# Patient Record
Sex: Male | Born: 1955 | Race: Black or African American | Hispanic: No | Marital: Single | Smoking: Former smoker
Health system: Southern US, Community
[De-identification: ages and names within clinical notes are randomized; demographics above are authoritative.]

## PROBLEM LIST (undated history)

## (undated) DIAGNOSIS — E871 Hypo-osmolality and hyponatremia: Secondary | ICD-10-CM

## (undated) DIAGNOSIS — I10 Essential (primary) hypertension: Secondary | ICD-10-CM

## (undated) DIAGNOSIS — H5789 Other specified disorders of eye and adnexa: Secondary | ICD-10-CM

## (undated) DIAGNOSIS — I639 Cerebral infarction, unspecified: Secondary | ICD-10-CM

## (undated) DIAGNOSIS — E785 Hyperlipidemia, unspecified: Secondary | ICD-10-CM

## (undated) DIAGNOSIS — F1920 Other psychoactive substance dependence, uncomplicated: Secondary | ICD-10-CM

## (undated) DIAGNOSIS — N179 Acute kidney failure, unspecified: Secondary | ICD-10-CM

## (undated) HISTORY — DX: Hypo-osmolality and hyponatremia: E87.1

## (undated) HISTORY — DX: Other psychoactive substance dependence, uncomplicated: F19.20

## (undated) HISTORY — PX: APPENDECTOMY: SHX54

## (undated) HISTORY — DX: Hyperlipidemia, unspecified: E78.5

## (undated) HISTORY — DX: Other specified disorders of eye and adnexa: H57.89

## (undated) HISTORY — DX: Essential (primary) hypertension: I10

## (undated) HISTORY — PX: OTHER SURGICAL HISTORY: SHX169

## (undated) HISTORY — DX: Acute kidney failure, unspecified: N17.9

---

## 2016-04-21 ENCOUNTER — Emergency Department (HOSPITAL_BASED_OUTPATIENT_CLINIC_OR_DEPARTMENT_OTHER)
Admission: EM | Admit: 2016-04-21 | Discharge: 2016-04-21 | Disposition: A | Discharge status: Home / Self Care | Payer: 59 | Attending: Emergency Medicine | Admitting: Emergency Medicine

## 2016-04-21 ENCOUNTER — Encounter (HOSPITAL_BASED_OUTPATIENT_CLINIC_OR_DEPARTMENT_OTHER): Payer: Self-pay

## 2016-04-21 DIAGNOSIS — Y9301 Activity, walking, marching and hiking: Secondary | ICD-10-CM | POA: Insufficient documentation

## 2016-04-21 DIAGNOSIS — R030 Elevated blood-pressure reading, without diagnosis of hypertension: Secondary | ICD-10-CM | POA: Diagnosis not present

## 2016-04-21 DIAGNOSIS — W228XXA Striking against or struck by other objects, initial encounter: Secondary | ICD-10-CM | POA: Insufficient documentation

## 2016-04-21 DIAGNOSIS — Y999 Unspecified external cause status: Secondary | ICD-10-CM | POA: Insufficient documentation

## 2016-04-21 DIAGNOSIS — F17200 Nicotine dependence, unspecified, uncomplicated: Secondary | ICD-10-CM | POA: Insufficient documentation

## 2016-04-21 DIAGNOSIS — S01111A Laceration without foreign body of right eyelid and periocular area, initial encounter: Secondary | ICD-10-CM | POA: Diagnosis not present

## 2016-04-21 DIAGNOSIS — S0591XA Unspecified injury of right eye and orbit, initial encounter: Secondary | ICD-10-CM | POA: Diagnosis present

## 2016-04-21 DIAGNOSIS — Y9289 Other specified places as the place of occurrence of the external cause: Secondary | ICD-10-CM | POA: Insufficient documentation

## 2016-04-21 MED ORDER — TETANUS-DIPHTH-ACELL PERTUSSIS 5-2.5-18.5 LF-MCG/0.5 IM SUSP
0.5000 mL | Freq: Once | INTRAMUSCULAR | Status: AC
  Administered 2016-04-21: 0.5 mL via INTRAMUSCULAR
  Filled 2016-04-21: qty 0.5

## 2016-04-21 NOTE — ED Notes (Signed)
Pt states he hit right eye on basement ceiling 6/18-appeared healed until approx 1 week ago when he had swelling return and d/c-NAD-steady gait

## 2016-04-21 NOTE — Discharge Instructions (Signed)
Laceration Care, Adult Wash the wound daily with soap and water and place a thin layer of bacitracin ointment over the wound. Signs of infection include redness, pain, fever. Return or see your doctor if you feel that your condition is worsening. Your blood pressure should be rechecked within the next 3 weeks. Today's was elevated at 150/108. A laceration is a cut that goes through all of the layers of the skin and into the tissue that is right under the skin. Some lacerations heal on their own. Others need to be closed with stitches (sutures), staples, skin adhesive strips, or skin glue. Proper laceration care minimizes the risk of infection and helps the laceration to heal better. HOW TO CARE FOR YOUR LACERATION If sutures or staples were used:  Keep the wound clean and dry.  If you were given a bandage (dressing), you should change it at least one time per day or as told by your health care provider. You should also change it if it becomes wet or dirty.  Keep the wound completely dry for the first 24 hours or as told by your health care provider. After that time, you may shower or bathe. However, make sure that the wound is not soaked in water until after the sutures or staples have been removed.  Clean the wound one time each day or as told by your health care provider:  Wash the wound with soap and water.  Rinse the wound with water to remove all soap.  Pat the wound dry with a clean towel. Do not rub the wound.  After cleaning the wound, apply a thin layer of antibiotic ointmentas told by your health care provider. This will help to prevent infection and keep the dressing from sticking to the wound.  Have the sutures or staples removed as told by your health care provider. If skin adhesive strips were used:  Keep the wound clean and dry.  If you were given a bandage (dressing), you should change it at least one time per day or as told by your health care provider. You should also  change it if it becomes dirty or wet.  Do not get the skin adhesive strips wet. You may shower or bathe, but be careful to keep the wound dry.  If the wound gets wet, pat it dry with a clean towel. Do not rub the wound.  Skin adhesive strips fall off on their own. You may trim the strips as the wound heals. Do not remove skin adhesive strips that are still stuck to the wound. They will fall off in time. If skin glue was used:  Try to keep the wound dry, but you may briefly wet it in the shower or bath. Do not soak the wound in water, such as by swimming.  After you have showered or bathed, gently pat the wound dry with a clean towel. Do not rub the wound.  Do not do any activities that will make you sweat heavily until the skin glue has fallen off on its own.  Do not apply liquid, cream, or ointment medicine to the wound while the skin glue is in place. Using those may loosen the film before the wound has healed.  If you were given a bandage (dressing), you should change it at least one time per day or as told by your health care provider. You should also change it if it becomes dirty or wet.  If a dressing is placed over the wound, be careful  not to apply tape directly over the skin glue. Doing that may cause the glue to be pulled off before the wound has healed.  Do not pick at the glue. The skin glue usually remains in place for 5-10 days, then it falls off of the skin. General Instructions  Take over-the-counter and prescription medicines only as told by your health care provider.  If you were prescribed an antibiotic medicine or ointment, take or apply it as told by your doctor. Do not stop using it even if your condition improves.  To help prevent scarring, make sure to cover your wound with sunscreen whenever you are outside after stitches are removed, after adhesive strips are removed, or when glue remains in place and the wound is healed. Make sure to wear a sunscreen of at least  30 SPF.  Do not scratch or pick at the wound.  Keep all follow-up visits as told by your health care provider. This is important.  Check your wound every day for signs of infection. Watch for:  Redness, swelling, or pain.  Fluid, blood, or pus.  Raise (elevate) the injured area above the level of your heart while you are sitting or lying down, if possible. SEEK MEDICAL CARE IF:  You received a tetanus shot and you have swelling, severe pain, redness, or bleeding at the injection site.  You have a fever.  A wound that was closed breaks open.  You notice a bad smell coming from your wound or your dressing.  You notice something coming out of the wound, such as wood or glass.  Your pain is not controlled with medicine.  You have increased redness, swelling, or pain at the site of your wound.  You have fluid, blood, or pus coming from your wound.  You notice a change in the color of your skin near your wound.  You need to change the dressing frequently due to fluid, blood, or pus draining from the wound.  You develop a new rash.  You develop numbness around the wound. SEEK IMMEDIATE MEDICAL CARE IF:  You develop severe swelling around the wound.  Your pain suddenly increases and is severe.  You develop painful lumps near the wound or on skin that is anywhere on your body.  You have a red streak going away from your wound.  The wound is on your hand or foot and you cannot properly move a finger or toe.  The wound is on your hand or foot and you notice that your fingers or toes look pale or bluish.   This information is not intended to replace advice given to you by your health care provider. Make sure you discuss any questions you have with your health care provider.   Document Released: 10/02/2005 Document Revised: 02/16/2015 Document Reviewed: 09/28/2014 Elsevier Interactive Patient Education [employer].

## 2016-04-21 NOTE — ED Provider Notes (Signed)
CSN: [id number]     Arrival date & time 04/21/16  1905 History  By signing my name below, I, Dustin Golden, attest that this documentation has been prepared under the direction and in the presence of Orlie Dakin, MD.  Electronically Signed: Reola Golden, ED Scribe. 04/21/2016. 7:37 PM.   Chief Complaint  Patient presents with  . Eye Injury   The history is provided by the patient. No language interpreter was used.   HPI Comments: Dustin Golden is a 60 y.o. male who wears corrective lenses with no pertinent PMHx who presents to the Emergency Department complaining of gradual onset, gradually worsening, intermittent right swelling At right eyebrow at site of laceration onset 1 week PTA. Pt reports that he struck his right eyebrow on on his basement sheet rock ceiling while walking down a flight of stairs on 04/02/16 (approximately 19 days PTA), creating a laceration which was Steri-Stripped by his companion. No LOC. He sustained a wound at the time of the injury, however it has since healed. Pt has associated intermittent brown discharge and itchiness to the site of the injury. He states that the injury to his eyebrow appeared to be healing until ~1 week prior to coming into the ED when he noticed that the swelling to his eyebrow began to worsen. He states that his eyebrow swelling is worse in the morning just after he wakes up, and gradually improves throughout the day. He has applied hydrogen peroxide and applied ice to the area intermittently withPartial relief of his symptoms. He denies eye pain or visual disturbance. Tetanus is not UTD.  History reviewed. No pertinent past medical history. Past Surgical History  Procedure Laterality Date  . Appendectomy    . Gastric ulcer surgery     No family history on file. Social History  Substance Use Topics  . Smoking status: Current Every Day Smoker  . Smokeless tobacco: None  . Alcohol Use: Yes     Comment: weekly    Review of  Systems  Constitutional: Negative.   HENT: Negative.   Eyes: Positive for discharge (brown, right) and itching (right). Negative for pain and visual disturbance.  Respiratory: Negative.   Cardiovascular: Negative.   Gastrointestinal: Negative.   Musculoskeletal: Negative.   Skin: Negative.   Neurological: Negative.   Psychiatric/Behavioral: Negative.    Allergies  Ibuprofen and Sulfa antibiotics  Home Medications   Prior to Admission medications   Not on File   BP 188/121 mmHg  Pulse 93  Temp(Src) 98.9 F (37.2 C) (Oral)  Resp 18  Ht 6\' 4"  (1.93 m)  Wt 210 lb (95.255 kg)  BMI 25.57 kg/m2  SpO2 98%   Physical Exam  Constitutional: He appears well-developed and well-nourished. No distress.  HENT:  Mild swelling at right eyebrow and right upper eyelid. No tenderness. No redness. No warmth. Otherwise atraumatic  Eyes: Conjunctivae are normal. Pupils are equal, round, and reactive to light.  Neck: Neck supple. No tracheal deviation present. No thyromegaly present.  Cardiovascular: Normal rate and regular rhythm.   No murmur heard. Pulmonary/Chest: Effort normal and breath sounds normal.  Abdominal: Soft. Bowel sounds are normal. He exhibits no distension. There is no tenderness.  Musculoskeletal: Normal range of motion. He exhibits no edema or tenderness.  Neurological: He is alert. Coordination normal.  Skin: Skin is warm and dry. No rash noted.  Psychiatric: He has a normal mood and affect.  Nursing note and vitals reviewed.  ED Course  Procedures (including critical  care time)  DIAGNOSTIC STUDIES: Oxygen Saturation is 98% on RA, normal by my interpretation.   COORDINATION OF CARE: 7:36 PM-Discussed next steps with pt including at home care and icing. Pt is agreeable with the plan.   MDM   Final diagnoses:  None   Visual acuity: 20/25 on right, left, and both eyes No signs of infection.. Wound appears to be well-healing Plan local wound care blood pressure  recheck 3 weeks Diagnosis aspiration to right eyebrow #3 elevated blood pressure  I personally performed the services described in this documentation, which was scribed in my presence. The recorded information has been reviewed and considered.    Orlie Dakin, MD 04/21/16 [phone]

## 2019-06-04 DIAGNOSIS — L089 Local infection of the skin and subcutaneous tissue, unspecified: Secondary | ICD-10-CM | POA: Insufficient documentation

## 2019-06-04 DIAGNOSIS — B958 Unspecified staphylococcus as the cause of diseases classified elsewhere: Secondary | ICD-10-CM | POA: Insufficient documentation

## 2019-06-04 DIAGNOSIS — S62617A Displaced fracture of proximal phalanx of left little finger, initial encounter for closed fracture: Secondary | ICD-10-CM | POA: Insufficient documentation

## 2020-12-13 ENCOUNTER — Inpatient Hospital Stay (HOSPITAL_COMMUNITY): Payer: Commercial Managed Care - PPO

## 2020-12-13 ENCOUNTER — Emergency Department (HOSPITAL_COMMUNITY): Payer: Commercial Managed Care - PPO

## 2020-12-13 ENCOUNTER — Emergency Department (HOSPITAL_COMMUNITY): Payer: Commercial Managed Care - PPO | Admitting: Anesthesiology

## 2020-12-13 ENCOUNTER — Inpatient Hospital Stay (HOSPITAL_COMMUNITY)
Admission: EM | Admit: 2020-12-13 | Discharge: 2020-12-17 | DRG: 024 | Disposition: A | Discharge status: Rehabilitation Facility | Payer: Commercial Managed Care - PPO | Attending: Neurology | Admitting: Neurology

## 2020-12-13 ENCOUNTER — Encounter (HOSPITAL_COMMUNITY)
Admission: EM | Disposition: A | Discharge status: Rehabilitation Facility | Payer: Self-pay | Source: Home / Self Care | Attending: Neurology

## 2020-12-13 DIAGNOSIS — R471 Dysarthria and anarthria: Secondary | ICD-10-CM | POA: Diagnosis present

## 2020-12-13 DIAGNOSIS — R29722 NIHSS score 22: Secondary | ICD-10-CM | POA: Diagnosis present

## 2020-12-13 DIAGNOSIS — I69351 Hemiplegia and hemiparesis following cerebral infarction affecting right dominant side: Secondary | ICD-10-CM | POA: Diagnosis not present

## 2020-12-13 DIAGNOSIS — I63412 Cerebral infarction due to embolism of left middle cerebral artery: Principal | ICD-10-CM | POA: Diagnosis present

## 2020-12-13 DIAGNOSIS — I639 Cerebral infarction, unspecified: Secondary | ICD-10-CM | POA: Diagnosis present

## 2020-12-13 DIAGNOSIS — R4701 Aphasia: Secondary | ICD-10-CM | POA: Diagnosis present

## 2020-12-13 DIAGNOSIS — R7303 Prediabetes: Secondary | ICD-10-CM | POA: Diagnosis present

## 2020-12-13 DIAGNOSIS — Z8711 Personal history of peptic ulcer disease: Secondary | ICD-10-CM

## 2020-12-13 DIAGNOSIS — Z882 Allergy status to sulfonamides status: Secondary | ICD-10-CM

## 2020-12-13 DIAGNOSIS — G931 Anoxic brain damage, not elsewhere classified: Secondary | ICD-10-CM | POA: Diagnosis present

## 2020-12-13 DIAGNOSIS — F1410 Cocaine abuse, uncomplicated: Secondary | ICD-10-CM | POA: Diagnosis present

## 2020-12-13 DIAGNOSIS — I5042 Chronic combined systolic (congestive) and diastolic (congestive) heart failure: Secondary | ICD-10-CM | POA: Diagnosis present

## 2020-12-13 DIAGNOSIS — Z20822 Contact with and (suspected) exposure to covid-19: Secondary | ICD-10-CM | POA: Diagnosis present

## 2020-12-13 DIAGNOSIS — I63512 Cerebral infarction due to unspecified occlusion or stenosis of left middle cerebral artery: Secondary | ICD-10-CM | POA: Diagnosis not present

## 2020-12-13 DIAGNOSIS — N179 Acute kidney failure, unspecified: Secondary | ICD-10-CM | POA: Diagnosis present

## 2020-12-13 DIAGNOSIS — S0003XA Contusion of scalp, initial encounter: Secondary | ICD-10-CM | POA: Diagnosis not present

## 2020-12-13 DIAGNOSIS — I161 Hypertensive emergency: Secondary | ICD-10-CM | POA: Diagnosis present

## 2020-12-13 DIAGNOSIS — F1010 Alcohol abuse, uncomplicated: Secondary | ICD-10-CM | POA: Diagnosis present

## 2020-12-13 DIAGNOSIS — I674 Hypertensive encephalopathy: Secondary | ICD-10-CM | POA: Diagnosis present

## 2020-12-13 DIAGNOSIS — I6523 Occlusion and stenosis of bilateral carotid arteries: Secondary | ICD-10-CM | POA: Diagnosis present

## 2020-12-13 DIAGNOSIS — S80211A Abrasion, right knee, initial encounter: Secondary | ICD-10-CM | POA: Diagnosis not present

## 2020-12-13 DIAGNOSIS — F1920 Other psychoactive substance dependence, uncomplicated: Secondary | ICD-10-CM | POA: Diagnosis not present

## 2020-12-13 DIAGNOSIS — R29710 NIHSS score 10: Secondary | ICD-10-CM | POA: Diagnosis not present

## 2020-12-13 DIAGNOSIS — E871 Hypo-osmolality and hyponatremia: Secondary | ICD-10-CM | POA: Diagnosis not present

## 2020-12-13 DIAGNOSIS — I160 Hypertensive urgency: Secondary | ICD-10-CM | POA: Diagnosis present

## 2020-12-13 DIAGNOSIS — W1839XA Other fall on same level, initial encounter: Secondary | ICD-10-CM | POA: Diagnosis not present

## 2020-12-13 DIAGNOSIS — Z7982 Long term (current) use of aspirin: Secondary | ICD-10-CM

## 2020-12-13 DIAGNOSIS — Z886 Allergy status to analgesic agent status: Secondary | ICD-10-CM

## 2020-12-13 DIAGNOSIS — I110 Hypertensive heart disease with heart failure: Secondary | ICD-10-CM | POA: Diagnosis present

## 2020-12-13 DIAGNOSIS — G8191 Hemiplegia, unspecified affecting right dominant side: Secondary | ICD-10-CM | POA: Diagnosis present

## 2020-12-13 DIAGNOSIS — R29810 Facial weakness: Secondary | ICD-10-CM | POA: Diagnosis present

## 2020-12-13 DIAGNOSIS — E785 Hyperlipidemia, unspecified: Secondary | ICD-10-CM | POA: Diagnosis present

## 2020-12-13 DIAGNOSIS — H5789 Other specified disorders of eye and adnexa: Secondary | ICD-10-CM | POA: Diagnosis not present

## 2020-12-13 DIAGNOSIS — R069 Unspecified abnormalities of breathing: Secondary | ICD-10-CM

## 2020-12-13 DIAGNOSIS — R451 Restlessness and agitation: Secondary | ICD-10-CM | POA: Diagnosis present

## 2020-12-13 DIAGNOSIS — F17210 Nicotine dependence, cigarettes, uncomplicated: Secondary | ICD-10-CM | POA: Diagnosis present

## 2020-12-13 DIAGNOSIS — I6300 Cerebral infarction due to thrombosis of unspecified precerebral artery: Secondary | ICD-10-CM | POA: Diagnosis not present

## 2020-12-13 DIAGNOSIS — F1210 Cannabis abuse, uncomplicated: Secondary | ICD-10-CM | POA: Diagnosis present

## 2020-12-13 DIAGNOSIS — Y900 Blood alcohol level of less than 20 mg/100 ml: Secondary | ICD-10-CM | POA: Diagnosis present

## 2020-12-13 DIAGNOSIS — R718 Other abnormality of red blood cells: Secondary | ICD-10-CM | POA: Diagnosis present

## 2020-12-13 DIAGNOSIS — R1313 Dysphagia, pharyngeal phase: Secondary | ICD-10-CM | POA: Diagnosis present

## 2020-12-13 DIAGNOSIS — I6389 Other cerebral infarction: Secondary | ICD-10-CM | POA: Diagnosis not present

## 2020-12-13 DIAGNOSIS — G460 Middle cerebral artery syndrome: Secondary | ICD-10-CM | POA: Diagnosis present

## 2020-12-13 DIAGNOSIS — I10 Essential (primary) hypertension: Secondary | ICD-10-CM | POA: Diagnosis not present

## 2020-12-13 DIAGNOSIS — Y92230 Patient room in hospital as the place of occurrence of the external cause: Secondary | ICD-10-CM | POA: Diagnosis not present

## 2020-12-13 HISTORY — PX: IR CT HEAD LTD: IMG2386

## 2020-12-13 HISTORY — DX: Essential (primary) hypertension: I10

## 2020-12-13 HISTORY — PX: RADIOLOGY WITH ANESTHESIA: SHX6223

## 2020-12-13 HISTORY — PX: IR PERCUTANEOUS ART THROMBECTOMY/INFUSION INTRACRANIAL INC DIAG ANGIO: IMG6087

## 2020-12-13 HISTORY — PX: IR US GUIDE VASC ACCESS RIGHT: IMG2390

## 2020-12-13 LAB — COMPREHENSIVE METABOLIC PANEL
ALT: 16 U/L (ref 0–44)
AST: 22 U/L (ref 15–41)
Albumin: 4 g/dL (ref 3.5–5.0)
Alkaline Phosphatase: 75 U/L (ref 38–126)
Anion gap: 10 (ref 5–15)
BUN: 17 mg/dL (ref 8–23)
CO2: 22 mmol/L (ref 22–32)
Calcium: 9.2 mg/dL (ref 8.9–10.3)
Chloride: 107 mmol/L (ref 98–111)
Creatinine, Ser: 1.28 mg/dL — ABNORMAL HIGH (ref 0.61–1.24)
GFR, Estimated: >60 mL/min (ref >60)
Glucose, Bld: 117 mg/dL — ABNORMAL HIGH (ref 70–99)
Potassium: 4 mmol/L (ref 3.5–5.1)
Sodium: 139 mmol/L (ref 135–145)
Total Bilirubin: 0.8 mg/dL (ref 0.3–1.2)
Total Protein: 7.2 g/dL (ref 6.5–8.1)

## 2020-12-13 LAB — I-STAT CHEM 8, ED
BUN: 18 mg/dL (ref 8–23)
Calcium, Ion: 1.19 mmol/L (ref 1.15–1.40)
Chloride: 106 mmol/L (ref 98–111)
Creatinine, Ser: 1.3 mg/dL — ABNORMAL HIGH (ref 0.61–1.24)
Glucose, Bld: 115 mg/dL — ABNORMAL HIGH (ref 70–99)
HCT: 44 % (ref 39.0–52.0)
Hemoglobin: 15 g/dL (ref 13.0–17.0)
Potassium: 4 mmol/L (ref 3.5–5.1)
Sodium: 142 mmol/L (ref 135–145)
TCO2: 22 mmol/L (ref 22–32)

## 2020-12-13 LAB — URINALYSIS, ROUTINE W REFLEX MICROSCOPIC
Bilirubin Urine: NEGATIVE
Glucose, UA: NEGATIVE mg/dL
Hgb urine dipstick: NEGATIVE
Ketones, ur: NEGATIVE mg/dL
Leukocytes,Ua: NEGATIVE
Nitrite: NEGATIVE
Protein, ur: NEGATIVE mg/dL
Specific Gravity, Urine: 1.019 (ref 1.005–1.030)
pH: 7 (ref 5.0–8.0)

## 2020-12-13 LAB — RPR: RPR Ser Ql: NONREACTIVE

## 2020-12-13 LAB — ECHOCARDIOGRAM COMPLETE
Area-P 1/2: 3.21 cm2
Calc EF: 34.4 %
S' Lateral: 4.5 cm
Single Plane A2C EF: 41.9 %
Single Plane A4C EF: 26.1 %
Weight: 3114.66 oz

## 2020-12-13 LAB — DIFFERENTIAL
Abs Immature Granulocytes: 0.01 10*3/uL (ref 0.00–0.07)
Basophils Absolute: 0.1 10*3/uL (ref 0.0–0.1)
Basophils Relative: 1 %
Eosinophils Absolute: 0.1 10*3/uL (ref 0.0–0.5)
Eosinophils Relative: 1 %
Immature Granulocytes: 0 %
Lymphocytes Relative: 37 %
Lymphs Abs: 3 10*3/uL (ref 0.7–4.0)
Monocytes Absolute: 0.5 10*3/uL (ref 0.1–1.0)
Monocytes Relative: 7 %
Neutro Abs: 4.4 10*3/uL (ref 1.7–7.7)
Neutrophils Relative %: 54 %

## 2020-12-13 LAB — RESP PANEL BY RT-PCR (FLU A&B, COVID) ARPGX2
Influenza A by PCR: NEGATIVE
Influenza B by PCR: NEGATIVE
SARS Coronavirus 2 by RT PCR: NEGATIVE

## 2020-12-13 LAB — HEMOGLOBIN A1C
Hgb A1c MFr Bld: 6.8 % — ABNORMAL HIGH (ref 4.8–5.6)
Mean Plasma Glucose: 148.46 mg/dL

## 2020-12-13 LAB — CBC
HCT: 43.3 % (ref 39.0–52.0)
Hemoglobin: 14.4 g/dL (ref 13.0–17.0)
MCH: 26.2 pg (ref 26.0–34.0)
MCHC: 33.3 g/dL (ref 30.0–36.0)
MCV: 78.9 fL — ABNORMAL LOW (ref 80.0–100.0)
Platelets: 213 10*3/uL (ref 150–400)
RBC: 5.49 MIL/uL (ref 4.22–5.81)
RDW: 15.1 % (ref 11.5–15.5)
WBC: 8.1 10*3/uL (ref 4.0–10.5)
nRBC: 0 % (ref 0.0–0.2)

## 2020-12-13 LAB — LIPID PANEL
Cholesterol: 179 mg/dL (ref 0–200)
HDL: 54 mg/dL (ref >40)
LDL Cholesterol: 109 mg/dL — ABNORMAL HIGH (ref 0–99)
Total CHOL/HDL Ratio: 3.3 RATIO
Triglycerides: 81 mg/dL (ref <150)
VLDL: 16 mg/dL (ref 0–40)

## 2020-12-13 LAB — GLUCOSE, CAPILLARY
Glucose-Capillary: 137 mg/dL — ABNORMAL HIGH (ref 70–99)
Glucose-Capillary: 99 mg/dL (ref 70–99)
Glucose-Capillary: 97 mg/dL (ref 70–99)
Glucose-Capillary: 116 mg/dL — ABNORMAL HIGH (ref 70–99)
Glucose-Capillary: 89 mg/dL (ref 70–99)

## 2020-12-13 LAB — TSH: TSH: 2.834 u[IU]/mL (ref 0.350–4.500)

## 2020-12-13 LAB — RAPID URINE DRUG SCREEN, HOSP PERFORMED
Amphetamines: NOT DETECTED
Barbiturates: NOT DETECTED
Benzodiazepines: NOT DETECTED
Cocaine: POSITIVE — AB
Opiates: NOT DETECTED
Tetrahydrocannabinol: POSITIVE — AB

## 2020-12-13 LAB — APTT: aPTT: 28 seconds (ref 24–36)

## 2020-12-13 LAB — MRSA PCR SCREENING: MRSA by PCR: NEGATIVE

## 2020-12-13 LAB — PROTIME-INR
INR: 0.9 (ref 0.8–1.2)
Prothrombin Time: 12.2 seconds (ref 11.4–15.2)

## 2020-12-13 LAB — ETHANOL: Alcohol, Ethyl (B): <10 mg/dL (ref <10)

## 2020-12-13 LAB — SEDIMENTATION RATE: Sed Rate: 7 mm/hr (ref 0–16)

## 2020-12-13 LAB — HIV ANTIBODY (ROUTINE TESTING W REFLEX): HIV Screen 4th Generation wRfx: NONREACTIVE

## 2020-12-13 LAB — CBG MONITORING, ED: Glucose-Capillary: 117 mg/dL — ABNORMAL HIGH (ref 70–99)

## 2020-12-13 LAB — AMMONIA: Ammonia: 21 umol/L (ref 9–35)

## 2020-12-13 SURGERY — IR WITH ANESTHESIA
Anesthesia: General

## 2020-12-13 MED ORDER — ACETAMINOPHEN 160 MG/5ML PO SOLN: 650.0000 mg | ORAL | Status: DC | PRN

## 2020-12-13 MED ORDER — FOLIC ACID 5 MG/ML IJ SOLN
1.0000 mg | Freq: Every day | INTRAMUSCULAR | Status: DC
  Administered 2020-12-13: 1 mg via INTRAVENOUS
  Filled 2020-12-13 (×3): qty 0.2

## 2020-12-13 MED ORDER — FENTANYL CITRATE (PF) 100 MCG/2ML IJ SOLN
INTRAMUSCULAR | Status: DC | PRN
  Administered 2020-12-13 (×3): 25 ug via INTRAVENOUS

## 2020-12-13 MED ORDER — CLEVIDIPINE BUTYRATE 0.5 MG/ML IV EMUL
INTRAVENOUS | Status: AC
  Filled 2020-12-13: qty 50

## 2020-12-13 MED ORDER — SENNOSIDES-DOCUSATE SODIUM 8.6-50 MG PO TABS: 1.0000 | ORAL_TABLET | Freq: Every evening | ORAL | Status: DC | PRN

## 2020-12-13 MED ORDER — PHENYLEPHRINE HCL-NACL 10-0.9 MG/250ML-% IV SOLN
INTRAVENOUS | Status: DC | PRN
  Administered 2020-12-13: 40 ug/min via INTRAVENOUS

## 2020-12-13 MED ORDER — DOCUSATE SODIUM 100 MG PO CAPS: 100.0000 mg | ORAL_CAPSULE | Freq: Two times a day (BID) | ORAL | Status: DC | PRN

## 2020-12-13 MED ORDER — SODIUM CHLORIDE 0.9% FLUSH
3.0000 mL | Freq: Once | INTRAVENOUS | Status: DC
Start: 2020-12-13 — End: 2020-12-17

## 2020-12-13 MED ORDER — SODIUM CHLORIDE 0.9 % IV SOLN: INTRAVENOUS | Status: DC

## 2020-12-13 MED ORDER — ACETAMINOPHEN 325 MG PO TABS: 650.0000 mg | ORAL_TABLET | ORAL | Status: DC | PRN

## 2020-12-13 MED ORDER — PERFLUTREN LIPID MICROSPHERE
1.0000 mL | INTRAVENOUS | Status: AC | PRN
  Administered 2020-12-13: 2 mL via INTRAVENOUS
  Filled 2020-12-13: qty 10

## 2020-12-13 MED ORDER — CLEVIDIPINE BUTYRATE 0.5 MG/ML IV EMUL
INTRAVENOUS | Status: DC | PRN
  Administered 2020-12-13: 2 mg/h via INTRAVENOUS

## 2020-12-13 MED ORDER — ADULT MULTIVITAMIN W/MINERALS CH
1.0000 | ORAL_TABLET | Freq: Every day | ORAL | Status: DC
  Administered 2020-12-14 – 2020-12-17 (×4): 1 via ORAL
  Filled 2020-12-13 (×4): qty 1

## 2020-12-13 MED ORDER — EPTIFIBATIDE 20 MG/10ML IV SOLN
INTRAVENOUS | Status: AC
  Filled 2020-12-13: qty 10

## 2020-12-13 MED ORDER — FOLIC ACID 1 MG PO TABS
1.0000 mg | ORAL_TABLET | Freq: Every day | ORAL | Status: DC
  Administered 2020-12-14 – 2020-12-17 (×4): 1 mg via ORAL
  Filled 2020-12-13 (×4): qty 1

## 2020-12-13 MED ORDER — VERAPAMIL HCL 2.5 MG/ML IV SOLN
INTRAVENOUS | Status: AC
  Filled 2020-12-13: qty 2

## 2020-12-13 MED ORDER — THIAMINE HCL 100 MG/ML IJ SOLN
100.0000 mg | Freq: Every day | INTRAMUSCULAR | Status: DC
  Administered 2020-12-13: 100 mg via INTRAVENOUS
  Filled 2020-12-13: qty 2

## 2020-12-13 MED ORDER — IOHEXOL 300 MG/ML  SOLN
50.0000 mL | Freq: Once | INTRAMUSCULAR | Status: AC | PRN
  Administered 2020-12-13: 5 mL via INTRA_ARTERIAL

## 2020-12-13 MED ORDER — PHENYLEPHRINE HCL (PRESSORS) 10 MG/ML IV SOLN
INTRAVENOUS | Status: DC | PRN
  Administered 2020-12-13 (×5): 40 ug via INTRAVENOUS

## 2020-12-13 MED ORDER — THIAMINE HCL 100 MG PO TABS: 100.0000 mg | ORAL_TABLET | Freq: Every day | ORAL | Status: DC

## 2020-12-13 MED ORDER — ROCURONIUM BROMIDE 10 MG/ML (PF) SYRINGE
PREFILLED_SYRINGE | INTRAVENOUS | Status: DC | PRN
  Administered 2020-12-13: 50 mg via INTRAVENOUS
  Administered 2020-12-13 (×2): 10 mg via INTRAVENOUS

## 2020-12-13 MED ORDER — FENTANYL CITRATE (PF) 100 MCG/2ML IJ SOLN
INTRAMUSCULAR | Status: AC
  Filled 2020-12-13: qty 2

## 2020-12-13 MED ORDER — POLYETHYLENE GLYCOL 3350 17 G PO PACK: 17.0000 g | PACK | Freq: Every day | ORAL | Status: DC | PRN

## 2020-12-13 MED ORDER — THIAMINE HCL 100 MG PO TABS
100.0000 mg | ORAL_TABLET | Freq: Every day | ORAL | Status: DC
  Administered 2020-12-14 – 2020-12-17 (×4): 100 mg via ORAL
  Filled 2020-12-13 (×4): qty 1

## 2020-12-13 MED ORDER — ACETAMINOPHEN 650 MG RE SUPP: 650.0000 mg | RECTAL | Status: DC | PRN

## 2020-12-13 MED ORDER — CANGRELOR TETRASODIUM 50 MG IV SOLR
INTRAVENOUS | Status: AC
  Filled 2020-12-13: qty 50

## 2020-12-13 MED ORDER — PANTOPRAZOLE SODIUM 40 MG IV SOLR
40.0000 mg | Freq: Every day | INTRAVENOUS | Status: DC
  Administered 2020-12-13 – 2020-12-14 (×2): 40 mg via INTRAVENOUS
  Filled 2020-12-13 (×2): qty 40

## 2020-12-13 MED ORDER — ACETAMINOPHEN 325 MG PO TABS
650.0000 mg | ORAL_TABLET | ORAL | Status: DC | PRN
  Administered 2020-12-15 – 2020-12-16 (×3): 650 mg via ORAL
  Filled 2020-12-13 (×3): qty 2

## 2020-12-13 MED ORDER — SODIUM CHLORIDE 0.9 % IV SOLN: INTRAVENOUS | Status: DC | PRN

## 2020-12-13 MED ORDER — SUGAMMADEX SODIUM 200 MG/2ML IV SOLN
INTRAVENOUS | Status: DC | PRN
  Administered 2020-12-13: 200 mg via INTRAVENOUS

## 2020-12-13 MED ORDER — CLEVIDIPINE BUTYRATE 0.5 MG/ML IV EMUL
0.0000 mg/h | INTRAVENOUS | Status: DC
  Administered 2020-12-13: 4 mg/h via INTRAVENOUS
  Administered 2020-12-13: 2 mg/h via INTRAVENOUS
  Administered 2020-12-13: 7 mg/h via INTRAVENOUS
  Administered 2020-12-13 (×2): 5 mg/h via INTRAVENOUS
  Administered 2020-12-14: 4 mg/h via INTRAVENOUS
  Filled 2020-12-13 (×8): qty 50

## 2020-12-13 MED ORDER — INSULIN ASPART 100 UNIT/ML ~~LOC~~ SOLN
0.0000 [IU] | SUBCUTANEOUS | Status: DC
  Administered 2020-12-15: 7 [IU] via SUBCUTANEOUS
  Administered 2020-12-16: 1 [IU] via SUBCUTANEOUS

## 2020-12-13 MED ORDER — DEXMEDETOMIDINE HCL IN NACL 400 MCG/100ML IV SOLN
0.4000 ug/kg/h | INTRAVENOUS | Status: DC
  Administered 2020-12-13 (×2): 0.4 ug/kg/h via INTRAVENOUS
  Filled 2020-12-13 (×3): qty 100

## 2020-12-13 MED ORDER — IOHEXOL 240 MG/ML SOLN
150.0000 mL | Freq: Once | INTRAMUSCULAR | Status: AC | PRN
  Administered 2020-12-13: 95 mL via INTRA_ARTERIAL

## 2020-12-13 MED ORDER — STROKE: EARLY STAGES OF RECOVERY BOOK
Freq: Once | Status: DC
  Filled 2020-12-13 (×2): qty 1

## 2020-12-13 MED ORDER — FOLIC ACID 1 MG PO TABS: 1.0000 mg | ORAL_TABLET | Freq: Every day | ORAL | Status: DC

## 2020-12-13 MED ORDER — SUCCINYLCHOLINE CHLORIDE 20 MG/ML IJ SOLN
INTRAMUSCULAR | Status: DC | PRN
  Administered 2020-12-13: 120 mg via INTRAVENOUS

## 2020-12-13 MED ORDER — CHLORHEXIDINE GLUCONATE CLOTH 2 % EX PADS
6.0000 | MEDICATED_PAD | Freq: Every day | CUTANEOUS | Status: DC
  Administered 2020-12-14 – 2020-12-17 (×5): 6 via TOPICAL

## 2020-12-13 MED ORDER — IOHEXOL 350 MG/ML SOLN
100.0000 mL | Freq: Once | INTRAVENOUS | Status: AC | PRN
  Administered 2020-12-13: 100 mL via INTRAVENOUS

## 2020-12-13 MED ORDER — IOHEXOL 240 MG/ML SOLN
INTRAMUSCULAR | Status: AC
  Filled 2020-12-13: qty 200

## 2020-12-13 MED ORDER — LORAZEPAM 2 MG/ML IJ SOLN
1.0000 mg | INTRAMUSCULAR | Status: DC | PRN
  Administered 2020-12-13: 2 mg via INTRAVENOUS
  Filled 2020-12-13: qty 1

## 2020-12-13 MED ORDER — SODIUM CHLORIDE 0.9 % IV SOLN
2.0000 ug/kg/min | INTRAVENOUS | Status: DC
  Administered 2020-12-13 – 2020-12-14 (×8): 2 ug/kg/min via INTRAVENOUS
  Filled 2020-12-13 (×14): qty 50

## 2020-12-13 MED ORDER — PROPOFOL 10 MG/ML IV BOLUS
INTRAVENOUS | Status: DC | PRN
  Administered 2020-12-13: 50 mg via INTRAVENOUS
  Administered 2020-12-13: 40 mg via INTRAVENOUS
  Administered 2020-12-13: 150 mg via INTRAVENOUS

## 2020-12-13 MED ORDER — NITROGLYCERIN 1 MG/10 ML FOR IR/CATH LAB
INTRA_ARTERIAL | Status: AC
  Filled 2020-12-13: qty 10

## 2020-12-13 MED ORDER — CANGRELOR BOLUS VIA INFUSION
30.0000 ug/kg | Freq: Once | INTRAVENOUS | Status: AC
  Administered 2020-12-13: 2700 ug via INTRAVENOUS
  Filled 2020-12-13: qty 2700

## 2020-12-13 NOTE — Anesthesia Procedure Notes (Signed)
Arterial Line Insertion Start/End2/28/2022 2:53 AM, 12/13/2020 2:55 AM Performed by: Effie Berkshire, MD, anesthesiologist  Patient location: Pre-op. Preanesthetic checklist: patient identified, IV checked, site marked, risks and benefits discussed, surgical consent, monitors and equipment checked, pre-op evaluation, timeout performed and anesthesia consent Lidocaine 1% used for infiltration Left, radial was placed Catheter size: 20 Fr Hand hygiene performed  and maximum sterile barriers used   Attempts: 1 Procedure performed without using ultrasound guided technique. Following insertion, dressing applied and Biopatch. Post procedure assessment: normal and unchanged  Additional procedure comments: CRNA x2 prior.

## 2020-12-13 NOTE — Anesthesia Preprocedure Evaluation (Addendum)
Anesthesia Evaluation  Patient identified by MRN, date of birth, ID band  Reviewed: Allergy & Precautions, Patient's Chart, lab work & pertinent test results, Unable to perform ROS - Chart review onlyPreop documentation limited or incomplete due to emergent nature of procedure.  Airway Mallampati: II  TM Distance: >3 FB Neck ROM: Full    Dental  (+) Teeth Intact, Chipped,    Pulmonary Current Smoker,    breath sounds clear to auscultation       Cardiovascular  Rhythm:Regular Rate:Normal     Neuro/Psych    GI/Hepatic   Endo/Other    Renal/GU      Musculoskeletal   Abdominal Normal abdominal exam  (+)   Peds  Hematology   Anesthesia Other Findings   Reproductive/Obstetrics                            Anesthesia Physical Anesthesia Plan  ASA: III and emergent  Anesthesia Plan: General   Post-op Pain Management:    Induction: Intravenous, Rapid sequence and Cricoid pressure planned  PONV Risk Score and Plan: 1 and Ondansetron  Airway Management Planned: Oral ETT  Additional Equipment: Arterial line  Intra-op Plan:   Post-operative Plan: Possible Post-op intubation/ventilation  Informed Consent: I have reviewed the patients History and Physical, chart, labs and discussed the procedure including the risks, benefits and alternatives for the proposed anesthesia with the patient or authorized representative who has indicated his/her understanding and acceptance.     History available from chart only and Only emergency history available  Plan Discussed with: CRNA  Anesthesia Plan Comments: (Lab Results      Component                Value               Date                      WBC                      8.1                 12/13/2020                HGB                      15.0                12/13/2020                HCT                      44.0                12/13/2020                 MCV                      78.9 (L)            12/13/2020                PLT                      213                 12/13/2020  Lab Results      Component                Value               Date                      CREATININE               1.30 (H)            12/13/2020                BUN                      18                  12/13/2020                NA                       142                 12/13/2020                K                        4.0                 12/13/2020                CL                       106                 12/13/2020                CO2                      22                  12/13/2020           )       Anesthesia Quick Evaluation

## 2020-12-13 NOTE — Procedures (Signed)
INTERVENTIONAL NEURORADIOLOGY BRIEF POSTPROCEDURE NOTE  DIAGNOSTIC CEREBRAL ANGIOGRAM MECHANICAL THROMBECTOMY LEFT CAROTID STENTING AND ANGIOPLASTY  Attending: Dr. [name]  Assistant: None.  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  Access site: 20F right common femoral artery  Access closure: Perclose proglide  Anesthesia: General  Medication used: refer to anesthesia documentation.  Complications: None  Estimated blood loss: 100 mL  Specimen: None  Findings: Occlusion of the left ICA at the bulb and proximal left M2/MCA middle division branch. Mechanical thrombectomy performed with combine stent retriever and aspiration with complete left MCA recanalization after one pass (TICI3). Catheter retracted into the neck. Carotid bulb patent but stenotic. Delay angiogram showed near reocclusion. Patient loaded on cangrelor. A carotid stent was deployed across the bifurcation with use of cerebral protection device followed by in stent angioplasty. Adequate anterograde flow with no thromboembolic complication.  The patient tolerated the procedure well without incident or complication and is in stable condition.    PLAN: Continue on cangrelor drip until follow-up head CT. Then, transition to ASA + Brilinta.

## 2020-12-13 NOTE — Progress Notes (Signed)
OT Cancellation Note  Patient Details Name: Dustin Golden MRN: [id number] DOB: January 04, 1956   Cancelled Treatment:    Reason Eval/Treat Not Completed: Active bedrest order.   Ramond Dial, OT/L   Acute OT Clinical Specialist Acute Rehabilitation Services Pager [phone] Office [phone]  12/13/2020, 8:49 AM

## 2020-12-13 NOTE — Progress Notes (Signed)
SLP Cancellation Note  Patient Details Name: Dustin Golden MRN: [id number] DOB: 1956-04-07   Cancelled treatment:       Reason Eval/Treat Not Completed: Fatigue/lethargy limiting ability to participate (Pt has returned from MRI, but Alver Fisher, RN reported that the pt is now too lethargic after receiving Ativan from the MRI. SLP will follow up on subsequent date.)  Ahan Eisenberger I. Hardin Negus, [place], [place] Office number [phone] Pager [phone]  Horton Marshall 12/13/2020, 5:43 PM

## 2020-12-13 NOTE — Anesthesia Procedure Notes (Signed)
Procedure Name: Intubation Date/Time: 12/13/2020 2:33 AM Performed by: Suzy Bouchard, CRNA Pre-anesthesia Checklist: Patient identified, Emergency Drugs available, Suction available, Patient being monitored and Timeout performed Patient Re-evaluated:Patient Re-evaluated prior to induction Oxygen Delivery Method: Circle system utilized Preoxygenation: Pre-oxygenation with 100% oxygen Induction Type: Rapid sequence and IV induction Laryngoscope Size: Glidescope and 3 Grade View: Grade II Tube type: Oral Tube size: 7.5 mm Number of attempts: 1 Airway Equipment and Method: Stylet and Video-laryngoscopy Placement Confirmation: ETT inserted through vocal cords under direct vision,  CO2 detector and breath sounds checked- equal and bilateral Secured at: 23 cm Tube secured with: Tape Dental Injury: Teeth and Oropharynx as per pre-operative assessment

## 2020-12-13 NOTE — Transfer of Care (Signed)
Immediate Anesthesia Transfer of Care Note  Patient: Dustin Golden  Procedure(s) Performed: IR WITH ANESTHESIA (N/A )  Patient Location: ICU  Anesthesia Type:General  Level of Consciousness: drowsy, pateint uncooperative and responds to stimulation  Airway & Oxygen Therapy: Patient Spontanous Breathing and Patient connected to face mask oxygen  Post-op Assessment: Report given to RN and Post -op Vital signs reviewed and stable.  Moves left arm and leg more vigorously than right (minimal movement, but some)  Post vital signs: Reviewed and stable  Last Vitals:  Vitals Value Taken Time  BP 187/137 12/13/20 0514  Temp    Pulse 98 12/13/20 0525  Resp 15 12/13/20 0525  SpO2 100 % 12/13/20 0525  Vitals shown include unvalidated device data.  Last Pain: There were no vitals filed for this visit.       Complications: No complications documented.

## 2020-12-13 NOTE — ED Provider Notes (Signed)
[facility] EMERGENCY DEPARTMENT Provider Note   CSN: [id number] Arrival date & time: 12/13/20  0144     History No chief complaint on file.   Dustin Golden is a 65 y.o. male.  The history is provided by the EMS personnel. The history is limited by the condition of the patient.  Cerebrovascular Accident This is a new problem. Episode onset: unknown, last seen 24 hours ago  The problem occurs constantly. The problem has not changed since onset.Nothing aggravates the symptoms. Nothing relieves the symptoms. He has tried nothing for the symptoms. The treatment provided no relief.       No past medical history on file.  There are no problems to display for this patient.   Past Surgical History:  Procedure Laterality Date  . APPENDECTOMY    . gastric ulcer surgery         No family history on file.  Social History   Tobacco Use  . Smoking status: Current Every Day Smoker  Substance Use Topics  . Alcohol use: Yes    Comment: weekly  . Drug use: Yes    Types: Marijuana    Home Medications Prior to Admission medications   Not on File    Allergies    Ibuprofen and Sulfa antibiotics  Review of Systems   Review of Systems  Unable to perform ROS: Acuity of condition  Constitutional: Negative for fever.  Cardiovascular: Negative for leg swelling.  Gastrointestinal: Negative for vomiting.  Skin: Negative for wound.  Neurological: Positive for speech difficulty and weakness.    Physical Exam Updated Vital Signs There were no vitals taken for this visit.  Physical Exam Vitals and nursing note reviewed.  Constitutional:      Appearance: He is not diaphoretic.  HENT:     Head: Normocephalic and atraumatic.     Nose: Nose normal.  Eyes:     Extraocular Movements: Extraocular movements intact.     Conjunctiva/sclera: Conjunctivae normal.  Cardiovascular:     Rate and Rhythm: Normal rate and regular rhythm.     Pulses: Normal pulses.      Heart sounds: Normal heart sounds.  Pulmonary:     Breath sounds: Rhonchi present.  Abdominal:     General: Abdomen is flat. Bowel sounds are normal.     Palpations: Abdomen is soft.     Tenderness: There is no abdominal tenderness. There is no guarding.  Musculoskeletal:     Cervical back: Normal range of motion and neck supple.     Right lower leg: No edema.     Left lower leg: No edema.  Skin:    General: Skin is warm and dry.     Capillary Refill: Capillary refill takes less than 2 seconds.  Neurological:     Mental Status: He is alert.     Deep Tendon Reflexes: Reflexes normal.     ED Results / Procedures / Treatments   Labs (all labs ordered are listed, but only abnormal results are displayed) Results for orders placed or performed during the hospital encounter of 12/13/20  CBC  Result Value Ref Range   WBC 8.1 4.0 - 10.5 K/uL   RBC 5.49 4.22 - 5.81 MIL/uL   Hemoglobin 14.4 13.0 - 17.0 g/dL   HCT 43.3 39.0 - 52.0 %   MCV 78.9 (L) 80.0 - 100.0 fL   MCH 26.2 26.0 - 34.0 pg   MCHC 33.3 30.0 - 36.0 g/dL   RDW 15.1 11.5 -  15.5 %   Platelets 213 150 - 400 K/uL   nRBC 0.0 0.0 - 0.2 %  Differential  Result Value Ref Range   Neutrophils Relative % 54 %   Neutro Abs 4.4 1.7 - 7.7 K/uL   Lymphocytes Relative 37 %   Lymphs Abs 3.0 0.7 - 4.0 K/uL   Monocytes Relative 7 %   Monocytes Absolute 0.5 0.1 - 1.0 K/uL   Eosinophils Relative 1 %   Eosinophils Absolute 0.1 0.0 - 0.5 K/uL   Basophils Relative 1 %   Basophils Absolute 0.1 0.0 - 0.1 K/uL   Immature Granulocytes 0 %   Abs Immature Granulocytes 0.01 0.00 - 0.07 K/uL  I-stat chem 8, ED  Result Value Ref Range   Sodium 142 135 - 145 mmol/L   Potassium 4.0 3.5 - 5.1 mmol/L   Chloride 106 98 - 111 mmol/L   BUN 18 8 - 23 mg/dL   Creatinine, Ser 1.30 (H) 0.61 - 1.24 mg/dL   Glucose, Bld 115 (H) 70 - 99 mg/dL   Calcium, Ion 1.19 1.15 - 1.40 mmol/L   TCO2 22 22 - 32 mmol/L   Hemoglobin 15.0 13.0 - 17.0 g/dL   HCT 44.0  39.0 - 52.0 %  CBG monitoring, ED  Result Value Ref Range   Glucose-Capillary 117 (H) 70 - 99 mg/dL   CT HEAD CODE STROKE WO CONTRAST  Result Date: 12/13/2020 CLINICAL DATA:  Code stroke. Right sided weakness, facial droop and slurred speech. EXAM: CT HEAD WITHOUT CONTRAST TECHNIQUE: Contiguous axial images were obtained from the base of the skull through the vertex without intravenous contrast. COMPARISON:  None. FINDINGS: Brain: No abnormality affects the brainstem or cerebellum. Cerebral hemispheres are normal for age. No advanced atrophy. No sign of old or acute infarction, mass lesion, hemorrhage, hydrocephalus or extra-axial collection. Vascular: No abnormal vascular finding. Skull: Normal Sinuses/Orbits: Clear/normal Other: None ASPECTS ([place] Stroke Program Early CT Score) - Ganglionic level infarction (caudate, lentiform nuclei, internal capsule, insula, M1-M3 cortex): 7 - Supraganglionic infarction (M4-M6 cortex): 3 Total score (0-10 with 10 being normal): 10 IMPRESSION: 1. Normal head CT. 2. Aspects 10. 3. These results were communicated to Dr. Curly Shores at 2:00 amon 2/28/2022by text page via the [facility] messaging system. Electronically Signed   By: Nelson Chimes M.D.   On: 12/13/2020 02:02    EKG None  Radiology CT HEAD CODE STROKE WO CONTRAST  Result Date: 12/13/2020 CLINICAL DATA:  Code stroke. Right sided weakness, facial droop and slurred speech. EXAM: CT HEAD WITHOUT CONTRAST TECHNIQUE: Contiguous axial images were obtained from the base of the skull through the vertex without intravenous contrast. COMPARISON:  None. FINDINGS: Brain: No abnormality affects the brainstem or cerebellum. Cerebral hemispheres are normal for age. No advanced atrophy. No sign of old or acute infarction, mass lesion, hemorrhage, hydrocephalus or extra-axial collection. Vascular: No abnormal vascular finding. Skull: Normal Sinuses/Orbits: Clear/normal Other: None ASPECTS ([place] Stroke Program Early CT Score) -  Ganglionic level infarction (caudate, lentiform nuclei, internal capsule, insula, M1-M3 cortex): 7 - Supraganglionic infarction (M4-M6 cortex): 3 Total score (0-10 with 10 being normal): 10 IMPRESSION: 1. Normal head CT. 2. Aspects 10. 3. These results were communicated to Dr. Curly Shores at 2:00 amon 2/28/2022by text page via the [facility] messaging system. Electronically Signed   By: Nelson Chimes M.D.   On: 12/13/2020 02:02    Procedures Procedures   Medications Ordered in ED Medications  sodium chloride flush (NS) 0.9 % injection 3 mL (has no administration  in time range)    ED Course  I have reviewed the triage vital signs and the nursing notes.  Pertinent labs & imaging results that were available during my care of the patient were reviewed by me and considered in my medical decision making (see chart for details).  JOHNTAVIOUS FRANCOM was evaluated in Emergency Department on 12/13/2020 for the symptoms described in the history of present illness. He was evaluated in the context of the global COVID-19 pandemic, which necessitated consideration that the patient might be at risk for infection with the SARS-CoV-2 virus that causes COVID-19. Institutional protocols and algorithms that pertain to the evaluation of patients at risk for COVID-19 are in a state of rapid change based on information released by regulatory bodies including the CDC and federal and state organizations. These policies and algorithms were followed during the patient's care in the ED.  Final Clinical Impression(s) / ED Diagnoses Stroke, admit to neurology    [place], April, MD 12/13/20 4834

## 2020-12-13 NOTE — Progress Notes (Signed)
STROKE TEAM PROGRESS NOTE   INTERVAL HISTORY His  RN is at the bedside.  Patient is extubated but remains on Precedex for agitation as he has to stay flat for a few more hours.  Is on Cleviprex drip for blood pressure control.  Remains aphasic with severe dysarthria and mild right hemiparesis.  He had successful mechanical thrombectomy of the proximal left M2's/MCA middle division occlusion Dr. Edmon Crape he is on Cangrelor drip for his fresh left carotid stent  Vitals:   12/13/20 1330 12/13/20 1345 12/13/20 1400 12/13/20 1415  BP:   116/67   Pulse: 81 65 66 67  Resp: (!) 21 14 11 18   Temp:      TempSrc:      SpO2: 99% 95% 97% 96%  Weight:       CBC:  Recent Labs  Lab 12/13/20 0151 12/13/20 0200  WBC 8.1  --   NEUTROABS 4.4  --   HGB 14.4 15.0  HCT 43.3 44.0  MCV 78.9*  --   PLT 213  --    Basic Metabolic Panel:  Recent Labs  Lab 12/13/20 0151 12/13/20 0200  NA 139 142  K 4.0 4.0  CL 107 106  CO2 22  --   GLUCOSE 117* 115*  BUN 17 18  CREATININE 1.28* 1.30*  CALCIUM 9.2  --    Lipid Panel:  Recent Labs  Lab 12/13/20 0555  CHOL 179  TRIG 81  HDL 54  CHOLHDL 3.3  VLDL 16  LDLCALC 109*   HgbA1c:  Recent Labs  Lab 12/13/20 0541  HGBA1C 6.8*   Urine Drug Screen:  Recent Labs  Lab 12/13/20 1245  LABOPIA NONE DETECTED  COCAINSCRNUR POSITIVE*  LABBENZ NONE DETECTED  AMPHETMU NONE DETECTED  THCU POSITIVE*  LABBARB NONE DETECTED    Alcohol Level  Recent Labs  Lab 12/13/20 0541  ETH <10    IMAGING past 24 hours CT Code Stroke CTA Head W/WO contrast  Result Date: 12/13/2020 IMPRESSION: Occlusion of the left ICA at the origin. Reconstitution in the siphon probably from a combination of external to internal collaterals and patent communicating arteries. I cannot clearly identify the occluded MCA branch vessel, though there must be one, probably in the M3 region. 16 cc completed infarction in the left frontoparietal junction region. Additional 42 cc  at risk brain surrounding that region. 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 30-50 % stenoses of both vertebral artery origins.  CT Code Stroke CTA Neck W/WO contrast  Result Date: 12/13/2020 IMPRESSION: Occlusion of the left ICA at the origin. Reconstitution in the siphon probably from a combination of external to internal collaterals and patent communicating arteries. I cannot clearly identify the occluded MCA branch vessel, though there must be one, probably in the M3 region. 16 cc completed infarction in the left frontoparietal junction region. Additional 42 cc at risk brain surrounding that region. 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 30-50 % stenoses of both vertebral artery origins. Results discussed with Dr. Curly Shores at approximately 0220 hours Electronically Signed   By: Nelson Chimes M.D.   On: 12/13/2020 02:36   CT Code Stroke Cerebral Perfusion with contrast  Result Date: 12/13/2020 IMPRESSION: Occlusion of the left ICA at the origin. Reconstitution in the siphon probably from a combination of external to internal collaterals and patent communicating arteries. I cannot clearly identify the occluded MCA branch  vessel, though there must be one, probably in the M3 region. 16 cc completed infarction in the left frontoparietal junction region. Additional 42 cc at risk brain surrounding that region. 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 30-50 % stenoses of both vertebral artery origins. Results discussed with Dr. Curly Shores at approximately 0220 hours Electronically Signed   By: Nelson Chimes M.D.   On: 12/13/2020 02:36   CT HEAD CODE STROKE WO CONTRAST  Result Date: 12/13/2020 1. Normal head CT.  2. Aspects 10.   Echo 12/13/2020 1. Left ventricular ejection fraction, by estimation, is 35 to 40%. The  left ventricle has  moderately decreased function. The left ventricle  demonstrates global hypokinesis. There is moderate concentric left  ventricular hypertrophy. Left ventricular  diastolic parameters are consistent with Grade I diastolic dysfunction  (impaired relaxation).  2. Right ventricular systolic function is mildly reduced. The right  ventricular size is mildly enlarged. There is normal pulmonary artery  systolic pressure. The estimated right ventricular systolic pressure is  19.1 mmHg.  3. The mitral valve is normal in structure. Trivial mitral valve  regurgitation. No evidence of mitral stenosis. Moderate mitral annular  calcification.  4. The aortic valve is normal in structure. Aortic valve regurgitation is  not visualized. No aortic stenosis is present.  5. The inferior vena cava is normal in size with greater than 50%  respiratory variability, suggesting right atrial pressure of 3 mmHg.     PHYSICAL EXAM Middle-age African-American male not in distress. . Afebrile. Head is nontraumatic. Neck is supple without bruit.    Cardiac exam no murmur or gallop. Lungs are clear to auscultation. Distal pulses are well felt. Neurological Exam :  Patient is drowsy but can be aroused.  He has moderate expressive aphasia and can speak only a few words and occasional short sentences.  Follows simple midline and one-step commands.  He has left gaze preference but can look to the right to midline.  Blinks to threat on the left and on the right.  Right lower facial weakness.  Tongue midline.  Motor system exam shows good purposeful antigravity movements on the left.  Right upper extremity.  Fingers have some withdrawal.  Able to move right lower extremity partially against gravity.  Tone is diminished on the right compared to left.  Left plantar downgoing equivocal.  Gait not tested ASSESSMENT/PLAN Dustin Golden is a 65 y.o. male with history of tobacco abuse, alcohol use, drug use presenting with aphasia and  right sided hemiparesis.   CT Angio head showed a left ICA occlusion at the bulb and proximal left MCA/M2 middle division branch occlusion. NIHSS 22 initially subsequently 10. s/p thrombectomy with combine stent retriever and aspiration with complete left MCA recanalization after one pass (TICI3). Delay angiogram showed near reocclusion. Patient loaded on cangrelor. A carotid stent was deployed across the bifurcation with use of cerebral protection device followed by in stent angioplasty.   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 ASPECTS 10.     CTA head & neck occlusion of the left ICA  CT perfusion:16 cc completed infarction in the left frontoparietal junction region. Additional 42 cc at risk brain  surrounding that region.  MRI  Pending  MRA  Pending  2D Echo: EF: 30-40%, left ventricle  demonstrates global hypokinesis, LVH, Grade 1 diastolic dysfunction  LDL 109  HgbA1c  6.8  VTE prophylaxis - SCDs only for now  Took occasional aspirin for pain prior to admission, now on Cangrelor x24 hours s/p thrombectomy  Will likely need 30 day event monitor on discharge if no arrythmias captured   Therapy recommendations:  pending  Disposition:  pending  Hypertension  Home meds:  none  Unstable, requiring cleviprex to maintain goals  Transition to oral therapy after he is cleared by speech  SBP 120 - 140 for 24 hours . Long-term BP goal normotensive  Hyperlipidemia  Home meds:  none,   LDL 109, goal < 70  Add simvastatin once patient passes swallow eval   High intensity statin    Continue statin at discharge  Pre Diabetes   Home meds:  none  HgbA1c 6.8, goal < 7.0  CBGs Recent Labs    12/13/20 0524 12/13/20 0804 12/13/20 1114  GLUCAP 137* 99 97      SSI  Other Stroke Risk Factors  Cigarette smoker advised to stop smoking  ETOH use, alcohol level <10, advised to  drink no more than 1-2 drink(s) a day  Substance abuse - UDS:  THC, Cocaine. Patient advised to stop using due to stroke risk.   Congestive heart failure  Other Active Problems  Alcohol abuse  Folic acid, thiamine, and MVI  Hospital day # 0  I have personally obtained history,examined this patient, reviewed notes, independently viewed imaging studies, participated in medical decision making and plan of care.ROS completed by me personally and pertinent positives fully documented  I have made any additions or clarifications directly to the above note. Agree with note above.  Patient presented with left MCA occlusion secondary to symptomatic proximal left carotid stenosis and underwent successful mechanical thrombectomy followed by rescue left carotid angioplasty stenting.  Continue close neurological follow-up and strict blood pressure control pulses systolic goal [id number] for 24 hours.  Continue IV Cangrelor drip for 24 hours and then patient able to swallow change to aspirin and Brilinta.  Check MRI scan of the brain later today.  Continue Precedex post sedation and will consult critical care medicine.  No family available at the bedside for discussion.  Discussed with Dr. Vaughan Browner critical care medicine This patient is critically ill and at significant risk of neurological worsening, death and care requires constant monitoring of vital signs, hemodynamics,respiratory and cardiac monitoring, extensive review of multiple databases, frequent neurological assessment, discussion with family, other specialists and medical decision making of high complexity.I have made any additions or clarifications directly to the above note.This critical care time does not reflect procedure time, or teaching time or supervisory time of PA/NP/Med Resident etc but could involve care discussion time.  I spent 30 minutes of neurocritical care time  in the care of  this patient.     Antony Contras, MD Medical Director [place] Pager: [phone] 12/13/2020 4:57 PM   To contact Stroke Continuity provider, please refer to [email or web address] After hours, contact General Neurology

## 2020-12-13 NOTE — H&P (Addendum)
Neurology H&P Reason for Consult: Right sided weakness  CC: Right sided weakness  History is obtained from: EMS   HPI: Dustin Golden is a 65 y.o. male with past medical history significant for ongoing tobacco abuse, alcohol use and drug use (marijuana and possibly other substances) per EMS report per bystander report  There is very limited history available --no history available for patient with date of birth 09/22/1957, very little history for patient with birthdate Feb 28, 1956  Patient is densely aphasic. Family is unreachable. Per EMS report bystander was quite upset and unable to give much history.  Initial examination was consistent with a full left MCA syndrome (NIH 22 documented below) just prior to CT scan. Dry head CT aspects 10, on my read I appreciated possible left MCA hyperdense vessel sign. CTA revealed occlusion of the left ICA at the origin and intracranially, with likely M3 occlusion distally. On reevaluation of the patient after scan just prior to intubation for emergent thrombectomy the NIH score was 10 as documented below.  LKW: Unknown tPA given?: No, due to unclear last known well IA performed?: Yes Premorbid modified rankin scale: Presumed to 0-1 based on information available at the time ROS: Unable to obtain due to altered mental status.   No past medical history on file. Unable to assess secondary to patient's mental status   No family history on file. Unable to assess secondary to patient's mental status   Social History:  reports that he has been smoking. He does not have any smokeless tobacco history on file. He reports current alcohol use. He reports current drug use. Drug: Marijuana.  Exam: Vitals:  Initial blood pressures for EMS were 160/104, improved to SBP of 200s just prior to thrombectomy. Normal rate and regular rhythm, saturation excellent on room air.   Physical Exam  Constitutional: Appears well-developed and well-nourished.  Psych: Affect  appropriate to situation, initially flat, as his receptive aphasia improved more frustrated Eyes: No scleral injection HENT: No oropharyngeal obstruction.  MSK: no joint deformities.  Cardiovascular: Normal rate and regular rhythm.  Respiratory: Effort normal, non-labored breathing GI: Soft.  No distension. There is no tenderness.  Skin: Warm dry and intact visible skin  Neuro: Mental Status: Patient is awake, alert, initially not following any commands but no usable speech. Later able to say make some simple statements such as "I need to use the bathroom" or "I know that" but unable to name or repeat; was able to follow simple commands but with errors (for example touching his ear instead of his nose) Initially neglect of the right side but later attended to it appropriately Cranial Nerves: II: Visual Fields are initially with right hemianopia, later blink to threat bilaterally. Pupils are equal, round, and reactive to light. 4 to 2 mm III,IV, VI: EOMI without initial left gaze preference, resolved on later testing V: Facial sensation Unable to assess secondary to patient's mental status  VII: Facial movement initially with a lower facial droop, later nearly a complete facial droop with weak eye closure and weak eyebrow raise VIII: hearing is intact to voice XII: tongue is midline without atrophy or fasciculations.  Motor: Tone was initially increased on the right arm and leg, with some slight movement but not antigravity. Later he was able to maintain the right lower extremity up against gravity for over 5 seconds and the right upper extremity had some mild drift. No drift on the left side, arm or leg Sensory: Initially scored NIH 1 for reduced responsiveness  to noxious stim on the right side. Later patient reported he could not feel anything on the right side but could feel on the left Cerebellar: Initially unable to assess secondary to mental status. Later intact within limits of  weakness  NIHSS total 22 initially, 10 subsequently Pertinent positives: Initial, subsequent LOC questions: 2, 2 LOC commands: 2, 0 Best gaze: 1, 0 Visual fields: 2, 0 Facial paresis 2, 3 Right arm weakness: 3, 1 Right leg weakness: 3, 0 Sensory deficit: 1, 2 Best language: 2, 1 Dysarthria: 2, 1 Extinction/inattention 1, 0  I have reviewed labs in epic and the results pertinent to this consultation are: Cr 1.28, GFR > 60, normal CBC other than mild microcytosis (78.9).   I have personally reviewed the images obtained: Dry head CT aspects 10, on my read I appreciated possible left MCA hyperdense vessel sign.  CTA revealed occlusion of the left ICA at the origin and intracranially, with likely M3 occlusion distally. CT perfusion demonstrated tissue at risk in the distal left MCA territory 58 cc, with likely 16 mL of core infarct  Please see radiology reports for full details and incidental findings  Impression: This is a 65 year old man with unknown past medical history presenting with left MCA stroke for which he was taken emergently to thrombectomy given severe/disabling symptoms with potential to progress to life-ending stroke.  Recommendations: # L MCA stroke, athero versus cardioembolic - Stroke labs TSH, ESR, RPR, HgbA1c, fasting lipid panel - MRI brain and MRA head 24 hours - Frequent neuro checks - Echocardiogram - Carotid dopplers - Prophylactic therapy-Antiplatelet med: Aspirin - dose 339m PO or 3030mPR, followed by 81 mg daily if not contraindicated due to complications from procedure - Additional antiplatelets per interventional radiology as needed - Risk factor modification - Telemetry monitoring; 30 day event monitor on discharge if no arrythmias captured  - Blood pressure goal   - Post successful uncomplicated revascularization SBP 120 - 140 for 24 hours; if complications have arisen or only partial revascularization reach out to interventionalist or neurologist  on call for BP goal - PT consult, OT consult, Speech consult, unless patient is back to baseline - Admitted to stroke team  Assessment:  Plan:  Acute Ischemic Stroke TIA  Cerebral infarction due to embolism of right middle cerebral artery Cerebral infarction due to embolism of left middle cerebral artery   Cerebral infarction due to embolism of right anterior cerebral artery Cerebral infarction due to embolism of left anterior cerebral artery   Cerebral infarction due to embolism of right posterior cerebral artery  Cerebral infarction due to embolism of left posterior cerebral artery   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  Cerebral infarction due to embolism of left cerebellar artery  Cerebral infarction due to embolism of unspecified cerebellar artery   Cerebral infarction due to embolism of other cerebral artery  Cerebral infarction due to thrombosis of basilar artery  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 -Hyperglycemia management per SSI to maintain glucose 140-18050mL. 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 affecting left dominant side  Hemiplegia and hemiparesis following cerebral infarction affecting right non-dominant side  Hemiplegia and hemiparesis following cerebral infarction affecting left non-dominant side  -PT/OT -PM&R consult  Toxic encephalopathy Anoxic encephalopathy -Correct metabolic causes -Monitor  RESP Acute Respiratory Failure  -vent management per ICU -wean when able  CV Hypertensive encephalopathy Essential (primary) hypertension Hypertensive Emergency Hypertensive Urgency -Aggressive BP control, goal SBP <  -Titrate oral agents  Heart failure, unspecified Acute systolic (congestive) heart failure  Chronic systolic (congestive) heart failure  Acute on chronic systolic (congestive) heart failure  -TTE -Continue BB -Cards Consult  Acute MI NSTEMI -Cards consult  Hyperlipidemia, unspecified  - Statin for goal LDL < 70  Paroxysmal atrial fibrillation Chronic atrial fibrillation -Rate control -Continue BB -Repeat CT in 2 weeks for consideration of starting anticoagulation if CT is stable.  Coumadin with goal INR 2-3 and stop  antiplatelet once INR >2, provide Coumadin teaching.   HEME Iron Deficiency Anemia Blood Loss Anemia Anemia in Chronic Diseases -Monitor -transfuse for hgb < 7  Coagulopathy secondary to anticoagulation Thrombocytopenia -Goal INR is  -Reverse with [place] -Transfuse platelets -Trend PT/PTT/INR -TEG  ENDO Type 2 diabetes mellitus w/o complications. Type 2 diabetes mellitus with hyperglycemia  -SSI -Start oral meds -goal HgbA1c < 7  GI/GU ESRD Acute Kidney Failure CKD Stage 1 (GFR>90) CKD Stage 2 (GFR 60-89) CKD Stage 3 (GFR 30-59) CKD Stage 4 (GFR 15-29) CKD Stage 5 (GFR < 15) -Continue dialysis -Gentle hydration -avoid nephrotoxic agents -renal consult  Fluid/Electrolyte Disorders Hyper OR Hypo Potassium  Hyper OR Hypo  Calcium Hyper OR Hypo Magnesium  Hyper OR Hypo Phosphorus  -Replete -Repeat labs -Trend -Per dialysis  ID Possible Aspiration PNA -CXR -NPO -Monitor  Possible UTI -Foley catheter present on arrival -CX pending  Possible Sepsis -Pan cultures pending -Start ABX  Nutrition E66.9 Obesity  E46 Protein-Calorie Malnutrition Mild Moderate Severe -diet consult  Prophylaxis DVT: SCDS, SubQ heparin if no significant procedural hemorrhage GI: Pantoprazole Bowel: Senna  Diet: NPO until cleared by speech  Code Status: Full Code (presumed given no history available)   THE FOLLOWING WERE PRESENT ON ADMISSION: CNS -  Acute Ischemic Stroke, Hypertensive Encephalopathy, Hemiparesis,  Respiratory - Possible Aspiration Pneumonia given severe dysarthria  Cardiovascular - Hypertensive Emergency/Urgency,   Lesleigh Noe MD-PhD [facility] [phone]  Total critical care time: 65 minutes   Critical care time was exclusive of separately billable procedures and treating other patients. Critical care was necessary to treat or prevent imminent or life-threatening deterioration. Critical care was time spent personally by me on the following activities: development of treatment plan with patient and/or surrogate as well as nursing, discussions with consultants/primary team, evaluation of patient's response to treatment, examination of patient, obtaining history from patient or surrogate, ordering and performing treatments and interventions, ordering and review of laboratory studies, ordering and review of radiographic studies, and re-evaluation of patient's condition as needed, as documented above.     Addendum: An additional 35 minutes of care was spent and updating the family and obtaining additional collateral once contact information was found.  Daughter Dustin Golden is the primary decision maker and is able to provide a more coherent  history  She notes that her  father called her around 12:45 PM and his speech was very difficult to understand.  However when she asked if he needed help he was able to reply yes and when she asked him if he thought he was having a stroke he was able to reply yes.  Because of the daughter lives out of state in [place], she called her aunt (the patient's sister) to activate EMS.  I did actually speak to this sister as well but the history she provided was somewhat jumbled and occasionally self contradictory likely due to the immense stress of the situation.  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  He does drink she drinks 1 beer daily, after work.  She knows he smokes marijuana but is not concerned about any other substance use.  She does note that this is a particularly stressful time for their family and for her father as he raised his stepson who passed away on 12/25/2018 due to an accidental overdose.  Regarding goals of care, daughter is identified by other family members as well as herself as the patient's primary decision maker.  She clarifies that Dustin Golden initially listed in the chart is the patient's ex-wife.  She confirms that that he would want all aggressive measures at this time but that if he was to have a very substantial decline he would not want to live in a vegetative state.  Given that she lives out of state she has asked that if she is not reachable the local family is updated in her stead.   All of her questions were answered and she reported that she would be driving up to [place] today to visit with her father.

## 2020-12-13 NOTE — Code Documentation (Signed)
Stroke Response Nurse Documentation Code Documentation  Dustin Golden is a 65 y.o. male arriving to [facility] ED via [facility] on 2/28 with past medical hx of tobacco abuse. Code stroke was activated by EMS. Patient from home where he was LKW at unknown and now complaining of right weakness and slurred speech . On No antithrombotic. Stroke team at the bedside on patient arrival. Labs drawn and patient cleared for CT by Dr. Randal Buba. Patient to CT with team. NIHSS 21, see documentation for details and code stroke times. Patient with disoriented, not following commands, right hemianopia, right facial droop, right arm weakness, right leg weakness, right decreased sensation, Expressive aphasia , dysarthria  and Sensory  neglect on exam. The following imaging was completed:  CTA Head and Neck.  Patient is not a candidate for tPA due to Out of window. Pt to IR for possible revascularization. Handoff report to [employer].    Madelynn Done  Rapid Response RN

## 2020-12-13 NOTE — ED Triage Notes (Signed)
Pt arrives to ED BIB [facility] as a Code Stroke. Per EMS Pt called his sister and sister called EMS stating pt "sounded confused" Pt lives alone and LKW is unknown.  Per EMS pt has Rt sided weakness, Slurred Speech and Rt sided facial droop. EDP and Neurologist at the bridge upon pts arrival.   BP 159/104 HR 78 O2 100% 2L [employer] CBG 107

## 2020-12-13 NOTE — TOC Initial Note (Signed)
Transition of Care [facility]) - Initial/Assessment Note    Patient Details  Name: Dustin Golden MRN: [id number] Date of Birth: 02-24-1956  [facility] of [facility]) CM/SW Contact:    Leeroy Cha, RN Phone Number: 12/13/2020, 9:12 AM  Clinical Narrative:                  65 y.o. male with past medical history significant for ongoing tobacco abuse, alcohol use and drug use (marijuana and possibly other substances) per EMS report per bystander report  There is very limited history available --no history available for patient with date of birth 09/22/1957, very little history for patient with birthdate 1956-06-15  Patient is densely aphasic. Family is unreachable. Per EMS report bystander was quite upset and unable to give much history.  Initial examination was consistent with a full left MCA syndrome (NIH 22 documented below) just prior to CT scan. Dry head CT aspects 10, on my read I appreciated possible left MCA hyperdense vessel sign. CTA revealed occlusion of the left ICA at the origin and intracranially, with likely M3 occlusion distally. On reevaluation of the patient after scan just prior to intubation for emergent thrombectomy the NIH score was 10 as documented below.  LKW: Unknown tPA given?: No, due to unclear last known well IA performed?: Yes Premorbid modified rankin scale: Presumed to 0-1 based on information available at the time ROS: Unable to obtain due to altered mental status.   No past medical history on file. Unable to assess secondary to patient's mental status   No family history on file. Unable to assess secondary to patient's mental status   Social History:  reports that he has been smoking. He does not have any smokeless tobacco history on file. He reports current alcohol use. He reports current drug use. Drug: Marijuana.  Exam: Vitals:  Initial blood pressures for EMS were 160/104, improved to SBP of 200s just prior to thrombectomy. Normal rate and  regular rhythm, saturation excellent on room air.   Physical Exam  Constitutional: Appears well-developed and well-nourished.  Psych: Affect appropriate to situation, initially flat, as his receptive aphasia improved more frustrated Eyes: No scleral injection HENT: No oropharyngeal obstruction.  MSK: no joint deformities.  Cardiovascular: Normal rate and regular rhythm.  Respiratory: Effort normal, non-labored breathing GI: Soft.  No distension. There is no tenderness.  Skin: Warm dry and intact visible skin  Neuro: Mental Status: Patient is awake, alert, initially not following any commands but no usable speech. Later able to say make some simple statements such as "I need to use the bathroom" or "I know that" but unable to name or repeat; was able to follow simple commands but with errors (for example touching his ear instead of his nose) Initially neglect of the right side but later attended to it appropriately PLAN: WILL FOLLOW FOR TOC NEEDS, PATIENT DOES LIVE ALONE. Expected Discharge Plan: [place] Barriers to Discharge: Continued Medical Work up   Patient Goals and CMS Choice        Expected Discharge Plan and Services Expected Discharge Plan: [place]   Discharge Planning Services: CM Consult   Living arrangements for the past 2 months: Single Family Home                                      Prior Living Arrangements/Services Living arrangements for the  past 2 months: Single Family Home Lives with:: Self                   Activities of Daily Living      Permission Sought/Granted                  Emotional Assessment   Attitude/Demeanor/Rapport: Unable to Assess Affect (typically observed): Unable to Assess Orientation: : Fluctuating Orientation (Suspected and/or reported Sundowners) Alcohol / Substance Use: Alcohol Use,Tobacco Use Psych Involvement: No (comment)  Admission diagnosis:  Stroke  [facility]) [I63.9] Acute ischemic left MCA stroke ([place]) [I63.512] Cerebrovascular accident (CVA), unspecified mechanism ([place]) [I63.9] Patient Active Problem List   Diagnosis Date Noted  . Acute ischemic left MCA stroke ([place]) 12/13/2020   PCP:  Patient, No Pcp Per Pharmacy:  No Pharmacies Listed    Social Determinants of Health (SDOH) Interventions    Readmission Risk Interventions No flowsheet data found.

## 2020-12-13 NOTE — Progress Notes (Signed)
  Echocardiogram 2D Echocardiogram has been performed.  Dustin Golden 12/13/2020, 1:33 PM

## 2020-12-13 NOTE — Progress Notes (Signed)
SLP Cancellation Note  Patient Details Name: Dustin Golden MRN: [id number] DOB: 11-06-1955   Cancelled treatment:       Reason Eval/Treat Not Completed: Patient at procedure or test/unavailable (Pt off unit for MRI. SLP will follow up.)  [name] I. Hardin Negus, [place], [place] Office number [phone] Pager [place] 12/13/2020, 3:55 PM

## 2020-12-13 NOTE — Consult Note (Signed)
NAME:  Dustin Golden, MRN:  [id number], DOB:  Jul 02, 1956, LOS: 0 ADMISSION DATE:  12/13/2020, CONSULTATION DATE:  12/13/20 REFERRING MD:  Leonie Man, CHIEF COMPLAINT:  Stroke   Brief History:  65 year old male with past medical history significant for polysubstance abuse who presented as code stroke with aphasia and found to have occlusion of the left ICA.  Taken for thrombectomy and subsequently agitated and hypertensive.  Cleviprex and Precedex were initiated and PCCM consulted  History of Present Illness:  Dustin Golden is a 65 year old male with past medical history significant for polysubstance abuse.  Per EMS report, family member found him unable to speak.  He was naked on the couch on EMS arrival with dense aphasia.  History of substance abuse including tobacco, alcohol, marijuana and possibly other substances.  He was brought into the ED as a code stroke, CTA with occlusion of the left ICA at the origin and likely distal M3 occlusion.  Urine drug screen ordered but not sent.  He was taken for emergent thrombectomy on 2/28.  He was extubated post procedure and admitted to intensive care.  He developed agitation and was started on Precedex as well as Cleviprex for hypertension, therefore PCCM consulted  Past Medical History:  Polysubstance abuse  Significant Hospital Events:  2/28 admit to neurology, s/p emergent thrombectomy  Consults:  PCCM  Procedures:  ETT-for procedure only 2/28  Significant Diagnostic Tests:   2/28 CT head>> no acute findings  2/28 CTA head and neck>>Occlusion of the left ICA at the origin. Reconstitution in the siphon probably from a combination of external to internal collaterals and patent communicating arteries. I cannot clearly identify the occluded MCA branch vessel, though there must be one, probably in the M3 region. 16 cc completed infarction in the left frontoparietal junction region. Additional 42 cc at risk brain surrounding that region. 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  MRI brain pending  Micro Data:  2/28 Covid-19, flu>> negative 2/28 MRSA>> negative  Antimicrobials:    Interim History / Subjective:  On nasal cannula post extubation, sleeping on Precedex, arousable  Objective   Blood pressure 116/65, pulse 71, temperature 98.2 F (36.8 C), resp. rate (!) 9, weight 88.3 kg, SpO2 96 %.        Intake/Output Summary (Last 24 hours) at 12/13/2020 1207 Last data filed at 12/13/2020 1100 Gross per 24 hour  Intake 1624.76 ml  Output 650 ml  Net 974.76 ml   Filed Weights   12/13/20 0310 12/13/20 0600  Weight: 90 kg 88.3 kg   General: Thin male, somnolent but arousable no distress HEENT: MM pink/moist Neuro: Sleeping, arousable to voice, responsive, continued dysarthria but responding to questions with one-word answers.  Moving all extremities, no tremors CV: s1s2 rrr, no m/r/g PULM: Clear bilaterally without distress, no rhonchi or wheezing GI: soft, bsx4 active  Extremities: warm/dry, no edema  Skin: no rashes or lesions  Resolved Hospital Problem list     Assessment & Plan:    Acute embolic left ICA CVA Hypertension S/p emergent thrombectomy P: -Management per neurology, sheath removed and able to come off bedrest this afternoon. -MRI pending -Echo pending -Remains on Cleviprex for BP control, transition to oral therapy after permissive hypertension -Cangrelor  Polysubstance abuse Likely EtOH and marijuana, unclear how much alcohol he typically ingests or timing of last drink P: -Not clearly in severe DTs at this time, wean Precedex and start CIWA protocol -UDS  pending -Supplement thiamine, folic acid, multivitamin   Elevated Creatinine Creatinine mildly elevated at 1.3 unclear baseline P: -Follow renal indices and electrolytes and avoid nephrotoxic medications  Best practice (evaluated daily)  Diet: N.p.o. pending speech  eval Pain/Anxiety/Delirium protocol (if indicated): Precedex, Ativan VAP protocol (if indicated): N/A DVT prophylaxis: SCDs GI prophylaxis: N/A Glucose control: POC glucose checks Mobility: Bedrest Disposition: ICU  Goals of Care:  Last date of multidisciplinary goals of care discussion: Per primary Family and staff present:  Summary of discussion:  Follow up goals of care discussion due:  Code Status: Full code  Labs   CBC: Recent Labs  Lab 12/13/20 0151 12/13/20 0200  WBC 8.1  --   NEUTROABS 4.4  --   HGB 14.4 15.0  HCT 43.3 44.0  MCV 78.9*  --   PLT 213  --     Basic Metabolic Panel: Recent Labs  Lab 12/13/20 0151 12/13/20 0200  NA 139 142  K 4.0 4.0  CL 107 106  CO2 22  --   GLUCOSE 117* 115*  BUN 17 18  CREATININE 1.28* 1.30*  CALCIUM 9.2  --    GFR: CrCl cannot be calculated (Unknown ideal weight.). Recent Labs  Lab 12/13/20 0151  WBC 8.1    Liver Function Tests: Recent Labs  Lab 12/13/20 0151  AST 22  ALT 16  ALKPHOS 75  BILITOT 0.8  PROT 7.2  ALBUMIN 4.0   No results for input(s): LIPASE, AMYLASE in the last 168 hours. Recent Labs  Lab 12/13/20 0544  AMMONIA 21    ABG    Component Value Date/Time   TCO2 22 12/13/2020 0200     Coagulation Profile: Recent Labs  Lab 12/13/20 0151  INR 0.9    Cardiac Enzymes: No results for input(s): CKTOTAL, CKMB, CKMBINDEX, TROPONINI in the last 168 hours.  HbA1C: Hgb A1c MFr Bld  Date/Time Value Ref Range Status  12/13/2020 05:41 AM 6.8 (H) 4.8 - 5.6 % Final    Comment:    (NOTE) Pre diabetes:          5.7%-6.4%  Diabetes:              >6.4%  Glycemic control for   <7.0% adults with diabetes     CBG: Recent Labs  Lab 12/13/20 0150 12/13/20 0524 12/13/20 0804 12/13/20 1114  GLUCAP 117* 137* 99 97    Review of Systems:   Unable to obtain secondary to mental status  Past Medical History:  He,  has no past medical history on file.   Surgical History:   Past  Surgical History:  Procedure Laterality Date  . APPENDECTOMY    . gastric ulcer surgery       Social History:   reports that he has been smoking. He does not have any smokeless tobacco history on file. He reports current alcohol use. He reports current drug use. Drug: Marijuana.   Family History:  His family history is not on file.   Allergies Allergies  Allergen Reactions  . Ibuprofen Other (See Comments)    nosebleed  . Sulfa Antibiotics Other (See Comments)    Blisters to skin with cream     Home Medications  Prior to Admission medications   Not on File     Critical care time: 40 minutes       CRITICAL CARE Performed by: Otilio Carpen Gleason   Total critical care time: 40 minutes  Critical care time was exclusive of separately billable procedures and  treating other patients.  Critical care was necessary to treat or prevent imminent or life-threatening deterioration.  Critical care was time spent personally by me on the following activities: development of treatment plan with patient and/or surrogate as well as nursing, discussions with consultants, evaluation of patient's response to treatment, examination of patient, obtaining history from patient or surrogate, ordering and performing treatments and interventions, ordering and review of laboratory studies, ordering and review of radiographic studies, pulse oximetry and re-evaluation of patient's condition.   Otilio Carpen Gleason, PA-C [facility] See Amion for pager If no response to pager , please call [phone] until 7pm After 7:00 pm call Elink  [phone]?[place]

## 2020-12-13 NOTE — Progress Notes (Signed)
PT Cancellation Note  Patient Details Name: Dustin Golden MRN: [id number] DOB: 1956-08-22   Cancelled Treatment:    Reason Eval/Treat Not Completed: Patient not medically ready  Active bedrest order after IR.   Arby Barrette, PT Pager [phone]  Rexanne Mano 12/13/2020, 12:35 PM

## 2020-12-13 NOTE — Progress Notes (Addendum)
Referring Physician(s): Dr. Lyn Records - Code stroke  Supervising Physician: [name]  Patient Status:  [facility] - In-pt  Chief Complaint: Stroke F/U   Subjective: Smoker. Limited medical history obtained from bystander at scene is alcohol and drug use. Presented to the ED at [facility] by EMS with aphasia with right sided hemiparesis. 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 NIHSS 22 initially subsequently 10. s/p thrombectomy with combine stent retriever and aspiration with complete left MCA recanalization after one pass (TICI3). Catheter retracted into the neck. Carotid bulb patent but stenotic. Delay angiogram showed near reocclusion. Patient loaded on cangrelor. A carotid stent was deployed across the bifurcation with use of cerebral protection device followed by in stent angioplasty. Adequate anterograde flow with no thromboembolic complication ia right femoral approach on 2.26.22 by Dr. Dr Sindy Messing. Patient extubated.drowsy but alert to verbal stimuli, able to follow commands. Spontaneous movement of all 4 extremities. Slight right sided facial droop noted. Aphasia improving but persistent  PERRL bilaterally.  Pulses palpable. Daughter at bedside.   Allergies: Ibuprofen and Sulfa antibiotics  Medications: Prior to Admission medications   Not on File     Vital Signs: BP 109/79   Pulse 73   Temp 98.1 F (36.7 C) (Axillary)   Resp 10   Wt 194 lb 10.7 oz (88.3 kg)   SpO2 96%   BMI 23.70 kg/m   Physical Exam Vitals and nursing note reviewed.  Constitutional:      Appearance: He is well-developed and well-nourished.  HENT:     Head: Normocephalic and atraumatic.  Cardiovascular:     Rate and Rhythm: Normal rate and regular rhythm.  Pulmonary:     Effort: Pulmonary effort is normal.  Musculoskeletal:        General: Normal range of motion.     Cervical back: Normal range of  motion.  Skin:    General: Skin is dry.  Neurological:     Mental Status: He is alert.     Comments: s  Psychiatric:        Mood and Affect: Mood and affect normal.     Imaging: CT Code Stroke CTA Head W/WO contrast  Result Date: 12/13/2020 CLINICAL DATA:  Acute presentation with left MCA syndrome. EXAM: CT ANGIOGRAPHY HEAD AND NECK CT PERFUSION BRAIN TECHNIQUE: Multidetector CT imaging of the head and neck was performed using the standard protocol during bolus administration of intravenous contrast. Multiplanar CT image reconstructions and MIPs were obtained to evaluate the vascular anatomy. Carotid stenosis measurements (when applicable) are obtained utilizing NASCET criteria, using the distal internal carotid diameter as the denominator. Multiphase CT imaging of the brain was performed following IV bolus contrast injection. Subsequent parametric perfusion maps were calculated using RAPID software. CONTRAST:  144mL OMNIPAQUE IOHEXOL 350 MG/ML SOLN COMPARISON:  Head CT earlier tonight. FINDINGS: CTA NECK FINDINGS Aortic arch: Aortic atherosclerotic calcification. Branching pattern is normal without origin stenosis. Right carotid system: Common carotid artery widely patent to the bifurcation. Soft and calcified plaque at the carotid bifurcation and ICA bulb. Minimal diameter at distal bulb is 1 mm or less, with a pronounced web-like stenosis. Flow is present distal to that. This is consistent with an 80% or greater stenosis. Left carotid system: Common carotid artery is patent to the bifurcation region. There is occlusion of the ICA at the origin. ECA is widely patent. No flow seen in the left internal  carotid artery in till the siphon region, where there is reconstitution, probably from a combination of external internal collaterals and flow through a patent anterior and posterior communicating artery. Vertebral arteries: Atherosclerotic plaque at the right vertebral artery origin with 30-50%  stenosis. The vessel is widely patent beyond that through the cervical region. Atherosclerotic plaque at the left vertebral artery origin with a 30-50% stenosis. Vessel is widely patent beyond that through the cervical region. Skeleton: Ordinary cervical spondylosis. Other neck: No mass or lymphadenopathy. Upper chest: Normal Review of the MIP images confirms the above findings CTA HEAD FINDINGS Anterior circulation: Right internal carotid artery is patent through the siphon region without stenosis. Right anterior and middle cerebral vessels appear patent and normal. As noted above, left internal carotid artery reconstitutes in the siphon region probably from a combination of external to internal collaterals and patent communicating arteries. Flow is present in the anterior and middle cerebral vessels. I cannot clearly identify an occluded distal MCA branch. Posterior circulation: Both vertebral arteries are patent through the foramen magnum to the basilar. Some atherosclerotic narrowing and irregularity of the V4 segments, right more than left. No basilar stenosis. Posterior circulation branch vessels show flow. Venous sinuses: Patent and normal. Anatomic variants: None significant. Review of the MIP images confirms the above findings CT Brain Perfusion Findings: ASPECTS: In retrospect, probably 9, with some gray-white differentiation loss in the M4 region. CBF (<30%) Volume: 62mL Perfusion (Tmax>6.0s) volume: 26mL Mismatch Volume: 33mL Infarction Location:Left frontoparietal junction region. 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 Reconstitution in the siphon probably from a combination of external to internal collaterals and patent communicating arteries. I cannot clearly identify the occluded MCA branch vessel, though there must be one, probably in the M3 region. 16 cc completed infarction in the left frontoparietal junction region. Additional 42 cc at risk brain surrounding that region. 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 30-50 % stenoses of both vertebral artery origins. Results discussed with Dr. Curly Shores at approximately 0220 hours Electronically Signed   By: Nelson Chimes M.D.   On: 12/13/2020 02:36   CT Code Stroke CTA Neck W/WO contrast  Result Date: 12/13/2020 CLINICAL DATA:  Acute presentation with left MCA syndrome. EXAM: CT ANGIOGRAPHY HEAD AND NECK CT PERFUSION BRAIN TECHNIQUE: Multidetector CT imaging of the head and neck was performed using the standard protocol during bolus administration of intravenous contrast. Multiplanar CT image reconstructions and MIPs were obtained to evaluate the vascular anatomy. Carotid stenosis measurements (when applicable) are obtained utilizing NASCET criteria, using the distal internal carotid diameter as the denominator. Multiphase CT imaging of the brain was performed following IV bolus contrast injection. Subsequent parametric perfusion maps were calculated using RAPID software. CONTRAST:  115mL OMNIPAQUE IOHEXOL 350 MG/ML SOLN COMPARISON:  Head CT earlier tonight. FINDINGS: CTA NECK FINDINGS Aortic arch: Aortic atherosclerotic calcification. Branching pattern is normal without origin stenosis. Right carotid system: Common carotid artery widely patent to the bifurcation. Soft and calcified plaque at the carotid bifurcation and ICA bulb. Minimal diameter at distal bulb is 1 mm or less, with a pronounced web-like stenosis. Flow is present distal to that. This is consistent with an 80% or greater stenosis. Left carotid system: Common carotid artery is patent to the bifurcation region. There is occlusion of the ICA at the origin. ECA is widely patent. No flow seen in the left internal carotid artery in till the siphon region, where there is reconstitution,  probably from a combination of external internal collaterals and flow through a patent anterior and posterior communicating artery. Vertebral  arteries: Atherosclerotic plaque at the right vertebral artery origin with 30-50% stenosis. The vessel is widely patent beyond that through the cervical region. Atherosclerotic plaque at the left vertebral artery origin with a 30-50% stenosis. Vessel is widely patent beyond that through the cervical region. Skeleton: Ordinary cervical spondylosis. Other neck: No mass or lymphadenopathy. Upper chest: Normal Review of the MIP images confirms the above findings CTA HEAD FINDINGS Anterior circulation: Right internal carotid artery is patent through the siphon region without stenosis. Right anterior and middle cerebral vessels appear patent and normal. As noted above, left internal carotid artery reconstitutes in the siphon region probably from a combination of external to internal collaterals and patent communicating arteries. Flow is present in the anterior and middle cerebral vessels. I cannot clearly identify an occluded distal MCA branch. Posterior circulation: Both vertebral arteries are patent through the foramen magnum to the basilar. Some atherosclerotic narrowing and irregularity of the V4 segments, right more than left. No basilar stenosis. Posterior circulation branch vessels show flow. Venous sinuses: Patent and normal. Anatomic variants: None significant. Review of the MIP images confirms the above findings CT Brain Perfusion Findings: ASPECTS: In retrospect, probably 9, with some gray-white differentiation loss in the M4 region. CBF (<30%) Volume: 41mL Perfusion (Tmax>6.0s) volume: 64mL Mismatch Volume: 31mL Infarction Location:Left frontoparietal junction region. 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 Reconstitution in the siphon probably from a combination of external to internal collaterals and patent communicating arteries. I cannot clearly identify the occluded MCA branch vessel, though there must be one, probably in the M3 region. 16 cc completed infarction in the left frontoparietal  junction region. Additional 42 cc at risk brain surrounding that region. 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 30-50 % stenoses of both vertebral artery origins. Results discussed with Dr. Curly Shores at approximately 0220 hours Electronically Signed   By: Nelson Chimes M.D.   On: 12/13/2020 02:36   CT Code Stroke Cerebral Perfusion with contrast  Result Date: 12/13/2020 CLINICAL DATA:  Acute presentation with left MCA syndrome. EXAM: CT ANGIOGRAPHY HEAD AND NECK CT PERFUSION BRAIN TECHNIQUE: Multidetector CT imaging of the head and neck was performed using the standard protocol during bolus administration of intravenous contrast. Multiplanar CT image reconstructions and MIPs were obtained to evaluate the vascular anatomy. Carotid stenosis measurements (when applicable) are obtained utilizing NASCET criteria, using the distal internal carotid diameter as the denominator. Multiphase CT imaging of the brain was performed following IV bolus contrast injection. Subsequent parametric perfusion maps were calculated using RAPID software. CONTRAST:  13mL OMNIPAQUE IOHEXOL 350 MG/ML SOLN COMPARISON:  Head CT earlier tonight. FINDINGS: CTA NECK FINDINGS Aortic arch: Aortic atherosclerotic calcification. Branching pattern is normal without origin stenosis. Right carotid system: Common carotid artery widely patent to the bifurcation. Soft and calcified plaque at the carotid bifurcation and ICA bulb. Minimal diameter at distal bulb is 1 mm or less, with a pronounced web-like stenosis. Flow is present distal to that. This is consistent with an 80% or greater stenosis. Left carotid system: Common carotid artery is patent to the bifurcation region. There is occlusion of the ICA at the origin. ECA is widely patent. No flow seen in the left internal carotid artery in till the siphon region, where there is reconstitution, probably from a combination of external internal  collaterals and flow  through a patent anterior and posterior communicating artery. Vertebral arteries: Atherosclerotic plaque at the right vertebral artery origin with 30-50% stenosis. The vessel is widely patent beyond that through the cervical region. Atherosclerotic plaque at the left vertebral artery origin with a 30-50% stenosis. Vessel is widely patent beyond that through the cervical region. Skeleton: Ordinary cervical spondylosis. Other neck: No mass or lymphadenopathy. Upper chest: Normal Review of the MIP images confirms the above findings CTA HEAD FINDINGS Anterior circulation: Right internal carotid artery is patent through the siphon region without stenosis. Right anterior and middle cerebral vessels appear patent and normal. As noted above, left internal carotid artery reconstitutes in the siphon region probably from a combination of external to internal collaterals and patent communicating arteries. Flow is present in the anterior and middle cerebral vessels. I cannot clearly identify an occluded distal MCA branch. Posterior circulation: Both vertebral arteries are patent through the foramen magnum to the basilar. Some atherosclerotic narrowing and irregularity of the V4 segments, right more than left. No basilar stenosis. Posterior circulation branch vessels show flow. Venous sinuses: Patent and normal. Anatomic variants: None significant. Review of the MIP images confirms the above findings CT Brain Perfusion Findings: ASPECTS: In retrospect, probably 9, with some gray-white differentiation loss in the M4 region. CBF (<30%) Volume: 85mL Perfusion (Tmax>6.0s) volume: 30mL Mismatch Volume: 28mL Infarction Location:Left frontoparietal junction region. 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 Reconstitution in the siphon probably from a combination of external to internal collaterals and patent communicating arteries. I cannot clearly identify the occluded MCA branch vessel, though there  must be one, probably in the M3 region. 16 cc completed infarction in the left frontoparietal junction region. Additional 42 cc at risk brain surrounding that region. 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 30-50 % stenoses of both vertebral artery origins. Results discussed with Dr. Curly Shores at approximately 0220 hours Electronically Signed   By: Nelson Chimes M.D.   On: 12/13/2020 02:36   CT HEAD CODE STROKE WO CONTRAST  Result Date: 12/13/2020 CLINICAL DATA:  Code stroke. Right sided weakness, facial droop and slurred speech. EXAM: CT HEAD WITHOUT CONTRAST TECHNIQUE: Contiguous axial images were obtained from the base of the skull through the vertex without intravenous contrast. COMPARISON:  None. FINDINGS: Brain: No abnormality affects the brainstem or cerebellum. Cerebral hemispheres are normal for age. No advanced atrophy. No sign of old or acute infarction, mass lesion, hemorrhage, hydrocephalus or extra-axial collection. Vascular: No abnormal vascular finding. Skull: Normal Sinuses/Orbits: Clear/normal Other: None ASPECTS ([place] Stroke Program Early CT Score) - Ganglionic level infarction (caudate, lentiform nuclei, internal capsule, insula, M1-M3 cortex): 7 - Supraganglionic infarction (M4-M6 cortex): 3 Total score (0-10 with 10 being normal): 10 IMPRESSION: 1. Normal head CT. 2. Aspects 10. 3. These results were communicated to Dr. Curly Shores at 2:00 amon 2/28/2022by text page via the [facility] messaging system. Electronically Signed   By: Nelson Chimes M.D.   On: 12/13/2020 02:02    Labs:  CBC: Recent Labs    12/13/20 0151 12/13/20 0200  WBC 8.1  --   HGB 14.4 15.0  HCT 43.3 44.0  PLT 213  --     COAGS: Recent Labs    12/13/20 0151  INR 0.9  APTT 28    BMP: Recent Labs    12/13/20 0151 12/13/20 0200  NA 139 142  K 4.0 4.0  CL 107 106  CO2 22  --  GLUCOSE 117* 115*  BUN 17 18  CALCIUM 9.2  --   CREATININE 1.28* 1.30*   GFRNONAA >60  --     LIVER FUNCTION TESTS: Recent Labs    12/13/20 0151  BILITOT 0.8  AST 22  ALT 16  ALKPHOS 75  PROT 7.2  ALBUMIN 4.0    Assessment and Plan:  65 y.o. male inpatient. Smoker. Limited medical history obtained from bystander at scene is alcohol and drug use. Presented to the ED at [facility] by EMS with aphasia with right sided hemiparesis. 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 NIHSS 22 initially subsequently 10. s/p thrombectomy with combine stent retriever and aspiration with complete left MCA recanalization after one pass (TICI3). Catheter retracted into the neck. Carotid bulb patent but stenotic. Delay angiogram showed near reocclusion. Patient loaded on cangrelor. A carotid stent was deployed across the bifurcation with use of cerebral protection device followed by in stent angioplasty. Adequate anterograde flow with no thromboembolic complication via right femoral approach on 2.26.22 by Dr. Dr Sindy Messing.  Patient extubated.drowsy but alert to verbal stimuli, able to follow commands. Spontaneous movement of all 4 extremities. Slight right sided facial droop noted. Aphasia improving but persistent  PERRL bilaterally.  Pulses palpable..  Right femoral access site is soft with no active bleeding and no appreciable pseudoaneurysm. Dressing is C/D/I.  MR pending. BP goal = [id number] systolic x 24 hours post-procedure. Further plans per neurology/CCM- appreciate and agree with management. NIR to follow.   Electronically Signed: Jacqualine Mau, NP 12/13/2020, 8:15 AM   I spent a total of 15 Minutes at the patient's bedside AND on the patient's hospital floor or unit, greater than 50% of which was counseling/coordinating care for cerebral arteriogram with intervention.

## 2020-12-13 NOTE — ED Notes (Signed)
[phone] sister [phone] mother keeps calling for a update.

## 2020-12-14 ENCOUNTER — Encounter (HOSPITAL_COMMUNITY): Payer: Self-pay | Admitting: Interventional Radiology

## 2020-12-14 ENCOUNTER — Inpatient Hospital Stay (HOSPITAL_COMMUNITY): Payer: Commercial Managed Care - PPO

## 2020-12-14 DIAGNOSIS — I6300 Cerebral infarction due to thrombosis of unspecified precerebral artery: Secondary | ICD-10-CM

## 2020-12-14 HISTORY — PX: IR INTRAVSC STENT CERV CAROTID W/EMB-PROT MOD SED INCL ANGIO: IMG2303

## 2020-12-14 LAB — CBC
HCT: 36.4 % — ABNORMAL LOW (ref 39.0–52.0)
Hemoglobin: 12.6 g/dL — ABNORMAL LOW (ref 13.0–17.0)
MCH: 27 pg (ref 26.0–34.0)
MCHC: 34.6 g/dL (ref 30.0–36.0)
MCV: 78.1 fL — ABNORMAL LOW (ref 80.0–100.0)
Platelets: 201 10*3/uL (ref 150–400)
RBC: 4.66 MIL/uL (ref 4.22–5.81)
RDW: 15.1 % (ref 11.5–15.5)
WBC: 9.4 10*3/uL (ref 4.0–10.5)
nRBC: 0 % (ref 0.0–0.2)

## 2020-12-14 LAB — GLUCOSE, CAPILLARY
Glucose-Capillary: 102 mg/dL — ABNORMAL HIGH (ref 70–99)
Glucose-Capillary: 103 mg/dL — ABNORMAL HIGH (ref 70–99)
Glucose-Capillary: 108 mg/dL — ABNORMAL HIGH (ref 70–99)
Glucose-Capillary: 87 mg/dL (ref 70–99)
Glucose-Capillary: 87 mg/dL (ref 70–99)
Glucose-Capillary: 98 mg/dL (ref 70–99)

## 2020-12-14 LAB — BASIC METABOLIC PANEL
Anion gap: 10 (ref 5–15)
BUN: 9 mg/dL (ref 8–23)
CO2: 20 mmol/L — ABNORMAL LOW (ref 22–32)
Calcium: 8.5 mg/dL — ABNORMAL LOW (ref 8.9–10.3)
Chloride: 108 mmol/L (ref 98–111)
Creatinine, Ser: 0.92 mg/dL (ref 0.61–1.24)
GFR, Estimated: >60 mL/min (ref >60)
Glucose, Bld: 106 mg/dL — ABNORMAL HIGH (ref 70–99)
Potassium: 3.5 mmol/L (ref 3.5–5.1)
Sodium: 138 mmol/L (ref 135–145)

## 2020-12-14 LAB — MAGNESIUM: Magnesium: 2.1 mg/dL (ref 1.7–2.4)

## 2020-12-14 LAB — PHOSPHORUS: Phosphorus: 3.4 mg/dL (ref 2.5–4.6)

## 2020-12-14 MED ORDER — POTASSIUM CHLORIDE 10 MEQ/100ML IV SOLN
10.0000 meq | INTRAVENOUS | Status: AC
  Administered 2020-12-14 (×3): 10 meq via INTRAVENOUS
  Filled 2020-12-14 (×3): qty 100

## 2020-12-14 MED ORDER — TICAGRELOR 90 MG PO TABS
90.0000 mg | ORAL_TABLET | Freq: Two times a day (BID) | ORAL | Status: DC
  Administered 2020-12-14 – 2020-12-17 (×6): 90 mg via ORAL
  Filled 2020-12-14 (×6): qty 1

## 2020-12-14 MED ORDER — NICOTINE 14 MG/24HR TD PT24
14.0000 mg | MEDICATED_PATCH | Freq: Every day | TRANSDERMAL | Status: DC
  Administered 2020-12-14: 14 mg via TRANSDERMAL
  Filled 2020-12-14: qty 1

## 2020-12-14 MED ORDER — AMLODIPINE BESYLATE 5 MG PO TABS
5.0000 mg | ORAL_TABLET | Freq: Every day | ORAL | Status: DC
  Administered 2020-12-14: 5 mg via ORAL
  Filled 2020-12-14: qty 1

## 2020-12-14 MED ORDER — LORAZEPAM 2 MG/ML IJ SOLN
1.0000 mg | INTRAMUSCULAR | Status: AC | PRN
  Administered 2020-12-14: 2 mg via INTRAVENOUS
  Filled 2020-12-14: qty 1

## 2020-12-14 MED ORDER — AMLODIPINE BESYLATE 10 MG PO TABS
10.0000 mg | ORAL_TABLET | Freq: Every day | ORAL | Status: DC
  Administered 2020-12-15 – 2020-12-17 (×3): 10 mg via ORAL
  Filled 2020-12-14 (×3): qty 1

## 2020-12-14 MED ORDER — LORAZEPAM 1 MG PO TABS: 1.0000 mg | ORAL_TABLET | ORAL | Status: AC | PRN

## 2020-12-14 MED ORDER — AMLODIPINE BESYLATE 5 MG PO TABS
5.0000 mg | ORAL_TABLET | Freq: Once | ORAL | Status: AC
  Administered 2020-12-14: 5 mg via ORAL
  Filled 2020-12-14: qty 1

## 2020-12-14 MED ORDER — SIMVASTATIN 20 MG PO TABS
40.0000 mg | ORAL_TABLET | Freq: Every day | ORAL | Status: DC
  Administered 2020-12-14 – 2020-12-16 (×3): 40 mg via ORAL
  Filled 2020-12-14 (×3): qty 2

## 2020-12-14 MED ORDER — ASPIRIN 81 MG PO CHEW
81.0000 mg | CHEWABLE_TABLET | Freq: Every day | ORAL | Status: DC
  Administered 2020-12-14 – 2020-12-17 (×4): 81 mg via ORAL
  Filled 2020-12-14 (×4): qty 1

## 2020-12-14 MED ORDER — HYDRALAZINE HCL 20 MG/ML IJ SOLN
20.0000 mg | Freq: Four times a day (QID) | INTRAMUSCULAR | Status: DC | PRN
  Administered 2020-12-14: 20 mg via INTRAVENOUS
  Filled 2020-12-14: qty 1

## 2020-12-14 NOTE — Progress Notes (Signed)
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 Pt presents with significant functional deficits as noted below, requiring Mod A +2 with functional mobility ( limited to sit - stand and side step) and Max A with LB ADL tasks. Pt is very motivated to participate with therapy and is an excellent CIR candidate. States his girlfriend can assist at DC. Will follow acutely.     12/14/20 1500  OT Visit Information  Last OT Received On 12/14/20  Assistance Needed +2  PT/OT/SLP Co-Evaluation/Treatment Yes  Reason for Co-Treatment Complexity of the patient's impairments (multi-system involvement);For patient/therapist safety;To address functional/ADL transfers  OT goals addressed during session ADL's and self-care  History of Present Illness 65 year old male with past medical history significant for polysubstance abuse who presented as code stroke with aphasia and found to have occlusion of the left ICA.  Taken for thrombectomy left ICA at bulb and proximal left MCA M2 middle division occlusions, along with stent placement of left ICA at bulb stenosis, achieving a TICI 3 revascularization via right femoral approach 12/13/2020. MRI + Acute infarct left posterior MCA distribution involving the posterior insula in the left frontal parietal cortex. Small areas of acute infarct in the frontal lobes bilaterally.  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, petroleum Expressive difficulties  Pain Assessment  Pain Assessment No/denies pain  Cognition  Arousal/Alertness Awake/alert  Behavior During Therapy WFL for tasks assessed/performed  Overall Cognitive Status Impaired/Different from baseline  Area of Impairment Attention;Memory;Following commands;Safety/judgement;Awareness;Problem solving  Current Attention Level Selective  Memory Decreased short-term memory  Following Commands Follows one step commands consistently  Safety/Judgement Decreased awareness of safety;Decreased awareness of deficits  Awareness Emergent  Problem Solving Slow processing;Difficulty sequencing  Upper Extremity Assessment  RUE Deficits / Details ROM overall WFL; 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 Pt attempts to use functionally however unaware of position of hand/arm and has difficulty manipulating objects; at risk for injuring R hand; unable to oppose thumb to digits  RUE Sensation decreased light touch;decreased proprioception  RUE Coordination decreased fine motor;decreased gross motor  Lower Extremity Assessment  Lower Extremity Assessment Defer to PT evaluation  RLE Sensation  (to possibly absent--very delayed responses with 50% accuracy)  Cervical / Trunk Assessment  Cervical / Trunk Assessment Other exceptions (R bias)  Cervical / Trunk Exceptions decreased awareness of midline orientation  ADL  Overall ADL's  Needs assistance/impaired  Eating/Feeding NPO  Grooming Moderate assistance  Upper Body Bathing Moderate assistance;Sitting  Lower Body Bathing Moderate assistance;Sit to/from stand  Upper Body Dressing  Moderate assistance;Sitting  Lower Body Dressing Maximal assistance  Toilet Transfer Moderate assistance;+2 for physical assistance  Toileting - Clothing Manipulation Details (indicate cue type and reason)  Max A; foley  Functional mobility during ADLs Moderate assistance;+2 for physical assistance  Vision- History  Baseline Vision/History Wears glasses  Wears Glasses Distance only  Vision- Assessment  Vision  Assessment? Yes  Eye Alignment [facility]  Alignment/Gaze Preference WDL  Tracking/Visual Pursuits Decreased smoothness of horizontal tracking;Decreased smoothness of vertical tracking  Saccades Additional head turns occurred during testing  Visual Fields Impaired-to be further tested in functional context (? decreased R field)  Perception  Comments will further assess  Praxis  Praxis tested? Deficits  Deficits Limb apraxia  Bed Mobility  Overal bed mobility Needs Assistance  Bed Mobility Rolling;Sidelying to Sit;Sit to Supine  Rolling Min assist  Sidelying to sit Min assist  Sit to supine Min assist  General bed mobility comments assist to RUE to protect from injury as decreased awareness of location  Transfers  Overall transfer level Needs assistance  Equipment used 2 person hand held assist  Transfers Sit to/from Stand  Sit to Stand Mod assist;+2 physical assistance  General transfer comment pt with lean to right and requires support to achieve and maintain midline  Balance  Overall balance assessment Needs assistance  Sitting-balance support No upper extremity supported;Feet supported  Sitting balance-Leahy Scale Poor  Sitting balance - Comments right leaning; can correct with cues  Standing balance support Bilateral upper extremity supported;During functional activity  Standing balance-Leahy Scale Poor  Standing balance comment stood for ~90 seconds with increasing lean to rt with fatigue (vs inattention)  General Comments  General comments (skin integrity, edema, etc.) SBPs 150s down to 110s with changes in position and likely due to RN having just medicated for BP control  OT - End of Session  Equipment Utilized During Treatment Gait belt  Activity Tolerance Patient  tolerated treatment well  Patient left in bed;with call bell/phone within reach;with family/visitor present  Nurse Communication Mobility status  OT Assessment  OT Recommendation/Assessment Patient needs continued OT Services  OT Visit Diagnosis Unsteadiness on feet (R26.81);Other abnormalities of gait and mobility (R26.89);Muscle weakness (generalized) (M62.81);Low vision, both eyes (H54.2);Apraxia (R48.2);Other symptoms and signs involving cognitive function  OT Problem List Decreased strength;Impaired balance (sitting and/or standing);Decreased activity tolerance;Impaired vision/perception;Decreased coordination;Decreased cognition;Decreased safety awareness;Decreased knowledge of use of DME or AE;Impaired UE functional use;Impaired sensation  OT Plan  OT Frequency (ACUTE ONLY) Min 2X/week  OT Treatment/Interventions (ACUTE ONLY) Self-care/ADL training;Therapeutic exercise;Neuromuscular education;DME and/or AE instruction;Therapeutic activities;Cognitive remediation/compensation;Visual/perceptual remediation/compensation;Patient/family education;Balance training  AM-PAC OT "6 Clicks" Daily Activity Outcome Measure (Version 2)  Help from another person eating meals? 1  Help from another person taking care of personal grooming? 2  Help from another person toileting, which includes using toliet, bedpan, or urinal? 2  Help from another person bathing (including washing, rinsing, drying)? 2  Help from another person to put on and taking off regular upper body clothing? 2  Help from another person to put on and taking off regular lower body clothing? 2  6 Click Score 11  OT Recommendation  Recommendations for Other Services Rehab consult  Follow Up Recommendations CIR;Supervision/Assistance - 24 hour  OT Equipment 3 in 1 bedside commode  Individuals Consulted  Consulted and Agree with Results and Recommendations Patient;Family member/caregiver  Family Member Consulted daughter/Mom  Acute Rehab  OT Goals  Patient Stated Goal return to baseline  OT Goal Formulation With patient/family  Time For Goal Achievement 12/28/20  Potential to Achieve Goals Good  OT Time Calculation  OT Start Time (ACUTE ONLY) 1205  OT Stop Time (ACUTE ONLY) 1229  OT Time Calculation (min) 24 min  OT General Charges  $OT Visit 1 Visit  OT Evaluation  $OT Eval Moderate Complexity 1 Mod  Written Expression  Dominant Hand Right  Maurie Boettcher, OT/L   Acute OT Clinical Specialist Acute Rehabilitation Services Pager [phone] Office [phone]

## 2020-12-14 NOTE — Progress Notes (Signed)
Modified Barium Swallow Progress Note  Patient Details  Name: Dustin Golden MRN: [id number] Date of Birth: 1956-03-09  Today's Date: 12/14/2020  Modified Barium Swallow completed.  Full report located under Chart Review in the Imaging Section.  Brief recommendations include the following:  Clinical Impression  Pt presents with moderate oral and mild pharyngeal dysphagia of neurogenic etiology. Right sided oral motor deficits from CVA allowed for frequent right sided anterior spillage, reduced bolus cohesion, right sided oral residuals, and premature spillage of bolus. Pharyngeally pt with reduced timing and efficiency of laryngeal vestibule closure with an instance of trace transient penetration of thins and one instance of trace silent aspiration of thins by cup on initial series. Subsequent trials of thins via cup and straw were without penetration or aspiration (including mixed consistency with barium tablet series). Mild vallecular and pyriform sinsus residuasl noted with POs, clearing with reflexive swallows. Recommend dysphagia 3 (mechanical soft) and thin liquids with meds in puree. SLP to follow up.   Swallow Evaluation Recommendations       SLP Diet Recommendations: Dysphagia 3 (Mech soft) solids;Thin liquid   Liquid Administration via: Cup;Straw   Medication Administration: Whole meds with puree       Compensations: Minimize environmental distractions;Slow rate;Small sips/bites;Lingual sweep for clearance of pocketing;Clear throat intermittently   Postural Changes: Remain semi-upright after after feeds/meals (Comment);Seated upright at 90 degrees   Oral Care Recommendations: Oral care BID        Hayden Rasmussen MA, CCC-SLP Acute Rehabilitation Services   12/14/2020,2:02 PM

## 2020-12-14 NOTE — Progress Notes (Addendum)
NAME:  Dustin Golden, MRN:  [id number], DOB:  [birth year], LOS: 1 ADMISSION DATE:  12/13/2020, CONSULTATION DATE:  12/14/20 REFERRING MD:  Leonie Man, CHIEF COMPLAINT:  Stroke   Brief History:  65 year old male with past medical history significant for polysubstance abuse who presented as code stroke with aphasia and found to have occlusion of the left ICA.  Taken for thrombectomy and subsequently agitated and hypertensive.  Cleviprex and Precedex were initiated and PCCM consulted  History of Present Illness:  Dustin Golden is a 65 year old male with past medical history significant for polysubstance abuse.  Per EMS report, family member found him unable to speak.  He was naked on the couch on EMS arrival with dense aphasia.  History of substance abuse including tobacco, alcohol, marijuana and possibly other substances.  He was brought into the ED as a code stroke, CTA with occlusion of the left ICA at the origin and likely distal M3 occlusion.  Urine drug screen ordered but not sent.  He was taken for emergent thrombectomy on 2/28.  He was extubated post procedure and admitted to intensive care.  He developed agitation and was started on Precedex as well as Cleviprex for hypertension, therefore PCCM consulted.  Past Medical History:  Polysubstance abuse, tobacco abuse  Significant Hospital Events:  2/28 admit to neurology, s/p emergent thrombectomy  Consults:  PCCM  Procedures:  ETT-for procedure only 2/28  Significant Diagnostic Tests:   2/28 CT head>> no acute findings  2/28 CTA head and neck>>Occlusion of the left ICA at the origin. Reconstitution in the siphon probably from a combination of external to internal collaterals and patent communicating arteries. I cannot clearly identify the occluded MCA branch vessel, though there must be one, probably in the M3 region. 16 cc completed infarction in the left frontoparietal junction region. Additional 42 cc at risk brain surrounding that region.  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  2/28 TTE >> 1. Left ventricular ejection fraction, by estimation, is 35 to 40%. The left ventricle has moderately decreased function. The left ventricle demonstrates global hypokinesis. There is moderate concentric left ventricular hypertrophy. Left ventricular  diastolic parameters are consistent with Grade I diastolic dysfunction  (impaired relaxation).  2. Right ventricular systolic function is mildly reduced. The right ventricular size is mildly enlarged. There is normal pulmonary artery systolic pressure. The estimated right ventricular systolic pressure is 09.9 mmHg.  3. The mitral valve is normal in structure. Trivial mitral valve regurgitation. No evidence of mitral stenosis. Moderate mitral annular calcification.  4. The aortic valve is normal in structure. Aortic valve regurgitation is not visualized. No aortic stenosis is present.  5. The inferior vena cava is normal in size with greater than 50% respiratory variability, suggesting right atrial pressure of 3 mmHg.   2/28 MRI/ MRA brain >> 1. Acute infarct left posterior MCA distribution involving the posterior insula in the left frontal parietal cortex. Small areas of acute infarct in the frontal lobes bilaterally. 2. Motion degraded study. 3. MRA demonstrates loss of signal in the right posterior cerebral artery. This vessel was patent on CTA earlier today. Possible artifact versus thrombus. 4. There is moderate stenosis in the middle cerebral artery M1 segment bilaterally. Left internal carotid artery appears widely patent.  Micro Data:  2/28 Covid-19, flu>> negative 2/28 MRSA>> negative  Antimicrobials:  n/a  Interim History / Subjective:  Off precedex since 0600 Still remains on cangrelor and cleviprex awaiting SLP today  No complaints   Objective   Blood pressure 113/83, pulse 79, temperature 97.8 F (36.6 C),  temperature source Oral, resp. rate 12, weight 85.3 kg, SpO2 95 %.        Intake/Output Summary (Last 24 hours) at 12/14/2020 0720 Last data filed at 12/14/2020 0700 Gross per 24 hour  Intake 3249.68 ml  Output 3235 ml  Net 14.68 ml   Filed Weights   12/13/20 0310 12/13/20 0600 12/14/20 0500  Weight: 90 kg 88.3 kg 85.3 kg   General:  Thin adult male sitting upright in bed in NAD HEENT: MM pink/moist, pupils 4/ reactive, anicteric, ongoing dysarthria, right facial droop,  Neuro: Awake, oriented x 4, RUE/ RLE 4/5, LUE/ LLE 5/5 CV: NSR, no murmur PULM:  Non labored, on room air, CTA GI: soft, +bs, NT/ ND, condom cath  Extremities: warm/dry, no edema  Skin: no rashes   UOP 3.2L, ~ 0.6 ml/kg/hr Net +232 ml Afebrile   Resolved Hospital Problem list     Assessment & Plan:   Acute embolic left ICA CVA  Hypertension S/p successful mechanical thrombectomy 2/28 with complete left MCA recanalization with stent placement of left ICA P: - per Neurology and NIR - cangrelor gtt-> transitioning to ASA and brilinta when cleared by speech -MRI 2/28 post procedure questioning thrombus of right posterior cerebral artery which was patent on CTA earlier, questions artifact vs thrombus - further imaging per Neurology  - TTE showing EF 30-40%, LV global hypokinesis, LVH, G1DD - SBP goal 120-140 for 24hr post procedure then transition to normotensive; no BB given positive UDS for cocaine  - serial neuro exams - PT/ OT/ SLP - HA1c 6.8, TSH 2.834, statin therapy per neurology   Polysubstance abuse Likely EtOH and marijuana, unclear how much alcohol he typically ingests or timing of last drink P: - off precedex, transition to CIWA - UDS positive for cocaine and THC on admit, neg ETOH on admit - daily thiamine, folic acid, multivitamin  AKI - unclear baseline P: - great UOP, sCr 1.3-> 0.92 - MIVF 75 ml/hr NS while NPO - Trend BMP / urinary output - Replace electrolytes as indicated - Avoid  nephrotoxic agents, ensure adequate renal perfusion  Best practice (evaluated daily)  Diet: NPO pending SLP Pain/Anxiety/Delirium protocol (if indicated): CIWA VAP protocol (if indicated): N/A DVT prophylaxis: SCDs GI prophylaxis: N/A Glucose control: CBG q4, add SSI if > 180 Mobility: PT/ OT Disposition: ICU while on gtts  Goals of Care:  Last date of multidisciplinary goals of care discussion: Per primary Family and staff present:  Summary of discussion:  Follow up goals of care discussion due:  Code Status: Full code  Labs   CBC: Recent Labs  Lab 12/13/20 0151 12/13/20 0200 12/14/20 0412  WBC 8.1  --  9.4  NEUTROABS 4.4  --   --   HGB 14.4 15.0 12.6*  HCT 43.3 44.0 36.4*  MCV 78.9*  --  78.1*  PLT 213  --  355    Basic Metabolic Panel: Recent Labs  Lab 12/13/20 0151 12/13/20 0200 12/14/20 0412  NA 139 142 138  K 4.0 4.0 3.5  CL 107 106 108  CO2 22  --  20*  GLUCOSE 117* 115* 106*  BUN 17 18 9   CREATININE 1.28* 1.30* 0.92  CALCIUM 9.2  --  8.5*   GFR: CrCl cannot be calculated (Unknown ideal weight.). Recent Labs  Lab 12/13/20 0151 12/14/20 0412  WBC 8.1 9.4    Liver Function Tests:  Recent Labs  Lab 12/13/20 0151  AST 22  ALT 16  ALKPHOS 75  BILITOT 0.8  PROT 7.2  ALBUMIN 4.0   No results for input(s): LIPASE, AMYLASE in the last 168 hours. Recent Labs  Lab 12/13/20 0544  AMMONIA 21    ABG    Component Value Date/Time   TCO2 22 12/13/2020 0200     Coagulation Profile: Recent Labs  Lab 12/13/20 0151  INR 0.9    Cardiac Enzymes: No results for input(s): CKTOTAL, CKMB, CKMBINDEX, TROPONINI in the last 168 hours.  HbA1C: Hgb A1c MFr Bld  Date/Time Value Ref Range Status  12/13/2020 05:41 AM 6.8 (H) 4.8 - 5.6 % Final    Comment:    (NOTE) Pre diabetes:          5.7%-6.4%  Diabetes:              >6.4%  Glycemic control for   <7.0% adults with diabetes     CBG: Recent Labs  Lab 12/13/20 1114 12/13/20 1735  12/13/20 1958 12/14/20 0003 12/14/20 0351  GLUCAP 97 116* 89 87 102*       CRITICAL CARE Performed by: Kennieth Rad   Total critical care time: 18minutes  Critical care time was exclusive of separately billable procedures and treating other patients.  Critical care was necessary to treat or prevent imminent or life-threatening deterioration.  Critical care was time spent personally by me on the following activities: development of treatment plan with patient and/or surrogate as well as nursing, discussions with consultants, evaluation of patient's response to treatment, examination of patient, obtaining history from patient or surrogate, ordering and performing treatments and interventions, ordering and review of laboratory studies, ordering and review of radiographic studies, pulse oximetry and re-evaluation of patient's condition.    Kennieth Rad, ACNP [facility] 12/14/2020, 7:20 AM  See Amion for pager If no response to pager, please call [phone] until 7pm After 7:00 pm call Elink  [phone]

## 2020-12-14 NOTE — Progress Notes (Signed)
Inpatient Rehab Admissions Coordinator Note:   Per therapy recommendations, pt was screened for CIR candidacy by Shann Medal, PT, DPT.  At this time we are recommending a CIR consult and I will place an order per our protocol.  Please contact me with questions.   Shann Medal, PT, DPT [phone] 12/14/20 3:20 PM

## 2020-12-14 NOTE — Discharge Instructions (Addendum)
Follow up with primary care provider within 2-4 weeks for hospital follow up.    Femoral Site Care This sheet gives you information about how to care for yourself after your procedure. Your health care provider may also give you more specific instructions. If you have problems or questions, contact your health care provider. What can I expect after the procedure? After the procedure, it is common to have:  Bruising that usually fades within 1-2 weeks.  Tenderness at the site. Follow these instructions at home: Wound care 1. Follow instructions from your health care provider about how to take care of your insertion site. Make sure you: ? Wash your hands with soap and water before you change your bandage (dressing). If soap and water are not available, use hand sanitizer. ? Change your dressing as directed- pressure dressing removed 24 hours post-procedure (and switch for bandaid), bandaid removed 72 hours post-procedure 2. Do not take baths, swim, or use a hot tub for 7 days post-procedure. 3. You may shower 48 hours after the procedure or as told by your health care provider. ? Gently wash the site with plain soap and water. ? Pat the area dry with a clean towel. ? Do not rub the site. This may cause bleeding. 4. Check your site every day for signs of infection. Check for: ? Redness, swelling, or pain. ? Fluid or blood. ? Warmth. ? Pus or a bad smell. Activity  Do not stoop, bend, or lift anything that is heavier than 10 lb (4.5 kg) for 2 weeks post-procedure.  Do not drive self for 2 weeks post-procedure. Contact a health care provider if you have:  A fever or chills.  You have redness, swelling, or pain around your insertion site. Get help right away if:  The catheter insertion area swells very fast.  You pass out.  You suddenly start to sweat or your skin gets clammy.  The catheter insertion area is bleeding, and the bleeding does not stop when you hold steady pressure  on the area.  The area near or just beyond the catheter insertion site becomes pale, cool, tingly, or numb. These symptoms may represent a serious problem that is an emergency. Do not wait to see if the symptoms will go away. Get medical help right away. Call your local emergency services (911 in the [place] Do not drive yourself to the hospital.  This information is not intended to replace advice given to you by your health care provider. Make sure you discuss any questions you have with your health care provider. Document Revised: 10/15/2017 Document Reviewed: 10/15/2017 Elsevier Patient Education  2020 [employer].

## 2020-12-14 NOTE — Care Management (Signed)
Case manager acknowledges MD consult for [facility] Team. Patient is being screened and evaluated for CIR. We will continue follow and monitor for any change to disposition. Ricki Miller, RN BSN Case Manager

## 2020-12-14 NOTE — Progress Notes (Signed)
STROKE TEAM PROGRESS NOTE   INTERVAL HISTORY Patient is sitting up in bed.  His family is at the bedside.  He continues to have expressive aphasia and dysarthria but speech and language is improved today.  Echocardiogram showed diminished ejection fraction of 35 to 40%.  Urine drug screen is positive for cocaine and marijuana.  MRI shows left insular posterior frontal infarct MRA shows patent middle cerebral artery.  Vitals:   12/14/20 0500 12/14/20 0600 12/14/20 0700 12/14/20 0800  BP: 111/73 101/76 113/83 117/75  Pulse:    73  Resp: 10 20 12 13   Temp:      TempSrc:      SpO2:    99%  Weight: 85.3 kg      CBC:  Recent Labs  Lab 12/13/20 0151 12/13/20 0200 12/14/20 0412  WBC 8.1  --  9.4  NEUTROABS 4.4  --   --   HGB 14.4 15.0 12.6*  HCT 43.3 44.0 36.4*  MCV 78.9*  --  78.1*  PLT 213  --  765   Basic Metabolic Panel:  Recent Labs  Lab 12/13/20 0151 12/13/20 0200 12/14/20 0412  NA 139 142 138  K 4.0 4.0 3.5  CL 107 106 108  CO2 22  --  20*  GLUCOSE 117* 115* 106*  BUN 17 18 9   CREATININE 1.28* 1.30* 0.92  CALCIUM 9.2  --  8.5*   Lipid Panel:  Recent Labs  Lab 12/13/20 0555  CHOL 179  TRIG 81  HDL 54  CHOLHDL 3.3  VLDL 16  LDLCALC 109*   HgbA1c:  Recent Labs  Lab 12/13/20 0541  HGBA1C 6.8*   Urine Drug Screen:  Recent Labs  Lab 12/13/20 1245  LABOPIA NONE DETECTED  COCAINSCRNUR POSITIVE*  LABBENZ NONE DETECTED  AMPHETMU NONE DETECTED  THCU POSITIVE*  LABBARB NONE DETECTED    Alcohol Level  Recent Labs  Lab 12/13/20 0541  ETH <10    IMAGING past 24 hours CT Code Stroke CTA Head W/WO contrast  Result Date: 12/13/2020 IMPRESSION: Occlusion of the left ICA at the origin. Reconstitution in the siphon probably from a combination of external to internal collaterals and patent communicating arteries. I cannot clearly identify the occluded MCA branch vessel, though there must be one, probably in the M3 region. 16 cc completed infarction in the  left frontoparietal junction region. Additional 42 cc at risk brain surrounding that region. 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 30-50 % stenoses of both vertebral artery origins.  CT Code Stroke CTA Neck W/WO contrast  Result Date: 12/13/2020 IMPRESSION: Occlusion of the left ICA at the origin. Reconstitution in the siphon probably from a combination of external to internal collaterals and patent communicating arteries. I cannot clearly identify the occluded MCA branch vessel, though there must be one, probably in the M3 region. 16 cc completed infarction in the left frontoparietal junction region. Additional 42 cc at risk brain surrounding that region. 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 30-50 % stenoses of both vertebral artery origins. Results discussed with Dr. Curly Shores at approximately 0220 hours Electronically Signed   By: Nelson Chimes M.D.   On: 12/13/2020 02:36   CT Code Stroke Cerebral Perfusion with contrast  Result Date: 12/13/2020 IMPRESSION: Occlusion of the left ICA at the origin. Reconstitution in the siphon probably from a combination of external to internal collaterals and patent communicating arteries.  I cannot clearly identify the occluded MCA branch vessel, though there must be one, probably in the M3 region. 16 cc completed infarction in the left frontoparietal junction region. Additional 42 cc at risk brain surrounding that region. 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 30-50 % stenoses of both vertebral artery origins. Results discussed with Dr. Curly Shores at approximately 0220 hours Electronically Signed   By: Nelson Chimes M.D.   On: 12/13/2020 02:36   CT HEAD CODE STROKE WO CONTRAST  Result Date: 12/13/2020 1. Normal head CT.  2. Aspects 10.   Echo 12/13/2020 1. Left ventricular ejection fraction, by  estimation, is 35 to 40%. The  left ventricle has moderately decreased function. The left ventricle  demonstrates global hypokinesis. There is moderate concentric left  ventricular hypertrophy. Left ventricular  diastolic parameters are consistent with Grade I diastolic dysfunction  (impaired relaxation).  2. Right ventricular systolic function is mildly reduced. The right  ventricular size is mildly enlarged. There is normal pulmonary artery  systolic pressure. The estimated right ventricular systolic pressure is  63.1 mmHg.  3. The mitral valve is normal in structure. Trivial mitral valve  regurgitation. No evidence of mitral stenosis. Moderate mitral annular  calcification.  4. The aortic valve is normal in structure. Aortic valve regurgitation is  not visualized. No aortic stenosis is present.  5. The inferior vena cava is normal in size with greater than 50%  respiratory variability, suggesting right atrial pressure of 3 mmHg.     PHYSICAL EXAM Middle-age African-American male not in distress. . Afebrile. Head is nontraumatic. Neck is supple without bruit.    Cardiac exam no murmur or gallop. Lungs are clear to auscultation. Distal pulses are well felt. Neurological Exam :  Patient is awake alert has moderate expressive aphasia with nonfluent speech with word finding difficulties.  There is mild dysarthria but can be understood with some difficulties.  He has good comprehension.  Difficulty with naming and repetition.  Extraocular movements are full range without nystagmus.  Face is asymmetric with mild right lower facial weakness..  Tongue midline.  Motor system exam shows symmetric upper and lower extremity strength without focal weakness.  Sensation intact.  Gait not tested.  ASSESSMENT/PLAN [name] is a 65 y.o. male with history of tobacco abuse, alcohol use, drug use presenting with aphasia and right sided hemiparesis.   CT Angio head showed a left ICA occlusion at the  bulb and proximal left MCA/M2 middle division branch occlusion. NIHSS 22 initially subsequently 10. s/p thrombectomy with combine stent retriever and aspiration with complete left MCA recanalization after one pass (TICI3). Delay angiogram showed near reocclusion. Patient loaded on cangrelor. A carotid stent was deployed across the bifurcation with use of cerebral protection device followed by in stent angioplasty.   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 ASPECTS 10.     CTA head & neck occlusion of the left ICA  CT perfusion:16 cc completed infarction in the left frontoparietal junction region. Additional 42 cc at risk brain  surrounding that region.  MRI  Acute infarct left posterior MCA distribution involving the posterior insula in the left frontal parietal cortex. Small areas of  acute infarct in the frontal lobes bilaterally.  MRA There is moderate stenosis in the middle cerebral artery M1 segment bilaterally. Left internal carotid artery appears widely  patent.  2D  Echo: EF: 30-40%, left ventricle  demonstrates global hypokinesis, LVH, Grade 1 diastolic dysfunction  LDL 109  HgbA1c 6.8  VTE prophylaxis - SCDs only for now  Took occasional aspirin for pain prior to admission, was on Cangrelor x24 hours s/p thrombectomy, will d/c and start aspirin 81 and Brilinta 90bid  Will likely need 30 day event monitor on discharge if no arrythmias captured   Therapy recommendations:  PT CIR, OT pending  Disposition:  Pending  May need 30 day monitor at the time of discharge to evaluate for arrhythmias  Hypertension  Home meds:  none  stable, requiring cleviprex to maintain goals, wean off  Start amlodipine 5mg  daily  Change BP goal <160  Hydralazine PRN . Long-term BP goal normotensive  Dysphagia  Likely related to stroke  MBS today: Dysphagia 3 solids thin  liquids  Hyperlipidemia  Home meds:  none,   LDL 109, goal < 70  Add simvastatin once patient passes swallow eval   High intensity statin    Continue statin at discharge  Pre Diabetes   Home meds:  none  HgbA1c 6.8, goal < 7.0  CBGs Recent Labs    12/14/20 0003 12/14/20 0351 12/14/20 0820  GLUCAP 87 102* 98      SSI  Other Stroke Risk Factors  Cigarette smoker advised to stop smoking  ETOH use, alcohol level <10, advised to drink no more than 1-2 drink(s) a day  Substance abuse - UDS:  THC, Cocaine. Patient advised to stop using due to stroke risk.   Congestive heart failure  Other Active Problems  Alcohol abuse  Folic acid, thiamine, and MVI  Hospital day # 1  Plan mobilize out of bed.  Physical Occupational Therapy consults.  Speech therapy for language and swallowing.  Aspirin and Brilinta for 3 to 6 months given stent followed by aspirin alone.  Discussed with patient and daughter and mother at the bedside and answered questions.  Consider transfer to neurology floor bed tomorrow if stable.This patient is critically ill and at significant risk of neurological worsening, death and care requires constant monitoring of vital signs, hemodynamics,respiratory and cardiac monitoring, extensive review of multiple databases, frequent neurological assessment, discussion with family, other specialists and medical decision making of high complexity.I have made any additions or clarifications directly to the above note.This critical care time does not reflect procedure time, or teaching time or supervisory time of PA/NP/Med Resident etc but could involve care discussion time.  I spent 30 minutes of neurocritical care time  in the care of  this patient.    Antony Contras, MD    To contact Stroke Continuity provider, please refer to [email or web address] After hours, contact General Neurology

## 2020-12-14 NOTE — Evaluation (Signed)
Clinical/Bedside Swallow Evaluation Patient Details  Name: Dustin Golden MRN: [id number] Date of Birth: 1956-02-18  Today's Date: 12/14/2020 Time: SLP Start Time (ACUTE ONLY): 1324 SLP Stop Time (ACUTE ONLY): 1135 SLP Time Calculation (min) (ACUTE ONLY): 12 min  Past Medical History: No past medical history on file. Past Surgical History:  Past Surgical History:  Procedure Laterality Date  . APPENDECTOMY    . gastric ulcer surgery    . RADIOLOGY WITH ANESTHESIA N/A 12/13/2020   Procedure: IR WITH ANESTHESIA;  Surgeon: Luanne Bras, MD;  Location: [facility];  Service: Radiology;  Laterality: N/A;   HPI:  65 y.o. male with past medical history significant for ongoing tobacco abuse, alcohol use and drug use (marijuana and possibly other substances) per EMS report per bystander report. 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 Small areas of acute infarct in the frontal lobes bilaterally. Pt s/p successful mechanical thrombectomy of the proximal left M2's/MCA middle division occlusion.   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 Right sided oral motor deficits included reduced right facial, lingual, and labial strength and ROM as well as decreased sensation. This allowed for right sided anterior spillage with thins via cup without pt awareness and right sided oral residuals. Pt with isolated delayed overt coughing following thins via cup concerning for reduced airway protection. No overt s/sx of aspiration with ice chips or puree consistencies. Recommend proceed with instrumental assessement to objectively assess swallow safety and efficiency. MBSS planned for 1 pm this date. Family, pt and nursing in agreement with plan. Pt okay for ice chips and meds crushed in puree. Defer diet recommendations until MBSS  completion this date.  SLP Visit Diagnosis: Dysphagia, oral phase (R13.11);Dysphagia, unspecified (R13.10)    Aspiration Risk  Moderate aspiration risk;Severe aspiration risk    Diet Recommendation   NPO; okay for ice chips following oral care; MBSS 1 pm this date  Medication Administration: Crushed with puree    Other  Recommendations Oral Care Recommendations: Oral care QID;Oral care prior to ice chip/H20   Follow up Recommendations Other (comment) (TBD)      Frequency and Duration min 2x/week  2 weeks       Prognosis Prognosis for Safe Diet Advancement: Good      Swallow Study   General Date of Onset: 12/13/20 HPI: 65 y.o. male with past medical history significant for ongoing tobacco abuse, alcohol use and drug use (marijuana and possibly other substances) per EMS report per bystander report. 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 Small areas of acute infarct in the frontal lobes bilaterally. Pt s/p successful mechanical thrombectomy of the proximal left M2's/MCA middle division occlusion. Type of Study: Bedside Swallow Evaluation Previous Swallow Assessment: none on file Diet Prior to this Study: NPO Temperature Spikes Noted: No Respiratory Status: Room air History of Recent Intubation: Yes Length of Intubations (days): 1 days Date extubated: 12/13/20 Behavior/Cognition: Alert;Cooperative Oral Care Completed by SLP: Yes Oral Cavity - Dentition: Missing dentition Vision: Functional for self-feeding Self-Feeding Abilities: Needs assist Patient Positioning: Upright in bed Baseline Vocal Quality: Normal Volitional Cough: Strong Volitional Swallow: Able to elicit    Oral/Motor/Sensory Function Overall Oral Motor/Sensory Function: Moderate impairment Facial ROM: Reduced right;Suspected CN VII (facial) dysfunction Facial Symmetry: Abnormal symmetry right;Suspected CN VII  (  facial) dysfunction Facial Strength: Reduced right;Suspected CN VII (facial) dysfunction Facial Sensation: Reduced right;Suspected CN V (Trigeminal) dysfunction Lingual ROM: Reduced right;Suspected CN XII (hypoglossal) dysfunction Lingual Strength: Reduced Lingual Sensation: Reduced   Ice Chips Ice chips: Impaired Presentation: Spoon Oral Phase Impairments: Reduced lingual movement/coordination Oral Phase Functional Implications: Prolonged oral transit Pharyngeal Phase Impairments: Suspected delayed Swallow;Multiple swallows   Thin Liquid Thin Liquid: Impaired Presentation: Cup Oral Phase Impairments: Reduced labial seal Oral Phase Functional Implications: Prolonged oral transit Pharyngeal  Phase Impairments: Suspected delayed Swallow;Multiple swallows;Cough - Delayed    Nectar Thick Nectar Thick Liquid: Not tested   Honey Thick Honey Thick Liquid: Not tested   Puree Puree: Impaired Presentation: Spoon Oral Phase Impairments: Reduced labial seal;Reduced lingual movement/coordination Oral Phase Functional Implications: Right lateral sulci pocketing;Prolonged oral transit Pharyngeal Phase Impairments: Suspected delayed Swallow;Multiple swallows   Solid     Solid: Not tested      Hayden Rasmussen MA, CCC-SLP Acute Rehabilitation Services   12/14/2020,11:49 AM

## 2020-12-14 NOTE — Progress Notes (Addendum)
Referring Physician(s): Code stroke- Bhagat, Srishti L (neurology)  Supervising Physician: [name]  Patient Status:  [facility] - In-pt  Chief Complaint: "Speech"  Subjective:  History of acute CVA s/p cerebral arteriogram with emergent mechanical thrombectomy of left ICA at bulb and proximal left MCA M2 middle division occlusions, along with stent placement of left ICA at bulb stenosis, achieving a TICI 3 revascularization via right femoral approach 12/13/2020 by Dr. Karenann Cai. Patient awake and alert sitting in bed watching TV. Follows simple commands. Speech dysarthric but able to identify simple objects. Can spontaneously move all extremities with weakness of left side. Right femoral puncture site c/d/i.  MR/MRA brain/head 12/13/2020: 1. Acute infarct left posterior MCA distribution involving the posterior insula in the left frontal parietal cortex. Small areas of acute infarct in the frontal lobes bilaterally. 2. Motion degraded study. 3. MRA demonstrates loss of signal in the right posterior cerebral artery. This vessel was patent on CTA earlier today. Possible artifact versus thrombus. 4. There is moderate stenosis in the middle cerebral artery M1 segment bilaterally. Left internal carotid artery appears widely patent.   Allergies: Ibuprofen, Sulfa antibiotics, and Sulfamethoxazole  Medications: Prior to Admission medications   Medication Sig Start Date End Date Taking? Authorizing Provider  BAYER ASPIRIN 325 MG EC tablet Take 325 mg by mouth as needed for pain.   Yes [provider]     Vital Signs: BP 124/75   Pulse 76   Temp 97.8 F (36.6 C) (Oral)   Resp 17   Wt 188 lb 0.8 oz (85.3 kg)   SpO2 100%   BMI 22.89 kg/m   Physical Exam Vitals and nursing note reviewed.  Constitutional:      General: He is not in acute distress. Pulmonary:     Effort: Pulmonary effort is normal. No respiratory distress.  Skin:    General:  Skin is warm and dry.     Comments: Right femoral puncture site soft without active bleeding or hematoma.  Neurological:     Mental Status: He is alert.     Comments: Alert, awake, and oriented x3. Follows simple commands. Speech dysarthric but able to identify simple objects. PERRL bilaterally. No facial asymmetry. Tongue midline. can spontaneously move all extremities with weakness of left side (can lift LUE but cannot hold). No pronator drift. Distal pulses (DPs) palpable bilaterally with Doppler.     Imaging: CT Code Stroke CTA Head W/WO contrast  Result Date: 12/13/2020 CLINICAL DATA:  Acute presentation with left MCA syndrome. EXAM: CT ANGIOGRAPHY HEAD AND NECK CT PERFUSION BRAIN TECHNIQUE: Multidetector CT imaging of the head and neck was performed using the standard protocol during bolus administration of intravenous contrast. Multiplanar CT image reconstructions and MIPs were obtained to evaluate the vascular anatomy. Carotid stenosis measurements (when applicable) are obtained utilizing NASCET criteria, using the distal internal carotid diameter as the denominator. Multiphase CT imaging of the brain was performed following IV bolus contrast injection. Subsequent parametric perfusion maps were calculated using RAPID software. CONTRAST:  144mL OMNIPAQUE IOHEXOL 350 MG/ML SOLN COMPARISON:  Head CT earlier tonight. FINDINGS: CTA NECK FINDINGS Aortic arch: Aortic atherosclerotic calcification. Branching pattern is normal without origin stenosis. Right carotid system: Common carotid artery widely patent to the bifurcation. Soft and calcified plaque at the carotid bifurcation and ICA bulb. Minimal diameter at distal bulb is 1 mm or less, with a pronounced web-like stenosis. Flow is present distal to that. This is consistent with an 80% or  greater stenosis. Left carotid system: Common carotid artery is patent to the bifurcation region. There is occlusion of the ICA at the origin. ECA is widely  patent. No flow seen in the left internal carotid artery in till the siphon region, where there is reconstitution, probably from a combination of external internal collaterals and flow through a patent anterior and posterior communicating artery. Vertebral arteries: Atherosclerotic plaque at the right vertebral artery origin with 30-50% stenosis. The vessel is widely patent beyond that through the cervical region. Atherosclerotic plaque at the left vertebral artery origin with a 30-50% stenosis. Vessel is widely patent beyond that through the cervical region. Skeleton: Ordinary cervical spondylosis. Other neck: No mass or lymphadenopathy. Upper chest: Normal Review of the MIP images confirms the above findings CTA HEAD FINDINGS Anterior circulation: Right internal carotid artery is patent through the siphon region without stenosis. Right anterior and middle cerebral vessels appear patent and normal. As noted above, left internal carotid artery reconstitutes in the siphon region probably from a combination of external to internal collaterals and patent communicating arteries. Flow is present in the anterior and middle cerebral vessels. I cannot clearly identify an occluded distal MCA branch. Posterior circulation: Both vertebral arteries are patent through the foramen magnum to the basilar. Some atherosclerotic narrowing and irregularity of the V4 segments, right more than left. No basilar stenosis. Posterior circulation branch vessels show flow. Venous sinuses: Patent and normal. Anatomic variants: None significant. Review of the MIP images confirms the above findings CT Brain Perfusion Findings: ASPECTS: In retrospect, probably 9, with some gray-white differentiation loss in the M4 region. CBF (<30%) Volume: 45mL Perfusion (Tmax>6.0s) volume: 39mL Mismatch Volume: 102mL Infarction Location:Left frontoparietal junction region. 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 Reconstitution in the siphon probably  from a combination of external to internal collaterals and patent communicating arteries. I cannot clearly identify the occluded MCA branch vessel, though there must be one, probably in the M3 region. 16 cc completed infarction in the left frontoparietal junction region. Additional 42 cc at risk brain surrounding that region. 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 30-50 % stenoses of both vertebral artery origins. Results discussed with Dr. Curly Shores at approximately 0220 hours Electronically Signed   By: Nelson Chimes M.D.   On: 12/13/2020 02:36   CT Code Stroke CTA Neck W/WO contrast  Result Date: 12/13/2020 CLINICAL DATA:  Acute presentation with left MCA syndrome. EXAM: CT ANGIOGRAPHY HEAD AND NECK CT PERFUSION BRAIN TECHNIQUE: Multidetector CT imaging of the head and neck was performed using the standard protocol during bolus administration of intravenous contrast. Multiplanar CT image reconstructions and MIPs were obtained to evaluate the vascular anatomy. Carotid stenosis measurements (when applicable) are obtained utilizing NASCET criteria, using the distal internal carotid diameter as the denominator. Multiphase CT imaging of the brain was performed following IV bolus contrast injection. Subsequent parametric perfusion maps were calculated using RAPID software. CONTRAST:  130mL OMNIPAQUE IOHEXOL 350 MG/ML SOLN COMPARISON:  Head CT earlier tonight. FINDINGS: CTA NECK FINDINGS Aortic arch: Aortic atherosclerotic calcification. Branching pattern is normal without origin stenosis. Right carotid system: Common carotid artery widely patent to the bifurcation. Soft and calcified plaque at the carotid bifurcation and ICA bulb. Minimal diameter at distal bulb is 1 mm or less, with a pronounced web-like stenosis. Flow is present distal to that. This is consistent with an 80% or greater stenosis. Left carotid system: Common carotid artery is patent to  the  bifurcation region. There is occlusion of the ICA at the origin. ECA is widely patent. No flow seen in the left internal carotid artery in till the siphon region, where there is reconstitution, probably from a combination of external internal collaterals and flow through a patent anterior and posterior communicating artery. Vertebral arteries: Atherosclerotic plaque at the right vertebral artery origin with 30-50% stenosis. The vessel is widely patent beyond that through the cervical region. Atherosclerotic plaque at the left vertebral artery origin with a 30-50% stenosis. Vessel is widely patent beyond that through the cervical region. Skeleton: Ordinary cervical spondylosis. Other neck: No mass or lymphadenopathy. Upper chest: Normal Review of the MIP images confirms the above findings CTA HEAD FINDINGS Anterior circulation: Right internal carotid artery is patent through the siphon region without stenosis. Right anterior and middle cerebral vessels appear patent and normal. As noted above, left internal carotid artery reconstitutes in the siphon region probably from a combination of external to internal collaterals and patent communicating arteries. Flow is present in the anterior and middle cerebral vessels. I cannot clearly identify an occluded distal MCA branch. Posterior circulation: Both vertebral arteries are patent through the foramen magnum to the basilar. Some atherosclerotic narrowing and irregularity of the V4 segments, right more than left. No basilar stenosis. Posterior circulation branch vessels show flow. Venous sinuses: Patent and normal. Anatomic variants: None significant. Review of the MIP images confirms the above findings CT Brain Perfusion Findings: ASPECTS: In retrospect, probably 9, with some gray-white differentiation loss in the M4 region. CBF (<30%) Volume: 21mL Perfusion (Tmax>6.0s) volume: 42mL Mismatch Volume: 66mL Infarction Location:Left frontoparietal junction region. IMPRESSION:  Occlusion of the left ICA at the origin. Reconstitution in the siphon probably from a combination of external to internal collaterals and patent communicating arteries. I cannot clearly identify the occluded MCA branch vessel, though there must be one, probably in the M3 region. 16 cc completed infarction in the left frontoparietal junction region. Additional 42 cc at risk brain surrounding that region. 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 30-50 % stenoses of both vertebral artery origins. Results discussed with Dr. Curly Shores at approximately 0220 hours Electronically Signed   By: Nelson Chimes M.D.   On: 12/13/2020 02:36   MR ANGIO HEAD WO CONTRAST  Result Date: 12/13/2020 CLINICAL DATA:  Stroke. Post left internal carotid artery thrombectomy and stenting of left carotid bifurcation. EXAM: MRI HEAD WITHOUT CONTRAST MRA HEAD WITHOUT CONTRAST TECHNIQUE: Multiplanar, multiecho pulse sequences of the brain and surrounding structures were obtained without intravenous contrast. Angiographic images of the head were obtained using MRA technique without contrast. COMPARISON:  CT angio head and neck and CT perfusion 12/13/2020 FINDINGS: MRI HEAD FINDINGS Brain: Acute infarct in the left parietal lobe over the convexity. This extends anteriorly into the operculum and posterior insula. Small areas of acute infarct in the left frontal white matter and cortex and in the right frontal white matter. No associated hemorrhage. Ventricle size normal.  No mass or midline shift. Vascular: Normal arterial flow voids. Skull and upper cervical spine: No focal lesion. Sinuses/Orbits: Paranasal sinuses clear.  Negative orbit Other: Motion degraded study. MRA HEAD FINDINGS Both vertebral arteries patent to the basilar. PICA patent bilaterally. Basilar widely patent. Superior cerebellar arteries patent bilaterally. Left posterior cerebral artery widely patent. Patent left posterior  communicating artery. Fetal origin of the right posterior cerebral artery. Decreased signal in the right P1 and P2 segment. This vessel appeared  patent on CTA earlier today. Question artifact versus thrombus. Internal carotid artery patent bilaterally without stenosis. Focal moderate stenosis left M1 segment. There is good distal flow and right middle cerebral artery branches are patent. Anterior cerebral arteries patent bilaterally. Moderate stenosis right middle cerebral artery. IMPRESSION: 1. Acute infarct left posterior MCA distribution involving the posterior insula in the left frontal parietal cortex. Small areas of acute infarct in the frontal lobes bilaterally. 2. Motion degraded study. 3. MRA demonstrates loss of signal in the right posterior cerebral artery. This vessel was patent on CTA earlier today. Possible artifact versus thrombus. 4. There is moderate stenosis in the middle cerebral artery M1 segment bilaterally. Left internal carotid artery appears widely patent. Electronically Signed   By: Franchot Gallo M.D.   On: 12/13/2020 19:17   MR BRAIN WO CONTRAST  Result Date: 12/13/2020 CLINICAL DATA:  Stroke. Post left internal carotid artery thrombectomy and stenting of left carotid bifurcation. EXAM: MRI HEAD WITHOUT CONTRAST MRA HEAD WITHOUT CONTRAST TECHNIQUE: Multiplanar, multiecho pulse sequences of the brain and surrounding structures were obtained without intravenous contrast. Angiographic images of the head were obtained using MRA technique without contrast. COMPARISON:  CT angio head and neck and CT perfusion 12/13/2020 FINDINGS: MRI HEAD FINDINGS Brain: Acute infarct in the left parietal lobe over the convexity. This extends anteriorly into the operculum and posterior insula. Small areas of acute infarct in the left frontal white matter and cortex and in the right frontal white matter. No associated hemorrhage. Ventricle size normal.  No mass or midline shift. Vascular: Normal arterial flow  voids. Skull and upper cervical spine: No focal lesion. Sinuses/Orbits: Paranasal sinuses clear.  Negative orbit Other: Motion degraded study. MRA HEAD FINDINGS Both vertebral arteries patent to the basilar. PICA patent bilaterally. Basilar widely patent. Superior cerebellar arteries patent bilaterally. Left posterior cerebral artery widely patent. Patent left posterior communicating artery. Fetal origin of the right posterior cerebral artery. Decreased signal in the right P1 and P2 segment. This vessel appeared patent on CTA earlier today. Question artifact versus thrombus. Internal carotid artery patent bilaterally without stenosis. Focal moderate stenosis left M1 segment. There is good distal flow and right middle cerebral artery branches are patent. Anterior cerebral arteries patent bilaterally. Moderate stenosis right middle cerebral artery. IMPRESSION: 1. Acute infarct left posterior MCA distribution involving the posterior insula in the left frontal parietal cortex. Small areas of acute infarct in the frontal lobes bilaterally. 2. Motion degraded study. 3. MRA demonstrates loss of signal in the right posterior cerebral artery. This vessel was patent on CTA earlier today. Possible artifact versus thrombus. 4. There is moderate stenosis in the middle cerebral artery M1 segment bilaterally. Left internal carotid artery appears widely patent. Electronically Signed   By: Franchot Gallo M.D.   On: 12/13/2020 19:17   CT Code Stroke Cerebral Perfusion with contrast  Result Date: 12/13/2020 CLINICAL DATA:  Acute presentation with left MCA syndrome. EXAM: CT ANGIOGRAPHY HEAD AND NECK CT PERFUSION BRAIN TECHNIQUE: Multidetector CT imaging of the head and neck was performed using the standard protocol during bolus administration of intravenous contrast. Multiplanar CT image reconstructions and MIPs were obtained to evaluate the vascular anatomy. Carotid stenosis measurements (when applicable) are obtained utilizing  NASCET criteria, using the distal internal carotid diameter as the denominator. Multiphase CT imaging of the brain was performed following IV bolus contrast injection. Subsequent parametric perfusion maps were calculated using RAPID software. CONTRAST:  154mL OMNIPAQUE IOHEXOL 350 MG/ML SOLN COMPARISON:  Head CT earlier tonight.  FINDINGS: CTA NECK FINDINGS Aortic arch: Aortic atherosclerotic calcification. Branching pattern is normal without origin stenosis. Right carotid system: Common carotid artery widely patent to the bifurcation. Soft and calcified plaque at the carotid bifurcation and ICA bulb. Minimal diameter at distal bulb is 1 mm or less, with a pronounced web-like stenosis. Flow is present distal to that. This is consistent with an 80% or greater stenosis. Left carotid system: Common carotid artery is patent to the bifurcation region. There is occlusion of the ICA at the origin. ECA is widely patent. No flow seen in the left internal carotid artery in till the siphon region, where there is reconstitution, probably from a combination of external internal collaterals and flow through a patent anterior and posterior communicating artery. Vertebral arteries: Atherosclerotic plaque at the right vertebral artery origin with 30-50% stenosis. The vessel is widely patent beyond that through the cervical region. Atherosclerotic plaque at the left vertebral artery origin with a 30-50% stenosis. Vessel is widely patent beyond that through the cervical region. Skeleton: Ordinary cervical spondylosis. Other neck: No mass or lymphadenopathy. Upper chest: Normal Review of the MIP images confirms the above findings CTA HEAD FINDINGS Anterior circulation: Right internal carotid artery is patent through the siphon region without stenosis. Right anterior and middle cerebral vessels appear patent and normal. As noted above, left internal carotid artery reconstitutes in the siphon region probably from a combination of external  to internal collaterals and patent communicating arteries. Flow is present in the anterior and middle cerebral vessels. I cannot clearly identify an occluded distal MCA branch. Posterior circulation: Both vertebral arteries are patent through the foramen magnum to the basilar. Some atherosclerotic narrowing and irregularity of the V4 segments, right more than left. No basilar stenosis. Posterior circulation branch vessels show flow. Venous sinuses: Patent and normal. Anatomic variants: None significant. Review of the MIP images confirms the above findings CT Brain Perfusion Findings: ASPECTS: In retrospect, probably 9, with some gray-white differentiation loss in the M4 region. CBF (<30%) Volume: 20mL Perfusion (Tmax>6.0s) volume: 10mL Mismatch Volume: 46mL Infarction Location:Left frontoparietal junction region. 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 Reconstitution in the siphon probably from a combination of external to internal collaterals and patent communicating arteries. I cannot clearly identify the occluded MCA branch vessel, though there must be one, probably in the M3 region. 16 cc completed infarction in the left frontoparietal junction region. Additional 42 cc at risk brain surrounding that region. 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 30-50 % stenoses of both vertebral artery origins. Results discussed with Dr. Curly Shores at approximately 0220 hours Electronically Signed   By: Nelson Chimes M.D.   On: 12/13/2020 02:36   ECHOCARDIOGRAM COMPLETE  Result Date: 12/13/2020    ECHOCARDIOGRAM REPORT   Patient Name:   Dustin Golden Date of Exam: 12/13/2020 Medical Rec #:  [id number]    Height:       76.0 in Accession #:    [id number]   Weight:       194.7 lb Date of Birth:  1956/06/02    BSA:          2.190 m Patient Age:    65 years     BP:           113/65 mmHg Patient Gender: M            HR:           68 bpm. Exam  Location:  Inpatient  Procedure: 2D Echo, Cardiac Doppler, Color Doppler and Intracardiac            Opacification Agent Indications:    Stroke  History:        Patient has no prior history of Echocardiogram examinations.                 Stroke; Risk Factors:Current Smoker. ETOH.  Sonographer:    Roseanna Rainbow RDCS Referring Phys: [id number] [place]  Sonographer Comments: Image acquisition challenging due to respiratory motion. Images extremely respiratory. IMPRESSIONS  1. Left ventricular ejection fraction, by estimation, is 35 to 40%. The left ventricle has moderately decreased function. The left ventricle demonstrates global hypokinesis. There is moderate concentric left ventricular hypertrophy. Left ventricular diastolic parameters are consistent with Grade I diastolic dysfunction (impaired relaxation).  2. Right ventricular systolic function is mildly reduced. The right ventricular size is mildly enlarged. There is normal pulmonary artery systolic pressure. The estimated right ventricular systolic pressure is 84.6 mmHg.  3. The mitral valve is normal in structure. Trivial mitral valve regurgitation. No evidence of mitral stenosis. Moderate mitral annular calcification.  4. The aortic valve is normal in structure. Aortic valve regurgitation is not visualized. No aortic stenosis is present.  5. The inferior vena cava is normal in size with greater than 50% respiratory variability, suggesting right atrial pressure of 3 mmHg. FINDINGS  Left Ventricle: Left ventricular ejection fraction, by estimation, is 35 to 40%. The left ventricle has moderately decreased function. The left ventricle demonstrates global hypokinesis. Definity contrast agent was given IV to delineate the left ventricular endocardial borders. The left ventricular internal cavity size was normal in size. There is moderate concentric left ventricular hypertrophy. Left ventricular diastolic parameters are consistent with Grade I diastolic dysfunction (impaired  relaxation). Normal left ventricular filling pressure. Right Ventricle: The right ventricular size is mildly enlarged. No increase in right ventricular wall thickness. Right ventricular systolic function is mildly reduced. There is normal pulmonary artery systolic pressure. The tricuspid regurgitant velocity  is 2.00 m/s, and with an assumed right atrial pressure of 3 mmHg, the estimated right ventricular systolic pressure is 96.2 mmHg. Left Atrium: Left atrial size was normal in size. Right Atrium: Right atrial size was normal in size. Pericardium: There is no evidence of pericardial effusion. Mitral Valve: The mitral valve is normal in structure. There is mild thickening of the mitral valve leaflet(s). Moderate mitral annular calcification. Trivial mitral valve regurgitation. No evidence of mitral valve stenosis. Tricuspid Valve: The tricuspid valve is normal in structure. Tricuspid valve regurgitation is mild . No evidence of tricuspid stenosis. Aortic Valve: The aortic valve is normal in structure. Aortic valve regurgitation is not visualized. No aortic stenosis is present. Pulmonic Valve: The pulmonic valve was normal in structure. Pulmonic valve regurgitation is not visualized. No evidence of pulmonic stenosis. Aorta: The aortic root is normal in size and structure. Venous: The inferior vena cava is normal in size with greater than 50% respiratory variability, suggesting right atrial pressure of 3 mmHg. IAS/Shunts: No atrial level shunt detected by color flow Doppler.  LEFT VENTRICLE PLAX 2D LVIDd:         5.30 cm      Diastology LVIDs:         4.50 cm      LV e' medial:    5.22 cm/s LV PW:         1.60 cm      LV E/e' medial:  11.4  LV IVS:        1.60 cm      LV e' lateral:   5.92 cm/s LVOT diam:     1.80 cm      LV E/e' lateral: 10.1 LV SV:         42 LV SV Index:   19 LVOT Area:     2.54 cm  LV Volumes (MOD) LV vol d, MOD A2C: 115.0 ml LV vol d, MOD A4C: 142.0 ml LV vol s, MOD A2C: 66.8 ml LV vol s, MOD  A4C: 105.0 ml LV SV MOD A2C:     48.2 ml LV SV MOD A4C:     142.0 ml LV SV MOD BP:      44.8 ml RIGHT VENTRICLE            IVC RV S prime:     7.79 cm/s  IVC diam: 2.40 cm TAPSE (M-mode): 1.6 cm LEFT ATRIUM             Index       RIGHT ATRIUM           Index LA diam:        3.10 cm 1.42 cm/m  RA Area:     17.30 cm LA Vol (A2C):   39.6 ml 18.08 ml/m RA Volume:   50.40 ml  23.01 ml/m LA Vol (A4C):   44.2 ml 20.18 ml/m LA Biplane Vol: 44.3 ml 20.23 ml/m  AORTIC VALVE LVOT Vmax:   90.20 cm/s LVOT Vmean:  62.700 cm/s LVOT VTI:    0.167 m  AORTA Ao Root diam: 3.40 cm MITRAL VALVE               TRICUSPID VALVE MV Area (PHT): 3.21 cm    TR Peak grad:   16.0 mmHg MV Decel Time: 236 msec    TR Vmax:        200.00 cm/s MV E velocity: 59.70 cm/s MV A velocity: 82.00 cm/s  SHUNTS MV E/A ratio:  0.73        Systemic VTI:  0.17 m                            Systemic Diam: 1.80 cm Ena Dawley MD Electronically signed by Ena Dawley MD Signature Date/Time: 12/13/2020/1:53:15 PM    Final    CT HEAD CODE STROKE WO CONTRAST  Result Date: 12/13/2020 CLINICAL DATA:  Code stroke. Right sided weakness, facial droop and slurred speech. EXAM: CT HEAD WITHOUT CONTRAST TECHNIQUE: Contiguous axial images were obtained from the base of the skull through the vertex without intravenous contrast. COMPARISON:  None. FINDINGS: Brain: No abnormality affects the brainstem or cerebellum. Cerebral hemispheres are normal for age. No advanced atrophy. No sign of old or acute infarction, mass lesion, hemorrhage, hydrocephalus or extra-axial collection. Vascular: No abnormal vascular finding. Skull: Normal Sinuses/Orbits: Clear/normal Other: None ASPECTS ([place] Stroke Program Early CT Score) - Ganglionic level infarction (caudate, lentiform nuclei, internal capsule, insula, M1-M3 cortex): 7 - Supraganglionic infarction (M4-M6 cortex): 3 Total score (0-10 with 10 being normal): 10 IMPRESSION: 1. Normal head CT. 2. Aspects 10. 3. These  results were communicated to Dr. Curly Shores at 2:00 amon 2/28/2022by text page via the [facility] messaging system. Electronically Signed   By: Nelson Chimes M.D.   On: 12/13/2020 02:02    Labs:  CBC: Recent Labs    12/13/20 0151 12/13/20 0200 12/14/20 0412  WBC 8.1  --  9.4  HGB 14.4 15.0 12.6*  HCT 43.3 44.0 36.4*  PLT 213  --  201    COAGS: Recent Labs    12/13/20 0151  INR 0.9  APTT 28    BMP: Recent Labs    12/13/20 0151 12/13/20 0200 12/14/20 0412  NA 139 142 138  K 4.0 4.0 3.5  CL 107 106 108  CO2 22  --  20*  GLUCOSE 117* 115* 106*  BUN 17 18 9   CALCIUM 9.2  --  8.5*  CREATININE 1.28* 1.30* 0.92  GFRNONAA >60  --  >60    LIVER FUNCTION TESTS: Recent Labs    12/13/20 0151  BILITOT 0.8  AST 22  ALT 16  ALKPHOS 75  PROT 7.2  ALBUMIN 4.0    Assessment and Plan:  History of acute CVA s/p cerebral arteriogram with emergent mechanical thrombectomy of left ICA at bulb and proximal left MCA M2 middle division occlusions, along with stent placement of left ICA at bulb stenosis, achieving a TICI 3 revascularization via right femoral approach 12/13/2020 by Dr. Karenann Cai. Patient's condition improving- awake and alert, follows simple commands, can spontaneously move all extremities with weakness of left side (can lift LUE but cannot hold), speech dysarthric but able to identify simple objects. Right femoral puncture site stable, distal pulses (DPs) palpable bilaterally with Doppler. Continue Cangrelor x24 hours post-procedure, recommend transitioning to DAPT following this. Plan to follow-up with Dr. Karenann Cai in clinic 1 month after discharge (NIR schedulers to call patient to set up this appointment).  Further plans per neurology/CCM- appreciate and agree with management. NIR to follow.   Electronically Signed: Earley Abide, PA-C 12/14/2020, 10:15 AM   I spent a total of 15 Minutes at the the patient's bedside AND on the patient's hospital  floor or unit, greater than 50% of which was counseling/coordinating care for CVA s/p revascularization.

## 2020-12-14 NOTE — Anesthesia Postprocedure Evaluation (Signed)
Anesthesia Post Note  Patient: Dustin Golden  Procedure(s) Performed: IR WITH ANESTHESIA (N/A )     Patient location during evaluation: PACU Anesthesia Type: General Level of consciousness: awake Pain management: pain level controlled Vital Signs Assessment: post-procedure vital signs reviewed and stable Respiratory status: spontaneous breathing, nonlabored ventilation, respiratory function stable and patient connected to nasal cannula oxygen Cardiovascular status: blood pressure returned to baseline and stable Postop Assessment: no apparent nausea or vomiting Anesthetic complications: no   No complications documented.              Effie Berkshire

## 2020-12-14 NOTE — Evaluation (Addendum)
Physical Therapy Evaluation Patient Details Name: Dustin Golden MRN: [id number] DOB: [birth year] Today's Date: 12/14/2020   History of Present Illness  65 year old male with past medical history significant for polysubstance abuse who presented as code stroke with aphasia and found to have occlusion of the left ICA.  Taken for thrombectomy left ICA at bulb and proximal left MCA M2 middle division occlusions, along with stent placement of left ICA at bulb stenosis, achieving a TICI 3 revascularization via right femoral approach 12/13/2020. MRI + Acute infarct left posterior MCA distribution involving the posterior insula in the left frontal parietal cortex. Small areas of acute infarct in the frontal lobes bilaterally.  Clinical Impression   Pt admitted with above diagnosis. PTA patient was independent and living alone. Works as a Administrator. Pt currently requires +2 mod assist for standing and very limited walking.  Patient with the following functional limitations due to the deficits listed below: impaired balance, impaired gait and transfers. Pt will benefit from skilled PT to increase their independence and safety with mobility to allow discharge to the venue listed below.       Follow Up Recommendations CIR    Equipment Recommendations  Rolling walker with 5" wheels    Recommendations for Other Services Rehab consult     Precautions / Restrictions Precautions Precautions: Fall Precaution Comments: A line      Mobility  Bed Mobility Overal bed mobility: Needs Assistance Bed Mobility: Rolling;Sidelying to Sit;Sit to Supine Rolling: Min assist Sidelying to sit: Min assist   Sit to supine: Min assist   General bed mobility comments: assist to RUE to protect from injury as decreased awareness of location    Transfers Overall transfer level: Needs assistance Equipment used: 2 person hand held assist Transfers: Sit to/from Stand Sit to Stand: Mod assist;+2 physical assistance          General transfer comment: pt with lean to right and requires support to achieve and maintain midline  Ambulation/Gait Ambulation/Gait assistance: Mod assist;+2 physical assistance Gait Distance (Feet): 1 Feet Assistive device: 2 person hand held assist Gait Pattern/deviations: Step-to pattern     General Gait Details: side-step to his left with small steps and support for wt-shifting and max cues for RLE placement  Stairs            Wheelchair Mobility    Modified Rankin (Stroke Patients Only) Modified Rankin (Stroke Patients Only) Pre-Morbid Rankin Score: No symptoms Modified Rankin: Moderately severe disability     Balance Overall balance assessment: Needs assistance Sitting-balance support: No upper extremity supported;Feet supported Sitting balance-Leahy Scale: Poor Sitting balance - Comments: right leaning; can correct with cues   Standing balance support: Bilateral upper extremity supported;During functional activity Standing balance-Leahy Scale: Poor Standing balance comment: stood for ~90 seconds with increasing lean to rt with fatigue (vs inattention)                             Pertinent Vitals/Pain Pain Assessment: No/denies pain    Home Living Family/patient expects to be discharged to:: Private residence Living Arrangements: Alone Available Help at Discharge: Family;Available 24 hours/day Type of Home: House Home Access: Stairs to enter   [employer] of Steps: 1 Home Layout: One level;Other (Comment) (basement) Home Equipment: Cane - single point;Crutches (walking stick)      Prior Function Level of Independence: Independent         Comments: Truck driver - Box truck  Hand Dominance   Dominant Hand: Right    Extremity/Trunk Assessment   Upper Extremity Assessment Upper Extremity Assessment: Defer to OT evaluation     Lower Extremity Assessment Lower Extremity Assessment: RLE  deficits/detail RLE Deficits / Details: decreased coordination related to decr sensory input; inattention also likely contributing to inconsistent muscular support in standing RLE Sensation: decreased light touch (to possibly absent--very delayed responses with 50% accuracy)    Cervical / Trunk Assessment Cervical / Trunk Assessment: Other exceptions (R bias) Cervical / Trunk Exceptions: decreased awareness of midline orientation  Communication   Communication: Expressive difficulties  Cognition Arousal/Alertness: Awake/alert Behavior During Therapy: WFL for tasks assessed/performed Overall Cognitive Status: Impaired/Different from baseline Area of Impairment: Attention;Memory;Following commands;Safety/judgement;Awareness;Problem solving                   Current Attention Level: Selective Memory: Decreased short-term memory Following Commands: Follows one step commands consistently Safety/Judgement: Decreased awareness of safety;Decreased awareness of deficits Awareness: Emergent Problem Solving: Slow processing;Difficulty sequencing        General Comments General comments (skin integrity, edema, etc.): SBPs 150s down to 110s with changes in position and likely due to RN having just medicated for BP control    Exercises     Assessment/Plan    PT Assessment Patient needs continued PT services  PT Problem List Decreased strength;Decreased balance;Decreased mobility;Decreased coordination;Decreased cognition;Decreased knowledge of use of DME;Decreased safety awareness;Decreased knowledge of precautions;Cardiopulmonary status limiting activity;Impaired sensation       PT Treatment Interventions DME instruction;Gait training;Functional mobility training;Therapeutic activities;Therapeutic exercise;Balance training;Neuromuscular re-education;Cognitive remediation;Patient/family education    PT Goals (Current goals can be found in the Care Plan section)  Acute Rehab PT  Goals Patient Stated Goal: return to baseline PT Goal Formulation: With patient Time For Goal Achievement: 12/28/20 Potential to Achieve Goals: Good    Frequency Min 4X/week   Barriers to discharge Decreased caregiver support      Co-evaluation PT/OT/SLP Co-Evaluation/Treatment: Yes Reason for Co-Treatment: Complexity of the patient's impairments (multi-system involvement);For patient/therapist safety;To address functional/ADL transfers PT goals addressed during session: Mobility/safety with mobility;Balance OT goals addressed during session: ADL's and self-care       AM-PAC PT "6 Clicks" Mobility  Outcome Measure Help needed turning from your back to your side while in a flat bed without using bedrails?: A Little Help needed moving from lying on your back to sitting on the side of a flat bed without using bedrails?: A Little Help needed moving to and from a bed to a chair (including a wheelchair)?: Total Help needed standing up from a chair using your arms (e.g., wheelchair or bedside chair)?: Total Help needed to walk in hospital room?: Total Help needed climbing 3-5 steps with a railing? : Total 6 Click Score: 10    End of Session Equipment Utilized During Treatment: Gait belt Activity Tolerance: Patient tolerated treatment well Patient left: in bed;with call bell/phone within reach;with bed alarm set;with family/visitor present (pt leaving for procedure and returned to bed) Nurse Communication: Mobility status PT Visit Diagnosis: Hemiplegia and hemiparesis Hemiplegia - Right/Left: Right Hemiplegia - dominant/non-dominant: Dominant Hemiplegia - caused by: Cerebral infarction    Time: 1204-1239 PT Time Calculation (min) (ACUTE ONLY): 35 min   Charges:   PT Evaluation $PT Eval Moderate Complexity: 1 Mod           Arby Barrette, PT Pager [phone]   Rexanne Mano 12/14/2020, 3:02 PM

## 2020-12-14 NOTE — Progress Notes (Addendum)
3 ounces of water given to patient and was able to swallow without complications.

## 2020-12-14 NOTE — Evaluation (Signed)
Speech Language Pathology Evaluation Patient Details Name: Dustin Golden MRN: [id number] DOB: 07-21-1956 Today's Date: 12/14/2020 Time: 1145-1200 SLP Time Calculation (min) (ACUTE ONLY): 15 min  Problem List:  Patient Active Problem List   Diagnosis Date Noted  . Stroke ([place]) 12/13/2020  . Polysubstance (excluding opioids) dependence ([place])    Past Medical History: No past medical history on file. Past Surgical History:  Past Surgical History:  Procedure Laterality Date  . APPENDECTOMY    . gastric ulcer surgery    . RADIOLOGY WITH ANESTHESIA N/A 12/13/2020   Procedure: IR WITH ANESTHESIA;  Surgeon: Luanne Bras, MD;  Location: [place];  Service: Radiology;  Laterality: N/A;   HPI:  65 y.o. male with past medical history significant for ongoing tobacco abuse, alcohol use and drug use (marijuana and possibly other substances) per EMS report per bystander report. 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 Small areas of acute infarct in the frontal lobes bilaterally. Pt s/p successful mechanical thrombectomy of the proximal left M2's/MCA middle division occlusion.   Assessment / Plan / Recommendation Clinical Impression  Pt presents with novel deficits in cognitive linguistics post CVA including expressive aphasia, dysarthria of speech, and cognitive deficits. Pt primarily fluent with speech however displays and self reports frequent anomia of speech. Receptive language skills appeared intact. Dysarthria of speech c/b reduced articulation of sounds, decreased vocal intensity largely impairing communication intelligibilty. Portions of SLUMS administered, pt exhibiting deficits in novel and short term recall, reduced executive function skills, and slowed thought organization. PLOF pt independent with all ADLs, pt was working part time as a Administrator. Pt with supportive girlfriend per daughter and mom  who are at bedside. SLP to follow up for cognitive linguistic deficits.    SLP Assessment  SLP Recommendation/Assessment: Patient needs continued Speech Lanaguage Pathology Services SLP Visit Diagnosis: Cognitive communication deficit (R41.841);Dysarthria and anarthria (R47.1);Aphasia (R47.01)    Follow Up Recommendations  Inpatient Rehab    Frequency and Duration min 2x/week  2 weeks      SLP Evaluation Cognition  Overall Cognitive Status: Impaired/Different from baseline Arousal/Alertness: Awake/alert Orientation Level: Oriented X4 (Simultaneous filing. User may not have seen previous data.) Attention: Alternating Alternating Attention: Impaired Memory: Impaired Memory Impairment: Decreased recall of new information;Decreased short term memory Decreased Short Term Memory: Verbal complex;Functional complex Problem Solving: Impaired Executive Function: Sequencing;Organizing Sequencing: Impaired Sequencing Impairment: Verbal complex Organizing: Impaired Organizing Impairment: Verbal complex;Functional complex Safety/Judgment: Impaired       Comprehension  Auditory Comprehension Overall Auditory Comprehension: Appears within functional limits for tasks assessed Visual Recognition/Discrimination Discrimination:  (nearsighted, glasses at baseline; currently at home)    Expression Expression Primary Mode of Expression: Verbal Verbal Expression Overall Verbal Expression: Impaired Initiation: Impaired Level of Generative/Spontaneous Verbalization: Sentence Naming: Impairment Written Expression Dominant Hand: Right   Oral / Motor  Oral Motor/Sensory Function Overall Oral Motor/Sensory Function: Moderate impairment Facial ROM: Reduced right;Suspected CN VII (facial) dysfunction Facial Symmetry: Abnormal symmetry right;Suspected CN VII (facial) dysfunction Facial Strength: Reduced right;Suspected CN VII (facial) dysfunction Facial Sensation: Reduced right;Suspected CN V  (Trigeminal) dysfunction Lingual ROM: Reduced right;Suspected CN XII (hypoglossal) dysfunction Lingual Strength: Reduced Lingual Sensation: Reduced Motor Speech Overall Motor Speech: Impaired Respiration: Impaired Level of Impairment: Word Phonation: Low vocal intensity Resonance: Hypernasality Articulation: Impaired Level of Impairment: Word Intelligibility: Intelligibility reduced Word: 50-74% accurate Phrase: 25-49% accurate Sentence: 25-49% accurate Motor Planning: Witnin functional limits Effective Techniques: Slow rate;Over-articulate;Pause  [place], [place] Acute Rehabilitation Services   12/14/2020, 12:35 PM

## 2020-12-15 ENCOUNTER — Inpatient Hospital Stay (HOSPITAL_COMMUNITY): Payer: Commercial Managed Care - PPO

## 2020-12-15 DIAGNOSIS — I6300 Cerebral infarction due to thrombosis of unspecified precerebral artery: Secondary | ICD-10-CM | POA: Diagnosis not present

## 2020-12-15 LAB — CBC
HCT: 37 % — ABNORMAL LOW (ref 39.0–52.0)
Hemoglobin: 12.2 g/dL — ABNORMAL LOW (ref 13.0–17.0)
MCH: 26.2 pg (ref 26.0–34.0)
MCHC: 33 g/dL (ref 30.0–36.0)
MCV: 79.6 fL — ABNORMAL LOW (ref 80.0–100.0)
Platelets: 197 10*3/uL (ref 150–400)
RBC: 4.65 MIL/uL (ref 4.22–5.81)
RDW: 15.1 % (ref 11.5–15.5)
WBC: 9.7 10*3/uL (ref 4.0–10.5)
nRBC: 0 % (ref 0.0–0.2)

## 2020-12-15 LAB — GLUCOSE, CAPILLARY
Glucose-Capillary: 142 mg/dL — ABNORMAL HIGH (ref 70–99)
Glucose-Capillary: 344 mg/dL — ABNORMAL HIGH (ref 70–99)
Glucose-Capillary: 89 mg/dL (ref 70–99)
Glucose-Capillary: 90 mg/dL (ref 70–99)
Glucose-Capillary: 97 mg/dL (ref 70–99)

## 2020-12-15 LAB — BASIC METABOLIC PANEL
Anion gap: 13 (ref 5–15)
BUN: 10 mg/dL (ref 8–23)
CO2: 18 mmol/L — ABNORMAL LOW (ref 22–32)
Calcium: 8.6 mg/dL — ABNORMAL LOW (ref 8.9–10.3)
Chloride: 106 mmol/L (ref 98–111)
Creatinine, Ser: 1.08 mg/dL (ref 0.61–1.24)
GFR, Estimated: >60 mL/min (ref >60)
Glucose, Bld: 87 mg/dL (ref 70–99)
Potassium: 3.7 mmol/L (ref 3.5–5.1)
Sodium: 137 mmol/L (ref 135–145)

## 2020-12-15 LAB — MAGNESIUM: Magnesium: 2 mg/dL (ref 1.7–2.4)

## 2020-12-15 LAB — PHOSPHORUS: Phosphorus: 4.2 mg/dL (ref 2.5–4.6)

## 2020-12-15 MED ORDER — NICOTINE 7 MG/24HR TD PT24
7.0000 mg | MEDICATED_PATCH | Freq: Every day | TRANSDERMAL | Status: DC
  Administered 2020-12-15 – 2020-12-17 (×3): 7 mg via TRANSDERMAL
  Filled 2020-12-15 (×4): qty 1

## 2020-12-15 MED ORDER — PANTOPRAZOLE SODIUM 40 MG PO TBEC
40.0000 mg | DELAYED_RELEASE_TABLET | Freq: Every day | ORAL | Status: DC
  Administered 2020-12-15 – 2020-12-16 (×2): 40 mg via ORAL
  Filled 2020-12-15 (×2): qty 1

## 2020-12-15 NOTE — Progress Notes (Signed)
STROKE TEAM PROGRESS NOTE   INTERVAL HISTORY Patient is sitting up in bed.  His daughter and girlfriend at the bedside.  He continues to have expressive aphasia and dysarthria but speech and language continue to improve daily .  He is off Precedex drip.  Plan is to transfer him to the floor today.  Critical care team is signing off.  Therapist recommend inpatient rehab  Vitals:   12/15/20 1000 12/15/20 1100 12/15/20 1200 12/15/20 1305  BP: (!) 155/87 (!) 162/91 (!) 147/93 (!) 148/85  Pulse: 92 93 80 93  Resp: 16 16 19 17   Temp:  98.9 F (37.2 C)    TempSrc:  Oral    SpO2: 99% 100% 100% 100%  Weight:       CBC:  Recent Labs  Lab 12/13/20 0151 12/13/20 0200 12/14/20 0412 12/15/20 0235  WBC 8.1  --  9.4 9.7  NEUTROABS 4.4  --   --   --   HGB 14.4   < > 12.6* 12.2*  HCT 43.3   < > 36.4* 37.0*  MCV 78.9*  --  78.1* 79.6*  PLT 213  --  201 197   < > = values in this interval not displayed.   Basic Metabolic Panel:  Recent Labs  Lab 12/14/20 0412 12/14/20 1031 12/15/20 0235  NA 138  --  137  K 3.5  --  3.7  CL 108  --  106  CO2 20*  --  18*  GLUCOSE 106*  --  87  BUN 9  --  10  CREATININE 0.92  --  1.08  CALCIUM 8.5*  --  8.6*  MG  --  2.1 2.0  PHOS  --  3.4 4.2   Lipid Panel:  Recent Labs  Lab 12/13/20 0555  CHOL 179  TRIG 81  HDL 54  CHOLHDL 3.3  VLDL 16  LDLCALC 109*   HgbA1c:  Recent Labs  Lab 12/13/20 0541  HGBA1C 6.8*   Urine Drug Screen:  Recent Labs  Lab 12/13/20 1245  LABOPIA NONE DETECTED  COCAINSCRNUR POSITIVE*  LABBENZ NONE DETECTED  AMPHETMU NONE DETECTED  THCU POSITIVE*  LABBARB NONE DETECTED    Alcohol Level  Recent Labs  Lab 12/13/20 0541  ETH <10    IMAGING past 24 hours CT Code Stroke CTA Head W/WO contrast  Result Date: 12/13/2020 IMPRESSION: Occlusion of the left ICA at the origin. Reconstitution in the siphon probably from a combination of external to internal collaterals and patent communicating arteries. I cannot  clearly identify the occluded MCA branch vessel, though there must be one, probably in the M3 region. 16 cc completed infarction in the left frontoparietal junction region. Additional 42 cc at risk brain surrounding that region. 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 30-50 % stenoses of both vertebral artery origins.  CT Code Stroke CTA Neck W/WO contrast  Result Date: 12/13/2020 IMPRESSION: Occlusion of the left ICA at the origin. Reconstitution in the siphon probably from a combination of external to internal collaterals and patent communicating arteries. I cannot clearly identify the occluded MCA branch vessel, though there must be one, probably in the M3 region. 16 cc completed infarction in the left frontoparietal junction region. Additional 42 cc at risk brain surrounding that region. 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 30-50 % stenoses of both vertebral artery origins. Results discussed with Dr. Curly Shores at  approximately 0220 hours Electronically Signed   By: Nelson Chimes M.D.   On: 12/13/2020 02:36   CT Code Stroke Cerebral Perfusion with contrast  Result Date: 12/13/2020 IMPRESSION: Occlusion of the left ICA at the origin. Reconstitution in the siphon probably from a combination of external to internal collaterals and patent communicating arteries. I cannot clearly identify the occluded MCA branch vessel, though there must be one, probably in the M3 region. 16 cc completed infarction in the left frontoparietal junction region. Additional 42 cc at risk brain surrounding that region. 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 30-50 % stenoses of both vertebral artery origins. Results discussed with Dr. Curly Shores at approximately 0220 hours Electronically Signed   By: Nelson Chimes M.D.   On: 12/13/2020 02:36   CT HEAD CODE STROKE WO  CONTRAST  Result Date: 12/13/2020 1. Normal head CT.  2. Aspects 10.   Echo 12/13/2020 1. Left ventricular ejection fraction, by estimation, is 35 to 40%. The  left ventricle has moderately decreased function. The left ventricle  demonstrates global hypokinesis. There is moderate concentric left  ventricular hypertrophy. Left ventricular  diastolic parameters are consistent with Grade I diastolic dysfunction  (impaired relaxation).  2. Right ventricular systolic function is mildly reduced. The right  ventricular size is mildly enlarged. There is normal pulmonary artery  systolic pressure. The estimated right ventricular systolic pressure is  12.4 mmHg.  3. The mitral valve is normal in structure. Trivial mitral valve  regurgitation. No evidence of mitral stenosis. Moderate mitral annular  calcification.  4. The aortic valve is normal in structure. Aortic valve regurgitation is  not visualized. No aortic stenosis is present.  5. The inferior vena cava is normal in size with greater than 50%  respiratory variability, suggesting right atrial pressure of 3 mmHg.     PHYSICAL EXAM Middle-age African-American male not in distress. . Afebrile. Head is nontraumatic. Neck is supple without bruit.    Cardiac exam no murmur or gallop. Lungs are clear to auscultation. Distal pulses are well felt. Neurological Exam :  Patient is awake alert has moderate expressive aphasia with nonfluent speech with word finding difficulties.  There is mild dysarthria but can be understood with some difficulties.  He has good comprehension.  Difficulty with naming and repetition.  Extraocular movements are full range without nystagmus.  Face is asymmetric with mild right lower facial weakness..  Tongue midline.  Motor system exam shows mild right grip and hand and hip flexor weakness   Sensation intact.  Gait not tested.  ASSESSMENT/PLAN [name] is a 65 y.o. male with history of tobacco abuse, alcohol  use, drug use presenting with aphasia and right sided hemiparesis.   CT Angio head showed a left ICA occlusion at the bulb and proximal left MCA/M2 middle division branch occlusion. NIHSS 22 initially subsequently 10. s/p thrombectomy with combine stent retriever and aspiration with complete left MCA recanalization after one pass ([id number]). Delay angiogram showed near reocclusion. Patient loaded on cangrelor. A carotid stent was deployed across the bifurcation with use of cerebral protection device followed by in stent angioplasty.   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 ASPECTS 10.     CTA head & neck occlusion of the left ICA  CT perfusion:16 cc completed infarction in the left frontoparietal junction region. Additional 42 cc at risk brain  surrounding that region.  MRI  Acute infarct left posterior MCA distribution involving the posterior insula in the left frontal parietal cortex. Small areas of  acute infarct in the frontal lobes bilaterally.  MRA There is moderate stenosis in the middle cerebral artery M1 segment bilaterally. Left internal carotid artery appears widely  patent.  2D Echo: EF: 30-40%, left ventricle  demonstrates global hypokinesis, LVH, Grade 1 diastolic dysfunction  LDL 109  HgbA1c 6.8  VTE prophylaxis - SCDs only for now  Took occasional aspirin for pain prior to admission, was on Cangrelor x24 hours s/p thrombectomy, will d/c and start aspirin 81 and Brilinta 90bid  Will likely need 30 day event monitor on discharge if no arrythmias captured   Therapy recommendations:  PT CIR, OT pending  Disposition:  Pending  May need 30 day monitor at the time of discharge to evaluate for arrhythmias  Hypertension  Home meds:  none  stable, requiring cleviprex to maintain goals, wean off  Start amlodipine 5mg  daily  Change BP goal <160  Hydralazine PRN . Long-term BP goal  normotensive  Dysphagia  Likely related to stroke  MBS today: Dysphagia 3 solids thin liquids  Hyperlipidemia  Home meds:  none,   LDL 109, goal < 70  Add simvastatin once patient passes swallow eval   High intensity statin    Continue statin at discharge  Pre Diabetes   Home meds:  none  HgbA1c 6.8, goal < 7.0  CBGs Recent Labs    12/15/20 0011 12/15/20 0810 12/15/20 1139  GLUCAP 89 142* 90      SSI  Other Stroke Risk Factors  Cigarette smoker advised to stop smoking  ETOH use, alcohol level <10, advised to drink no more than 1-2 drink(s) a day  Substance abuse - UDS:  THC, Cocaine. Patient advised to stop using due to stroke risk.   Congestive heart failure  Other Active Problems  Alcohol abuse  Folic acid, thiamine, and MVI  Hospital day # 2  Plan mobilize out of bed.  Continue ongoing physical Occupational Therapy and will place rehab consult.  Speech therapy for language and swallowing.  Aspirin and Brilinta for 3 to 6 months given stent followed by aspirin alone.  Discussed with patient and daughter and girlfriend at the bedside and answered questions.  Plan transfer to neurology floor bed today.  Greater than 50% time during this 35-minute visit was spent on counseling and coordination of care about his stroke and discussion with care team and family and answering questions.   Antony Contras, MD    To contact Stroke Continuity provider, please refer to [email or web address] After hours, contact General Neurology

## 2020-12-15 NOTE — Progress Notes (Signed)
SLP Cancellation Note  Patient Details Name: Dustin Golden MRN: [id number] DOB: [birth year]   Cancelled treatment:       Reason Eval/Treat Not Completed: Patient at procedure or test/unavailable (Pt currently receiving care from RNs and NT. SLP will follow up later as schedule allows.)  Shanika I. Hardin Negus, [place], [place] Office number [phone] Pager [place] 12/15/2020, 4:23 PM

## 2020-12-15 NOTE — Progress Notes (Signed)
Referring Physician(s): Code stroke- Bhagat, Srishti L (neurology)  Supervising Physician: [name]  Patient Status:  [name] - In-pt  Chief Complaint: "Speech"  Subjective:  History of acute CVA s/p cerebral arteriogram with emergent mechanical thrombectomy of left ICA at bulb and proximal left MCA M2 middle division occlusions, along with stent placement of left ICA at bulb stenosis, achieving a TICI 3 revascularization via right femoral approach 12/13/2020 by Dr. Karenann Cai. Patient awake and alert sitting in bed watching TV. Wife and daughter at bedside. Follows simple commands. Speech dysarthric but improved from yesterday. Can spontaneously move all extremities with weakness of left side. Right femoral puncture site c/d/i.   Allergies: Ibuprofen, Sulfa antibiotics, and Sulfamethoxazole  Medications: Prior to Admission medications   Medication Sig Start Date End Date Taking? Authorizing Provider  BAYER ASPIRIN 325 MG EC tablet Take 325 mg by mouth as needed for pain.   Yes [provider]     Vital Signs: BP (!) 139/54   Pulse 93   Temp 98.3 F (36.8 C) (Oral)   Resp 18   Wt 194 lb 0.1 oz (88 kg)   SpO2 98%   BMI 23.62 kg/m   Physical Exam Vitals and nursing note reviewed.  Constitutional:      General: He is not in acute distress. Pulmonary:     Effort: Pulmonary effort is normal. No respiratory distress.  Skin:    General: Skin is warm and dry.     Comments: Right femoral puncture site soft without active bleeding or hematoma.  Neurological:     Mental Status: He is alert.     Comments: Alert, awake, and oriented x3. Follows simple commands. Speech dysarthric but improved from yesterady. PERRL bilaterally. Tongue midline. can spontaneously move all extremities with weakness of left side. No pronator drift. Distal pulses (DPs) 1+ bilaterally.      Imaging: CT Code Stroke CTA Head W/WO contrast  Result Date:  12/13/2020 CLINICAL DATA:  Acute presentation with left MCA syndrome. EXAM: CT ANGIOGRAPHY HEAD AND NECK CT PERFUSION BRAIN TECHNIQUE: Multidetector CT imaging of the head and neck was performed using the standard protocol during bolus administration of intravenous contrast. Multiplanar CT image reconstructions and MIPs were obtained to evaluate the vascular anatomy. Carotid stenosis measurements (when applicable) are obtained utilizing NASCET criteria, using the distal internal carotid diameter as the denominator. Multiphase CT imaging of the brain was performed following IV bolus contrast injection. Subsequent parametric perfusion maps were calculated using RAPID software. CONTRAST:  162m OMNIPAQUE IOHEXOL 350 MG/ML SOLN COMPARISON:  Head CT earlier tonight. FINDINGS: CTA NECK FINDINGS Aortic arch: Aortic atherosclerotic calcification. Branching pattern is normal without origin stenosis. Right carotid system: Common carotid artery widely patent to the bifurcation. Soft and calcified plaque at the carotid bifurcation and ICA bulb. Minimal diameter at distal bulb is 1 mm or less, with a pronounced web-like stenosis. Flow is present distal to that. This is consistent with an 80% or greater stenosis. Left carotid system: Common carotid artery is patent to the bifurcation region. There is occlusion of the ICA at the origin. ECA is widely patent. No flow seen in the left internal carotid artery in till the siphon region, where there is reconstitution, probably from a combination of external internal collaterals and flow through a patent anterior and posterior communicating artery. Vertebral arteries: Atherosclerotic plaque at the right vertebral artery origin with 30-50% stenosis. The vessel is widely patent beyond that through the cervical region.  Atherosclerotic plaque at the left vertebral artery origin with a 30-50% stenosis. Vessel is widely patent beyond that through the cervical region. Skeleton: Ordinary  cervical spondylosis. Other neck: No mass or lymphadenopathy. Upper chest: Normal Review of the MIP images confirms the above findings CTA HEAD FINDINGS Anterior circulation: Right internal carotid artery is patent through the siphon region without stenosis. Right anterior and middle cerebral vessels appear patent and normal. As noted above, left internal carotid artery reconstitutes in the siphon region probably from a combination of external to internal collaterals and patent communicating arteries. Flow is present in the anterior and middle cerebral vessels. I cannot clearly identify an occluded distal MCA branch. Posterior circulation: Both vertebral arteries are patent through the foramen magnum to the basilar. Some atherosclerotic narrowing and irregularity of the V4 segments, right more than left. No basilar stenosis. Posterior circulation branch vessels show flow. Venous sinuses: Patent and normal. Anatomic variants: None significant. Review of the MIP images confirms the above findings CT Brain Perfusion Findings: ASPECTS: In retrospect, probably 9, with some gray-white differentiation loss in the M4 region. CBF (<30%) Volume: 73m Perfusion (Tmax>6.0s) volume: 560mMismatch Volume: 4285mnfarction Location:Left frontoparietal junction region. 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 Reconstitution in the siphon probably from a combination of external to internal collaterals and patent communicating arteries. I cannot clearly identify the occluded MCA branch vessel, though there must be one, probably in the M3 region. 16 cc completed infarction in the left frontoparietal junction region. Additional 42 cc at risk brain surrounding that region. 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 30-50 % stenoses of both vertebral artery origins. Results discussed with Dr. BhaCurly [place] approximately 0220 hours Electronically Signed   By: MarNelson ChimesD.    On: 12/13/2020 02:36   CT Code Stroke CTA Neck W/WO contrast  Result Date: 12/13/2020 CLINICAL DATA:  Acute presentation with left MCA syndrome. EXAM: CT ANGIOGRAPHY HEAD AND NECK CT PERFUSION BRAIN TECHNIQUE: Multidetector CT imaging of the head and neck was performed using the standard protocol during bolus administration of intravenous contrast. Multiplanar CT image reconstructions and MIPs were obtained to evaluate the vascular anatomy. Carotid stenosis measurements (when applicable) are obtained utilizing NASCET criteria, using the distal internal carotid diameter as the denominator. Multiphase CT imaging of the brain was performed following IV bolus contrast injection. Subsequent parametric perfusion maps were calculated using RAPID software. CONTRAST:  100m78mNIPAQUE IOHEXOL 350 MG/ML SOLN COMPARISON:  Head CT earlier tonight. FINDINGS: CTA NECK FINDINGS Aortic arch: Aortic atherosclerotic calcification. Branching pattern is normal without origin stenosis. Right carotid system: Common carotid artery widely patent to the bifurcation. Soft and calcified plaque at the carotid bifurcation and ICA bulb. Minimal diameter at distal bulb is 1 mm or less, with a pronounced web-like stenosis. Flow is present distal to that. This is consistent with an 80% or greater stenosis. Left carotid system: Common carotid artery is patent to the bifurcation region. There is occlusion of the ICA at the origin. ECA is widely patent. No flow seen in the left internal carotid artery in till the siphon region, where there is reconstitution, probably from a combination of external internal collaterals and flow through a patent anterior and posterior communicating artery. Vertebral arteries: Atherosclerotic plaque at the right vertebral artery origin with 30-50% stenosis. The vessel is widely patent beyond that through the cervical region. Atherosclerotic plaque at the left vertebral artery origin with a 30-50%  stenosis. Vessel is  widely patent beyond that through the cervical region. Skeleton: Ordinary cervical spondylosis. Other neck: No mass or lymphadenopathy. Upper chest: Normal Review of the MIP images confirms the above findings CTA HEAD FINDINGS Anterior circulation: Right internal carotid artery is patent through the siphon region without stenosis. Right anterior and middle cerebral vessels appear patent and normal. As noted above, left internal carotid artery reconstitutes in the siphon region probably from a combination of external to internal collaterals and patent communicating arteries. Flow is present in the anterior and middle cerebral vessels. I cannot clearly identify an occluded distal MCA branch. Posterior circulation: Both vertebral arteries are patent through the foramen magnum to the basilar. Some atherosclerotic narrowing and irregularity of the V4 segments, right more than left. No basilar stenosis. Posterior circulation branch vessels show flow. Venous sinuses: Patent and normal. Anatomic variants: None significant. Review of the MIP images confirms the above findings CT Brain Perfusion Findings: ASPECTS: In retrospect, probably 9, with some gray-white differentiation loss in the M4 region. CBF (<30%) Volume: 7m Perfusion (Tmax>6.0s) volume: 512mMismatch Volume: 4281mnfarction Location:Left frontoparietal junction region. 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 Reconstitution in the siphon probably from a combination of external to internal collaterals and patent communicating arteries. I cannot clearly identify the occluded MCA branch vessel, though there must be one, probably in the M3 region. 16 cc completed infarction in the left frontoparietal junction region. Additional 42 cc at risk brain surrounding that region. 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 30-50 % stenoses of both vertebral artery origins. Results discussed with Dr. BhaCurly [place] approximately 0220 hours Electronically Signed   By: MarNelson ChimesD.   On: 12/13/2020 02:36   MR ANGIO HEAD WO CONTRAST  Result Date: 12/13/2020 CLINICAL DATA:  Stroke. Post left internal carotid artery thrombectomy and stenting of left carotid bifurcation. EXAM: MRI HEAD WITHOUT CONTRAST MRA HEAD WITHOUT CONTRAST TECHNIQUE: Multiplanar, multiecho pulse sequences of the brain and surrounding structures were obtained without intravenous contrast. Angiographic images of the head were obtained using MRA technique without contrast. COMPARISON:  CT angio head and neck and CT perfusion 12/13/2020 FINDINGS: MRI HEAD FINDINGS Brain: Acute infarct in the left parietal lobe over the convexity. This extends anteriorly into the operculum and posterior insula. Small areas of acute infarct in the left frontal white matter and cortex and in the right frontal white matter. No associated hemorrhage. Ventricle size normal.  No mass or midline shift. Vascular: Normal arterial flow voids. Skull and upper cervical spine: No focal lesion. Sinuses/Orbits: Paranasal sinuses clear.  Negative orbit Other: Motion degraded study. MRA HEAD FINDINGS Both vertebral arteries patent to the basilar. PICA patent bilaterally. Basilar widely patent. Superior cerebellar arteries patent bilaterally. Left posterior cerebral artery widely patent. Patent left posterior communicating artery. Fetal origin of the right posterior cerebral artery. Decreased signal in the right P1 and P2 segment. This vessel appeared patent on CTA earlier today. Question artifact versus thrombus. Internal carotid artery patent bilaterally without stenosis. Focal moderate stenosis left M1 segment. There is good distal flow and right middle cerebral artery branches are patent. Anterior cerebral arteries patent bilaterally. Moderate stenosis right middle cerebral artery. IMPRESSION: 1. Acute infarct left posterior MCA distribution involving the posterior insula in the left  frontal parietal cortex. Small areas of acute infarct in the frontal lobes bilaterally. 2. Motion degraded study. 3. MRA demonstrates loss of signal in the right  posterior cerebral artery. This vessel was patent on CTA earlier today. Possible artifact versus thrombus. 4. There is moderate stenosis in the middle cerebral artery M1 segment bilaterally. Left internal carotid artery appears widely patent. Electronically Signed   By: Franchot Gallo M.D.   On: 12/13/2020 19:17   MR BRAIN WO CONTRAST  Result Date: 12/13/2020 CLINICAL DATA:  Stroke. Post left internal carotid artery thrombectomy and stenting of left carotid bifurcation. EXAM: MRI HEAD WITHOUT CONTRAST MRA HEAD WITHOUT CONTRAST TECHNIQUE: Multiplanar, multiecho pulse sequences of the brain and surrounding structures were obtained without intravenous contrast. Angiographic images of the head were obtained using MRA technique without contrast. COMPARISON:  CT angio head and neck and CT perfusion 12/13/2020 FINDINGS: MRI HEAD FINDINGS Brain: Acute infarct in the left parietal lobe over the convexity. This extends anteriorly into the operculum and posterior insula. Small areas of acute infarct in the left frontal white matter and cortex and in the right frontal white matter. No associated hemorrhage. Ventricle size normal.  No mass or midline shift. Vascular: Normal arterial flow voids. Skull and upper cervical spine: No focal lesion. Sinuses/Orbits: Paranasal sinuses clear.  Negative orbit Other: Motion degraded study. MRA HEAD FINDINGS Both vertebral arteries patent to the basilar. PICA patent bilaterally. Basilar widely patent. Superior cerebellar arteries patent bilaterally. Left posterior cerebral artery widely patent. Patent left posterior communicating artery. Fetal origin of the right posterior cerebral artery. Decreased signal in the right P1 and P2 segment. This vessel appeared patent on CTA earlier today. Question artifact versus thrombus.  Internal carotid artery patent bilaterally without stenosis. Focal moderate stenosis left M1 segment. There is good distal flow and right middle cerebral artery branches are patent. Anterior cerebral arteries patent bilaterally. Moderate stenosis right middle cerebral artery. IMPRESSION: 1. Acute infarct left posterior MCA distribution involving the posterior insula in the left frontal parietal cortex. Small areas of acute infarct in the frontal lobes bilaterally. 2. Motion degraded study. 3. MRA demonstrates loss of signal in the right posterior cerebral artery. This vessel was patent on CTA earlier today. Possible artifact versus thrombus. 4. There is moderate stenosis in the middle cerebral artery M1 segment bilaterally. Left internal carotid artery appears widely patent. Electronically Signed   By: Franchot Gallo M.D.   On: 12/13/2020 19:17   IR INTRAVSC STENT CERV CAROTID W/EMB-PROT MOD SED  Result Date: 12/14/2020 INDICATION: 65 year old male with past medical history significant for tobacco use, alcohol and possible drug abuse. Patient presented severe aphasia and right-sided weakness. Initial NIHSS 22 later improving to 10. Head CT showed hypodensity in the left posterior frontal region (aspects 9) CT/CT angiogram of the head and neck showed a left ICA occlusion at the bulb with intracranial reconstitution in a left M3/MCA branch occlusion. Additionally patient head high-grade stenosis of the cervical right ICA and mild stenosis at the origin of the bilateral vertebral arteries. CT perfusion showed a core infarct of 16 mL with a 58 mL penumbra. He was then transferred to our service for a diagnostic cerebral angiogram with mechanical thrombectomy. EXAM: Ultrasound-guided vascular access Diagnostic cerebral angiogram Mechanical thrombectomy Flat panel head CT Left carotid stenting with cerebral protection device COMPARISON:  CT/CT angiogram of December 13, 2020 MEDICATIONS: IV cangrelor utilized prior to  stent deployment ANESTHESIA/SEDATION: The procedure was performed in the general anesthesia. CONTRAST:  95 mL of Omnipaque 240 milligram/mL FLUOROSCOPY TIME:  Fluoroscopy Time: 38 minutes 54 seconds (970 mGy). COMPLICATIONS: None immediate. TECHNIQUE: Informed written consent was not obtained given patient's severe  aphasia and no reachable healthcare proxy or next of kin available. Maximal Sterile Barrier Technique was utilized including caps, mask, sterile gowns, sterile gloves, sterile drape, hand hygiene and skin antiseptic. A timeout was performed prior to the initiation of the procedure. The right groin was prepped and draped in the usual sterile fashion. Using a micropuncture kit and the modified Seldinger technique, access was gained to the right common femoral artery and an 8 French sheath was placed. Real-time ultrasound guidance was utilized for vascular access including the acquisition of a permanent ultrasound image documenting patency of the accessed vessel. Under fluoroscopy, an 8 [place] Walrus balloon guide catheter was navigated over a 6 [place] Berenstein 2 catheter and a 0.035" Terumo Glidewire into the aortic arch. The catheter was placed into the left common carotid artery. Frontal and lateral angiograms of the neck were obtained. Under biplane roadmap, a zoom 71 aspiration catheter was navigated over a phenom 21 microcatheter and a Aristotle 14 microguidewire into the cavernous segment of the left ICA. The balloon guide catheter was advanced into the upper cervical segment of the left ICA. Frontal and lateral angiograms of the head were obtained via aspiration catheter contrast injection. FINDINGS: 1. Occlusion of the left ICA in the neck at the level of the bulb. 2. Occlusion of a proximal left M2/MCA middle division branch. 3. Nonocclusive filling defect within a left A4/ACA branch. 4. Patent right common femoral artery. PROCEDURE: Magnified frontal and lateral angiograms of the head were  obtained in used as biplane roadmap. The microcatheter was then navigated over the wire into the left M3/MCA middle division branch. Then, a 4 x 40 mm solitaire stent retriever was deployed spanning the M2 and proximal M3 segment. The device was allowed to intercalated with the clot for 4 minutes. The microcatheter was removed. The aspiration catheter was advanced to the level of occlusion and connected to a penumbra aspiration pump. The guiding catheter balloon was inflated. The thrombectomy device and aspiration catheter were removed under constant aspiration. Follow-up left ICA angiogram showed complete recanalization the left MCA vascular tree. The guiding catheter was then retracted into the left common carotid artery. Frontal and lateral angiograms of the neck were obtained. Patent left ICA noted at the bulb with significant luminal irregularity and filling defect. Left common carotid artery angiograms with frontal and lateral of the head showed for opacification of the left MCA branches which remain open. Flat panel CT of the head was obtained and post processed in a separate workstation with concurrent attending physician supervision. Selected images were sent to PACS. No evidence of hemorrhagic complication noted. Follow-up left common carotid artery angiogram showed worsening of the left ICA stenosis at the bulb with near occlusion and slow flow distally. Patient was loaded on cangrelor in anticipation to stent the placement. Under biplane roadmap, a 4-7 Emboshield NAV6 cerebral protection device was navigated into the distal cervical segment of the left ICA. Subsequently, a 9-7 x 40 mm XACT carotid stent was deployed across the area of stenosis in the left ICA. Then, in stent stenosis was performed with a 4 x 30 mm Viatrac balloon. The cerebral protection device was recaptured. Follow-up left ICA angiogram showed good position of the stent with significantly improved anterograde flow. Left ICA angiograms  with frontal and lateral views of the head showed no evidence of thromboembolic complication with significant improvement of the anterograde flow. Delayed left ICA angiogram showed no evidence of in stent clot formation. Delayed cranial angiograms appear stable. The  system was subsequently withdrawn. Left common femoral artery angiograms were obtained with frontal and lateral views. The puncture is at the level of the common femoral artery which shows mild atherosclerotic change with adequate caliber for closure device utilization. The femoral sheath thus exchanged for a Perclose ProGlide which was utilized for access closure. Immediate hemostasis was achieved. IMPRESSION: 1. Successful mechanical thrombectomy for treatment of a left M2/MCA occlusion with a single pass of combined aspiration plus stent retriever achieving complete recanalization (TICI3). 2. Stenting and angioplasty of left ICA severe stenosis with cerebral protection device. PLAN: Patient is to continue on cangrelor drip until follow-up imaging. Then, transition to dual anti-platelet therapy with aspirin and Brilinta. Electronically Signed   By: [name] M.D.   On: 12/14/2020 12:36   IR CT Head Ltd  Result Date: 12/14/2020 INDICATION: 65 year old male with past medical history significant for tobacco use, alcohol and possible drug abuse. Patient presented severe aphasia and right-sided weakness. Initial NIHSS 22 later improving to 10. Head CT showed hypodensity in the left posterior frontal region (aspects 9) CT/CT angiogram of the head and neck showed a left ICA occlusion at the bulb with intracranial reconstitution in a left M3/MCA branch occlusion. Additionally patient head high-grade stenosis of the cervical right ICA and mild stenosis at the origin of the bilateral vertebral arteries. CT perfusion showed a core infarct of 16 mL with a 58 mL penumbra. He was then transferred to our service for a diagnostic cerebral  angiogram with mechanical thrombectomy. EXAM: Ultrasound-guided vascular access Diagnostic cerebral angiogram Mechanical thrombectomy Flat panel head CT Left carotid stenting with cerebral protection device COMPARISON:  CT/CT angiogram of December 13, 2020 MEDICATIONS: IV cangrelor utilized prior to stent deployment ANESTHESIA/SEDATION: The procedure was performed in the general anesthesia. CONTRAST:  95 mL of Omnipaque 240 milligram/mL FLUOROSCOPY TIME:  Fluoroscopy Time: 38 minutes 54 seconds (970 mGy). COMPLICATIONS: None immediate. TECHNIQUE: Informed written consent was not obtained given patient's severe aphasia and no reachable healthcare proxy or next of kin available. Maximal Sterile Barrier Technique was utilized including caps, mask, sterile gowns, sterile gloves, sterile drape, hand hygiene and skin antiseptic. A timeout was performed prior to the initiation of the procedure. The right groin was prepped and draped in the usual sterile fashion. Using a micropuncture kit and the modified Seldinger technique, access was gained to the right common femoral artery and an 8 French sheath was placed. Real-time ultrasound guidance was utilized for vascular access including the acquisition of a permanent ultrasound image documenting patency of the accessed vessel. Under fluoroscopy, an 8 [place] Walrus balloon guide catheter was navigated over a 6 [place] Berenstein 2 catheter and a 0.035" Terumo Glidewire into the aortic arch. The catheter was placed into the left common carotid artery. Frontal and lateral angiograms of the neck were obtained. Under biplane roadmap, a zoom 71 aspiration catheter was navigated over a phenom 21 microcatheter and a Aristotle 14 microguidewire into the cavernous segment of the left ICA. The balloon guide catheter was advanced into the upper cervical segment of the left ICA. Frontal and lateral angiograms of the head were obtained via aspiration catheter contrast injection. FINDINGS: 1.  Occlusion of the left ICA in the neck at the level of the bulb. 2. Occlusion of a proximal left M2/MCA middle division branch. 3. Nonocclusive filling defect within a left A4/ACA branch. 4. Patent right common femoral artery. PROCEDURE: Magnified frontal and lateral angiograms of the head were obtained in used as biplane roadmap.  The microcatheter was then navigated over the wire into the left M3/MCA middle division branch. Then, a 4 x 40 mm solitaire stent retriever was deployed spanning the M2 and proximal M3 segment. The device was allowed to intercalated with the clot for 4 minutes. The microcatheter was removed. The aspiration catheter was advanced to the level of occlusion and connected to a penumbra aspiration pump. The guiding catheter balloon was inflated. The thrombectomy device and aspiration catheter were removed under constant aspiration. Follow-up left ICA angiogram showed complete recanalization the left MCA vascular tree. The guiding catheter was then retracted into the left common carotid artery. Frontal and lateral angiograms of the neck were obtained. Patent left ICA noted at the bulb with significant luminal irregularity and filling defect. Left common carotid artery angiograms with frontal and lateral of the head showed for opacification of the left MCA branches which remain open. Flat panel CT of the head was obtained and post processed in a separate workstation with concurrent attending physician supervision. Selected images were sent to PACS. No evidence of hemorrhagic complication noted. Follow-up left common carotid artery angiogram showed worsening of the left ICA stenosis at the bulb with near occlusion and slow flow distally. Patient was loaded on cangrelor in anticipation to stent the placement. Under biplane roadmap, a 4-7 Emboshield NAV6 cerebral protection device was navigated into the distal cervical segment of the left ICA. Subsequently, a 9-7 x 40 mm XACT carotid stent was  deployed across the area of stenosis in the left ICA. Then, in stent stenosis was performed with a 4 x 30 mm Viatrac balloon. The cerebral protection device was recaptured. Follow-up left ICA angiogram showed good position of the stent with significantly improved anterograde flow. Left ICA angiograms with frontal and lateral views of the head showed no evidence of thromboembolic complication with significant improvement of the anterograde flow. Delayed left ICA angiogram showed no evidence of in stent clot formation. Delayed cranial angiograms appear stable. The system was subsequently withdrawn. Left common femoral artery angiograms were obtained with frontal and lateral views. The puncture is at the level of the common femoral artery which shows mild atherosclerotic change with adequate caliber for closure device utilization. The femoral sheath thus exchanged for a Perclose ProGlide which was utilized for access closure. Immediate hemostasis was achieved. IMPRESSION: 1. Successful mechanical thrombectomy for treatment of a left M2/MCA occlusion with a single pass of combined aspiration plus stent retriever achieving complete recanalization (TICI3). 2. Stenting and angioplasty of left ICA severe stenosis with cerebral protection device. PLAN: Patient is to continue on cangrelor drip until follow-up imaging. Then, transition to dual anti-platelet therapy with aspirin and Brilinta. Electronically Signed   By: [name] M.D.   On: 12/14/2020 12:36   IR US Guide Vasc Access Right  Result Date: 12/14/2020 INDICATION: 65 year old male with past medical history significant for tobacco use, alcohol and possible drug abuse. Patient presented severe aphasia and right-sided weakness. Initial NIHSS 22 later improving to 10. Head CT showed hypodensity in the left posterior frontal region (aspects 9) CT/CT angiogram of the head and neck showed a left ICA occlusion at the bulb with intracranial  reconstitution in a left M3/MCA branch occlusion. Additionally patient head high-grade stenosis of the cervical right ICA and mild stenosis at the origin of the bilateral vertebral arteries. CT perfusion showed a core infarct of 16 mL with a 58 mL penumbra. He was then transferred to our service for a diagnostic cerebral angiogram with mechanical  thrombectomy. EXAM: Ultrasound-guided vascular access Diagnostic cerebral angiogram Mechanical thrombectomy Flat panel head CT Left carotid stenting with cerebral protection device COMPARISON:  CT/CT angiogram of December 13, 2020 MEDICATIONS: IV cangrelor utilized prior to stent deployment ANESTHESIA/SEDATION: The procedure was performed in the general anesthesia. CONTRAST:  95 mL of Omnipaque 240 milligram/mL FLUOROSCOPY TIME:  Fluoroscopy Time: 38 minutes 54 seconds (970 mGy). COMPLICATIONS: None immediate. TECHNIQUE: Informed written consent was not obtained given patient's severe aphasia and no reachable healthcare proxy or next of kin available. Maximal Sterile Barrier Technique was utilized including caps, mask, sterile gowns, sterile gloves, sterile drape, hand hygiene and skin antiseptic. A timeout was performed prior to the initiation of the procedure. The right groin was prepped and draped in the usual sterile fashion. Using a micropuncture kit and the modified Seldinger technique, access was gained to the right common femoral artery and an 8 French sheath was placed. Real-time ultrasound guidance was utilized for vascular access including the acquisition of a permanent ultrasound image documenting patency of the accessed vessel. Under fluoroscopy, an 8 [place] Walrus balloon guide catheter was navigated over a 6 [place] Berenstein 2 catheter and a 0.035" Terumo Glidewire into the aortic arch. The catheter was placed into the left common carotid artery. Frontal and lateral angiograms of the neck were obtained. Under biplane roadmap, a zoom 71 aspiration catheter  was navigated over a phenom 21 microcatheter and a Aristotle 14 microguidewire into the cavernous segment of the left ICA. The balloon guide catheter was advanced into the upper cervical segment of the left ICA. Frontal and lateral angiograms of the head were obtained via aspiration catheter contrast injection. FINDINGS: 1. Occlusion of the left ICA in the neck at the level of the bulb. 2. Occlusion of a proximal left M2/MCA middle division branch. 3. Nonocclusive filling defect within a left A4/ACA branch. 4. Patent right common femoral artery. PROCEDURE: Magnified frontal and lateral angiograms of the head were obtained in used as biplane roadmap. The microcatheter was then navigated over the wire into the left M3/MCA middle division branch. Then, a 4 x 40 mm solitaire stent retriever was deployed spanning the M2 and proximal M3 segment. The device was allowed to intercalated with the clot for 4 minutes. The microcatheter was removed. The aspiration catheter was advanced to the level of occlusion and connected to a penumbra aspiration pump. The guiding catheter balloon was inflated. The thrombectomy device and aspiration catheter were removed under constant aspiration. Follow-up left ICA angiogram showed complete recanalization the left MCA vascular tree. The guiding catheter was then retracted into the left common carotid artery. Frontal and lateral angiograms of the neck were obtained. Patent left ICA noted at the bulb with significant luminal irregularity and filling defect. Left common carotid artery angiograms with frontal and lateral of the head showed for opacification of the left MCA branches which remain open. Flat panel CT of the head was obtained and post processed in a separate workstation with concurrent attending physician supervision. Selected images were sent to PACS. No evidence of hemorrhagic complication noted. Follow-up left common carotid artery angiogram showed worsening of the left ICA  stenosis at the bulb with near occlusion and slow flow distally. Patient was loaded on cangrelor in anticipation to stent the placement. Under biplane roadmap, a 4-7 Emboshield NAV6 cerebral protection device was navigated into the distal cervical segment of the left ICA. Subsequently, a 9-7 x 40 mm XACT carotid stent was deployed across the area of stenosis in the left  ICA. Then, in stent stenosis was performed with a 4 x 30 mm Viatrac balloon. The cerebral protection device was recaptured. Follow-up left ICA angiogram showed good position of the stent with significantly improved anterograde flow. Left ICA angiograms with frontal and lateral views of the head showed no evidence of thromboembolic complication with significant improvement of the anterograde flow. Delayed left ICA angiogram showed no evidence of in stent clot formation. Delayed cranial angiograms appear stable. The system was subsequently withdrawn. Left common femoral artery angiograms were obtained with frontal and lateral views. The puncture is at the level of the common femoral artery which shows mild atherosclerotic change with adequate caliber for closure device utilization. The femoral sheath thus exchanged for a Perclose ProGlide which was utilized for access closure. Immediate hemostasis was achieved. IMPRESSION: 1. Successful mechanical thrombectomy for treatment of a left M2/MCA occlusion with a single pass of combined aspiration plus stent retriever achieving complete recanalization (TICI3). 2. Stenting and angioplasty of left ICA severe stenosis with cerebral protection device. PLAN: Patient is to continue on cangrelor drip until follow-up imaging. Then, transition to dual anti-platelet therapy with aspirin and Brilinta. Electronically Signed   By: [name] M.D.   On: 12/14/2020 12:36   CT Code Stroke Cerebral Perfusion with contrast  Result Date: 12/13/2020 CLINICAL DATA:  Acute presentation with left MCA  syndrome. EXAM: CT ANGIOGRAPHY HEAD AND NECK CT PERFUSION BRAIN TECHNIQUE: Multidetector CT imaging of the head and neck was performed using the standard protocol during bolus administration of intravenous contrast. Multiplanar CT image reconstructions and MIPs were obtained to evaluate the vascular anatomy. Carotid stenosis measurements (when applicable) are obtained utilizing NASCET criteria, using the distal internal carotid diameter as the denominator. Multiphase CT imaging of the brain was performed following IV bolus contrast injection. Subsequent parametric perfusion maps were calculated using RAPID software. CONTRAST:  150m OMNIPAQUE IOHEXOL 350 MG/ML SOLN COMPARISON:  Head CT earlier tonight. FINDINGS: CTA NECK FINDINGS Aortic arch: Aortic atherosclerotic calcification. Branching pattern is normal without origin stenosis. Right carotid system: Common carotid artery widely patent to the bifurcation. Soft and calcified plaque at the carotid bifurcation and ICA bulb. Minimal diameter at distal bulb is 1 mm or less, with a pronounced web-like stenosis. Flow is present distal to that. This is consistent with an 80% or greater stenosis. Left carotid system: Common carotid artery is patent to the bifurcation region. There is occlusion of the ICA at the origin. ECA is widely patent. No flow seen in the left internal carotid artery in till the siphon region, where there is reconstitution, probably from a combination of external internal collaterals and flow through a patent anterior and posterior communicating artery. Vertebral arteries: Atherosclerotic plaque at the right vertebral artery origin with 30-50% stenosis. The vessel is widely patent beyond that through the cervical region. Atherosclerotic plaque at the left vertebral artery origin with a 30-50% stenosis. Vessel is widely patent beyond that through the cervical region. Skeleton: Ordinary cervical spondylosis. Other neck: No mass or lymphadenopathy.  Upper chest: Normal Review of the MIP images confirms the above findings CTA HEAD FINDINGS Anterior circulation: Right internal carotid artery is patent through the siphon region without stenosis. Right anterior and middle cerebral vessels appear patent and normal. As noted above, left internal carotid artery reconstitutes in the siphon region probably from a combination of external to internal collaterals and patent communicating arteries. Flow is present in the anterior and middle cerebral vessels. I cannot clearly identify an occluded distal  MCA branch. Posterior circulation: Both vertebral arteries are patent through the foramen magnum to the basilar. Some atherosclerotic narrowing and irregularity of the V4 segments, right more than left. No basilar stenosis. Posterior circulation branch vessels show flow. Venous sinuses: Patent and normal. Anatomic variants: None significant. Review of the MIP images confirms the above findings CT Brain Perfusion Findings: ASPECTS: In retrospect, probably 9, with some gray-white differentiation loss in the M4 region. CBF (<30%) Volume: 79m Perfusion (Tmax>6.0s) volume: 554mMismatch Volume: 4265mnfarction Location:Left frontoparietal junction region. 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 Reconstitution in the siphon probably from a combination of external to internal collaterals and patent communicating arteries. I cannot clearly identify the occluded MCA branch vessel, though there must be one, probably in the M3 region. 16 cc completed infarction in the left frontoparietal junction region. Additional 42 cc at risk brain surrounding that region. 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 30-50 % stenoses of both vertebral artery origins. Results discussed with Dr. [place] approximately 0220 hours Electronically Signed   By: MarNelson ChimesD.   On: 12/13/2020 02:36   DG Swallowing Func-Speech  Pathology  Result Date: 12/14/2020 Objective Swallowing Evaluation: Type of Study: MBS-Modified Barium Swallow Study  Patient Details Name: Dustin Golden: [id number] of Birth: [birth year]day's Date: 12/14/2020 Time: SLP Start Time (ACUTE ONLY): 1315 -SLP Stop Time (ACUTE ONLY): 1335 SLP Time Calculation (min) (ACUTE ONLY): 20 min Past Medical History: No past medical history on file. Past Surgical History: Past Surgical History: Procedure Laterality Date . APPENDECTOMY   . gastric ulcer surgery   . IR CT HEAD LTD  12/13/2020 . IR INTRAVSC STENT CERV CAROTID W/EMB-PROT MOD SED INCL ANGIO  12/14/2020 . IR PERCUTANEOUS ART THROMBECTOMY/INFUSION INTRACRANIAL INC DIAG ANGIO  12/13/2020 . IR US [place] VASC ACCESS RIGHT  12/13/2020 . RADIOLOGY WITH ANESTHESIA N/A 12/13/2020  Procedure: IR WITH ANESTHESIA;  Surgeon: DevLuanne BrasD;  [facility] [place];  Laterality: N/A; HPI: [age]o. male with past medical history significant for ongoing tobacco abuse, alcohol use and drug use (marijuana and possibly other substances) per EMS report per bystander report. 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 Small areas of acute infarct in the frontal lobes bilaterally. Pt s/p successful mechanical thrombectomy of the proximal left M2's/MCA middle division occlusion.  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 Right sided oral motor deficits from CVA allowed for frequent right sided anterior spillage, reduced bolus cohesion, right sided oral residuals, and premature spillage of bolus. Pharyngeally pt with reduced timing and efficiency of laryngeal vestibule closure with an instance of trace transient penetration of thins and one instance of trace  silent aspiration of thins by cup on initial series. Subsequent trials of thins via cup and straw were without penetration or aspiration (including mixed consistency with barium tablet series). Mild vallecular and pyriform sinsus residuasl noted with POs, clearing with reflexive swallows. Recommend dysphagia 3 (mechanical soft) and thin liquids with meds in puree. SLP to follow up. SLP Visit Diagnosis Dysphagia, oropharyngeal phase (R13.12) Attention and concentration deficit following -- Frontal lobe and executive function deficit following -- Impact on safety and function Mild aspiration risk;Moderate aspiration risk   CHL IP TREATMENT  RECOMMENDATION 12/14/2020 Treatment Recommendations Therapy as outlined in treatment plan below   Prognosis 12/14/2020 Prognosis for Safe Diet Advancement Good Barriers to Reach Goals Time post onset Barriers/Prognosis Comment -- [facility] IP DIET RECOMMENDATION 12/14/2020 SLP Diet Recommendations Dysphagia 3 (Mech soft) solids;Thin liquid Liquid Administration via Cup;Straw Medication Administration Whole meds with puree Compensations Minimize environmental distractions;Slow rate;Small sips/bites;Lingual sweep for clearance of pocketing;Clear throat intermittently Postural Changes Remain semi-upright after after feeds/meals (Comment);Seated upright at 90 degrees   CHL IP OTHER RECOMMENDATIONS 12/14/2020 Recommended Consults -- Oral Care Recommendations Oral care BID Other Recommendations --   CH[facility] IP FOLLOW UP RECOMMENDATIONS 12/14/2020 Follow up Recommendations Inpatient Rehab   CHL IP FREQUENCY AND DURATION 12/14/2020 Speech Therapy Frequency (ACUTE ONLY) min 2x/week Treatment Duration 2 weeks      CHL IP ORAL PHASE 12/14/2020 Oral Phase Impaired Oral - Pudding Teaspoon -- Oral - Pudding Cup -- Oral - Honey Teaspoon -- Oral - Honey Cup -- Oral - Nectar Teaspoon -- Oral - Nectar Cup Right pocketing in lateral sulci;Lingual/palatal residue;Right anterior bolus loss Oral - Nectar Straw -- Oral - Thin  Teaspoon -- Oral - Thin Cup Lingual/palatal residue;Right anterior bolus loss;Right pocketing in lateral sulci Oral - Thin Straw Right anterior bolus loss;Lingual/palatal residue Oral - Puree Lingual/palatal residue;Right pocketing in lateral sulci;Delayed oral transit Oral - Mech Soft Right pocketing in lateral sulci;Lingual/palatal residue;Decreased bolus cohesion;Delayed oral transit Oral - Regular -- Oral - Multi-Consistency -- Oral - Pill Lingual/palatal residue;Right anterior bolus loss;Delayed oral transit Oral Phase - Comment --  CHL IP PHARYNGEAL PHASE 12/14/2020 Pharyngeal Phase Impaired Pharyngeal- Pudding Teaspoon -- Pharyngeal -- Pharyngeal- Pudding Cup -- Pharyngeal -- Pharyngeal- Honey Teaspoon -- Pharyngeal -- Pharyngeal- Honey Cup -- Pharyngeal -- Pharyngeal- Nectar Teaspoon -- Pharyngeal -- Pharyngeal- Nectar Cup Delayed swallow initiation-vallecula;Pharyngeal residue - pyriform;Pharyngeal residue - valleculae;Reduced laryngeal elevation;Reduced tongue base retraction Pharyngeal -- Pharyngeal- Nectar Straw -- Pharyngeal -- Pharyngeal- Thin Teaspoon -- Pharyngeal -- Pharyngeal- Thin Cup Reduced laryngeal elevation;Reduced tongue base retraction;Pharyngeal residue - valleculae;Pharyngeal residue - pyriform;Penetration/Aspiration before swallow;Penetration/Aspiration during swallow;Reduced epiglottic inversion;Reduced airway/laryngeal closure;Delayed swallow initiation-pyriform sinuses Pharyngeal Material enters airway, passes BELOW cords without attempt by patient to eject out (silent aspiration);Material enters airway, remains ABOVE vocal cords and not ejected out;Material does not enter airway Pharyngeal- Thin Straw Pharyngeal residue - valleculae;Pharyngeal residue - pyriform;Delayed swallow initiation-pyriform sinuses;Reduced tongue base retraction;Reduced laryngeal elevation Pharyngeal -- Pharyngeal- Puree Reduced tongue base retraction;Reduced laryngeal elevation;Delayed swallow  initiation-vallecula;Pharyngeal residue - valleculae;Pharyngeal residue - pyriform Pharyngeal -- Pharyngeal- Mechanical Soft Delayed swallow initiation-vallecula;Reduced laryngeal elevation;Reduced tongue base retraction;Pharyngeal residue - valleculae;Pharyngeal residue - pyriform Pharyngeal -- Pharyngeal- Regular -- Pharyngeal -- Pharyngeal- Multi-consistency -- Pharyngeal -- Pharyngeal- Pill Pharyngeal residue - pyriform;Pharyngeal residue - valleculae;Reduced tongue base retraction;Reduced laryngeal elevation Pharyngeal -- Pharyngeal Comment --  CHL IP CERVICAL ESOPHAGEAL PHASE 12/14/2020 Cervical Esophageal Phase WFL Pudding Teaspoon -- Pudding Cup -- Honey Teaspoon -- Honey Cup -- Nectar Teaspoon -- Nectar Cup -- Nectar Straw -- Thin Teaspoon -- Thin Cup -- Thin Straw -- Puree -- Mechanical Soft -- Regular -- Multi-consistency -- Pill -- Cervical Esophageal Comment -- Hayden Rasmussen MA, CCC-SLP Acute Rehabilitation Services 12/14/2020, 2:09 PM              ECHOCARDIOGRAM COMPLETE  Result Date: 12/13/2020    ECHOCARDIOGRAM REPORT   Patient Name:   Dustin Golden Date of Exam: 12/13/2020 Medical Rec #:  [id number]    Height:       76.0 in Accession #:  [id number]   Weight:       194.7 lb Date of Birth:  1956-08-27    BSA:          2.190 m Patient Age:    65 years     BP:           113/65 mmHg Patient Gender: M            HR:           68 bpm. Exam Location:  Inpatient Procedure: 2D Echo, Cardiac Doppler, Color Doppler and Intracardiac            Opacification Agent Indications:    Stroke  History:        Patient has no prior history of Echocardiogram examinations.                 Stroke; Risk Factors:Current Smoker. ETOH.  Sonographer:    Roseanna Rainbow RDCS Referring Phys: [id number] [place]  Sonographer Comments: Image acquisition challenging due to respiratory motion. Images extremely respiratory. IMPRESSIONS  1. Left ventricular ejection fraction, by estimation, is 35 to 40%. The left ventricle has moderately  decreased function. The left ventricle demonstrates global hypokinesis. There is moderate concentric left ventricular hypertrophy. Left ventricular diastolic parameters are consistent with Grade I diastolic dysfunction (impaired relaxation).  2. Right ventricular systolic function is mildly reduced. The right ventricular size is mildly enlarged. There is normal pulmonary artery systolic pressure. The estimated right ventricular systolic pressure is 12.2 mmHg.  3. The mitral valve is normal in structure. Trivial mitral valve regurgitation. No evidence of mitral stenosis. Moderate mitral annular calcification.  4. The aortic valve is normal in structure. Aortic valve regurgitation is not visualized. No aortic stenosis is present.  5. The inferior vena cava is normal in size with greater than 50% respiratory variability, suggesting right atrial pressure of 3 mmHg. FINDINGS  Left Ventricle: Left ventricular ejection fraction, by estimation, is 35 to 40%. The left ventricle has moderately decreased function. The left ventricle demonstrates global hypokinesis. Definity contrast agent was given IV to delineate the left ventricular endocardial borders. The left ventricular internal cavity size was normal in size. There is moderate concentric left ventricular hypertrophy. Left ventricular diastolic parameters are consistent with Grade I diastolic dysfunction (impaired relaxation). Normal left ventricular filling pressure. Right Ventricle: The right ventricular size is mildly enlarged. No increase in right ventricular wall thickness. Right ventricular systolic function is mildly reduced. There is normal pulmonary artery systolic pressure. The tricuspid regurgitant velocity  is 2.00 m/s, and with an assumed right atrial pressure of 3 mmHg, the estimated right ventricular systolic pressure is 48.2 mmHg. Left Atrium: Left atrial size was normal in size. Right Atrium: Right atrial size was normal in size. Pericardium: There is no  evidence of pericardial effusion. Mitral Valve: The mitral valve is normal in structure. There is mild thickening of the mitral valve leaflet(s). Moderate mitral annular calcification. Trivial mitral valve regurgitation. No evidence of mitral valve stenosis. Tricuspid Valve: The tricuspid valve is normal in structure. Tricuspid valve regurgitation is mild . No evidence of tricuspid stenosis. Aortic Valve: The aortic valve is normal in structure. Aortic valve regurgitation is not visualized. No aortic stenosis is present. Pulmonic Valve: The pulmonic valve was normal in structure. Pulmonic valve regurgitation is not visualized. No evidence of pulmonic stenosis. Aorta: The aortic root is normal in size and structure. Venous: The inferior vena cava is normal in size with greater than 50% respiratory variability, suggesting  right atrial pressure of 3 mmHg. IAS/Shunts: No atrial level shunt detected by color flow Doppler.  LEFT VENTRICLE PLAX 2D LVIDd:         5.30 cm      Diastology LVIDs:         4.50 cm      LV e' medial:    5.22 cm/s LV PW:         1.60 cm      LV E/e' medial:  11.4 LV IVS:        1.60 cm      LV e' lateral:   5.92 cm/s LVOT diam:     1.80 cm      LV E/e' lateral: 10.1 LV SV:         42 LV SV Index:   19 LVOT Area:     2.54 cm  LV Volumes (MOD) LV vol d, MOD A2C: 115.0 ml LV vol d, MOD A4C: 142.0 ml LV vol s, MOD A2C: 66.8 ml LV vol s, MOD A4C: 105.0 ml LV SV MOD A2C:     48.2 ml LV SV MOD A4C:     142.0 ml LV SV MOD BP:      44.8 ml RIGHT VENTRICLE            IVC RV S prime:     7.79 cm/s  IVC diam: 2.40 cm TAPSE (M-mode): 1.6 cm LEFT ATRIUM             Index       RIGHT ATRIUM           Index LA diam:        3.10 cm 1.42 cm/m  RA Area:     17.30 cm LA Vol (A2C):   39.6 ml 18.08 ml/m RA Volume:   50.40 ml  23.01 ml/m LA Vol (A4C):   44.2 ml 20.18 ml/m LA Biplane Vol: 44.3 ml 20.23 ml/m  AORTIC VALVE LVOT Vmax:   90.20 cm/s LVOT Vmean:  62.700 cm/s LVOT VTI:    0.167 m  AORTA Ao Root diam:  3.40 cm MITRAL VALVE               TRICUSPID VALVE MV Area (PHT): 3.21 cm    TR Peak grad:   16.0 mmHg MV Decel Time: 236 msec    TR Vmax:        200.00 cm/s MV E velocity: 59.70 cm/s MV A velocity: 82.00 cm/s  SHUNTS MV E/A ratio:  0.73        Systemic VTI:  0.17 m                            Systemic Diam: 1.80 cm Ena Dawley MD Electronically signed by Ena Dawley MD Signature Date/Time: 12/13/2020/1:53:15 PM    Final    IR PERCUTANEOUS ART THROMBECTOMY/INFUSION INTRACRANIAL INC DIAG ANGIO  Result Date: 12/14/2020 INDICATION: 65 year old male with past medical history significant for tobacco use, alcohol and possible drug abuse. Patient presented severe aphasia and right-sided weakness. Initial NIHSS 22 later improving to 10. Head CT showed hypodensity in the left posterior frontal region (aspects 9) CT/CT angiogram of the head and neck showed a left ICA occlusion at the bulb with intracranial reconstitution in a left M3/MCA branch occlusion. Additionally patient head high-grade stenosis of the cervical right ICA and mild stenosis at the origin of the bilateral vertebral arteries. CT perfusion showed a core infarct  of 16 mL with a 58 mL penumbra. He was then transferred to our service for a diagnostic cerebral angiogram with mechanical thrombectomy. EXAM: Ultrasound-guided vascular access Diagnostic cerebral angiogram Mechanical thrombectomy Flat panel head CT Left carotid stenting with cerebral protection device COMPARISON:  CT/CT angiogram of December 13, 2020 MEDICATIONS: IV cangrelor utilized prior to stent deployment ANESTHESIA/SEDATION: The procedure was performed in the general anesthesia. CONTRAST:  95 mL of Omnipaque 240 milligram/mL FLUOROSCOPY TIME:  Fluoroscopy Time: 38 minutes 54 seconds (970 mGy). COMPLICATIONS: None immediate. TECHNIQUE: Informed written consent was not obtained given patient's severe aphasia and no reachable healthcare proxy or next of kin available. Maximal Sterile  Barrier Technique was utilized including caps, mask, sterile gowns, sterile gloves, sterile drape, hand hygiene and skin antiseptic. A timeout was performed prior to the initiation of the procedure. The right groin was prepped and draped in the usual sterile fashion. Using a micropuncture kit and the modified Seldinger technique, access was gained to the right common femoral artery and an 8 French sheath was placed. Real-time ultrasound guidance was utilized for vascular access including the acquisition of a permanent ultrasound image documenting patency of the accessed vessel. Under fluoroscopy, an 8 [place] Walrus balloon guide catheter was navigated over a 6 [place] Berenstein 2 catheter and a 0.035" Terumo Glidewire into the aortic arch. The catheter was placed into the left common carotid artery. Frontal and lateral angiograms of the neck were obtained. Under biplane roadmap, a zoom 71 aspiration catheter was navigated over a phenom 21 microcatheter and a Aristotle 14 microguidewire into the cavernous segment of the left ICA. The balloon guide catheter was advanced into the upper cervical segment of the left ICA. Frontal and lateral angiograms of the head were obtained via aspiration catheter contrast injection. FINDINGS: 1. Occlusion of the left ICA in the neck at the level of the bulb. 2. Occlusion of a proximal left M2/MCA middle division branch. 3. Nonocclusive filling defect within a left A4/ACA branch. 4. Patent right common femoral artery. PROCEDURE: Magnified frontal and lateral angiograms of the head were obtained in used as biplane roadmap. The microcatheter was then navigated over the wire into the left M3/MCA middle division branch. Then, a 4 x 40 mm solitaire stent retriever was deployed spanning the M2 and proximal M3 segment. The device was allowed to intercalated with the clot for 4 minutes. The microcatheter was removed. The aspiration catheter was advanced to the level of occlusion and connected  to a penumbra aspiration pump. The guiding catheter balloon was inflated. The thrombectomy device and aspiration catheter were removed under constant aspiration. Follow-up left ICA angiogram showed complete recanalization the left MCA vascular tree. The guiding catheter was then retracted into the left common carotid artery. Frontal and lateral angiograms of the neck were obtained. Patent left ICA noted at the bulb with significant luminal irregularity and filling defect. Left common carotid artery angiograms with frontal and lateral of the head showed for opacification of the left MCA branches which remain open. Flat panel CT of the head was obtained and post processed in a separate workstation with concurrent attending physician supervision. Selected images were sent to PACS. No evidence of hemorrhagic complication noted. Follow-up left common carotid artery angiogram showed worsening of the left ICA stenosis at the bulb with near occlusion and slow flow distally. Patient was loaded on cangrelor in anticipation to stent the placement. Under biplane roadmap, a 4-7 Emboshield NAV6 cerebral protection device was navigated into the distal cervical segment of  the left ICA. Subsequently, a 9-7 x 40 mm XACT carotid stent was deployed across the area of stenosis in the left ICA. Then, in stent stenosis was performed with a 4 x 30 mm Viatrac balloon. The cerebral protection device was recaptured. Follow-up left ICA angiogram showed good position of the stent with significantly improved anterograde flow. Left ICA angiograms with frontal and lateral views of the head showed no evidence of thromboembolic complication with significant improvement of the anterograde flow. Delayed left ICA angiogram showed no evidence of in stent clot formation. Delayed cranial angiograms appear stable. The system was subsequently withdrawn. Left common femoral artery angiograms were obtained with frontal and lateral views. The puncture is at  the level of the common femoral artery which shows mild atherosclerotic change with adequate caliber for closure device utilization. The femoral sheath thus exchanged for a Perclose ProGlide which was utilized for access closure. Immediate hemostasis was achieved. IMPRESSION: 1. Successful mechanical thrombectomy for treatment of a left M2/MCA occlusion with a single pass of combined aspiration plus stent retriever achieving complete recanalization (TICI3). 2. Stenting and angioplasty of left ICA severe stenosis with cerebral protection device. PLAN: Patient is to continue on cangrelor drip until follow-up imaging. Then, transition to dual anti-platelet therapy with aspirin and Brilinta. Electronically Signed   By: [name] M.D.   On: 12/14/2020 12:36   CT HEAD CODE STROKE WO CONTRAST  Result Date: 12/13/2020 CLINICAL DATA:  Code stroke. Right sided weakness, facial droop and slurred speech. EXAM: CT HEAD WITHOUT CONTRAST TECHNIQUE: Contiguous axial images were obtained from the base of the skull through the vertex without intravenous contrast. COMPARISON:  None. FINDINGS: Brain: No abnormality affects the brainstem or cerebellum. Cerebral hemispheres are normal for age. No advanced atrophy. No sign of old or acute infarction, mass lesion, hemorrhage, hydrocephalus or extra-axial collection. Vascular: No abnormal vascular finding. Skull: Normal Sinuses/Orbits: Clear/normal Other: None ASPECTS (Skillman Stroke Program Early CT Score) - Ganglionic level infarction (caudate, lentiform nuclei, internal capsule, insula, M1-M3 cortex): 7 - Supraganglionic infarction (M4-M6 cortex): 3 Total score (0-10 with 10 being normal): 10 IMPRESSION: 1. Normal head CT. 2. Aspects 10. 3. These results were communicated to Dr. Curly Shores at 2:00 amon 2/28/2022by text page via the [facility] messaging system. Electronically Signed   By: Nelson Chimes M.D.   On: 12/13/2020 02:02    Labs:  CBC: Recent Labs     12/13/20 0151 12/13/20 0200 12/14/20 0412 12/15/20 0235  WBC 8.1  --  9.4 9.7  HGB 14.4 15.0 12.6* 12.2*  HCT 43.3 44.0 36.4* 37.0*  PLT 213  --  201 197    COAGS: Recent Labs    12/13/20 0151  INR 0.9  APTT 28    BMP: Recent Labs    12/13/20 0151 12/13/20 0200 12/14/20 0412 12/15/20 0235  NA 139 142 138 137  K 4.0 4.0 3.5 3.7  CL 107 106 108 106  CO2 22  --  20* 18*  GLUCOSE 117* 115* 106* 87  BUN '17 18 9 10  ' CALCIUM 9.2  --  8.5* 8.6*  CREATININE 1.28* 1.30* 0.92 1.08  GFRNONAA >60  --  >60 >60    LIVER FUNCTION TESTS: Recent Labs    12/13/20 0151  BILITOT 0.8  AST 22  ALT 16  ALKPHOS 75  PROT 7.2  ALBUMIN 4.0    Assessment and Plan:  History of acute CVA s/p cerebral arteriogram with emergent mechanical thrombectomy of left ICA at bulb  and proximal left MCA M2 middle division occlusions, along with stent placement of left ICA at bulb stenosis, achieving a TICI 3 revascularization via right femoral approach 12/13/2020 by Dr. Karenann Cai. Patient's condition stable- awake and alert, follows simple commands, can spontaneously move all extremities with weakness of left side, speech dysarthric but improving. Right femoral puncture site stable, distal pulses (DPs) 1+ bilaterally. Continue taking Brilinta 90 mg twice daily and Aspirin 81 mg once daily. Plan to follow-up with Dr. Karenann Cai in clinic 1 month after discharge ([facility] schedulers to call patient to set up this appointment).  Further plans per neurology/CCM- appreciate and agree with management. Please call [facility] with questions/concerns.   Electronically Signed: Earley Abide, PA-C 12/15/2020, 10:25 AM   I spent a total of 15 Minutes at the the patient's bedside AND on the patient's hospital floor or unit, greater than 50% of which was counseling/coordinating care for CVA s/p revascularization.

## 2020-12-15 NOTE — Progress Notes (Signed)
NAME:  Dustin Golden, MRN:  [id number], DOB:  Aug 24, 1956, LOS: 2 ADMISSION DATE:  12/13/2020, CONSULTATION DATE:  12/15/20 REFERRING MD:  Leonie Man, CHIEF COMPLAINT:  Stroke   Brief History:  65 year old male with past medical history significant for polysubstance abuse who presented as code stroke with aphasia and found to have occlusion of the left ICA.  Taken for thrombectomy and subsequently agitated and hypertensive.  Cleviprex and Precedex were initiated and PCCM consulted  History of Present Illness:  Dustin Golden is a 65 year old male with past medical history significant for polysubstance abuse.  Per EMS report, family member found him unable to speak.  He was naked on the couch on EMS arrival with dense aphasia.  History of substance abuse including tobacco, alcohol, marijuana and possibly other substances.  He was brought into the ED as a code stroke, CTA with occlusion of the left ICA at the origin and likely distal M3 occlusion.  Urine drug screen ordered but not sent.  He was taken for emergent thrombectomy on 2/28.  He was extubated post procedure and admitted to intensive care.  He developed agitation and was started on Precedex as well as Cleviprex for hypertension, therefore PCCM consulted.  Past Medical History:  Polysubstance abuse, tobacco abuse  Significant Hospital Events:  2/28 admit to neurology, s/p emergent thrombectomy  Consults:  PCCM  Procedures:  ETT-for procedure only 2/28  Significant Diagnostic Tests:   2/28 CT head>> no acute findings  2/28 CTA head and neck>>Occlusion of the left ICA at the origin. Reconstitution in the siphon probably from a combination of external to internal collaterals and patent communicating arteries. I cannot clearly identify the occluded MCA branch vessel, though there must be one, probably in the M3 region. 16 cc completed infarction in the left frontoparietal junction region. Additional 42 cc at risk brain surrounding that region.  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  2/28 TTE >> 1. Left ventricular ejection fraction, by estimation, is 35 to 40%. The left ventricle has moderately decreased function. The left ventricle demonstrates global hypokinesis. There is moderate concentric left ventricular hypertrophy. Left ventricular  diastolic parameters are consistent with Grade I diastolic dysfunction  (impaired relaxation).  2. Right ventricular systolic function is mildly reduced. The right ventricular size is mildly enlarged. There is normal pulmonary artery systolic pressure. The estimated right ventricular systolic pressure is 71.6 mmHg.  3. The mitral valve is normal in structure. Trivial mitral valve regurgitation. No evidence of mitral stenosis. Moderate mitral annular calcification.  4. The aortic valve is normal in structure. Aortic valve regurgitation is not visualized. No aortic stenosis is present.  5. The inferior vena cava is normal in size with greater than 50% respiratory variability, suggesting right atrial pressure of 3 mmHg.   2/28 MRI/ MRA brain >> 1. Acute infarct left posterior MCA distribution involving the posterior insula in the left frontal parietal cortex. Small areas of acute infarct in the frontal lobes bilaterally. 2. Motion degraded study. 3. MRA demonstrates loss of signal in the right posterior cerebral artery. This vessel was patent on CTA earlier today. Possible artifact versus thrombus. 4. There is moderate stenosis in the middle cerebral artery M1 segment bilaterally. Left internal carotid artery appears widely patent.  Micro Data:  2/28 Covid-19, flu>> negative 2/28 MRSA>> negative  Antimicrobials:  n/a  Interim History / Subjective:  Remains off Precedex Awake alert Transition to lower level of care  Objective  Blood pressure 130/75, pulse 74, temperature 98.9 F (37.2 C), temperature source Oral, resp. rate 20,  weight 88 kg, SpO2 100 %.        Intake/Output Summary (Last 24 hours) at 12/15/2020 0759 Last data filed at 12/15/2020 0700 Gross per 24 hour  Intake 2437.93 ml  Output 2950 ml  Net -512.07 ml   Filed Weights   12/13/20 0600 12/14/20 0500 12/15/20 0500  Weight: 88.3 kg 85.3 kg 88 kg   General: Awake alert sitting up eating HEENT: No JVD or lymphadenopathy is appreciated Neuro: Somewhat slow, follows commands moves all extremities questionable trace right-sided weakness CV: Heart sounds are regular regular rate and rhythm PULM: Diminished in the bases room air sats 97% GI: soft, bsx4 active  Extremities: warm/dry, negative edema  Skin: no rashes or lesions   Resolved Hospital Problem list     Assessment & Plan:   Acute embolic left ICA CVA  Hypertension S/p successful mechanical thrombectomy 2/28 with complete left MCA recanalization with stent placement of left ICA P: Per neurology Consider transition out of ICU Pulmonary critical care will be available as needed  Polysubstance abuse Likely EtOH and marijuana, unclear how much alcohol he typically ingests or timing of last drink P: Off Precedex Continue to monitor Wean nicotine patch to off Continue to monitor for any signs of withdrawal  AKI Lab Results  Component Value Date   CREATININE 1.08 12/15/2020   CREATININE 0.92 12/14/2020   CREATININE 1.30 (H) 12/13/2020    P: Creatinine is returned to 1.08 on 12/15/2020 Avoid nephrotoxic Taking p.o. fluids therefore KVO fluids Continue to monitor  Best practice (evaluated daily)  Diet: Dysphagia 3 Pain/Anxiety/Delirium protocol (if indicated): Off CIWA protocol VAP protocol (if indicated): N/A DVT prophylaxis: SCDs GI prophylaxis: N/A Glucose control: CBG before meals and at bedtime, add SSI if > 180 Mobility: PT/ OT Disposition: 12/15/2020 off all drips, sitting up eating ready for transition to a lower level of care.  Goals of Care:  Last date of  multidisciplinary goals of care discussion: Per primary Family and staff present:  Summary of discussion:  Follow up goals of care discussion due:  Code Status: Full code  Labs   CBC: Recent Labs  Lab 12/13/20 0151 12/13/20 0200 12/14/20 0412 12/15/20 0235  WBC 8.1  --  9.4 9.7  NEUTROABS 4.4  --   --   --   HGB 14.4 15.0 12.6* 12.2*  HCT 43.3 44.0 36.4* 37.0*  MCV 78.9*  --  78.1* 79.6*  PLT 213  --  201 347    Basic Metabolic Panel: Recent Labs  Lab 12/13/20 0151 12/13/20 0200 12/14/20 0412 12/14/20 1031 12/15/20 0235  NA 139 142 138  --  137  K 4.0 4.0 3.5  --  3.7  CL 107 106 108  --  106  CO2 22  --  20*  --  18*  GLUCOSE 117* 115* 106*  --  87  BUN 17 18 9   --  10  CREATININE 1.28* 1.30* 0.92  --  1.08  CALCIUM 9.2  --  8.5*  --  8.6*  MG  --   --   --  2.1 2.0  PHOS  --   --   --  3.4 4.2   GFR: CrCl cannot be calculated (Unknown ideal weight.). Recent Labs  Lab 12/13/20 0151 12/14/20 0412 12/15/20 0235  WBC 8.1 9.4 9.7    Liver Function Tests: Recent Labs  Lab 12/13/20 0151  AST 22  ALT 16  ALKPHOS 75  BILITOT 0.8  PROT 7.2  ALBUMIN 4.0   No results for input(s): LIPASE, AMYLASE in the last 168 hours. Recent Labs  Lab 12/13/20 0544  AMMONIA 21    ABG    Component Value Date/Time   TCO2 22 12/13/2020 0200     Coagulation Profile: Recent Labs  Lab 12/13/20 0151  INR 0.9    Cardiac Enzymes: No results for input(s): CKTOTAL, CKMB, CKMBINDEX, TROPONINI in the last 168 hours.  HbA1C: Hgb A1c MFr Bld  Date/Time Value Ref Range Status  12/13/2020 05:41 AM 6.8 (H) 4.8 - 5.6 % Final    Comment:    (NOTE) Pre diabetes:          5.7%-6.4%  Diabetes:              >6.4%  Glycemic control for   <7.0% adults with diabetes     CBG: Recent Labs  Lab 12/14/20 0820 12/14/20 1204 12/14/20 1653 12/14/20 1958 12/15/20 0011  GLUCAP 98 108* 87 103* 89       CRITICAL CARE App cct 30 min    Steve Ankit Degregorio ACNP Acute Care  Nurse Practitioner [place] Please consult Amion 12/15/2020, 7:59 AM

## 2020-12-15 NOTE — Progress Notes (Signed)
Physical Therapy Treatment Patient Details Name: Dustin Golden MRN: [id number] DOB: 12-09-1955 Today's Date: 12/15/2020    History of Present Illness 65 year old male with past medical history significant for polysubstance abuse who presented as code stroke with aphasia and found to have occlusion of the left ICA.  Taken for thrombectomy left ICA at bulb and proximal left MCA M2 middle division occlusions, along with stent placement of left ICA at bulb stenosis, achieving a TICI 3 revascularization via right femoral approach 12/13/2020. MRI + Acute infarct left posterior MCA distribution involving the posterior insula in the left frontal parietal cortex. Small areas of acute infarct in the frontal lobes bilaterally.    PT Comments    Patient already demonstrating improvements in RLE strength and coordination. Able to do standing pre-gait activities with one person/min assist. Limited by ICU monitors and lines to progress to ambulation with one person assist.     Follow Up Recommendations  CIR     Equipment Recommendations  Rolling walker with 5" wheels    Recommendations for Other Services Rehab consult     Precautions / Restrictions Precautions Precautions: Fall    Mobility  Bed Mobility Overal bed mobility: Needs Assistance Bed Mobility: Rolling;Sidelying to Sit;Sit to Supine Rolling: Supervision Sidelying to sit: Mod assist (up from rt side)       General bed mobility comments: attempting to use RUE to raise torso with mod assist to complete    Transfers Overall transfer level: Needs assistance Equipment used: 1 person hand held assist Transfers: Sit to/from Stand Sit to Stand: Min assist         General transfer comment: pt with lean to right and requires cues to achieve midline  Ambulation/Gait Ambulation/Gait assistance: Min assist     Gait Pattern/deviations: Step-to pattern     General Gait Details: pre-gait activities at bedside (fwd/backward stepping  with each foot: sidestepping to his left x 4 ft); currently recommend +2 for lines and safety to progress to gait   Stairs             Wheelchair Mobility    Modified Rankin (Stroke Patients Only) Modified Rankin (Stroke Patients Only) Pre-Morbid Rankin Score: No symptoms Modified Rankin: Moderately severe disability     Balance Overall balance assessment: Needs assistance Sitting-balance support: No upper extremity supported;Feet supported Sitting balance-Leahy Scale: Fair     Standing balance support: During functional activity;No upper extremity supported Standing balance-Leahy Scale: Poor                              Cognition Arousal/Alertness: Awake/alert Behavior During Therapy: WFL for tasks assessed/performed Overall Cognitive Status: Impaired/Different from baseline Area of Impairment: Memory;Following commands;Safety/judgement;Awareness;Problem solving                   Current Attention Level: Selective Memory: Decreased short-term memory Following Commands: Follows one step commands consistently Safety/Judgement: Decreased awareness of deficits Awareness: Emergent Problem Solving: Slow processing;Difficulty sequencing;Requires verbal cues;Requires tactile cues General Comments: slow processing along with requiring incr time to activate rt hemibody      Exercises Other Exercises Other Exercises: supine and seated coordination exercises with RLE (variations of heel to shin) with pt demonstrating progress during session    General Comments General comments (skin integrity, edema, etc.): family present and very attentive      Pertinent Vitals/Pain Pain Assessment: No/denies pain    Home Living  Prior Function            PT Goals (current goals can now be found in the care plan section) Acute Rehab PT Goals Patient Stated Goal: return to baseline PT Goal Formulation: With patient Time For Goal  Achievement: 12/28/20 Potential to Achieve Goals: Good Progress towards PT goals: Progressing toward goals    Frequency    Min 4X/week      PT Plan Current plan remains appropriate    Co-evaluation              AM-PAC PT "6 Clicks" Mobility   Outcome Measure  Help needed turning from your back to your side while in a flat bed without using bedrails?: A Little Help needed moving from lying on your back to sitting on the side of a flat bed without using bedrails?: A Little Help needed moving to and from a bed to a chair (including a wheelchair)?: A Little Help needed standing up from a chair using your arms (e.g., wheelchair or bedside chair)?: A Little Help needed to walk in hospital room?: Total Help needed climbing 3-5 steps with a railing? : Total 6 Click Score: 14    End of Session Equipment Utilized During Treatment: Gait belt Activity Tolerance: Patient tolerated treatment well Patient left: with call bell/phone within reach;with family/visitor present;in chair (family in room and RN OK with pt up and no chair alarm) Nurse Communication: Mobility status PT Visit Diagnosis: Hemiplegia and hemiparesis Hemiplegia - Right/Left: Right Hemiplegia - dominant/non-dominant: Dominant Hemiplegia - caused by: Cerebral infarction     Time: 4967-5916 PT Time Calculation (min) (ACUTE ONLY): 30 min  Charges:  $Neuromuscular Re-education: 23-37 mins                      Arby Barrette, PT Pager [phone]    Rexanne Mano 12/15/2020, 11:52 AM

## 2020-12-15 NOTE — Consult Note (Signed)
Physical Medicine and Rehabilitation Consult Reason for Consult: Right side weakness and aphasia Referring Physician: Dr. Leonie Man   HPI: Dustin Golden is a 65 y.o. right-handed male with history of tobacco and alcohol use as well as marijuana on no scheduled prescription medications.  Per chart review patient lives alone independent prior to admission.  1 level home.  Presented 12/13/2020 with acute onset of right-sided weakness and aphasia.  Admission chemistries unremarkable except glucose 117, creatinine 1.28, alcohol negative, urine drug screen positive cocaine as well as marijuana.  Cranial CT scan negative.  Patient did not receive TPA.  CT angiogram of head and neck showed occlusion of the left ICA at the origin.  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  30 to 50% stenosis of both vertebral artery origins.  Patient underwent emergent mechanical thrombectomy per interventional radiology.  MRI/MRA showed acute infarct left posterior MCA distribution involving the posterior insula and the left frontal parietal cortex.  Small areas of acute infarct in the frontal lobes bilaterally.  MRA demonstrated loss of signal in the right posterior cerebral artery.  Echocardiogram with ejection fraction of 35 to 89% grade 1 diastolic dysfunction.  Neurology follow-up patient currently maintained on aspirin as well as Brilinta for CVA prophylaxis.  Maintained on a mechanical soft thin liquid diet.  Therapy evaluations completed due to patient's right side weakness and aphasia recommendations for physical medicine rehab consult. He has no complaints currently. Wife and daughter are in agreement with CIR.    Review of Systems  Constitutional: Negative for chills and fever.  HENT: Negative for hearing loss.   Eyes: Negative for blurred vision and double vision.  Respiratory: Negative for cough and shortness of breath.   Cardiovascular: Negative for chest  pain and palpitations.  Gastrointestinal: Positive for constipation. Negative for heartburn, nausea and vomiting.  Genitourinary: Negative for dysuria, flank pain and hematuria.  Musculoskeletal: Positive for joint pain and myalgias.  Skin: Negative for rash.  Neurological: Positive for speech change, weakness and headaches.  All other systems reviewed and are negative.  No past medical history on file. Past Surgical History:  Procedure Laterality Date  . APPENDECTOMY    . gastric ulcer surgery    . IR CT HEAD LTD  12/13/2020  . IR INTRAVSC STENT CERV CAROTID W/EMB-PROT MOD SED INCL ANGIO  12/14/2020  . IR PERCUTANEOUS ART THROMBECTOMY/INFUSION INTRACRANIAL INC DIAG ANGIO  12/13/2020  . IR US GUIDE VASC ACCESS RIGHT  12/13/2020  . RADIOLOGY WITH ANESTHESIA N/A 12/13/2020   Procedure: IR WITH ANESTHESIA;  Surgeon: Luanne Bras, MD;  Location: [facility];  Service: Radiology;  Laterality: N/A;   No family history on file. Social History:  reports that he has been smoking. He does not have any smokeless tobacco history on file. He reports current alcohol use. He reports current drug use. Drug: Marijuana. Allergies:  Allergies  Allergen Reactions  . Ibuprofen Other (See Comments)    Nosebleeds   . Sulfa Antibiotics Rash and Other (See Comments)    "Blisters to skin with cream"  . Sulfamethoxazole Rash and Other (See Comments)    "Blisters to skin with cream"   Medications Prior to Admission  Medication Sig Dispense Refill  . BAYER ASPIRIN 325 MG EC tablet Take 325 mg by mouth as needed for pain.      Home: Home Living Family/patient expects to be discharged to:: Private residence Living Arrangements: Alone Available  Help at Discharge: Family,Available 24 hours/day Type of Home: House Home Access: Stairs to enter [employer] of Steps: 1 Home Layout: One level,Other (Comment) (basement) Bathroom Shower/Tub: Tub/shower unit,Curtain Biochemist, clinical: Standard Bathroom  Accessibility: Yes Home Equipment: Cane - single point,Crutches (walking stick)  Lives With: Alone  Functional History: Prior Function Level of Independence: Independent Comments: Truck driver - Box truck Functional Status:  Mobility: Bed Mobility Overal bed mobility: Needs Assistance Bed Mobility: Rolling,Sidelying to Sit,Sit to Supine Rolling: Min assist Sidelying to sit: Min assist Sit to supine: Min assist General bed mobility comments: assist to RUE to protect from injury as decreased awareness of location Transfers Overall transfer level: Needs assistance Equipment used: 2 person hand held assist Transfers: Sit to/from Stand Sit to Stand: Mod assist,+2 physical assistance General transfer comment: pt with lean to right and requires support to achieve and maintain midline Ambulation/Gait Ambulation/Gait assistance: Mod assist,+2 physical assistance Gait Distance (Feet): 1 Feet Assistive device: 2 person hand held assist Gait Pattern/deviations: Step-to pattern General Gait Details: side-step to his left with small steps and support for wt-shifting and max cues for RLE placement    ADL: ADL Overall ADL's : Needs assistance/impaired Eating/Feeding: NPO Grooming: Moderate assistance Upper Body Bathing: Moderate assistance,Sitting Lower Body Bathing: Moderate assistance,Sit to/from stand Upper Body Dressing : Moderate assistance,Sitting Lower Body Dressing: Maximal assistance Toilet Transfer: Moderate assistance,+2 for physical assistance Toileting - Clothing Manipulation Details (indicate cue type and reason): Max A; foley Functional mobility during ADLs: Moderate assistance,+2 for physical assistance  Cognition: Cognition Overall Cognitive Status: Impaired/Different from baseline Arousal/Alertness: Awake/alert Orientation Level: Oriented X4 Attention: Alternating Alternating Attention: Impaired Memory: Impaired Memory Impairment: Decreased recall of new  information,Decreased short term memory Decreased Short Term Memory: Verbal complex,Functional complex Problem Solving: Impaired Executive Function: Sequencing,Organizing Sequencing: Impaired Sequencing Impairment: Verbal complex Organizing: Impaired Organizing Impairment: Verbal complex,Functional complex Safety/Judgment: Impaired Cognition Arousal/Alertness: Awake/alert Behavior During Therapy: WFL for tasks assessed/performed Overall Cognitive Status: Impaired/Different from baseline Area of Impairment: Attention,Memory,Following commands,Safety/judgement,Awareness,Problem solving Current Attention Level: Selective Memory: Decreased short-term memory Following Commands: Follows one step commands consistently Safety/Judgement: Decreased awareness of safety,Decreased awareness of deficits Awareness: Emergent Problem Solving: Slow processing,Difficulty sequencing  Blood pressure (!) 178/78, pulse 93, temperature 98.9 F (37.2 C), temperature source Oral, resp. rate (!) 23, weight 85.3 kg, SpO2 100 %. Physical Exam Gen: no distress, normal appearing HEENT: oral mucosa pink and moist, NCAT Cardio: Reg rate Chest: increased work of breathing.  Abd: soft, non-distended Ext: no edema Psych: pleasant, normal affect Skin: intact Neuro: Musculoskeletal: Neurological:     Comments: Patient is alert in no acute distress.  Makes eye contact with examiner.  Expressive aphasia.  Follows some simple commands. Alert and oriented x3. 5/5 strength on left and 4/5 on right   Results for orders placed or performed during the hospital encounter of 12/13/20 (from the past 24 hour(s))  Glucose, capillary     Status: None   Collection Time: 12/14/20  8:20 AM  Result Value Ref Range   Glucose-Capillary 98 70 - 99 mg/dL  Magnesium     Status: None   Collection Time: 12/14/20 10:31 AM  Result Value Ref Range   Magnesium 2.1 1.7 - 2.4 mg/dL  Phosphorus     Status: None   Collection Time:  12/14/20 10:31 AM  Result Value Ref Range   Phosphorus 3.4 2.5 - 4.6 mg/dL  Glucose, capillary     Status: Abnormal   Collection Time: 12/14/20 12:04 PM  Result Value  Ref Range   Glucose-Capillary 108 (H) 70 - 99 mg/dL  Glucose, capillary     Status: None   Collection Time: 12/14/20  4:53 PM  Result Value Ref Range   Glucose-Capillary 87 70 - 99 mg/dL  Glucose, capillary     Status: Abnormal   Collection Time: 12/14/20  7:58 PM  Result Value Ref Range   Glucose-Capillary 103 (H) 70 - 99 mg/dL  Glucose, capillary     Status: None   Collection Time: 12/15/20 12:11 AM  Result Value Ref Range   Glucose-Capillary 89 70 - 99 mg/dL  CBC     Status: Abnormal   Collection Time: 12/15/20  2:35 AM  Result Value Ref Range   WBC 9.7 4.0 - 10.5 K/uL   RBC 4.65 4.22 - 5.81 MIL/uL   Hemoglobin 12.2 (L) 13.0 - 17.0 g/dL   HCT 37.0 (L) 39.0 - 52.0 %   MCV 79.6 (L) 80.0 - 100.0 fL   MCH 26.2 26.0 - 34.0 pg   MCHC 33.0 30.0 - 36.0 g/dL   RDW 15.1 11.5 - 15.5 %   Platelets 197 150 - 400 K/uL   nRBC 0.0 0.0 - 0.2 %  Basic metabolic panel     Status: Abnormal   Collection Time: 12/15/20  2:35 AM  Result Value Ref Range   Sodium 137 135 - 145 mmol/L   Potassium 3.7 3.5 - 5.1 mmol/L   Chloride 106 98 - 111 mmol/L   CO2 18 (L) 22 - 32 mmol/L   Glucose, Bld 87 70 - 99 mg/dL   BUN 10 8 - 23 mg/dL   Creatinine, Ser 1.08 0.61 - 1.24 mg/dL   Calcium 8.6 (L) 8.9 - 10.3 mg/dL   GFR, Estimated >60 >60 mL/min   Anion gap 13 5 - 15  Phosphorus     Status: None   Collection Time: 12/15/20  2:35 AM  Result Value Ref Range   Phosphorus 4.2 2.5 - 4.6 mg/dL  Magnesium     Status: None   Collection Time: 12/15/20  2:35 AM  Result Value Ref Range   Magnesium 2.0 1.7 - 2.4 mg/dL   MR ANGIO HEAD WO CONTRAST  Result Date: 12/13/2020 CLINICAL DATA:  Stroke. Post left internal carotid artery thrombectomy and stenting of left carotid bifurcation. EXAM: MRI HEAD WITHOUT CONTRAST MRA HEAD WITHOUT CONTRAST  TECHNIQUE: Multiplanar, multiecho pulse sequences of the brain and surrounding structures were obtained without intravenous contrast. Angiographic images of the head were obtained using MRA technique without contrast. COMPARISON:  CT angio head and neck and CT perfusion 12/13/2020 FINDINGS: MRI HEAD FINDINGS Brain: Acute infarct in the left parietal lobe over the convexity. This extends anteriorly into the operculum and posterior insula. Small areas of acute infarct in the left frontal white matter and cortex and in the right frontal white matter. No associated hemorrhage. Ventricle size normal.  No mass or midline shift. Vascular: Normal arterial flow voids. Skull and upper cervical spine: No focal lesion. Sinuses/Orbits: Paranasal sinuses clear.  Negative orbit Other: Motion degraded study. MRA HEAD FINDINGS Both vertebral arteries patent to the basilar. PICA patent bilaterally. Basilar widely patent. Superior cerebellar arteries patent bilaterally. Left posterior cerebral artery widely patent. Patent left posterior communicating artery. Fetal origin of the right posterior cerebral artery. Decreased signal in the right P1 and P2 segment. This vessel appeared patent on CTA earlier today. Question artifact versus thrombus. Internal carotid artery patent bilaterally without stenosis. Focal moderate stenosis left M1 segment. There  is good distal flow and right middle cerebral artery branches are patent. Anterior cerebral arteries patent bilaterally. Moderate stenosis right middle cerebral artery. IMPRESSION: 1. Acute infarct left posterior MCA distribution involving the posterior insula in the left frontal parietal cortex. Small areas of acute infarct in the frontal lobes bilaterally. 2. Motion degraded study. 3. MRA demonstrates loss of signal in the right posterior cerebral artery. This vessel was patent on CTA earlier today. Possible artifact versus thrombus. 4. There is moderate stenosis in the middle cerebral  artery M1 segment bilaterally. Left internal carotid artery appears widely patent. Electronically Signed   By: Franchot Gallo M.D.   On: 12/13/2020 19:17   MR BRAIN WO CONTRAST  Result Date: 12/13/2020 CLINICAL DATA:  Stroke. Post left internal carotid artery thrombectomy and stenting of left carotid bifurcation. EXAM: MRI HEAD WITHOUT CONTRAST MRA HEAD WITHOUT CONTRAST TECHNIQUE: Multiplanar, multiecho pulse sequences of the brain and surrounding structures were obtained without intravenous contrast. Angiographic images of the head were obtained using MRA technique without contrast. COMPARISON:  CT angio head and neck and CT perfusion 12/13/2020 FINDINGS: MRI HEAD FINDINGS Brain: Acute infarct in the left parietal lobe over the convexity. This extends anteriorly into the operculum and posterior insula. Small areas of acute infarct in the left frontal white matter and cortex and in the right frontal white matter. No associated hemorrhage. Ventricle size normal.  No mass or midline shift. Vascular: Normal arterial flow voids. Skull and upper cervical spine: No focal lesion. Sinuses/Orbits: Paranasal sinuses clear.  Negative orbit Other: Motion degraded study. MRA HEAD FINDINGS Both vertebral arteries patent to the basilar. PICA patent bilaterally. Basilar widely patent. Superior cerebellar arteries patent bilaterally. Left posterior cerebral artery widely patent. Patent left posterior communicating artery. Fetal origin of the right posterior cerebral artery. Decreased signal in the right P1 and P2 segment. This vessel appeared patent on CTA earlier today. Question artifact versus thrombus. Internal carotid artery patent bilaterally without stenosis. Focal moderate stenosis left M1 segment. There is good distal flow and right middle cerebral artery branches are patent. Anterior cerebral arteries patent bilaterally. Moderate stenosis right middle cerebral artery. IMPRESSION: 1. Acute infarct left posterior MCA  distribution involving the posterior insula in the left frontal parietal cortex. Small areas of acute infarct in the frontal lobes bilaterally. 2. Motion degraded study. 3. MRA demonstrates loss of signal in the right posterior cerebral artery. This vessel was patent on CTA earlier today. Possible artifact versus thrombus. 4. There is moderate stenosis in the middle cerebral artery M1 segment bilaterally. Left internal carotid artery appears widely patent. Electronically Signed   By: Franchot Gallo M.D.   On: 12/13/2020 19:17   IR INTRAVSC STENT CERV CAROTID W/EMB-PROT MOD SED  Result Date: 12/14/2020 INDICATION: 65 year old male with past medical history significant for tobacco use, alcohol and possible drug abuse. Patient presented severe aphasia and right-sided weakness. Initial NIHSS 22 later improving to 10. Head CT showed hypodensity in the left posterior frontal region (aspects 9) CT/CT angiogram of the head and neck showed a left ICA occlusion at the bulb with intracranial reconstitution in a left M3/MCA branch occlusion. Additionally patient head high-grade stenosis of the cervical right ICA and mild stenosis at the origin of the bilateral vertebral arteries. CT perfusion showed a core infarct of 16 mL with a 58 mL penumbra. He was then transferred to our service for a diagnostic cerebral angiogram with mechanical thrombectomy. EXAM: Ultrasound-guided vascular access Diagnostic cerebral angiogram Mechanical thrombectomy Flat panel head CT  Left carotid stenting with cerebral protection device COMPARISON:  CT/CT angiogram of December 13, 2020 MEDICATIONS: IV cangrelor utilized prior to stent deployment ANESTHESIA/SEDATION: The procedure was performed in the general anesthesia. CONTRAST:  95 mL of Omnipaque 240 milligram/mL FLUOROSCOPY TIME:  Fluoroscopy Time: 38 minutes 54 seconds (970 mGy). COMPLICATIONS: None immediate. TECHNIQUE: Informed written consent was not obtained given patient's severe aphasia  and no reachable healthcare proxy or next of kin available. Maximal Sterile Barrier Technique was utilized including caps, mask, sterile gowns, sterile gloves, sterile drape, hand hygiene and skin antiseptic. A timeout was performed prior to the initiation of the procedure. The right groin was prepped and draped in the usual sterile fashion. Using a micropuncture kit and the modified Seldinger technique, access was gained to the right common femoral artery and an 8 French sheath was placed. Real-time ultrasound guidance was utilized for vascular access including the acquisition of a permanent ultrasound image documenting patency of the accessed vessel. Under fluoroscopy, an 8 [place] Walrus balloon guide catheter was navigated over a 6 [place] Berenstein 2 catheter and a 0.035" Terumo Glidewire into the aortic arch. The catheter was placed into the left common carotid artery. Frontal and lateral angiograms of the neck were obtained. Under biplane roadmap, a zoom 71 aspiration catheter was navigated over a phenom 21 microcatheter and a Aristotle 14 microguidewire into the cavernous segment of the left ICA. The balloon guide catheter was advanced into the upper cervical segment of the left ICA. Frontal and lateral angiograms of the head were obtained via aspiration catheter contrast injection. FINDINGS: 1. Occlusion of the left ICA in the neck at the level of the bulb. 2. Occlusion of a proximal left M2/MCA middle division branch. 3. Nonocclusive filling defect within a left A4/ACA branch. 4. Patent right common femoral artery. PROCEDURE: Magnified frontal and lateral angiograms of the head were obtained in used as biplane roadmap. The microcatheter was then navigated over the wire into the left M3/MCA middle division branch. Then, a 4 x 40 mm solitaire stent retriever was deployed spanning the M2 and proximal M3 segment. The device was allowed to intercalated with the clot for 4 minutes. The microcatheter was removed.  The aspiration catheter was advanced to the level of occlusion and connected to a penumbra aspiration pump. The guiding catheter balloon was inflated. The thrombectomy device and aspiration catheter were removed under constant aspiration. Follow-up left ICA angiogram showed complete recanalization the left MCA vascular tree. The guiding catheter was then retracted into the left common carotid artery. Frontal and lateral angiograms of the neck were obtained. Patent left ICA noted at the bulb with significant luminal irregularity and filling defect. Left common carotid artery angiograms with frontal and lateral of the head showed for opacification of the left MCA branches which remain open. Flat panel CT of the head was obtained and post processed in a separate workstation with concurrent attending physician supervision. Selected images were sent to PACS. No evidence of hemorrhagic complication noted. Follow-up left common carotid artery angiogram showed worsening of the left ICA stenosis at the bulb with near occlusion and slow flow distally. Patient was loaded on cangrelor in anticipation to stent the placement. Under biplane roadmap, a 4-7 Emboshield NAV6 cerebral protection device was navigated into the distal cervical segment of the left ICA. Subsequently, a 9-7 x 40 mm XACT carotid stent was deployed across the area of stenosis in the left ICA. Then, in stent stenosis was performed with a 4 x 30 mm Viatrac  balloon. The cerebral protection device was recaptured. Follow-up left ICA angiogram showed good position of the stent with significantly improved anterograde flow. Left ICA angiograms with frontal and lateral views of the head showed no evidence of thromboembolic complication with significant improvement of the anterograde flow. Delayed left ICA angiogram showed no evidence of in stent clot formation. Delayed cranial angiograms appear stable. The system was subsequently withdrawn. Left common femoral artery  angiograms were obtained with frontal and lateral views. The puncture is at the level of the common femoral artery which shows mild atherosclerotic change with adequate caliber for closure device utilization. The femoral sheath thus exchanged for a Perclose ProGlide which was utilized for access closure. Immediate hemostasis was achieved. IMPRESSION: 1. Successful mechanical thrombectomy for treatment of a left M2/MCA occlusion with a single pass of combined aspiration plus stent retriever achieving complete recanalization (TICI3). 2. Stenting and angioplasty of left ICA severe stenosis with cerebral protection device. PLAN: Patient is to continue on cangrelor drip until follow-up imaging. Then, transition to dual anti-platelet therapy with aspirin and Brilinta. Electronically Signed   By: [name] M.D.   On: 12/14/2020 12:36   DG Swallowing Func-Speech Pathology  Result Date: 12/14/2020 Objective Swallowing Evaluation: Type of Study: MBS-Modified Barium Swallow Study  Patient Details Name: Dustin Golden MRN: [id number] Date of Birth: 1955/11/20 Today's Date: 12/14/2020 Time: SLP Start Time (ACUTE ONLY): 1315 -SLP Stop Time (ACUTE ONLY): 1335 SLP Time Calculation (min) (ACUTE ONLY): 20 min Past Medical History: No past medical history on file. Past Surgical History: Past Surgical History: Procedure Laterality Date . APPENDECTOMY   . gastric ulcer surgery   . IR CT HEAD LTD  12/13/2020 . IR INTRAVSC STENT CERV CAROTID W/EMB-PROT MOD SED INCL ANGIO  12/14/2020 . IR PERCUTANEOUS ART THROMBECTOMY/INFUSION INTRACRANIAL INC DIAG ANGIO  12/13/2020 . IR US GUIDE VASC ACCESS RIGHT  12/13/2020 . RADIOLOGY WITH ANESTHESIA N/A 12/13/2020  Procedure: IR WITH ANESTHESIA;  Surgeon: Luanne Bras, MD;  Location: [facility];  Service: Radiology;  Laterality: N/A; HPI: 65 y.o. male with past medical history significant for ongoing tobacco abuse, alcohol use and drug use (marijuana and possibly other substances) per EMS  report per bystander report. 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 Small areas of acute infarct in the frontal lobes bilaterally. Pt s/p successful mechanical thrombectomy of the proximal left M2's/MCA middle division occlusion.  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 Right sided oral motor deficits from CVA allowed for frequent right sided anterior spillage, reduced bolus cohesion, right sided oral residuals, and premature spillage of bolus. Pharyngeally pt with reduced timing and efficiency of laryngeal vestibule closure with an instance of trace transient penetration of thins and one instance of trace silent aspiration of thins by cup on initial series. Subsequent trials of thins via cup and straw were without penetration or aspiration (including mixed consistency with barium tablet series). Mild vallecular and pyriform sinsus residuasl noted with POs, clearing with reflexive swallows. Recommend dysphagia 3 (mechanical soft) and thin liquids with meds in puree. SLP to follow up. SLP Visit Diagnosis Dysphagia, oropharyngeal phase (R13.12) Attention and concentration deficit following -- Frontal lobe and executive function deficit following -- Impact on safety and function Mild aspiration risk;Moderate aspiration risk   CHL IP TREATMENT RECOMMENDATION 12/14/2020 Treatment  Recommendations Therapy as outlined in treatment plan below   Prognosis 12/14/2020 Prognosis for Safe Diet Advancement Good Barriers to Reach Goals Time post onset Barriers/Prognosis Comment -- CHL IP DIET RECOMMENDATION 12/14/2020 SLP Diet Recommendations Dysphagia 3 (Mech soft) solids;Thin liquid Liquid Administration via Cup;Straw Medication Administration Whole meds with puree  Compensations Minimize environmental distractions;Slow rate;Small sips/bites;Lingual sweep for clearance of pocketing;Clear throat intermittently Postural Changes Remain semi-upright after after feeds/meals (Comment);Seated upright at 90 degrees   CHL IP OTHER RECOMMENDATIONS 12/14/2020 Recommended Consults -- Oral Care Recommendations Oral care BID Other Recommendations --   CHL IP FOLLOW UP RECOMMENDATIONS 12/14/2020 Follow up Recommendations Inpatient Rehab   CHL IP FREQUENCY AND DURATION 12/14/2020 Speech Therapy Frequency (ACUTE ONLY) min 2x/week Treatment Duration 2 weeks      CHL IP ORAL PHASE 12/14/2020 Oral Phase Impaired Oral - Pudding Teaspoon -- Oral - Pudding Cup -- Oral - Honey Teaspoon -- Oral - Honey Cup -- Oral - Nectar Teaspoon -- Oral - Nectar Cup Right pocketing in lateral sulci;Lingual/palatal residue;Right anterior bolus loss Oral - Nectar Straw -- Oral - Thin Teaspoon -- Oral - Thin Cup Lingual/palatal residue;Right anterior bolus loss;Right pocketing in lateral sulci Oral - Thin Straw Right anterior bolus loss;Lingual/palatal residue Oral - Puree Lingual/palatal residue;Right pocketing in lateral sulci;Delayed oral transit Oral - Mech Soft Right pocketing in lateral sulci;Lingual/palatal residue;Decreased bolus cohesion;Delayed oral transit Oral - Regular -- Oral - Multi-Consistency -- Oral - Pill Lingual/palatal residue;Right anterior bolus loss;Delayed oral transit Oral Phase - Comment --  CHL IP PHARYNGEAL PHASE 12/14/2020 Pharyngeal Phase Impaired Pharyngeal- Pudding Teaspoon -- Pharyngeal -- Pharyngeal- Pudding Cup -- Pharyngeal -- Pharyngeal- Honey Teaspoon -- Pharyngeal -- Pharyngeal- Honey Cup -- Pharyngeal -- Pharyngeal- Nectar Teaspoon -- Pharyngeal -- Pharyngeal- Nectar Cup Delayed swallow initiation-vallecula;Pharyngeal residue - pyriform;Pharyngeal residue - valleculae;Reduced laryngeal elevation;Reduced tongue base retraction Pharyngeal -- Pharyngeal- Nectar Straw -- Pharyngeal --  Pharyngeal- Thin Teaspoon -- Pharyngeal -- Pharyngeal- Thin Cup Reduced laryngeal elevation;Reduced tongue base retraction;Pharyngeal residue - valleculae;Pharyngeal residue - pyriform;Penetration/Aspiration before swallow;Penetration/Aspiration during swallow;Reduced epiglottic inversion;Reduced airway/laryngeal closure;Delayed swallow initiation-pyriform sinuses Pharyngeal Material enters airway, passes BELOW cords without attempt by patient to eject out (silent aspiration);Material enters airway, remains ABOVE vocal cords and not ejected out;Material does not enter airway Pharyngeal- Thin Straw Pharyngeal residue - valleculae;Pharyngeal residue - pyriform;Delayed swallow initiation-pyriform sinuses;Reduced tongue base retraction;Reduced laryngeal elevation Pharyngeal -- Pharyngeal- Puree Reduced tongue base retraction;Reduced laryngeal elevation;Delayed swallow initiation-vallecula;Pharyngeal residue - valleculae;Pharyngeal residue - pyriform Pharyngeal -- Pharyngeal- Mechanical Soft Delayed swallow initiation-vallecula;Reduced laryngeal elevation;Reduced tongue base retraction;Pharyngeal residue - valleculae;Pharyngeal residue - pyriform Pharyngeal -- Pharyngeal- Regular -- Pharyngeal -- Pharyngeal- Multi-consistency -- Pharyngeal -- Pharyngeal- Pill Pharyngeal residue - pyriform;Pharyngeal residue - valleculae;Reduced tongue base retraction;Reduced laryngeal elevation Pharyngeal -- Pharyngeal Comment --  CHL IP CERVICAL ESOPHAGEAL PHASE 12/14/2020 Cervical Esophageal Phase WFL Pudding Teaspoon -- Pudding Cup -- Honey Teaspoon -- Honey Cup -- Nectar Teaspoon -- Nectar Cup -- Nectar Straw -- Thin Teaspoon -- Thin Cup -- Thin Straw -- Puree -- Mechanical Soft -- Regular -- Multi-consistency -- Pill -- Cervical Esophageal Comment -- Hayden Rasmussen MA, CCC-SLP [facility] 12/14/2020, 2:09 PM              ECHOCARDIOGRAM COMPLETE  Result Date: 12/13/2020    ECHOCARDIOGRAM REPORT   Patient Name:   Dustin Golden Date of Exam: 12/13/2020 Medical Rec #:  [id number]    Height:       76.0 in Accession #:    [id number]  Weight:       194.7 lb Date of Birth:  [birth year]    BSA:          2.190 m Patient Age:    45 years     BP:           113/65 mmHg Patient Gender: M            HR:           68 bpm. Exam Location:  Inpatient Procedure: 2D Echo, Cardiac Doppler, Color Doppler and Intracardiac            Opacification Agent Indications:    Stroke  History:        Patient has no prior history of Echocardiogram examinations.                 Stroke; Risk Factors:Current Smoker. ETOH.  Sonographer:    Roseanna Rainbow RDCS Referring Phys: [id number] [place]  Sonographer Comments: Image acquisition challenging due to respiratory motion. Images extremely respiratory. IMPRESSIONS  1. Left ventricular ejection fraction, by estimation, is 35 to 40%. The left ventricle has moderately decreased function. The left ventricle demonstrates global hypokinesis. There is moderate concentric left ventricular hypertrophy. Left ventricular diastolic parameters are consistent with Grade I diastolic dysfunction (impaired relaxation).  2. Right ventricular systolic function is mildly reduced. The right ventricular size is mildly enlarged. There is normal pulmonary artery systolic pressure. The estimated right ventricular systolic pressure is 27.0 mmHg.  3. The mitral valve is normal in structure. Trivial mitral valve regurgitation. No evidence of mitral stenosis. Moderate mitral annular calcification.  4. The aortic valve is normal in structure. Aortic valve regurgitation is not visualized. No aortic stenosis is present.  5. The inferior vena cava is normal in size with greater than 50% respiratory variability, suggesting right atrial pressure of 3 mmHg. FINDINGS  Left Ventricle: Left ventricular ejection fraction, by estimation, is 35 to 40%. The left ventricle has moderately decreased function. The left ventricle demonstrates global hypokinesis.  Definity contrast agent was given IV to delineate the left ventricular endocardial borders. The left ventricular internal cavity size was normal in size. There is moderate concentric left ventricular hypertrophy. Left ventricular diastolic parameters are consistent with Grade I diastolic dysfunction (impaired relaxation). Normal left ventricular filling pressure. Right Ventricle: The right ventricular size is mildly enlarged. No increase in right ventricular wall thickness. Right ventricular systolic function is mildly reduced. There is normal pulmonary artery systolic pressure. The tricuspid regurgitant velocity  is 2.00 m/s, and with an assumed right atrial pressure of 3 mmHg, the estimated right ventricular systolic pressure is 62.3 mmHg. Left Atrium: Left atrial size was normal in size. Right Atrium: Right atrial size was normal in size. Pericardium: There is no evidence of pericardial effusion. Mitral Valve: The mitral valve is normal in structure. There is mild thickening of the mitral valve leaflet(s). Moderate mitral annular calcification. Trivial mitral valve regurgitation. No evidence of mitral valve stenosis. Tricuspid Valve: The tricuspid valve is normal in structure. Tricuspid valve regurgitation is mild . No evidence of tricuspid stenosis. Aortic Valve: The aortic valve is normal in structure. Aortic valve regurgitation is not visualized. No aortic stenosis is present. Pulmonic Valve: The pulmonic valve was normal in structure. Pulmonic valve regurgitation is not visualized. No evidence of pulmonic stenosis. Aorta: The aortic root is normal in size and structure. Venous: The inferior vena cava is normal in size with greater than 50% respiratory variability, suggesting right atrial pressure of  3 mmHg. IAS/Shunts: No atrial level shunt detected by color flow Doppler.  LEFT VENTRICLE PLAX 2D LVIDd:         5.30 cm      Diastology LVIDs:         4.50 cm      LV e' medial:    5.22 cm/s LV PW:         1.60  cm      LV E/e' medial:  11.4 LV IVS:        1.60 cm      LV e' lateral:   5.92 cm/s LVOT diam:     1.80 cm      LV E/e' lateral: 10.1 LV SV:         42 LV SV Index:   19 LVOT Area:     2.54 cm  LV Volumes (MOD) LV vol d, MOD A2C: 115.0 ml LV vol d, MOD A4C: 142.0 ml LV vol s, MOD A2C: 66.8 ml LV vol s, MOD A4C: 105.0 ml LV SV MOD A2C:     48.2 ml LV SV MOD A4C:     142.0 ml LV SV MOD BP:      44.8 ml RIGHT VENTRICLE            IVC RV S prime:     7.79 cm/s  IVC diam: 2.40 cm TAPSE (M-mode): 1.6 cm LEFT ATRIUM             Index       RIGHT ATRIUM           Index LA diam:        3.10 cm 1.42 cm/m  RA Area:     17.30 cm LA Vol (A2C):   39.6 ml 18.08 ml/m RA Volume:   50.40 ml  23.01 ml/m LA Vol (A4C):   44.2 ml 20.18 ml/m LA Biplane Vol: 44.3 ml 20.23 ml/m  AORTIC VALVE LVOT Vmax:   90.20 cm/s LVOT Vmean:  62.700 cm/s LVOT VTI:    0.167 m  AORTA Ao Root diam: 3.40 cm MITRAL VALVE               TRICUSPID VALVE MV Area (PHT): 3.21 cm    TR Peak grad:   16.0 mmHg MV Decel Time: 236 msec    TR Vmax:        200.00 cm/s MV E velocity: 59.70 cm/s MV A velocity: 82.00 cm/s  SHUNTS MV E/A ratio:  0.73        Systemic VTI:  0.17 m                            Systemic Diam: 1.80 cm Ena Dawley MD Electronically signed by Ena Dawley MD Signature Date/Time: 12/13/2020/1:53:15 PM    Final      Assessment/Plan: Diagnosis: Left ICA and left MCA/M2 stroke  1. Does the need for close, 24 hr/day medical supervision in concert with the patient's rehab needs make it unreasonable for this patient to be served in a less intensive setting? Yes 2. Co-Morbidities requiring supervision/potential complications:  1. History of tobacco abuse 2. Expressive aphasia: he will require intensive SLP 3. Right sided weakness: he will require intensive PT and OT 4. Uncontrolled HTN: continue amlodipine 35m 5. Dysphagia: s/p MBS: D3, thins 3. Due to bladder management, bowel management, safety, skin/wound care, disease  management, medication administration, pain management and patient education, does the patient require  24 hr/day rehab nursing? Yes 4. Does the patient require coordinated care of a physician, rehab nurse, therapy disciplines of PT, OT, SLP to address physical and functional deficits in the context of the above medical diagnosis(es)? Yes Addressing deficits in the following areas: balance, endurance, locomotion, strength, transferring, bowel/bladder control, bathing, dressing, feeding, grooming, toileting, cognition, speech, language, swallowing and psychosocial support 5. Can the patient actively participate in an intensive therapy program of at least 3 hrs of therapy per day at least 5 days per week? Yes 6. The potential for patient to make measurable gains while on inpatient rehab is excellent 7. Anticipated functional outcomes upon discharge from inpatient rehab are modified independent  with PT, modified independent with OT, modified independent with SLP. 8. Estimated rehab length of stay to reach the above functional goals is: 2-3 weeks 9. Anticipated discharge destination: Home 10. Overall Rehab/Functional Prognosis: excellent  RECOMMENDATIONS: This patient's condition is appropriate for continued rehabilitative care in the following setting: CIR Patient has agreed to participate in recommended program. Yes Note that insurance prior authorization may be required for reimbursement for recommended care.  Comment: Thank you for this consult. Admission coordinator to follow.   I have personally performed a face to face diagnostic evaluation, including, but not limited to relevant history and physical exam findings, of this patient and developed relevant assessment and plan.  Additionally, I have reviewed and concur with the physician assistant's documentation above.  Leeroy Cha, MD  Lavon Paganini [place], PA-C 12/15/2020

## 2020-12-15 NOTE — Progress Notes (Signed)
Stroke Team Addendum  Called by patient's RN notified me that patient had a fall while getting out of bed.  He has sustained a small abrasion on the right knee and has a large right frontal scalp hematoma.  Patient is awake alert interactive has mild residual aphasia which is unchanged from his baseline from this morning.  Right knee is not swollen or tender and has full range of motion. Recommend bedrest today.  Cold compression of the right scalp hematoma.  Check CT scan of the head without contrast stat. Patient counseled to be compliant and not to get out of bed as a condom catheter at night.  Patient will be transferred out of the unit to neurology floor bed after CT scan changes Antony Contras, MD

## 2020-12-15 NOTE — Plan of Care (Signed)
Patient has progressed during the night. Weaker on right side but moves with no drift. Speech is still dysarthric and word hunting. VS stable with adequate urine output. Will continue to monitor.

## 2020-12-16 ENCOUNTER — Other Ambulatory Visit: Payer: Self-pay

## 2020-12-16 ENCOUNTER — Inpatient Hospital Stay (HOSPITAL_COMMUNITY): Payer: Commercial Managed Care - PPO

## 2020-12-16 ENCOUNTER — Encounter (HOSPITAL_COMMUNITY): Payer: Self-pay | Admitting: Neurology

## 2020-12-16 LAB — BASIC METABOLIC PANEL
Anion gap: 12 (ref 5–15)
BUN: 8 mg/dL (ref 8–23)
CO2: 19 mmol/L — ABNORMAL LOW (ref 22–32)
Calcium: 8.9 mg/dL (ref 8.9–10.3)
Chloride: 105 mmol/L (ref 98–111)
Creatinine, Ser: 1.04 mg/dL (ref 0.61–1.24)
GFR, Estimated: >60 mL/min (ref >60)
Glucose, Bld: 108 mg/dL — ABNORMAL HIGH (ref 70–99)
Potassium: 3.9 mmol/L (ref 3.5–5.1)
Sodium: 136 mmol/L (ref 135–145)

## 2020-12-16 LAB — GLUCOSE, CAPILLARY
Glucose-Capillary: 100 mg/dL — ABNORMAL HIGH (ref 70–99)
Glucose-Capillary: 103 mg/dL — ABNORMAL HIGH (ref 70–99)
Glucose-Capillary: 115 mg/dL — ABNORMAL HIGH (ref 70–99)
Glucose-Capillary: 119 mg/dL — ABNORMAL HIGH (ref 70–99)
Glucose-Capillary: 134 mg/dL — ABNORMAL HIGH (ref 70–99)
Glucose-Capillary: 150 mg/dL — ABNORMAL HIGH (ref 70–99)
Glucose-Capillary: 152 mg/dL — ABNORMAL HIGH (ref 70–99)

## 2020-12-16 LAB — MAGNESIUM: Magnesium: 2.1 mg/dL (ref 1.7–2.4)

## 2020-12-16 LAB — PHOSPHORUS: Phosphorus: 4.5 mg/dL (ref 2.5–4.6)

## 2020-12-16 NOTE — PMR Pre-admission (Signed)
PMR Admission Coordinator Pre-Admission Assessment  Patient: Dustin Golden is an 65 y.o., male MRN: [id number] DOB: Aug 28, 1956 Height: 6\' 4"  (193 cm) Weight: 86.5 kg              Insurance Information HMO:     PPO: yes     PCP:      IPA:      80/20:      OTHER:  PRIMARY: [place]      Policy#: [id number]      Subscriber: pt CM Name: Lattie Haw      Phone#: [phone]     Fax#: [phone] Pre-Cert#: [id number]  Received approval 12/16/20 for 7 days beginning 12/17/20-12/24/20.    Employer:  Benefits:  Phone #: [phone]     Name: 3/2 Eff. Date: 04/15/2020     Deduct: $3000      Out of Pocket Max: [phone]      Life Max: none  CIR: 70%      SNF: 100% 120 days per year Outpatient: 70%     Co-Pay: 20 combined per year Home Health: 70%      Co-Pay: 30% DME: 70%     Co-Pay: 30% Providers: in network  SECONDARY: none       Financial Counselor:       Phone#:   [employer] Engineer, [employer]" for patients in Inpatient Rehabilitation Facilities with attached "Privacy Act [place] Records" was provided and verbally reviewed with: N/A  Emergency Contact Information Contact Information    Name Relation Home Work Mobile   Korion, Cuevas [place] Daughter   [phone]   Amear, Strojny Sister [phone]     Amontae, Ng Mother [phone]     [name] Friend   [phone]     Current Medical History  Patient Admitting Diagnosis: CVA  History of Present Illness:  65 year old right-handed male with history of tobacco and alcohol use and marijuana on no prescription medications.  Presented 12/13/2020 with acute onset of right-sided weakness and aphasia.  Admission chemistries unremarkable except glucose 117 creatinine 1.28 alcohol negative, urine drug screen positive cocaine as well as marijuana.  Cranial CT scan negative.  Patient did not receive TPA.  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  30 to 50% stenosis of both vertebral artery origins.  Patient underwent emergent mechanical thrombectomy per interventional radiology.  MRI/MRA showed acute infarction left posterior MCA distribution involving the posterior insula and the left frontal parietal cortex.  Small areas of acute infarction in the frontal lobes bilaterally.  MRA demonstrated loss of signal in the right posterior cerebral artery.  Echocardiogram with ejection fraction of 35 to 92% grade 1 diastolic dysfunction.  Neurology follow-up currently maintained on aspirin as well as Brilinta for CVA prophylaxis.  Maintain on a mechanical soft thin liquid diet.  Patient did have a reported fall 12/15/2020 sustaining small abrasion on the right knee and large right facial scalp hematoma patient remained awake alert and interactive unchanged from baseline with follow-up per neurology services and received follow-up cranial CT scan after a fall re demonstrating moderate to large acute early subacute cortical and subcortical left MCA infarct no acute changes..     Complete NIHSS TOTAL: 5 Glasgow Coma Scale Score: 15  Past Medical History  Past Medical History:  Diagnosis Date  . Hypertension     Family  History  family history is not on file.  Prior Rehab/Hospitalizations:  Has the patient had prior rehab or hospitalizations prior to admission? Yes  Has the patient had major surgery during 100 days prior to admission? Yes  Current Medications   Current Facility-Administered Medications:  .   stroke: mapping our early stages of recovery book, , Does not apply, Once, Bhagat, Srishti L, MD .  acetaminophen (TYLENOL) tablet 650 mg, 650 mg, Oral, Q4H PRN, 650 mg at 12/16/20 2142 **OR** acetaminophen (TYLENOL) 160 MG/5ML solution 650 mg, 650 mg, Per Tube, Q4H PRN **OR** acetaminophen (TYLENOL) suppository 650 mg, 650 mg, Rectal, Q4H PRN, Bhagat, Srishti L, MD .   amLODipine (NORVASC) tablet 10 mg, 10 mg, Oral, Daily, Jennelle Human B, NP, 10 mg at 12/17/20 1029 .  aspirin chewable tablet 81 mg, 81 mg, Oral, Daily, Garvin Fila, MD, 81 mg at 12/17/20 1029 .  Chlorhexidine Gluconate Cloth 2 % PADS 6 each, 6 each, Topical, Daily, Garvin Fila, MD, 6 each at 12/17/20 1029 .  docusate sodium (COLACE) capsule 100 mg, 100 mg, Oral, BID PRN, Bhagat, Srishti L, MD .  [DISCONTINUED] folic acid injection 1 mg, 1 mg, Intravenous, Daily, 1 mg at 56/43/32 9518 **OR** folic acid (FOLVITE) tablet 1 mg, 1 mg, Oral, Daily, Gleason, Otilio Carpen, PA-C, 1 mg at 12/17/20 1029 .  hydrALAZINE (APRESOLINE) injection 20 mg, 20 mg, Intravenous, Q6H PRN, Olivencia-Simmons, Ivelisse, NP, 20 mg at 12/14/20 1505 .  insulin aspart (novoLOG) injection 0-9 Units, 0-9 Units, Subcutaneous, Q4H, Bhagat, Srishti L, MD, 1 Units at 12/16/20 0524 .  multivitamin with minerals tablet 1 tablet, 1 tablet, Oral, Daily, Gleason, Otilio Carpen, PA-C, 1 tablet at 12/17/20 1029 .  nicotine (NICODERM CQ - dosed in mg/24 hr) patch 7 mg, 7 mg, Transdermal, Daily, Minor, Grace Bushy, NP, 7 mg at 12/17/20 1028 .  pantoprazole (PROTONIX) EC tablet 40 mg, 40 mg, Oral, QHS, Priscella Mann, RPH, 40 mg at 12/16/20 2142 .  polyethylene glycol (MIRALAX / GLYCOLAX) packet 17 g, 17 g, Oral, Daily PRN, Bhagat, Srishti L, MD .  senna-docusate (Senokot-S) tablet 1 tablet, 1 tablet, Oral, QHS PRN, Bhagat, Srishti L, MD .  simvastatin (ZOCOR) tablet 40 mg, 40 mg, Oral, q1800, Olivencia-Simmons, Ivelisse, NP, 40 mg at 12/16/20 1732 .  sodium chloride flush (NS) 0.9 % injection 3 mL, 3 mL, Intravenous, Once, Palumbo, April, MD .  [DISCONTINUED] thiamine (B-1) injection 100 mg, 100 mg, Intravenous, Daily, 100 mg at 12/13/20 1808 **OR** thiamine tablet 100 mg, 100 mg, Oral, Daily, Gleason, Otilio Carpen, PA-C, 100 mg at 12/17/20 1029 .  ticagrelor (BRILINTA) tablet 90 mg, 90 mg, Oral, BID, Garvin Fila, MD, 90 mg at 12/17/20  1029  Patients Current Diet:  Diet Order            DIET DYS 3 Room service appropriate? Yes; Fluid consistency: Thin  Diet effective now                 Precautions / Restrictions Precautions Precautions: Fall Precaution Comments: High Fall Risk s/p fall 3/2 with R knee abrasion and large right frontal scalp hematoma. R limb apraxia. Restrictions Weight Bearing Restrictions: No   Has the patient had 2 or more falls or a fall with injury in the past year?No  Prior Activity Level Community (5-7x/wk): working fulltime and driving  Prior Functional Level Prior Function Level of Independence: Independent Comments: Centre: Did the patient need  help bathing, dressing, using the toilet or eating?  Independent  Indoor Mobility: Did the patient need assistance with walking from room to room (with or without device)? Independent  Stairs: Did the patient need assistance with internal or external stairs (with or without device)? Independent  Functional Cognition: Did the patient need help planning regular tasks such as shopping or remembering to take medications? Independent  Home Assistive Devices / Equipment Home Assistive Devices/Equipment: None Home Equipment: Cane - single point,Crutches (walking stick)  Prior Device Use: Indicate devices/aids used by the patient prior to current illness, exacerbation or injury? None of the above  Current Functional Level Cognition  Arousal/Alertness: Awake/alert Overall Cognitive Status: Impaired/Different from baseline Current Attention Level: Selective Orientation Level: Oriented X4 Following Commands: Follows one step commands consistently Safety/Judgement: Decreased awareness of safety,Decreased awareness of deficits General Comments: Patient impulsive attempting to stand several times before this therapist instructed him to do so. Attention: Alternating Alternating Attention: Impaired Memory:  Impaired Memory Impairment: Decreased recall of new information,Decreased short term memory Decreased Short Term Memory: Verbal complex,Functional complex Problem Solving: Impaired Executive Function: Sequencing,Organizing Sequencing: Impaired Sequencing Impairment: Verbal complex Organizing: Impaired Organizing Impairment: Verbal complex,Functional complex Safety/Judgment: Impaired    Extremity Assessment (includes Sensation/Coordination)  Upper Extremity Assessment: Defer to OT evaluation RUE Deficits / Details: ROM overall WFL; 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 Pt attempts to use functionally however unaware of position of hand/arm and has difficulty manipulating objects; at risk for injuring R hand; unable to oppose thumb to digits RUE Sensation: decreased light touch,decreased proprioception RUE Coordination: decreased fine motor,decreased gross motor  Lower Extremity Assessment: Defer to PT evaluation RLE Deficits / Details: decreased coordination related to decr sensory input; inattention also likely contributing to inconsistent muscular support in standing RLE Sensation:  (to possibly absent--very delayed responses with 50% accuracy)    ADLs  Overall ADL's : Needs assistance/impaired Eating/Feeding: NPO Grooming: Moderate assistance Grooming Details (indicate cue type and reason): Patient able to wash face with Min assist and cues for use of affected RUE. Unable to feel food on R side of chin requiring increased cues for accuracy. Upper Body Bathing: Moderate assistance,Sitting Lower Body Bathing: Moderate assistance,Sit to/from stand Upper Body Dressing : Moderate assistance,Sitting Upper Body Dressing Details (indicate cue type and reason): Significant difficulty threading RUE through hospital gown 2/2 apraxia. Increased cues for attention to RUE 2/2 inattention. Lower Body Dressing: Maximal assistance Toilet Transfer: Moderate  assistance,+2 for physical assistance Toileting - Clothing Manipulation Details (indicate cue type and reason): Max A; foley Functional mobility during ADLs: Moderate assistance,+2 for physical assistance    Mobility  Overal bed mobility: Needs Assistance Bed Mobility: Supine to Sit,Sit to Supine Rolling: Supervision Sidelying to sit: Mod assist (up from rt side) Supine to sit: Supervision Sit to supine: Supervision General bed mobility comments: attempting to use RUE to raise torso with mod assist to complete    Transfers  Overall transfer level: Needs assistance Equipment used: 1 person hand held assist,Rolling walker (2 wheeled) Transfers: Sit to/from Stand Sit to Stand: Min guard General transfer comment: cues for hand placement    Ambulation / Gait / Stairs / Wheelchair Mobility  Ambulation/Gait Ambulation/Gait assistance: Herbalist (Feet): 150 Feet Assistive device: Rolling walker (2 wheeled),1 person hand held assist,None Gait Pattern/deviations: Step-through pattern General Gait Details: pt with slowed step-through gait, tends to dirft toward R side, has difficulty maintaining R hand on RW. Bumps into 2 computers on R side in  hallway Gait velocity: reduced Gait velocity interpretation: <1.8 ft/sec, indicate of risk for recurrent falls    Posture / Balance Dynamic Sitting Balance Sitting balance - Comments: right leaning; can correct with cues Balance Overall balance assessment: Needs assistance Sitting-balance support: No upper extremity supported,Feet supported Sitting balance-Leahy Scale: Good Sitting balance - Comments: right leaning; can correct with cues Standing balance support: No upper extremity supported Standing balance-Leahy Scale: Fair Standing balance comment: close supervision for static standing    Special needs/care consideration Fall in hospital 3/2 with right scalp hematoma Hgb A1c 6.8 Urine Drug screen positive    Previous Home  Environment  Living Arrangements: Alone  Lives With: Alone Available Help at Discharge: Family,Available 24 hours/day Type of Home: House Home Layout: One level,Other (Comment) (basement) Home Access: Stairs to enter Entrance Stairs-Number of Steps: 1 Bathroom Shower/Tub: Diplomatic Services operational officer Accessibility: Yes How Accessible: Accessible via walker,Accessible via wheelchair [place]: No  Discharge Living Setting Plans for Discharge Living Setting: Patient's home,Alone Type of Home at Discharge: House Discharge Home Layout: One level Discharge Home Access: Stairs to enter Entrance Stairs-Rails: None Entrance Stairs-Number of Steps: 1 Discharge Bathroom Shower/Tub: Tub/shower unit Discharge Bathroom Toilet: Standard Discharge Bathroom Accessibility: Yes How Accessible: Accessible via walker Does the patient have any problems obtaining your medications?: No  Social/Family/Support Systems Contact Information: daughter, Teran in [place] and girlfriend, [name], local Anticipated Caregiver: [name], girlfriend and his Mom Anticipated Caregiver's Contact Information: see above Ability/Limitations of Caregiver: [name] is an Therapist, sports who works 1 day per week as a Charity fundraiser Availability: 24/7 Discharge Plan Discussed with Primary Caregiver: Yes Is Caregiver In Agreement with Plan?: Yes Does Caregiver/Family have Issues with Lodging/Transportation while Pt is in Rehab?: No  Goals Patient/Family Goal for Rehab: Mod I to supervision with PT, OT and SLP Expected length of stay: [facility] 2 to 3 weeks Pt/Family Agrees to Admission and willing to participate: Yes Program Orientation Provided & Reviewed with Pt/Caregiver Including Roles  & Responsibilities: Yes  Decrease burden of Care through IP rehab admission: n/a  Possible need for SNF placement upon discharge:not anticipated  Patient Condition: This patient's condition  remains as documented in the consult dated 12/15/20, in which the Rehabilitation Physician determined and documented that the patient's condition is appropriate for intensive rehabilitative care in an inpatient rehabilitation facility. Will admit to inpatient rehab today.  Preadmission Screen Completed By: Danne Baxter RN MSN with updates by Bethel Born, [place], 12/17/2020 11:53 AM ______________________________________________________________________   Discussed status with Dr. Ranell Patrick on 12/17/20  at 11:53 AM and received approval for admission today.  Admission Coordinator: Danne Baxter RN MSN with updates by Bethel Born, time 11:54 AM/Date 12/17/20

## 2020-12-16 NOTE — Progress Notes (Signed)
Inpatient Rehabilitation Admissions Coordinator  I met at bedside with patient at bedside for rehab assessment. We discussed goals and expectations of a possible CIR admit. He asked me to call his girlfriend, [name], to discuss with her. I contacted [name] by phone. We also discussed goals and expectations of a possible  CIR admit. She states she and his Mom can provide assist after any rehab. I await insurance approval and CIR bed availability.  Danne Baxter, RN, MSN Rehab Admissions Coordinator [phone] 12/16/2020 2:23 PM

## 2020-12-16 NOTE — Progress Notes (Signed)
Occupational Therapy Treatment Patient Details Name: Dustin Golden MRN: [id number] DOB: 20-Oct-1955 Today's Date: 12/16/2020    History of present illness 65 year old male with past medical history significant for polysubstance abuse who presented as code stroke with aphasia and found to have occlusion of the left ICA.  Taken for thrombectomy left ICA at bulb and proximal left MCA M2 middle division occlusions, along with stent placement of left ICA at bulb stenosis, achieving a TICI 3 revascularization via right femoral approach 12/13/2020. MRI + Acute infarct left posterior MCA distribution involving the posterior insula in the left frontal parietal cortex. Small areas of acute infarct in the frontal lobes bilaterally.   OT comments  Patient met seated in recliner in agreement with OT treatment session. Patient's mother present at bedside. Session with focus on RUE NMR. Patient with significant difficulty threading RUE through anterior hospital gown 2/2 R limb apraxia and R inattention. Patient also requiring cues for hand placement prior to sit to stand from recliner with patient attempting to push from the window sill. Patient continues to be limited by deficits listed below indicating need for continued acute OT services to maximize safety and independence with self-care tasks. Recommendation for CIR remains appropriate.    Follow Up Recommendations  CIR;Supervision/Assistance - 24 hour    Equipment Recommendations  3 in 1 bedside commode    Recommendations for Other Services      Precautions / Restrictions Precautions Precautions: Fall Precaution Comments: High Fall Risk s/p fall 3/2 with R knee abrasion and large right frontal scalp hematoma. R limb apraxia. Restrictions Weight Bearing Restrictions: No       Mobility Bed Mobility Overal bed mobility: Needs Assistance Bed Mobility: Rolling;Sidelying to Sit;Sit to Supine Rolling: Supervision Sidelying to sit: Mod assist (up from rt  side)       General bed mobility comments: attempting to use RUE to raise torso with mod assist to complete    Transfers Overall transfer level: Needs assistance Equipment used: 1 person hand held assist Transfers: Sit to/from Stand Sit to Stand: Min assist         General transfer comment: Cues for hand placement with patient attempting to push from window sill 2/2 R inattention.    Balance Overall balance assessment: Needs assistance Sitting-balance support: No upper extremity supported;Feet supported Sitting balance-Leahy Scale: Fair     Standing balance support: During functional activity;No upper extremity supported Standing balance-Leahy Scale: Poor Standing balance comment: Requires at least unilateral UE support to maintain dynamic standing balance.                           ADL either performed or assessed with clinical judgement   ADL Overall ADL's : Needs assistance/impaired     Grooming: Moderate assistance Grooming Details (indicate cue type and reason): Patient able to wash face with Min assist and cues for use of affected RUE. Unable to feel food on R side of chin requiring increased cues for accuracy.         Upper Body Dressing : Moderate assistance;Sitting Upper Body Dressing Details (indicate cue type and reason): Significant difficulty threading RUE through hospital gown 2/2 apraxia. Increased cues for attention to RUE 2/2 inattention.                         Vision       Quarry manager  Arousal/Alertness: Awake/alert Behavior During Therapy: WFL for tasks assessed/performed Overall Cognitive Status: Impaired/Different from baseline Area of Impairment: Memory;Following commands;Safety/judgement;Awareness;Problem solving                   Current Attention Level: Selective Memory: Decreased short-term memory Following Commands: Follows one step commands consistently Safety/Judgement:  Decreased awareness of deficits;Decreased awareness of safety Awareness: Emergent Problem Solving: Slow processing;Difficulty sequencing;Requires verbal cues;Requires tactile cues General Comments: Patient impulsive attempting to stand several times before this therapist instructed him to do so.        Exercises Exercises: General Upper Extremity General Exercises - Upper Extremity Shoulder Flexion: AROM;Right;10 reps;Seated Elbow Flexion: AROM;Right;10 reps;Seated Elbow Extension: AROM;Right;10 reps;Seated Wrist Flexion: AROM;Right;10 reps;Seated Wrist Extension: AROM;10 reps;Right;Seated Digit Composite Flexion: AROM;Right;10 reps;Seated Composite Extension: AROM;Right;10 reps;Seated   Shoulder Instructions       General Comments Mother present at bedside at start of treatment session.    Pertinent Vitals/ Pain       Pain Assessment: No/denies pain  Home Living                                          Prior Functioning/Environment              Frequency  Min 2X/week        Progress Toward Goals  OT Goals(current goals can now be found in the care plan section)  Progress towards OT goals: Progressing toward goals  Acute Rehab OT Goals Patient Stated Goal: return to baseline OT Goal Formulation: With patient/family Time For Goal Achievement: 12/28/20 Potential to Achieve Goals: Good ADL Goals Pt Will Perform Grooming: with set-up;with supervision;sitting Pt Will Perform Upper Body Bathing: with set-up;with supervision;sitting Pt Will Perform Lower Body Bathing: with min guard assist;sit to/from stand Pt Will Transfer to Toilet: stand pivot transfer;bedside commode;with min guard assist Pt Will Perform Toileting - Clothing Manipulation and hygiene: with supervision;sitting/lateral leans Additional ADL Goal #1: Pt will demonstrate ability to position RUE during ADL and functional transfers to prevent injury with S  Plan Discharge plan remains  appropriate;Frequency remains appropriate    Co-evaluation                 AM-PAC OT "6 Clicks" Daily Activity     Outcome Measure   Help from another person eating meals?: Total Help from another person taking care of personal grooming?: A Lot Help from another person toileting, which includes using toliet, bedpan, or urinal?: A Lot Help from another person bathing (including washing, rinsing, drying)?: A Lot Help from another person to put on and taking off regular upper body clothing?: A Lot Help from another person to put on and taking off regular lower body clothing?: A Lot 6 Click Score: 11    End of Session Equipment Utilized During Treatment: Gait belt  OT Visit Diagnosis: Unsteadiness on feet (R26.81);Other abnormalities of gait and mobility (R26.89);Muscle weakness (generalized) (M62.81);Low vision, both eyes (H54.2);Apraxia (R48.2);Other symptoms and signs involving cognitive function   Activity Tolerance Patient tolerated treatment well   Patient Left in bed;with call bell/phone within reach;with family/visitor present   Nurse Communication Mobility status        Time: 3903-0092 OT Time Calculation (min): 13 min  Charges: OT General Charges $OT Visit: 1 Visit OT Treatments $Neuromuscular Re-education: 8-22 mins  Calinda Stockinger H. OTR/L Supplemental OT, Department of rehab services [phone]  [place]. 12/16/2020, 3:22 PM

## 2020-12-16 NOTE — Progress Notes (Signed)
  Speech Language Pathology Treatment: Dysphagia  Patient Details Name: Dustin Golden MRN: [id number] DOB: 02/14/1956 Today's Date: 12/16/2020 Time: 1510-1530 SLP Time Calculation (min) (ACUTE ONLY): 20 min  Assessment / Plan / Recommendation Clinical Impression  Pt seen for a skilled tx with min verbal cues provided for right lingual sweep and awareness of anterior loss on right d/t inattention/sensory impairment with Dysphagia 3/thin liquids via straw.  SLP utilized teach back to ensure pt understood and executed strategy during tx session and for future meals.  Pt acknowledged he "sometimes drools" at night and has salivary pooling during the day, but is becoming more aware of this deficit.  No s/s of aspiration noted throughout trial, but pt is at a mild risk for aspiration without swallowing strategies.  ST will continue to f/u for diet tolerance and speech/cognitive deficits.   HPI HPI: 65 y.o. male with past medical history significant for ongoing tobacco abuse, alcohol use and drug use (marijuana and possibly other substances) per EMS report per bystander report. 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 Small areas of acute infarct in the frontal lobes bilaterally. Pt s/p successful mechanical thrombectomy of the proximal left M2's/MCA middle division occlusion.      SLP Plan  Continue with current plan of care       Recommendations  Diet recommendations: Dysphagia 3 (mechanical soft);Thin liquid Liquids provided via: Cup;Straw Medication Administration: Whole meds with puree Supervision: Patient able to self feed;Intermittent supervision to cue for compensatory strategies Compensations: Lingual sweep for clearance of pocketing;Slow rate;Small sips/bites                Oral Care Recommendations: Oral care BID Follow up Recommendations: Inpatient Rehab SLP Visit Diagnosis: Dysphagia, oral  phase (R13.11) Plan: Continue with current plan of care                       Elvina Sidle, M.S., [place] 12/16/2020, 11:51 PM

## 2020-12-16 NOTE — H&P (Signed)
Physical Medicine and Rehabilitation Admission H&P    Chief Complaint  Patient presents with  . Code Stroke    Right sided weakness, Slurred Speech, Right Side Facial Droop/Drooling   : HPI: Dustin Golden is a 65 year old right-handed male with history of tobacco and alcohol use and marijuana on no prescription medications.  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  1 level home.  Presented 12/13/2020 with acute onset of right-sided weakness and aphasia.  Admission chemistries unremarkable except glucose 117 creatinine 1.28 alcohol negative, urine drug screen positive cocaine as well as marijuana.  Cranial CT scan negative.  Patient did not receive TPA.  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  30 to 50% stenosis of both vertebral artery origins.  Patient underwent emergent mechanical thrombectomy per interventional radiology.  MRI/MRA showed acute infarction left posterior MCA distribution involving the posterior insula and the left frontal parietal cortex.  Small areas of acute infarction in the frontal lobes bilaterally.  MRA demonstrated loss of signal in the right posterior cerebral artery.  Echocardiogram with ejection fraction of 35 to 38% grade 1 diastolic dysfunction.  Neurology follow-up currently maintained on aspirin as well as Brilinta for CVA prophylaxis.  Maintain on a mechanical soft thin liquid diet.  Patient did have a reported fall 12/15/2020 sustaining small abrasion on the right knee and large right facial scalp hematoma patient remained awake alert and interactive unchanged from baseline with follow-up per neurology services and received follow-up cranial CT scan after a fall redemonstrating moderate to large acute early subacute cortical and subcortical left MCA infarct no acute changes. Therapy evaluations  completed due to patient's right side weakness and aphasia was admitted for a comprehensive rehab program. He currently has no complaints.   Review of Systems  Constitutional: Negative for chills and fever.  HENT: Negative for hearing loss.   Eyes: Negative for blurred vision and double vision.  Respiratory: Negative for cough and shortness of breath.   Cardiovascular: Negative for chest pain, palpitations and leg swelling.  Gastrointestinal: Positive for constipation. Negative for heartburn, nausea and vomiting.  Genitourinary: Negative for dysuria, flank pain and hematuria.  Musculoskeletal: Positive for joint pain and myalgias.  Skin: Negative for rash.  Neurological: Positive for speech change and weakness.  All other systems reviewed and are negative.  No past medical history on file. Past Surgical History:  Procedure Laterality Date  . APPENDECTOMY    . gastric ulcer surgery    . IR CT HEAD LTD  12/13/2020  . IR INTRAVSC STENT CERV CAROTID W/EMB-PROT MOD SED INCL ANGIO  12/14/2020  . IR PERCUTANEOUS ART THROMBECTOMY/INFUSION INTRACRANIAL INC DIAG ANGIO  12/13/2020  . IR US GUIDE VASC ACCESS RIGHT  12/13/2020  . RADIOLOGY WITH ANESTHESIA N/A 12/13/2020   Procedure: IR WITH ANESTHESIA;  Surgeon: Luanne Bras, MD;  Location: [facility];  Service: Radiology;  Laterality: N/A;   No family history on file. Social History:  reports that he has been smoking. He does not have any smokeless tobacco history on file. He reports current alcohol use. He reports current drug use. Drug: Marijuana. Allergies:  Allergies  Allergen Reactions  . Ibuprofen Other (See Comments)    Nosebleeds   . Sulfa Antibiotics Rash and Other (See Comments)    "Blisters to skin with cream"  .  Sulfamethoxazole Rash and Other (See Comments)    "Blisters to skin with cream"   Medications Prior to Admission  Medication Sig Dispense Refill  . BAYER ASPIRIN 325 MG EC tablet Take 325 mg by mouth as needed for pain.       Drug Regimen Review Drug regimen was reviewed and remains appropriate with no significant issues identified  Home: Home Living Family/patient expects to be discharged to:: Private residence Living Arrangements: Alone Available Help at Discharge: Family,Available 24 hours/day Type of Home: House Home Access: Stairs to enter [employer] of Steps: 1 Home Layout: One level,Other (Comment) (basement) Bathroom Shower/Tub: Tub/shower unit,Curtain Biochemist, clinical: Standard Bathroom Accessibility: Yes Home Equipment: Cane - single point,Crutches (walking stick)  Lives With: Alone   Functional History: Prior Function Level of Independence: Independent Comments: Truck driver - Box truck  Functional Status:  Mobility: Bed Mobility Overal bed mobility: Needs Assistance Bed Mobility: Rolling,Sidelying to Sit,Sit to Supine Rolling: Supervision Sidelying to sit: Mod assist (up from rt side) Sit to supine: Min assist General bed mobility comments: attempting to use RUE to raise torso with mod assist to complete Transfers Overall transfer level: Needs assistance Equipment used: 1 person hand held assist Transfers: Sit to/from Stand Sit to Stand: Min assist General transfer comment: pt with lean to right and requires cues to achieve midline Ambulation/Gait Ambulation/Gait assistance: Min assist Gait Distance (Feet): 1 Feet Assistive device: 2 person hand held assist Gait Pattern/deviations: Step-to pattern General Gait Details: pre-gait activities at bedside (fwd/backward stepping with each foot: sidestepping to his left x 4 ft); currently recommend +2 for lines and safety to progress to gait    ADL: ADL Overall ADL's : Needs assistance/impaired Eating/Feeding: NPO Grooming: Moderate assistance Upper Body Bathing: Moderate assistance,Sitting Lower Body Bathing: Moderate assistance,Sit to/from stand Upper Body Dressing : Moderate assistance,Sitting Lower Body  Dressing: Maximal assistance Toilet Transfer: Moderate assistance,+2 for physical assistance Toileting - Clothing Manipulation Details (indicate cue type and reason): Max A; foley Functional mobility during ADLs: Moderate assistance,+2 for physical assistance  Cognition: Cognition Overall Cognitive Status: Impaired/Different from baseline Arousal/Alertness: Awake/alert Orientation Level: Oriented X4 Attention: Alternating Alternating Attention: Impaired Memory: Impaired Memory Impairment: Decreased recall of new information,Decreased short term memory Decreased Short Term Memory: Verbal complex,Functional complex Problem Solving: Impaired Executive Function: Sequencing,Organizing Sequencing: Impaired Sequencing Impairment: Verbal complex Organizing: Impaired Organizing Impairment: Verbal complex,Functional complex Safety/Judgment: Impaired Cognition Arousal/Alertness: Awake/alert Behavior During Therapy: WFL for tasks assessed/performed Overall Cognitive Status: Impaired/Different from baseline Area of Impairment: Memory,Following commands,Safety/judgement,Awareness,Problem solving Current Attention Level: Selective Memory: Decreased short-term memory Following Commands: Follows one step commands consistently Safety/Judgement: Decreased awareness of deficits Awareness: Emergent Problem Solving: Slow processing,Difficulty sequencing,Requires verbal cues,Requires tactile cues General Comments: slow processing along with requiring incr time to activate rt hemibody  Physical Exam: Blood pressure (!) 146/90, pulse 84, temperature 98.1 F (36.7 C), temperature source Oral, resp. rate 18, weight 88 kg, SpO2 99 %. Gen: no distress, normal appearing, eating lunch independently HEENT: oral mucosa pink and moist, Right scalp hematoma with dressing in place Cardio: Reg rate Chest: normal effort, normal rate of breathing Abd: soft, non-distended Ext: no edema Psych: pleasant, normal  affect Skin: intact Neurological:     Comments: Patient is alert in no acute distress.  Makes eye contact with examiner.  Did have some mild expressive difficulties and dysarthria.  Follows simple commands. 5/5 strength except for 4/5 in right hand grip and hip flexors  Results for orders placed or performed during the hospital encounter  of 12/13/20 (from the past 48 hour(s))  Glucose, capillary     Status: None   Collection Time: 12/14/20  8:20 AM  Result Value Ref Range   Glucose-Capillary 98 70 - 99 mg/dL    Comment: Glucose reference range applies only to samples taken after fasting for at least 8 hours.  Magnesium     Status: None   Collection Time: 12/14/20 10:31 AM  Result Value Ref Range   Magnesium 2.1 1.7 - 2.4 mg/dL    Comment: Performed at [facility] [address], [place], [place] [address]  Phosphorus     Status: None   Collection Time: 12/14/20 10:31 AM  Result Value Ref Range   Phosphorus 3.4 2.5 - 4.6 mg/dL    Comment: Performed at [facility] [address], [place], [place] [address]  Glucose, capillary     Status: Abnormal   Collection Time: 12/14/20 12:04 PM  Result Value Ref Range   Glucose-Capillary 108 (H) 70 - 99 mg/dL    Comment: Glucose reference range applies only to samples taken after fasting for at least 8 hours.  Glucose, capillary     Status: None   Collection Time: 12/14/20  4:53 PM  Result Value Ref Range   Glucose-Capillary 87 70 - 99 mg/dL    Comment: Glucose reference range applies only to samples taken after fasting for at least 8 hours.  Glucose, capillary     Status: Abnormal   Collection Time: 12/14/20  7:58 PM  Result Value Ref Range   Glucose-Capillary 103 (H) 70 - 99 mg/dL    Comment: Glucose reference range applies only to samples taken after fasting for at least 8 hours.  Glucose, capillary     Status: None   Collection Time: 12/15/20 12:11 AM  Result Value Ref Range   Glucose-Capillary 89 70 - 99 mg/dL     Comment: Glucose reference range applies only to samples taken after fasting for at least 8 hours.  CBC     Status: Abnormal   Collection Time: 12/15/20  2:35 AM  Result Value Ref Range   WBC 9.7 4.0 - 10.5 K/uL   RBC 4.65 4.22 - 5.81 MIL/uL   Hemoglobin 12.2 (L) 13.0 - 17.0 g/dL   HCT 37.0 (L) 39.0 - 52.0 %   MCV 79.6 (L) 80.0 - 100.0 fL   MCH 26.2 26.0 - 34.0 pg   MCHC 33.0 30.0 - 36.0 g/dL   RDW 15.1 11.5 - 15.5 %   Platelets 197 150 - 400 K/uL   nRBC 0.0 0.0 - 0.2 %    Comment: Performed at [facility], [place] [address], [place], [place] [address]  Basic metabolic panel     Status: Abnormal   Collection Time: 12/15/20  2:35 AM  Result Value Ref Range   Sodium 137 135 - 145 mmol/L   Potassium 3.7 3.5 - 5.1 mmol/L   Chloride 106 98 - 111 mmol/L   CO2 18 (L) 22 - 32 mmol/L   Glucose, Bld 87 70 - 99 mg/dL    Comment: Glucose reference range applies only to samples taken after fasting for at least 8 hours.   BUN 10 8 - 23 mg/dL   Creatinine, Ser 1.08 0.61 - 1.24 mg/dL   Calcium 8.6 (L) 8.9 - 10.3 mg/dL   GFR, Estimated >60 >60 mL/min    Comment: (NOTE) Calculated using the CKD-EPI Creatinine Equation (2021)    Anion gap 13 5 -  15    Comment: Performed at [facility], [place] [address] [place], [place] [address]  Phosphorus     Status: None   Collection Time: 12/15/20  2:35 AM  Result Value Ref Range   Phosphorus 4.2 2.5 - 4.6 mg/dL    Comment: Performed at [facility] [address], [place], [place] [address]  Magnesium     Status: None   Collection Time: 12/15/20  2:35 AM  Result Value Ref Range   Magnesium 2.0 1.7 - 2.4 mg/dL    Comment: Performed at [facility] [address] [place], [place] [address]  Glucose, capillary     Status: Abnormal   Collection Time: 12/15/20  8:10 AM  Result Value Ref Range   Glucose-Capillary 142 (H) 70 - 99 mg/dL    Comment: Glucose reference range applies only to samples taken after fasting for at least 8  hours.  Glucose, capillary     Status: None   Collection Time: 12/15/20 11:39 AM  Result Value Ref Range   Glucose-Capillary 90 70 - 99 mg/dL    Comment: Glucose reference range applies only to samples taken after fasting for at least 8 hours.  Glucose, capillary     Status: None   Collection Time: 12/15/20  3:44 PM  Result Value Ref Range   Glucose-Capillary 97 70 - 99 mg/dL    Comment: Glucose reference range applies only to samples taken after fasting for at least 8 hours.  Glucose, capillary     Status: Abnormal   Collection Time: 12/15/20  8:17 PM  Result Value Ref Range   Glucose-Capillary 344 (H) 70 - 99 mg/dL    Comment: Glucose reference range applies only to samples taken after fasting for at least 8 hours.  Glucose, capillary     Status: Abnormal   Collection Time: 12/16/20 12:12 AM  Result Value Ref Range   Glucose-Capillary 115 (H) 70 - 99 mg/dL    Comment: Glucose reference range applies only to samples taken after fasting for at least 8 hours.  Glucose, capillary     Status: Abnormal   Collection Time: 12/16/20  5:21 AM  Result Value Ref Range   Glucose-Capillary 150 (H) 70 - 99 mg/dL    Comment: Glucose reference range applies only to samples taken after fasting for at least 8 hours.   CT HEAD WO CONTRAST  Result Date: 12/15/2020 CLINICAL DATA:  Head trauma, minor, normal mental status. Additional history provided fall while getting out of bed, large right frontal scalp hematoma. EXAM: CT HEAD WITHOUT CONTRAST TECHNIQUE: Contiguous axial images were obtained from the base of the skull through the vertex without intravenous contrast. COMPARISON:  MRI/MRA head 12/13/2020. Report from thrombectomy 12/13/2020. noncontrast head CT 12/05/2020. CT angiogram head/neck 12/13/2020. FINDINGS: Brain: Cerebral volume is normal. Redemonstrated acute/early subacute cortical and subcortical left MCA territory infarct within the left frontal and parietal lobes as well as left insula. Mild  curvilinear hyperdensity within the left frontoparietal operculum infarction territory, which was not definitively present on the post procedural CT of 12/13/2020. No acute intracranial hemorrhage is identified elsewhere. The left MCA territory infarct has not appreciably increased in extent. No significant mass effect. No midline shift. Vascular: Atherosclerotic calcifications. Skull: Normal. Negative for fracture or focal lesion. Sinuses/Orbits: No significant paranasal sinus disease at the imaged levels. Other: Trace fluid within the right mastoid air cells. Prominent right anterior scalp/forehead hematoma. These results will be called to the ordering clinician  or representative by the Radiologist Assistant, and communication documented in the PACS or [employer]. IMPRESSION: Redemonstrated moderate to large acute/early subacute cortical and subcortical left MCA territory infarct. Mild curvilinear hyperdensity is present within the left frontoparietal operculum infarction territory, not definitively present on the prior postprocedural head CT of 12/13/2020. This may reflect petechial hemorrhage. Alternatively, this may reflect residual thrombus within a left M2/M3 vessel. No acute intracranial hemorrhage is identified elsewhere. Prominent right anterior scalp/forehead hematoma. Electronically Signed   By: Kellie Simmering DO   On: 12/15/2020 18:42   IR INTRAVSC STENT CERV CAROTID W/EMB-PROT MOD SED  Result Date: 12/14/2020 INDICATION: 65 year old male with past medical history significant for tobacco use, alcohol and possible drug abuse. Patient presented severe aphasia and right-sided weakness. Initial NIHSS 22 later improving to 10. Head CT showed hypodensity in the left posterior frontal region (aspects 9) CT/CT angiogram of the head and neck showed a left ICA occlusion at the bulb with intracranial reconstitution in a left M3/MCA branch occlusion. Additionally patient head high-grade stenosis of the  cervical right ICA and mild stenosis at the origin of the bilateral vertebral arteries. CT perfusion showed a core infarct of 16 mL with a 58 mL penumbra. He was then transferred to our service for a diagnostic cerebral angiogram with mechanical thrombectomy. EXAM: Ultrasound-guided vascular access Diagnostic cerebral angiogram Mechanical thrombectomy Flat panel head CT Left carotid stenting with cerebral protection device COMPARISON:  CT/CT angiogram of December 13, 2020 MEDICATIONS: IV cangrelor utilized prior to stent deployment ANESTHESIA/SEDATION: The procedure was performed in the general anesthesia. CONTRAST:  95 mL of Omnipaque 240 milligram/mL FLUOROSCOPY TIME:  Fluoroscopy Time: 38 minutes 54 seconds (970 mGy). COMPLICATIONS: None immediate. TECHNIQUE: Informed written consent was not obtained given patient's severe aphasia and no reachable healthcare proxy or next of kin available. Maximal Sterile Barrier Technique was utilized including caps, mask, sterile gowns, sterile gloves, sterile drape, hand hygiene and skin antiseptic. A timeout was performed prior to the initiation of the procedure. The right groin was prepped and draped in the usual sterile fashion. Using a micropuncture kit and the modified Seldinger technique, access was gained to the right common femoral artery and an 8 French sheath was placed. Real-time ultrasound guidance was utilized for vascular access including the acquisition of a permanent ultrasound image documenting patency of the accessed vessel. Under fluoroscopy, an 8 [place] Walrus balloon guide catheter was navigated over a 6 [place] Berenstein 2 catheter and a 0.035" Terumo Glidewire into the aortic arch. The catheter was placed into the left common carotid artery. Frontal and lateral angiograms of the neck were obtained. Under biplane roadmap, a zoom 71 aspiration catheter was navigated over a phenom 21 microcatheter and a Aristotle 14 microguidewire into the cavernous segment  of the left ICA. The balloon guide catheter was advanced into the upper cervical segment of the left ICA. Frontal and lateral angiograms of the head were obtained via aspiration catheter contrast injection. FINDINGS: 1. Occlusion of the left ICA in the neck at the level of the bulb. 2. Occlusion of a proximal left M2/MCA middle division branch. 3. Nonocclusive filling defect within a left A4/ACA branch. 4. Patent right common femoral artery. PROCEDURE: Magnified frontal and lateral angiograms of the head were obtained in used as biplane roadmap. The microcatheter was then navigated over the wire into the left M3/MCA middle division branch. Then, a 4 x 40 mm solitaire stent retriever was deployed spanning the M2 and proximal M3 segment. The device was  allowed to intercalated with the clot for 4 minutes. The microcatheter was removed. The aspiration catheter was advanced to the level of occlusion and connected to a penumbra aspiration pump. The guiding catheter balloon was inflated. The thrombectomy device and aspiration catheter were removed under constant aspiration. Follow-up left ICA angiogram showed complete recanalization the left MCA vascular tree. The guiding catheter was then retracted into the left common carotid artery. Frontal and lateral angiograms of the neck were obtained. Patent left ICA noted at the bulb with significant luminal irregularity and filling defect. Left common carotid artery angiograms with frontal and lateral of the head showed for opacification of the left MCA branches which remain open. Flat panel CT of the head was obtained and post processed in a separate workstation with concurrent attending physician supervision. Selected images were sent to PACS. No evidence of hemorrhagic complication noted. Follow-up left common carotid artery angiogram showed worsening of the left ICA stenosis at the bulb with near occlusion and slow flow distally. Patient was loaded on cangrelor in  anticipation to stent the placement. Under biplane roadmap, a 4-7 Emboshield NAV6 cerebral protection device was navigated into the distal cervical segment of the left ICA. Subsequently, a 9-7 x 40 mm XACT carotid stent was deployed across the area of stenosis in the left ICA. Then, in stent stenosis was performed with a 4 x 30 mm Viatrac balloon. The cerebral protection device was recaptured. Follow-up left ICA angiogram showed good position of the stent with significantly improved anterograde flow. Left ICA angiograms with frontal and lateral views of the head showed no evidence of thromboembolic complication with significant improvement of the anterograde flow. Delayed left ICA angiogram showed no evidence of in stent clot formation. Delayed cranial angiograms appear stable. The system was subsequently withdrawn. Left common femoral artery angiograms were obtained with frontal and lateral views. The puncture is at the level of the common femoral artery which shows mild atherosclerotic change with adequate caliber for closure device utilization. The femoral sheath thus exchanged for a Perclose ProGlide which was utilized for access closure. Immediate hemostasis was achieved. IMPRESSION: 1. Successful mechanical thrombectomy for treatment of a left M2/MCA occlusion with a single pass of combined aspiration plus stent retriever achieving complete recanalization (TICI3). 2. Stenting and angioplasty of left ICA severe stenosis with cerebral protection device. PLAN: Patient is to continue on cangrelor drip until follow-up imaging. Then, transition to dual anti-platelet therapy with aspirin and Brilinta. Electronically Signed   By: [name] M.D.   On: 12/14/2020 12:36   DG Swallowing Func-Speech Pathology  Result Date: 12/14/2020 Objective Swallowing Evaluation: Type of Study: MBS-Modified Barium Swallow Study  Patient Details Name: RHETT MUTSCHLER MRN: [id number] Date of Birth: [birth year] Today's  Date: 12/14/2020 Time: SLP Start Time (ACUTE ONLY): 1315 -SLP Stop Time (ACUTE ONLY): 1335 SLP Time Calculation (min) (ACUTE ONLY): 20 min Past Medical History: No past medical history on file. Past Surgical History: Past Surgical History: Procedure Laterality Date . APPENDECTOMY   . gastric ulcer surgery   . IR CT HEAD LTD  12/13/2020 . IR INTRAVSC STENT CERV CAROTID W/EMB-PROT MOD SED INCL ANGIO  12/14/2020 . IR PERCUTANEOUS ART THROMBECTOMY/INFUSION INTRACRANIAL INC DIAG ANGIO  12/13/2020 . IR US GUIDE VASC ACCESS RIGHT  12/13/2020 . RADIOLOGY WITH ANESTHESIA N/A 12/13/2020  Procedure: IR WITH ANESTHESIA;  Surgeon: Luanne Bras, MD;  Location: [facility];  Service: Radiology;  Laterality: N/A; HPI: 65 y.o. male with past medical history significant for ongoing tobacco  abuse, alcohol use and drug use (marijuana and possibly other substances) per EMS report per bystander report. 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 Small areas of acute infarct in the frontal lobes bilaterally. Pt s/p successful mechanical thrombectomy of the proximal left M2's/MCA middle division occlusion.  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 Right sided oral motor deficits from CVA allowed for frequent right sided anterior spillage, reduced bolus cohesion, right sided oral residuals, and premature spillage of bolus. Pharyngeally pt with reduced timing and efficiency of laryngeal vestibule closure with an instance of trace transient penetration of thins and one instance of trace silent aspiration of thins by cup on initial series. Subsequent trials of thins via cup and straw were without penetration or aspiration (including mixed consistency with barium tablet series). Mild  vallecular and pyriform sinsus residuasl noted with POs, clearing with reflexive swallows. Recommend dysphagia 3 (mechanical soft) and thin liquids with meds in puree. SLP to follow up. SLP Visit Diagnosis Dysphagia, oropharyngeal phase (R13.12) Attention and concentration deficit following -- Frontal lobe and executive function deficit following -- Impact on safety and function Mild aspiration risk;Moderate aspiration risk   CHL IP TREATMENT RECOMMENDATION 12/14/2020 Treatment Recommendations Therapy as outlined in treatment plan below   Prognosis 12/14/2020 Prognosis for Safe Diet Advancement Good Barriers to Reach Goals Time post onset Barriers/Prognosis Comment -- CHL IP DIET RECOMMENDATION 12/14/2020 SLP Diet Recommendations Dysphagia 3 (Mech soft) solids;Thin liquid Liquid Administration via Cup;Straw Medication Administration Whole meds with puree Compensations Minimize environmental distractions;Slow rate;Small sips/bites;Lingual sweep for clearance of pocketing;Clear throat intermittently Postural Changes Remain semi-upright after after feeds/meals (Comment);Seated upright at 90 degrees   CHL IP OTHER RECOMMENDATIONS 12/14/2020 Recommended Consults -- Oral Care Recommendations Oral care BID Other Recommendations --   [facility]L IP FOLLOW UP RECOMMENDATIONS 12/14/2020 Follow up Recommendations Inpatient Rehab   [facility]L IP FREQUENCY AND DURATION 12/14/2020 Speech Therapy Frequency (ACUTE ONLY) min 2x/week Treatment Duration 2 weeks      [facility]L IP ORAL PHASE 12/14/2020 Oral Phase Impaired Oral - Pudding Teaspoon -- Oral - Pudding Cup -- Oral - Honey Teaspoon -- Oral - Honey Cup -- Oral - Nectar Teaspoon -- Oral - Nectar Cup Right pocketing in lateral sulci;Lingual/palatal residue;Right anterior bolus loss Oral - Nectar Straw -- Oral - Thin Teaspoon -- Oral - Thin Cup Lingual/palatal residue;Right anterior bolus loss;Right pocketing in lateral sulci Oral - Thin Straw Right anterior bolus loss;Lingual/palatal residue Oral - Puree  Lingual/palatal residue;Right pocketing in lateral sulci;Delayed oral transit Oral - Mech Soft Right pocketing in lateral sulci;Lingual/palatal residue;Decreased bolus cohesion;Delayed oral transit Oral - Regular -- Oral - Multi-Consistency -- Oral - Pill Lingual/palatal residue;Right anterior bolus loss;Delayed oral transit Oral Phase - Comment --  CHL IP PHARYNGEAL PHASE 12/14/2020 Pharyngeal Phase Impaired Pharyngeal- Pudding Teaspoon -- Pharyngeal -- Pharyngeal- Pudding Cup -- Pharyngeal -- Pharyngeal- Honey Teaspoon -- Pharyngeal -- Pharyngeal- Honey Cup -- Pharyngeal -- Pharyngeal- Nectar Teaspoon -- Pharyngeal -- Pharyngeal- Nectar Cup Delayed swallow initiation-vallecula;Pharyngeal residue - pyriform;Pharyngeal residue - valleculae;Reduced laryngeal elevation;Reduced tongue base retraction Pharyngeal -- Pharyngeal- Nectar Straw -- Pharyngeal -- Pharyngeal- Thin Teaspoon -- Pharyngeal -- Pharyngeal- Thin Cup Reduced laryngeal elevation;Reduced tongue base retraction;Pharyngeal residue - valleculae;Pharyngeal residue - pyriform;Penetration/Aspiration before swallow;Penetration/Aspiration during swallow;Reduced epiglottic inversion;Reduced airway/laryngeal closure;Delayed swallow initiation-pyriform sinuses Pharyngeal Material enters airway, passes BELOW  cords without attempt by patient to eject out (silent aspiration);Material enters airway, remains ABOVE vocal cords and not ejected out;Material does not enter airway Pharyngeal- Thin Straw Pharyngeal residue - valleculae;Pharyngeal residue - pyriform;Delayed swallow initiation-pyriform sinuses;Reduced tongue base retraction;Reduced laryngeal elevation Pharyngeal -- Pharyngeal- Puree Reduced tongue base retraction;Reduced laryngeal elevation;Delayed swallow initiation-vallecula;Pharyngeal residue - valleculae;Pharyngeal residue - pyriform Pharyngeal -- Pharyngeal- Mechanical Soft Delayed swallow initiation-vallecula;Reduced laryngeal elevation;Reduced tongue base  retraction;Pharyngeal residue - valleculae;Pharyngeal residue - pyriform Pharyngeal -- Pharyngeal- Regular -- Pharyngeal -- Pharyngeal- Multi-consistency -- Pharyngeal -- Pharyngeal- Pill Pharyngeal residue - pyriform;Pharyngeal residue - valleculae;Reduced tongue base retraction;Reduced laryngeal elevation Pharyngeal -- Pharyngeal Comment --  CHL IP CERVICAL ESOPHAGEAL PHASE 12/14/2020 Cervical Esophageal Phase WFL Pudding Teaspoon -- Pudding Cup -- Honey Teaspoon -- Honey Cup -- Nectar Teaspoon -- Nectar Cup -- Nectar Straw -- Thin Teaspoon -- Thin Cup -- Thin Straw -- Puree -- Mechanical Soft -- Regular -- Multi-consistency -- Pill -- Cervical Esophageal Comment -- Hayden Rasmussen MA, CCC-SLP Acute Rehabilitation Services 12/14/2020, 2:09 PM                  Medical Problem List and Plan: 1.  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  Status post mechanical thrombectomy  -patient may shower  -ELOS/Goals: modI in 5-7 days  -Continue CIR 2.  Antithrombotics: -DVT/anticoagulation: Continue SCDs  -antiplatelet therapy: Aspirin 81 mg daily and Brilinta 90 mg twice daily 3. Pain Management: Continue Tylenol as needed 4. Mood: Provide emotional support  -antipsychotic agents: N/A 5. Neuropsych: This patient is capable of making decisions on his own behalf. 6. Skin/Wound Care: Routine skin checks 7. Fluids/Electrolytes/Nutrition: Routine in and outs with follow-up chemistries 8.  Hypertension.  Norvasc 10 mg daily.  Monitor with increased mobility  3/4: diastolic BP elevated: provide dietary education.  9.  History of alcohol tobacco as well as polysubstance abuse.  Provide counseling 10.  Hyperlipidemia.  Zocor 11. Mechanical fall 12/15/2020. Sustained small abrasion right knee and large right facial scalp hematoma. Follow-up CT scan unchanged  I have personally performed a face to face diagnostic evaluation,  including, but not limited to relevant history and physical exam findings, of this patient and developed relevant assessment and plan.  Additionally, I have reviewed and concur with the physician assistant's documentation above.  Leeroy Cha, MD  Lavon Paganini [place], PA-C 12/16/2020

## 2020-12-16 NOTE — Progress Notes (Signed)
Inpatient Rehabilitation Admissions Coordinator  I have received insurance approval for CIR, but no bed available to admit to today. I met at bedside to inform patient as well as review cost of care. We will follow up tomorrow to clarify bed availability.  Danne Baxter, RN, MSN Rehab Admissions Coordinator [phone] 12/16/2020 3:30 PM

## 2020-12-16 NOTE — Plan of Care (Signed)
  Problem: Safety: Goal: Non-violent Restraint(s) Outcome: Progressing   Problem: Education: Goal: Knowledge of disease or condition will improve Outcome: Progressing Goal: Knowledge of secondary prevention will improve Outcome: Progressing Goal: Knowledge of patient specific risk factors addressed and post discharge goals established will improve Outcome: Progressing   Problem: Nutrition: Goal: Risk of aspiration will decrease Outcome: Progressing Goal: Dietary intake will improve Outcome: Progressing   Problem: Education: Goal: Knowledge of General Education information will improve Description: Including pain rating scale, medication(s)/side effects and non-pharmacologic comfort measures Outcome: Progressing

## 2020-12-16 NOTE — Discharge Summary (Signed)
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 S/p fall on 3/2 with headstrike 3. LVH 4. CHF: Grade 1 Diastolic dysfunction 5. Polysubstance abuse (tobacco, alcohol, THC, cocaine) 6. Current every day cigarette smoker 7. HLD 8. HTN 9. Elevated HgbA1C of  6.8 10. Aphasia 11. Right facial droop 12. Right sided weakness of UE and LE   Active Problems:   Stroke (HCC)   Polysubstance (excluding opioids) dependence (HCC)  Medications to be continued on Rehab  Allergies as of 12/17/2020      Reactions   Ibuprofen Other (See Comments)   Nosebleeds   Sulfa Antibiotics Rash, Other (See Comments)   "Blisters to skin with cream"   Sulfamethoxazole Rash, Other (See Comments)   "Blisters to skin with cream"      Medication List    STOP taking these medications   Bayer Aspirin 325 MG EC tablet Generic drug: aspirin Replaced by: aspirin 81 MG chewable tablet     TAKE these medications   acetaminophen 325 MG tablet Commonly known as: TYLENOL Take 2 tablets (650 mg total) by mouth every 4 (four) hours as needed for mild pain (or temp > 37.5 C (99.5 F)).   amLODipine 10 MG tablet Commonly known as: NORVASC Take 1 tablet (10 mg total) by mouth daily. Start taking on: December 18, 2020   aspirin 81 MG chewable tablet Chew 1 tablet (81 mg total) by mouth daily. Start taking on: December 18, 2020 Replaces: Bayer Aspirin 325 MG EC tablet   docusate sodium 100 MG capsule Commonly known as: COLACE Take 1 capsule (100 mg  total) by mouth 2 (two) times daily as needed for mild constipation.   folic acid 1 MG tablet Commonly known as: FOLVITE Take 1 tablet (1 mg total) by mouth daily. Start taking on: December 18, 2020   hydrALAZINE 20 MG/ML injection Commonly known as: APRESOLINE Inject 1 mL (20 mg total) into the vein every 6 (six) hours as needed (for SBP > 160).   insulin aspart 100 UNIT/ML injection Commonly known as: novoLOG Inject 0-9 Units into the skin every 4 (four) hours.   LORazepam 1 MG tablet Commonly known as: ATIVAN Take 1-4 tablets (1-4 mg total) by mouth every hour as needed (withdrawal symptoms:  anxiety, agitation, insomnia, diaphoresis, nausea, vomiting, tremors, tachycardia, or hypertension.).   multivitamin with minerals Tabs tablet Take 1 tablet by mouth daily. Start taking on: December 18, 2020   nicotine 7 mg/24hr patch Commonly known as: NICODERM CQ - dosed in mg/24 hr Place 1 patch (7 mg total) onto the skin daily. Start taking on: December 18, 2020   pantoprazole 40 MG tablet Commonly known as: PROTONIX Take 1 tablet (40 mg total) by mouth at bedtime.   polyethylene glycol 17 g packet Commonly known as: MIRALAX / GLYCOLAX Take 17 g by mouth daily as needed for moderate constipation.   senna-docusate 8.6-50 MG tablet Commonly known as: Senokot-S Take 1 tablet by mouth at bedtime as needed for mild constipation or  moderate constipation.   simvastatin 40 MG tablet Commonly known as: ZOCOR Take 1 tablet (40 mg total) by mouth daily at 6 PM.   thiamine 100 MG tablet Take 1 tablet (100 mg total) by mouth daily. Start taking on: December 18, 2020   ticagrelor 90 MG Tabs tablet Commonly known as: BRILINTA Take 1 tablet (90 mg total) by mouth 2 (two) times daily.      LABORATORY STUDIES CBC    Component Value Date/Time   WBC 9.7 12/15/2020 0235   RBC 4.65 12/15/2020 0235   HGB 12.2 (L) 12/15/2020 0235   HCT 37.0 (L) 12/15/2020 0235   PLT 197 12/15/2020 0235   MCV 79.6  (L) 12/15/2020 0235   MCH 26.2 12/15/2020 0235   MCHC 33.0 12/15/2020 0235   RDW 15.1 12/15/2020 0235   LYMPHSABS 3.0 12/13/2020 0151   MONOABS 0.5 12/13/2020 0151   EOSABS 0.1 12/13/2020 0151   BASOSABS 0.1 12/13/2020 0151   CMP    Component Value Date/Time   NA 136 12/16/2020 0411   K 3.9 12/16/2020 0411   CL 105 12/16/2020 0411   CO2 19 (L) 12/16/2020 0411   GLUCOSE 108 (H) 12/16/2020 0411   BUN 8 12/16/2020 0411   CREATININE 1.04 12/16/2020 0411   CALCIUM 8.9 12/16/2020 0411   PROT 7.2 12/13/2020 0151   ALBUMIN 4.0 12/13/2020 0151   AST 22 12/13/2020 0151   ALT 16 12/13/2020 0151   ALKPHOS 75 12/13/2020 0151   BILITOT 0.8 12/13/2020 0151   GFRNONAA >60 12/16/2020 0411   COAGS Lab Results  Component Value Date   INR 0.9 12/13/2020   Lipid Panel    Component Value Date/Time   CHOL 179 12/13/2020 0555   TRIG 81 12/13/2020 0555   HDL 54 12/13/2020 0555   CHOLHDL 3.3 12/13/2020 0555   VLDL 16 12/13/2020 0555   LDLCALC 109 (H) 12/13/2020 0555   HgbA1C  Lab Results  Component Value Date   HGBA1C 6.8 (H) 12/13/2020   Urinalysis    Component Value Date/Time   COLORURINE STRAW (A) 12/13/2020 1245   APPEARANCEUR CLEAR 12/13/2020 1245   LABSPEC 1.019 12/13/2020 1245   PHURINE 7.0 12/13/2020 1245   GLUCOSEU NEGATIVE 12/13/2020 1245   HGBUR NEGATIVE 12/13/2020 1245   [place] 12/13/2020 1245   KETONESUR NEGATIVE 12/13/2020 1245   PROTEINUR NEGATIVE 12/13/2020 1245   NITRITE NEGATIVE 12/13/2020 1245   LEUKOCYTESUR NEGATIVE 12/13/2020 1245   Urine Drug Screen     Component Value Date/Time   LABOPIA NONE DETECTED 12/13/2020 1245   COCAINSCRNUR POSITIVE (A) 12/13/2020 1245   LABBENZ NONE DETECTED 12/13/2020 1245   AMPHETMU NONE DETECTED 12/13/2020 1245   THCU POSITIVE (A) 12/13/2020 1245   LABBARB NONE DETECTED 12/13/2020 1245    Alcohol Level    Component Value Date/Time   ETH <10 12/13/2020 0541   SIGNIFICANT DIAGNOSTIC STUDIES CT Code  Stroke CTA Head W/WO contrast  Result Date: 12/13/2020 CLINICAL DATA:  Acute presentation with left MCA syndrome. EXAM: CT ANGIOGRAPHY HEAD AND NECK CT PERFUSION BRAIN TECHNIQUE: Multidetector CT imaging of the head and neck was performed using the standard protocol during bolus administration of intravenous contrast. Multiplanar CT image reconstructions and MIPs were obtained to evaluate the vascular anatomy. Carotid stenosis measurements (when applicable) are obtained utilizing NASCET criteria, using the distal internal carotid diameter as the denominator. Multiphase CT imaging of the brain was performed following IV bolus contrast injection. Subsequent parametric perfusion maps were calculated using RAPID software. CONTRAST:  166m OMNIPAQUE IOHEXOL 350 MG/ML SOLN COMPARISON:  Head CT earlier tonight. FINDINGS: CTA NECK FINDINGS Aortic arch: Aortic atherosclerotic calcification. Branching pattern is normal without origin stenosis. Right carotid system: Common carotid artery widely patent to the bifurcation. Soft and calcified plaque at the carotid bifurcation and ICA bulb. Minimal diameter at distal bulb is 1 mm or less, with a pronounced web-like stenosis. Flow is present distal to that. This is consistent with an 80% or greater stenosis. Left carotid system: Common carotid artery is patent to the bifurcation region. There is occlusion of the ICA at the origin. ECA is widely patent. No flow seen in the left internal carotid artery in till the siphon region, where there is reconstitution, probably from a combination of external internal collaterals and flow through a patent anterior and posterior communicating artery. Vertebral arteries: Atherosclerotic plaque at the right vertebral artery origin with 30-50% stenosis. The vessel is widely patent beyond that through the cervical region. Atherosclerotic plaque at the left vertebral artery origin with a 30-50% stenosis. Vessel is widely patent beyond that through  the cervical region. Skeleton: Ordinary cervical spondylosis. Other neck: No mass or lymphadenopathy. Upper chest: Normal Review of the MIP images confirms the above findings CTA HEAD FINDINGS Anterior circulation: Right internal carotid artery is patent through the siphon region without stenosis. Right anterior and middle cerebral vessels appear patent and normal. As noted above, left internal carotid artery reconstitutes in the siphon region probably from a combination of external to internal collaterals and patent communicating arteries. Flow is present in the anterior and middle cerebral vessels. I cannot clearly identify an occluded distal MCA branch. Posterior circulation: Both vertebral arteries are patent through the foramen magnum to the basilar. Some atherosclerotic narrowing and irregularity of the V4 segments, right more than left. No basilar stenosis. Posterior circulation branch vessels show flow. Venous sinuses: Patent and normal. Anatomic variants: None significant. Review of the MIP images confirms the above findings CT Brain Perfusion Findings: ASPECTS: In retrospect, probably 9, with some gray-white differentiation loss in the M4 region. CBF (<30%) Volume: 145mPerfusion (Tmax>6.0s) volume: 5844mismatch Volume: 57m54mfarction Location:Left frontoparietal junction region. 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 Reconstitution in the siphon probably from a combination of external to internal collaterals and patent communicating arteries. I cannot clearly identify the occluded MCA branch vessel, though there must be one, probably in the M3 region. 16 cc completed infarction in the left frontoparietal junction region. Additional 42 cc at risk brain surrounding that region. 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 30-50 % stenoses of both vertebral artery origins. Results discussed with Dr. BhagCurly Shoresapproximately 0220 hours  Electronically Signed   By: MarkNelson Chimes.   On: 12/13/2020 02:36   CT HEAD WO CONTRAST  Result Date: 12/15/2020 CLINICAL DATA:  Head trauma, minor, normal mental status. Additional history provided fall while getting out of bed, large right frontal scalp hematoma. EXAM: CT HEAD WITHOUT CONTRAST TECHNIQUE: Contiguous axial images were obtained from the base of the skull through the vertex without intravenous contrast. COMPARISON:  MRI/MRA head 12/13/2020. Report from thrombectomy 12/13/2020. noncontrast head CT 12/05/2020. CT angiogram head/neck 12/13/2020. FINDINGS: Brain: Cerebral volume is normal. Redemonstrated acute/early subacute cortical and subcortical left MCA territory infarct within the left frontal and parietal lobes as well as left insula. Mild curvilinear hyperdensity within the left frontoparietal operculum infarction territory, which was not definitively present on the post  procedural CT of 12/13/2020. No acute intracranial hemorrhage is identified elsewhere. The left MCA territory infarct has not appreciably increased in extent. No significant mass effect. No midline shift. Vascular: Atherosclerotic calcifications. Skull: Normal. Negative for fracture or focal lesion. Sinuses/Orbits: No significant paranasal sinus disease at the imaged levels. Other: Trace fluid within the right mastoid air cells. Prominent right anterior scalp/forehead hematoma. These results will be called to the ordering clinician or representative by the Radiologist Assistant, and communication documented in the PACS or [employer]. IMPRESSION: Redemonstrated moderate to large acute/early subacute cortical and subcortical left MCA territory infarct. Mild curvilinear hyperdensity is present within the left frontoparietal operculum infarction territory, not definitively present on the prior postprocedural head CT of 12/13/2020. This may reflect petechial hemorrhage. Alternatively, this may reflect residual thrombus  within a left M2/M3 vessel. No acute intracranial hemorrhage is identified elsewhere. Prominent right anterior scalp/forehead hematoma. Electronically Signed   By: Kellie Simmering DO   On: 12/15/2020 18:42   CT Code Stroke CTA Neck W/WO contrast  Result Date: 12/13/2020 CLINICAL DATA:  Acute presentation with left MCA syndrome. EXAM: CT ANGIOGRAPHY HEAD AND NECK CT PERFUSION BRAIN TECHNIQUE: Multidetector CT imaging of the head and neck was performed using the standard protocol during bolus administration of intravenous contrast. Multiplanar CT image reconstructions and MIPs were obtained to evaluate the vascular anatomy. Carotid stenosis measurements (when applicable) are obtained utilizing NASCET criteria, using the distal internal carotid diameter as the denominator. Multiphase CT imaging of the brain was performed following IV bolus contrast injection. Subsequent parametric perfusion maps were calculated using RAPID software. CONTRAST:  138m OMNIPAQUE IOHEXOL 350 MG/ML SOLN COMPARISON:  Head CT earlier tonight. FINDINGS: CTA NECK FINDINGS Aortic arch: Aortic atherosclerotic calcification. Branching pattern is normal without origin stenosis. Right carotid system: Common carotid artery widely patent to the bifurcation. Soft and calcified plaque at the carotid bifurcation and ICA bulb. Minimal diameter at distal bulb is 1 mm or less, with a pronounced web-like stenosis. Flow is present distal to that. This is consistent with an 80% or greater stenosis. Left carotid system: Common carotid artery is patent to the bifurcation region. There is occlusion of the ICA at the origin. ECA is widely patent. No flow seen in the left internal carotid artery in till the siphon region, where there is reconstitution, probably from a combination of external internal collaterals and flow through a patent anterior and posterior communicating artery. Vertebral arteries: Atherosclerotic plaque at the right vertebral artery origin  with 30-50% stenosis. The vessel is widely patent beyond that through the cervical region. Atherosclerotic plaque at the left vertebral artery origin with a 30-50% stenosis. Vessel is widely patent beyond that through the cervical region. Skeleton: Ordinary cervical spondylosis. Other neck: No mass or lymphadenopathy. Upper chest: Normal Review of the MIP images confirms the above findings CTA HEAD FINDINGS Anterior circulation: Right internal carotid artery is patent through the siphon region without stenosis. Right anterior and middle cerebral vessels appear patent and normal. As noted above, left internal carotid artery reconstitutes in the siphon region probably from a combination of external to internal collaterals and patent communicating arteries. Flow is present in the anterior and middle cerebral vessels. I cannot clearly identify an occluded distal MCA branch. Posterior circulation: Both vertebral arteries are patent through the foramen magnum to the basilar. Some atherosclerotic narrowing and irregularity of the V4 segments, right more than left. No basilar stenosis. Posterior circulation branch vessels show flow. Venous sinuses: Patent and normal. Anatomic variants:  None significant. Review of the MIP images confirms the above findings CT Brain Perfusion Findings: ASPECTS: In retrospect, probably 9, with some gray-white differentiation loss in the M4 region. CBF (<30%) Volume: 74m Perfusion (Tmax>6.0s) volume: 54mMismatch Volume: 42100mnfarction Location:Left frontoparietal junction region. 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 Reconstitution in the siphon probably from a combination of external to internal collaterals and patent communicating arteries. I cannot clearly identify the occluded MCA branch vessel, though there must be one, probably in the M3 region. 16 cc completed infarction in the left frontoparietal junction region. Additional 42 cc at risk brain surrounding that region.  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 30-50 % stenoses of both vertebral artery origins. Results discussed with Dr. BhaCurly [place]ores approximately 0220 hours Electronically Signed   By: MarNelson ChimesD.   On: 12/13/2020 02:36   MR ANGIO HEAD WO CONTRAST  Result Date: 12/13/2020 CLINICAL DATA:  Stroke. Post left internal carotid artery thrombectomy and stenting of left carotid bifurcation. EXAM: MRI HEAD WITHOUT CONTRAST MRA HEAD WITHOUT CONTRAST TECHNIQUE: Multiplanar, multiecho pulse sequences of the brain and surrounding structures were obtained without intravenous contrast. Angiographic images of the head were obtained using MRA technique without contrast. COMPARISON:  CT angio head and neck and CT perfusion 12/13/2020 FINDINGS: MRI HEAD FINDINGS Brain: Acute infarct in the left parietal lobe over the convexity. This extends anteriorly into the operculum and posterior insula. Small areas of acute infarct in the left frontal white matter and cortex and in the right frontal white matter. No associated hemorrhage. Ventricle size normal.  No mass or midline shift. Vascular: Normal arterial flow voids. Skull and upper cervical spine: No focal lesion. Sinuses/Orbits: Paranasal sinuses clear.  Negative orbit Other: Motion degraded study. MRA HEAD FINDINGS Both vertebral arteries patent to the basilar. PICA patent bilaterally. Basilar widely patent. Superior cerebellar arteries patent bilaterally. Left posterior cerebral artery widely patent. Patent left posterior communicating artery. Fetal origin of the right posterior cerebral artery. Decreased signal in the right P1 and P2 segment. This vessel appeared patent on CTA earlier today. Question artifact versus thrombus. Internal carotid artery patent bilaterally without stenosis. Focal moderate stenosis left M1 segment. There is good distal flow and right middle cerebral artery branches are patent. Anterior  cerebral arteries patent bilaterally. Moderate stenosis right middle cerebral artery. IMPRESSION: 1. Acute infarct left posterior MCA distribution involving the posterior insula in the left frontal parietal cortex. Small areas of acute infarct in the frontal lobes bilaterally. 2. Motion degraded study. 3. MRA demonstrates loss of signal in the right posterior cerebral artery. This vessel was patent on CTA earlier today. Possible artifact versus thrombus. 4. There is moderate stenosis in the middle cerebral artery M1 segment bilaterally. Left internal carotid artery appears widely patent. Electronically Signed   By: ChaFranchot GalloD.   On: 12/13/2020 19:17   MR BRAIN WO CONTRAST  Result Date: 12/13/2020 CLINICAL DATA:  Stroke. Post left internal carotid artery thrombectomy and stenting of left carotid bifurcation. EXAM: MRI HEAD WITHOUT CONTRAST MRA HEAD WITHOUT CONTRAST TECHNIQUE: Multiplanar, multiecho pulse sequences of the brain and surrounding structures were obtained without intravenous contrast. Angiographic images of the head were obtained using MRA technique without contrast. COMPARISON:  CT angio head and neck and CT perfusion 12/13/2020 FINDINGS: MRI HEAD FINDINGS Brain: Acute infarct in the left parietal lobe over the convexity. This extends anteriorly into the operculum and posterior insula. Small  areas of acute infarct in the left frontal white matter and cortex and in the right frontal white matter. No associated hemorrhage. Ventricle size normal.  No mass or midline shift. Vascular: Normal arterial flow voids. Skull and upper cervical spine: No focal lesion. Sinuses/Orbits: Paranasal sinuses clear.  Negative orbit Other: Motion degraded study. MRA HEAD FINDINGS Both vertebral arteries patent to the basilar. PICA patent bilaterally. Basilar widely patent. Superior cerebellar arteries patent bilaterally. Left posterior cerebral artery widely patent. Patent left posterior communicating artery.  Fetal origin of the right posterior cerebral artery. Decreased signal in the right P1 and P2 segment. This vessel appeared patent on CTA earlier today. Question artifact versus thrombus. Internal carotid artery patent bilaterally without stenosis. Focal moderate stenosis left M1 segment. There is good distal flow and right middle cerebral artery branches are patent. Anterior cerebral arteries patent bilaterally. Moderate stenosis right middle cerebral artery. IMPRESSION: 1. Acute infarct left posterior MCA distribution involving the posterior insula in the left frontal parietal cortex. Small areas of acute infarct in the frontal lobes bilaterally. 2. Motion degraded study. 3. MRA demonstrates loss of signal in the right posterior cerebral artery. This vessel was patent on CTA earlier today. Possible artifact versus thrombus. 4. There is moderate stenosis in the middle cerebral artery M1 segment bilaterally. Left internal carotid artery appears widely patent. Electronically Signed   By: Franchot Gallo M.D.   On: 12/13/2020 19:17   IR INTRAVSC STENT CERV CAROTID W/EMB-PROT MOD SED  Result Date: 12/14/2020 INDICATION: 65 year old male with past medical history significant for tobacco use, alcohol and possible drug abuse. Patient presented severe aphasia and right-sided weakness. Initial NIHSS 22 later improving to 10. Head CT showed hypodensity in the left posterior frontal region (aspects 9) CT/CT angiogram of the head and neck showed a left ICA occlusion at the bulb with intracranial reconstitution in a left M3/MCA branch occlusion. Additionally patient head high-grade stenosis of the cervical right ICA and mild stenosis at the origin of the bilateral vertebral arteries. CT perfusion showed a core infarct of 16 mL with a 58 mL penumbra. He was then transferred to our service for a diagnostic cerebral angiogram with mechanical thrombectomy. EXAM: Ultrasound-guided vascular access Diagnostic cerebral angiogram  Mechanical thrombectomy Flat panel head CT Left carotid stenting with cerebral protection device COMPARISON:  CT/CT angiogram of December 13, 2020 MEDICATIONS: IV cangrelor utilized prior to stent deployment ANESTHESIA/SEDATION: The procedure was performed in the general anesthesia. CONTRAST:  95 mL of Omnipaque 240 milligram/mL FLUOROSCOPY TIME:  Fluoroscopy Time: 38 minutes 54 seconds (970 mGy). COMPLICATIONS: None immediate. TECHNIQUE: Informed written consent was not obtained given patient's severe aphasia and no reachable healthcare proxy or next of kin available. Maximal Sterile Barrier Technique was utilized including caps, mask, sterile gowns, sterile gloves, sterile drape, hand hygiene and skin antiseptic. A timeout was performed prior to the initiation of the procedure. The right groin was prepped and draped in the usual sterile fashion. Using a micropuncture kit and the modified Seldinger technique, access was gained to the right common femoral artery and an 8 French sheath was placed. Real-time ultrasound guidance was utilized for vascular access including the acquisition of a permanent ultrasound image documenting patency of the accessed vessel. Under fluoroscopy, an 8 [place] Walrus balloon guide catheter was navigated over a 6 [place] Berenstein 2 catheter and a 0.035" Terumo Glidewire into the aortic arch. The catheter was placed into the left common carotid artery. Frontal and lateral angiograms of the neck were obtained. Under  biplane roadmap, a zoom 71 aspiration catheter was navigated over a phenom 21 microcatheter and a Aristotle 14 microguidewire into the cavernous segment of the left ICA. The balloon guide catheter was advanced into the upper cervical segment of the left ICA. Frontal and lateral angiograms of the head were obtained via aspiration catheter contrast injection. FINDINGS: 1. Occlusion of the left ICA in the neck at the level of the bulb. 2. Occlusion of a proximal left M2/MCA middle  division branch. 3. Nonocclusive filling defect within a left A4/ACA branch. 4. Patent right common femoral artery. PROCEDURE: Magnified frontal and lateral angiograms of the head were obtained in used as biplane roadmap. The microcatheter was then navigated over the wire into the left M3/MCA middle division branch. Then, a 4 x 40 mm solitaire stent retriever was deployed spanning the M2 and proximal M3 segment. The device was allowed to intercalated with the clot for 4 minutes. The microcatheter was removed. The aspiration catheter was advanced to the level of occlusion and connected to a penumbra aspiration pump. The guiding catheter balloon was inflated. The thrombectomy device and aspiration catheter were removed under constant aspiration. Follow-up left ICA angiogram showed complete recanalization the left MCA vascular tree. The guiding catheter was then retracted into the left common carotid artery. Frontal and lateral angiograms of the neck were obtained. Patent left ICA noted at the bulb with significant luminal irregularity and filling defect. Left common carotid artery angiograms with frontal and lateral of the head showed for opacification of the left MCA branches which remain open. Flat panel CT of the head was obtained and post processed in a separate workstation with concurrent attending physician supervision. Selected images were sent to PACS. No evidence of hemorrhagic complication noted. Follow-up left common carotid artery angiogram showed worsening of the left ICA stenosis at the bulb with near occlusion and slow flow distally. Patient was loaded on cangrelor in anticipation to stent the placement. Under biplane roadmap, a 4-7 Emboshield NAV6 cerebral protection device was navigated into the distal cervical segment of the left ICA. Subsequently, a 9-7 x 40 mm XACT carotid stent was deployed across the area of stenosis in the left ICA. Then, in stent stenosis was performed with a 4 x 30 mm Viatrac  balloon. The cerebral protection device was recaptured. Follow-up left ICA angiogram showed good position of the stent with significantly improved anterograde flow. Left ICA angiograms with frontal and lateral views of the head showed no evidence of thromboembolic complication with significant improvement of the anterograde flow. Delayed left ICA angiogram showed no evidence of in stent clot formation. Delayed cranial angiograms appear stable. The system was subsequently withdrawn. Left common femoral artery angiograms were obtained with frontal and lateral views. The puncture is at the level of the common femoral artery which shows mild atherosclerotic change with adequate caliber for closure device utilization. The femoral sheath thus exchanged for a Perclose ProGlide which was utilized for access closure. Immediate hemostasis was achieved. IMPRESSION: 1. Successful mechanical thrombectomy for treatment of a left M2/MCA occlusion with a single pass of combined aspiration plus stent retriever achieving complete recanalization (TICI3). 2. Stenting and angioplasty of left ICA severe stenosis with cerebral protection device. PLAN: Patient is to continue on cangrelor drip until follow-up imaging. Then, transition to dual anti-platelet therapy with aspirin and Brilinta. Electronically Signed   By: [name] M.D.   On: 12/14/2020 12:36   IR CT Head Ltd  Result Date: 12/14/2020 INDICATION: 65 year old male with  past medical history significant for tobacco use, alcohol and possible drug abuse. Patient presented severe aphasia and right-sided weakness. Initial NIHSS 22 later improving to 10. Head CT showed hypodensity in the left posterior frontal region (aspects 9) CT/CT angiogram of the head and neck showed a left ICA occlusion at the bulb with intracranial reconstitution in a left M3/MCA branch occlusion. Additionally patient head high-grade stenosis of the cervical right ICA and mild stenosis at  the origin of the bilateral vertebral arteries. CT perfusion showed a core infarct of 16 mL with a 58 mL penumbra. He was then transferred to our service for a diagnostic cerebral angiogram with mechanical thrombectomy. EXAM: Ultrasound-guided vascular access Diagnostic cerebral angiogram Mechanical thrombectomy Flat panel head CT Left carotid stenting with cerebral protection device COMPARISON:  CT/CT angiogram of December 13, 2020 MEDICATIONS: IV cangrelor utilized prior to stent deployment ANESTHESIA/SEDATION: The procedure was performed in the general anesthesia. CONTRAST:  95 mL of Omnipaque 240 milligram/mL FLUOROSCOPY TIME:  Fluoroscopy Time: 38 minutes 54 seconds (970 mGy). COMPLICATIONS: None immediate. TECHNIQUE: Informed written consent was not obtained given patient's severe aphasia and no reachable healthcare proxy or next of kin available. Maximal Sterile Barrier Technique was utilized including caps, mask, sterile gowns, sterile gloves, sterile drape, hand hygiene and skin antiseptic. A timeout was performed prior to the initiation of the procedure. The right groin was prepped and draped in the usual sterile fashion. Using a micropuncture kit and the modified Seldinger technique, access was gained to the right common femoral artery and an 8 French sheath was placed. Real-time ultrasound guidance was utilized for vascular access including the acquisition of a permanent ultrasound image documenting patency of the accessed vessel. Under fluoroscopy, an 8 [place] Walrus balloon guide catheter was navigated over a 6 [place] Berenstein 2 catheter and a 0.035" Terumo Glidewire into the aortic arch. The catheter was placed into the left common carotid artery. Frontal and lateral angiograms of the neck were obtained. Under biplane roadmap, a zoom 71 aspiration catheter was navigated over a phenom 21 microcatheter and a Aristotle 14 microguidewire into the cavernous segment of the left ICA. The balloon guide  catheter was advanced into the upper cervical segment of the left ICA. Frontal and lateral angiograms of the head were obtained via aspiration catheter contrast injection. FINDINGS: 1. Occlusion of the left ICA in the neck at the level of the bulb. 2. Occlusion of a proximal left M2/MCA middle division branch. 3. Nonocclusive filling defect within a left A4/ACA branch. 4. Patent right common femoral artery. PROCEDURE: Magnified frontal and lateral angiograms of the head were obtained in used as biplane roadmap. The microcatheter was then navigated over the wire into the left M3/MCA middle division branch. Then, a 4 x 40 mm solitaire stent retriever was deployed spanning the M2 and proximal M3 segment. The device was allowed to intercalated with the clot for 4 minutes. The microcatheter was removed. The aspiration catheter was advanced to the level of occlusion and connected to a penumbra aspiration pump. The guiding catheter balloon was inflated. The thrombectomy device and aspiration catheter were removed under constant aspiration. Follow-up left ICA angiogram showed complete recanalization the left MCA vascular tree. The guiding catheter was then retracted into the left common carotid artery. Frontal and lateral angiograms of the neck were obtained. Patent left ICA noted at the bulb with significant luminal irregularity and filling defect. Left common carotid artery angiograms with frontal and lateral of the head showed for opacification of the left  MCA branches which remain open. Flat panel CT of the head was obtained and post processed in a separate workstation with concurrent attending physician supervision. Selected images were sent to PACS. No evidence of hemorrhagic complication noted. Follow-up left common carotid artery angiogram showed worsening of the left ICA stenosis at the bulb with near occlusion and slow flow distally. Patient was loaded on cangrelor in anticipation to stent the placement. Under  biplane roadmap, a 4-7 Emboshield NAV6 cerebral protection device was navigated into the distal cervical segment of the left ICA. Subsequently, a 9-7 x 40 mm XACT carotid stent was deployed across the area of stenosis in the left ICA. Then, in stent stenosis was performed with a 4 x 30 mm Viatrac balloon. The cerebral protection device was recaptured. Follow-up left ICA angiogram showed good position of the stent with significantly improved anterograde flow. Left ICA angiograms with frontal and lateral views of the head showed no evidence of thromboembolic complication with significant improvement of the anterograde flow. Delayed left ICA angiogram showed no evidence of in stent clot formation. Delayed cranial angiograms appear stable. The system was subsequently withdrawn. Left common femoral artery angiograms were obtained with frontal and lateral views. The puncture is at the level of the common femoral artery which shows mild atherosclerotic change with adequate caliber for closure device utilization. The femoral sheath thus exchanged for a Perclose ProGlide which was utilized for access closure. Immediate hemostasis was achieved. IMPRESSION: 1. Successful mechanical thrombectomy for treatment of a left M2/MCA occlusion with a single pass of combined aspiration plus stent retriever achieving complete recanalization (TICI3). 2. Stenting and angioplasty of left ICA severe stenosis with cerebral protection device. PLAN: Patient is to continue on cangrelor drip until follow-up imaging. Then, transition to dual anti-platelet therapy with aspirin and Brilinta. Electronically Signed   By: [name] M.D.   On: 12/14/2020 12:36   IR US Guide Vasc Access Right  Result Date: 12/14/2020 INDICATION: 65 year old male with past medical history significant for tobacco use, alcohol and possible drug abuse. Patient presented severe aphasia and right-sided weakness. Initial NIHSS 22 later improving to 10.  Head CT showed hypodensity in the left posterior frontal region (aspects 9) CT/CT angiogram of the head and neck showed a left ICA occlusion at the bulb with intracranial reconstitution in a left M3/MCA branch occlusion. Additionally patient head high-grade stenosis of the cervical right ICA and mild stenosis at the origin of the bilateral vertebral arteries. CT perfusion showed a core infarct of 16 mL with a 58 mL penumbra. He was then transferred to our service for a diagnostic cerebral angiogram with mechanical thrombectomy. EXAM: Ultrasound-guided vascular access Diagnostic cerebral angiogram Mechanical thrombectomy Flat panel head CT Left carotid stenting with cerebral protection device COMPARISON:  CT/CT angiogram of December 13, 2020 MEDICATIONS: IV cangrelor utilized prior to stent deployment ANESTHESIA/SEDATION: The procedure was performed in the general anesthesia. CONTRAST:  95 mL of Omnipaque 240 milligram/mL FLUOROSCOPY TIME:  Fluoroscopy Time: 38 minutes 54 seconds (970 mGy). COMPLICATIONS: None immediate. TECHNIQUE: Informed written consent was not obtained given patient's severe aphasia and no reachable healthcare proxy or next of kin available. Maximal Sterile Barrier Technique was utilized including caps, mask, sterile gowns, sterile gloves, sterile drape, hand hygiene and skin antiseptic. A timeout was performed prior to the initiation of the procedure. The right groin was prepped and draped in the usual sterile fashion. Using a micropuncture kit and the modified Seldinger technique, access was gained to the right common  femoral artery and an 8 French sheath was placed. Real-time ultrasound guidance was utilized for vascular access including the acquisition of a permanent ultrasound image documenting patency of the accessed vessel. Under fluoroscopy, an 8 [place] Walrus balloon guide catheter was navigated over a 6 [place] Berenstein 2 catheter and a 0.035" Terumo Glidewire into the aortic arch. The  catheter was placed into the left common carotid artery. Frontal and lateral angiograms of the neck were obtained. Under biplane roadmap, a zoom 71 aspiration catheter was navigated over a phenom 21 microcatheter and a Aristotle 14 microguidewire into the cavernous segment of the left ICA. The balloon guide catheter was advanced into the upper cervical segment of the left ICA. Frontal and lateral angiograms of the head were obtained via aspiration catheter contrast injection. FINDINGS: 1. Occlusion of the left ICA in the neck at the level of the bulb. 2. Occlusion of a proximal left M2/MCA middle division branch. 3. Nonocclusive filling defect within a left A4/ACA branch. 4. Patent right common femoral artery. PROCEDURE: Magnified frontal and lateral angiograms of the head were obtained in used as biplane roadmap. The microcatheter was then navigated over the wire into the left M3/MCA middle division branch. Then, a 4 x 40 mm solitaire stent retriever was deployed spanning the M2 and proximal M3 segment. The device was allowed to intercalated with the clot for 4 minutes. The microcatheter was removed. The aspiration catheter was advanced to the level of occlusion and connected to a penumbra aspiration pump. The guiding catheter balloon was inflated. The thrombectomy device and aspiration catheter were removed under constant aspiration. Follow-up left ICA angiogram showed complete recanalization the left MCA vascular tree. The guiding catheter was then retracted into the left common carotid artery. Frontal and lateral angiograms of the neck were obtained. Patent left ICA noted at the bulb with significant luminal irregularity and filling defect. Left common carotid artery angiograms with frontal and lateral of the head showed for opacification of the left MCA branches which remain open. Flat panel CT of the head was obtained and post processed in a separate workstation with concurrent attending physician supervision.  Selected images were sent to PACS. No evidence of hemorrhagic complication noted. Follow-up left common carotid artery angiogram showed worsening of the left ICA stenosis at the bulb with near occlusion and slow flow distally. Patient was loaded on cangrelor in anticipation to stent the placement. Under biplane roadmap, a 4-7 Emboshield NAV6 cerebral protection device was navigated into the distal cervical segment of the left ICA. Subsequently, a 9-7 x 40 mm XACT carotid stent was deployed across the area of stenosis in the left ICA. Then, in stent stenosis was performed with a 4 x 30 mm Viatrac balloon. The cerebral protection device was recaptured. Follow-up left ICA angiogram showed good position of the stent with significantly improved anterograde flow. Left ICA angiograms with frontal and lateral views of the head showed no evidence of thromboembolic complication with significant improvement of the anterograde flow. Delayed left ICA angiogram showed no evidence of in stent clot formation. Delayed cranial angiograms appear stable. The system was subsequently withdrawn. Left common femoral artery angiograms were obtained with frontal and lateral views. The puncture is at the level of the common femoral artery which shows mild atherosclerotic change with adequate caliber for closure device utilization. The femoral sheath thus exchanged for a Perclose ProGlide which was utilized for access closure. Immediate hemostasis was achieved. IMPRESSION: 1. Successful mechanical thrombectomy for treatment of a left M2/MCA occlusion  with a single pass of combined aspiration plus stent retriever achieving complete recanalization (TICI3). 2. Stenting and angioplasty of left ICA severe stenosis with cerebral protection device. PLAN: Patient is to continue on cangrelor drip until follow-up imaging. Then, transition to dual anti-platelet therapy with aspirin and Brilinta. Electronically Signed   By: [name]  M.D.   On: 12/14/2020 12:36   CT Code Stroke Cerebral Perfusion with contrast  Result Date: 12/13/2020 CLINICAL DATA:  Acute presentation with left MCA syndrome. EXAM: CT ANGIOGRAPHY HEAD AND NECK CT PERFUSION BRAIN TECHNIQUE: Multidetector CT imaging of the head and neck was performed using the standard protocol during bolus administration of intravenous contrast. Multiplanar CT image reconstructions and MIPs were obtained to evaluate the vascular anatomy. Carotid stenosis measurements (when applicable) are obtained utilizing NASCET criteria, using the distal internal carotid diameter as the denominator. Multiphase CT imaging of the brain was performed following IV bolus contrast injection. Subsequent parametric perfusion maps were calculated using RAPID software. CONTRAST:  120m OMNIPAQUE IOHEXOL 350 MG/ML SOLN COMPARISON:  Head CT earlier tonight. FINDINGS: CTA NECK FINDINGS Aortic arch: Aortic atherosclerotic calcification. Branching pattern is normal without origin stenosis. Right carotid system: Common carotid artery widely patent to the bifurcation. Soft and calcified plaque at the carotid bifurcation and ICA bulb. Minimal diameter at distal bulb is 1 mm or less, with a pronounced web-like stenosis. Flow is present distal to that. This is consistent with an 80% or greater stenosis. Left carotid system: Common carotid artery is patent to the bifurcation region. There is occlusion of the ICA at the origin. ECA is widely patent. No flow seen in the left internal carotid artery in till the siphon region, where there is reconstitution, probably from a combination of external internal collaterals and flow through a patent anterior and posterior communicating artery. Vertebral arteries: Atherosclerotic plaque at the right vertebral artery origin with 30-50% stenosis. The vessel is widely patent beyond that through the cervical region. Atherosclerotic plaque at the left vertebral artery origin with a 30-50%  stenosis. Vessel is widely patent beyond that through the cervical region. Skeleton: Ordinary cervical spondylosis. Other neck: No mass or lymphadenopathy. Upper chest: Normal Review of the MIP images confirms the above findings CTA HEAD FINDINGS Anterior circulation: Right internal carotid artery is patent through the siphon region without stenosis. Right anterior and middle cerebral vessels appear patent and normal. As noted above, left internal carotid artery reconstitutes in the siphon region probably from a combination of external to internal collaterals and patent communicating arteries. Flow is present in the anterior and middle cerebral vessels. I cannot clearly identify an occluded distal MCA branch. Posterior circulation: Both vertebral arteries are patent through the foramen magnum to the basilar. Some atherosclerotic narrowing and irregularity of the V4 segments, right more than left. No basilar stenosis. Posterior circulation branch vessels show flow. Venous sinuses: Patent and normal. Anatomic variants: None significant. Review of the MIP images confirms the above findings CT Brain Perfusion Findings: ASPECTS: In retrospect, probably 9, with some gray-white differentiation loss in the M4 region. CBF (<30%) Volume: 124mPerfusion (Tmax>6.0s) volume: 587mismatch Volume: 72m25mfarction Location:Left frontoparietal junction region. 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 Reconstitution in the siphon probably from a combination of external to internal collaterals and patent communicating arteries. I cannot clearly identify the occluded MCA branch vessel, though there must be one, probably in the M3 region. 16 cc completed infarction in the left frontoparietal junction region. Additional 42 cc  at risk brain surrounding that region. 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 30-50 % stenoses of both vertebral artery origins. Results  discussed with Dr. Curly Shores at approximately 0220 hours Electronically Signed   By: Nelson Chimes M.D.   On: 12/13/2020 02:36   DG Chest Port 1 View  Result Date: 12/16/2020 CLINICAL DATA:  Abnormal respiration. EXAM: PORTABLE CHEST 1 VIEW COMPARISON:  May 12, 2019. FINDINGS: The heart size and mediastinal contours are within normal limits. Both lungs are clear. The visualized skeletal structures are unremarkable. IMPRESSION: No active disease. Electronically Signed   By: Marijo Conception M.D.   On: 12/16/2020 09:16   DG Swallowing Func-Speech Pathology  Result Date: 12/14/2020 Objective Swallowing Evaluation: Type of Study: MBS-Modified Barium Swallow Study  Patient Details Name: Dustin Golden MRN: [id number] Date of Birth: 1956-04-20 Today's Date: 12/14/2020 Time: SLP Start Time (ACUTE ONLY): 1315 -SLP Stop Time (ACUTE ONLY): 1335 SLP Time Calculation (min) (ACUTE ONLY): 20 min Past Medical History: No past medical history on file. Past Surgical History: Past Surgical History: Procedure Laterality Date . APPENDECTOMY   . gastric ulcer surgery   . IR CT HEAD LTD  12/13/2020 . IR INTRAVSC STENT CERV CAROTID W/EMB-PROT MOD SED INCL ANGIO  12/14/2020 . IR PERCUTANEOUS ART THROMBECTOMY/INFUSION INTRACRANIAL INC DIAG ANGIO  12/13/2020 . IR US GUIDE VASC ACCESS RIGHT  12/13/2020 . RADIOLOGY WITH ANESTHESIA N/A 12/13/2020  Procedure: IR WITH ANESTHESIA;  Surgeon: Luanne Bras, MD;  Location: [facility];  Service: Radiology;  Laterality: N/A; HPI: 65 y.o. male with past medical history significant for ongoing tobacco abuse, alcohol use and drug use (marijuana and possibly other substances) per EMS report per bystander report. 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 Small areas of acute infarct in the frontal lobes bilaterally. Pt s/p successful mechanical thrombectomy of the proximal left M2's/MCA middle division occlusion.  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 Right sided oral motor deficits from CVA allowed for frequent right sided anterior spillage, reduced bolus cohesion, right sided oral residuals, and premature spillage of bolus. Pharyngeally pt with reduced timing and efficiency of laryngeal vestibule closure with an instance of trace transient penetration of thins and one instance of trace silent aspiration of thins by cup on initial series. Subsequent trials of thins via cup and straw were without penetration or aspiration (including mixed consistency with barium tablet series). Mild vallecular and pyriform sinsus residuasl noted with POs, clearing with reflexive swallows. Recommend dysphagia 3 (mechanical soft) and thin liquids with meds in puree. SLP to follow up. SLP Visit Diagnosis Dysphagia, oropharyngeal phase (R13.12) Attention and concentration deficit following -- Frontal lobe and executive function deficit following -- Impact on safety and function Mild aspiration risk;Moderate aspiration risk   [facility] IP TREATMENT RECOMMENDATION 12/14/2020 Treatment Recommendations Therapy as outlined in treatment plan below   Prognosis 12/14/2020 Prognosis for Safe Diet Advancement Good Barriers to Reach Goals Time post onset Barriers/Prognosis Comment -- [facility] IP DIET RECOMMENDATION 12/14/2020 SLP Diet Recommendations Dysphagia 3 (Mech soft) solids;Thin liquid Liquid Administration via Cup;Straw Medication Administration Whole meds with puree Compensations Minimize environmental distractions;Slow rate;Small sips/bites;Lingual sweep for clearance of pocketing;Clear throat intermittently Postural Changes Remain semi-upright after after feeds/meals (Comment);Seated upright at 90 degrees  [facility] IP OTHER RECOMMENDATIONS 12/14/2020 Recommended Consults -- Oral Care Recommendations Oral  care BID Other Recommendations --   [facility] IP FOLLOW UP RECOMMENDATIONS 12/14/2020 Follow up Recommendations Inpatient Rehab   [facility] IP FREQUENCY AND DURATION 12/14/2020 Speech Therapy Frequency (ACUTE ONLY) min 2x/week Treatment Duration 2 weeks      [facility] IP ORAL PHASE 12/14/2020 Oral Phase Impaired Oral - Pudding Teaspoon -- Oral - Pudding Cup -- Oral - Honey Teaspoon -- Oral - Honey Cup -- Oral - Nectar Teaspoon -- Oral - Nectar Cup Right pocketing in lateral sulci;Lingual/palatal residue;Right anterior bolus loss Oral - Nectar Straw -- Oral - Thin Teaspoon -- Oral - Thin Cup Lingual/palatal residue;Right anterior bolus loss;Right pocketing in lateral sulci Oral - Thin Straw Right anterior bolus loss;Lingual/palatal residue Oral - Puree Lingual/palatal residue;Right pocketing in lateral sulci;Delayed oral transit Oral - Mech Soft Right pocketing in lateral sulci;Lingual/palatal residue;Decreased bolus cohesion;Delayed oral transit Oral - Regular -- Oral - Multi-Consistency -- Oral - Pill Lingual/palatal residue;Right anterior bolus loss;Delayed oral transit Oral Phase - Comment --  [facility] IP PHARYNGEAL PHASE 12/14/2020 Pharyngeal Phase Impaired Pharyngeal- Pudding Teaspoon -- Pharyngeal -- Pharyngeal- Pudding Cup -- Pharyngeal -- Pharyngeal- Honey Teaspoon -- Pharyngeal -- Pharyngeal- Honey Cup -- Pharyngeal -- Pharyngeal- Nectar Teaspoon -- Pharyngeal -- Pharyngeal- Nectar Cup Delayed swallow initiation-vallecula;Pharyngeal residue - pyriform;Pharyngeal residue - valleculae;Reduced laryngeal elevation;Reduced tongue base retraction Pharyngeal -- Pharyngeal- Nectar Straw -- Pharyngeal -- Pharyngeal- Thin Teaspoon -- Pharyngeal -- Pharyngeal- Thin Cup Reduced laryngeal elevation;Reduced tongue base retraction;Pharyngeal residue - valleculae;Pharyngeal residue - pyriform;Penetration/Aspiration before swallow;Penetration/Aspiration during swallow;Reduced epiglottic inversion;Reduced airway/laryngeal closure;Delayed swallow  initiation-pyriform sinuses Pharyngeal Material enters airway, passes BELOW cords without attempt by patient to eject out (silent aspiration);Material enters airway, remains ABOVE vocal cords and not ejected out;Material does not enter airway Pharyngeal- Thin Straw Pharyngeal residue - valleculae;Pharyngeal residue - pyriform;Delayed swallow initiation-pyriform sinuses;Reduced tongue base retraction;Reduced laryngeal elevation Pharyngeal -- Pharyngeal- Puree Reduced tongue base retraction;Reduced laryngeal elevation;Delayed swallow initiation-vallecula;Pharyngeal residue - valleculae;Pharyngeal residue - pyriform Pharyngeal -- Pharyngeal- Mechanical Soft Delayed swallow initiation-vallecula;Reduced laryngeal elevation;Reduced tongue base retraction;Pharyngeal residue - valleculae;Pharyngeal residue - pyriform Pharyngeal -- Pharyngeal- Regular -- Pharyngeal -- Pharyngeal- Multi-consistency -- Pharyngeal -- Pharyngeal- Pill Pharyngeal residue - pyriform;Pharyngeal residue - valleculae;Reduced tongue base retraction;Reduced laryngeal elevation Pharyngeal -- Pharyngeal Comment --  CHL IP CERVICAL ESOPHAGEAL PHASE 12/14/2020 Cervical Esophageal Phase WFL Pudding Teaspoon -- Pudding Cup -- Honey Teaspoon -- Honey Cup -- Nectar Teaspoon -- Nectar Cup -- Nectar Straw -- Thin Teaspoon -- Thin Cup -- Thin Straw -- Puree -- Mechanical Soft -- Regular -- Multi-consistency -- Pill -- Cervical Esophageal Comment -- Hayden Rasmussen MA, CCC-SLP [facility] 12/14/2020, 2:09 PM              ECHOCARDIOGRAM COMPLETE  Result Date: 12/13/2020    ECHOCARDIOGRAM REPORT   Patient Name:   Dustin Golden Date of Exam: 12/13/2020 Medical Rec #:  [id number]    Height:       76.0 in Accession #:    [id number]   Weight:       194.7 lb Date of Birth:  01/07/1956    BSA:          2.190 m Patient Age:    20 years     BP:           113/65 mmHg Patient Gender: M            HR:  68 bpm. Exam Location:  Inpatient Procedure: 2D Echo,  Cardiac Doppler, Color Doppler and Intracardiac            Opacification Agent Indications:    Stroke  History:        Patient has no prior history of Echocardiogram examinations.                 Stroke; Risk Factors:Current Smoker. ETOH.  Sonographer:    Roseanna Rainbow RDCS Referring Phys: [id number] [place]  Sonographer Comments: Image acquisition challenging due to respiratory motion. Images extremely respiratory. IMPRESSIONS  1. Left ventricular ejection fraction, by estimation, is 35 to 40%. The left ventricle has moderately decreased function. The left ventricle demonstrates global hypokinesis. There is moderate concentric left ventricular hypertrophy. Left ventricular diastolic parameters are consistent with Grade I diastolic dysfunction (impaired relaxation).  2. Right ventricular systolic function is mildly reduced. The right ventricular size is mildly enlarged. There is normal pulmonary artery systolic pressure. The estimated right ventricular systolic pressure is 82.9 mmHg.  3. The mitral valve is normal in structure. Trivial mitral valve regurgitation. No evidence of mitral stenosis. Moderate mitral annular calcification.  4. The aortic valve is normal in structure. Aortic valve regurgitation is not visualized. No aortic stenosis is present.  5. The inferior vena cava is normal in size with greater than 50% respiratory variability, suggesting right atrial pressure of 3 mmHg. FINDINGS  Left Ventricle: Left ventricular ejection fraction, by estimation, is 35 to 40%. The left ventricle has moderately decreased function. The left ventricle demonstrates global hypokinesis. Definity contrast agent was given IV to delineate the left ventricular endocardial borders. The left ventricular internal cavity size was normal in size. There is moderate concentric left ventricular hypertrophy. Left ventricular diastolic parameters are consistent with Grade I diastolic dysfunction (impaired relaxation). Normal left  ventricular filling pressure. Right Ventricle: The right ventricular size is mildly enlarged. No increase in right ventricular wall thickness. Right ventricular systolic function is mildly reduced. There is normal pulmonary artery systolic pressure. The tricuspid regurgitant velocity  is 2.00 m/s, and with an assumed right atrial pressure of 3 mmHg, the estimated right ventricular systolic pressure is 93.7 mmHg. Left Atrium: Left atrial size was normal in size. Right Atrium: Right atrial size was normal in size. Pericardium: There is no evidence of pericardial effusion. Mitral Valve: The mitral valve is normal in structure. There is mild thickening of the mitral valve leaflet(s). Moderate mitral annular calcification. Trivial mitral valve regurgitation. No evidence of mitral valve stenosis. Tricuspid Valve: The tricuspid valve is normal in structure. Tricuspid valve regurgitation is mild . No evidence of tricuspid stenosis. Aortic Valve: The aortic valve is normal in structure. Aortic valve regurgitation is not visualized. No aortic stenosis is present. Pulmonic Valve: The pulmonic valve was normal in structure. Pulmonic valve regurgitation is not visualized. No evidence of pulmonic stenosis. Aorta: The aortic root is normal in size and structure. Venous: The inferior vena cava is normal in size with greater than 50% respiratory variability, suggesting right atrial pressure of 3 mmHg. IAS/Shunts: No atrial level shunt detected by color flow Doppler.  LEFT VENTRICLE PLAX 2D LVIDd:         5.30 cm      Diastology LVIDs:         4.50 cm      LV e' medial:    5.22 cm/s LV PW:         1.60 cm      LV E/e'  medial:  11.4 LV IVS:        1.60 cm      LV e' lateral:   5.92 cm/s LVOT diam:     1.80 cm      LV E/e' lateral: 10.1 LV SV:         42 LV SV Index:   19 LVOT Area:     2.54 cm  LV Volumes (MOD) LV vol d, MOD A2C: 115.0 ml LV vol d, MOD A4C: 142.0 ml LV vol s, MOD A2C: 66.8 ml LV vol s, MOD A4C: 105.0 ml LV SV MOD  A2C:     48.2 ml LV SV MOD A4C:     142.0 ml LV SV MOD BP:      44.8 ml RIGHT VENTRICLE            IVC RV S prime:     7.79 cm/s  IVC diam: 2.40 cm TAPSE (M-mode): 1.6 cm LEFT ATRIUM             Index       RIGHT ATRIUM           Index LA diam:        3.10 cm 1.42 cm/m  RA Area:     17.30 cm LA Vol (A2C):   39.6 ml 18.08 ml/m RA Volume:   50.40 ml  23.01 ml/m LA Vol (A4C):   44.2 ml 20.18 ml/m LA Biplane Vol: 44.3 ml 20.23 ml/m  AORTIC VALVE LVOT Vmax:   90.20 cm/s LVOT Vmean:  62.700 cm/s LVOT VTI:    0.167 m  AORTA Ao Root diam: 3.40 cm MITRAL VALVE               TRICUSPID VALVE MV Area (PHT): 3.21 cm    TR Peak grad:   16.0 mmHg MV Decel Time: 236 msec    TR Vmax:        200.00 cm/s MV E velocity: 59.70 cm/s MV A velocity: 82.00 cm/s  SHUNTS MV E/A ratio:  0.73        Systemic VTI:  0.17 m                            Systemic Diam: 1.80 cm Ena Dawley MD Electronically signed by Ena Dawley MD Signature Date/Time: 12/13/2020/1:53:15 PM    Final    IR PERCUTANEOUS ART THROMBECTOMY/INFUSION INTRACRANIAL INC DIAG ANGIO  Result Date: 12/14/2020 INDICATION: 65 year old male with past medical history significant for tobacco use, alcohol and possible drug abuse. Patient presented severe aphasia and right-sided weakness. Initial NIHSS 22 later improving to 10. Head CT showed hypodensity in the left posterior frontal region (aspects 9) CT/CT angiogram of the head and neck showed a left ICA occlusion at the bulb with intracranial reconstitution in a left M3/MCA branch occlusion. Additionally patient head high-grade stenosis of the cervical right ICA and mild stenosis at the origin of the bilateral vertebral arteries. CT perfusion showed a core infarct of 16 mL with a 58 mL penumbra. He was then transferred to our service for a diagnostic cerebral angiogram with mechanical thrombectomy. EXAM: Ultrasound-guided vascular access Diagnostic cerebral angiogram Mechanical thrombectomy Flat panel head CT Left  carotid stenting with cerebral protection device COMPARISON:  CT/CT angiogram of December 13, 2020 MEDICATIONS: IV cangrelor utilized prior to stent deployment ANESTHESIA/SEDATION: The procedure was performed in the general anesthesia. CONTRAST:  95 mL of Omnipaque 240 milligram/mL FLUOROSCOPY TIME:  Fluoroscopy  Time: 38 minutes 54 seconds (970 mGy). COMPLICATIONS: None immediate. TECHNIQUE: Informed written consent was not obtained given patient's severe aphasia and no reachable healthcare proxy or next of kin available. Maximal Sterile Barrier Technique was utilized including caps, mask, sterile gowns, sterile gloves, sterile drape, hand hygiene and skin antiseptic. A timeout was performed prior to the initiation of the procedure. The right groin was prepped and draped in the usual sterile fashion. Using a micropuncture kit and the modified Seldinger technique, access was gained to the right common femoral artery and an 8 French sheath was placed. Real-time ultrasound guidance was utilized for vascular access including the acquisition of a permanent ultrasound image documenting patency of the accessed vessel. Under fluoroscopy, an 8 [place] Walrus balloon guide catheter was navigated over a 6 [place] Berenstein 2 catheter and a 0.035" Terumo Glidewire into the aortic arch. The catheter was placed into the left common carotid artery. Frontal and lateral angiograms of the neck were obtained. Under biplane roadmap, a zoom 71 aspiration catheter was navigated over a phenom 21 microcatheter and a Aristotle 14 microguidewire into the cavernous segment of the left ICA. The balloon guide catheter was advanced into the upper cervical segment of the left ICA. Frontal and lateral angiograms of the head were obtained via aspiration catheter contrast injection. FINDINGS: 1. Occlusion of the left ICA in the neck at the level of the bulb. 2. Occlusion of a proximal left M2/MCA middle division branch. 3. Nonocclusive filling defect  within a left A4/ACA branch. 4. Patent right common femoral artery. PROCEDURE: Magnified frontal and lateral angiograms of the head were obtained in used as biplane roadmap. The microcatheter was then navigated over the wire into the left M3/MCA middle division branch. Then, a 4 x 40 mm solitaire stent retriever was deployed spanning the M2 and proximal M3 segment. The device was allowed to intercalated with the clot for 4 minutes. The microcatheter was removed. The aspiration catheter was advanced to the level of occlusion and connected to a penumbra aspiration pump. The guiding catheter balloon was inflated. The thrombectomy device and aspiration catheter were removed under constant aspiration. Follow-up left ICA angiogram showed complete recanalization the left MCA vascular tree. The guiding catheter was then retracted into the left common carotid artery. Frontal and lateral angiograms of the neck were obtained. Patent left ICA noted at the bulb with significant luminal irregularity and filling defect. Left common carotid artery angiograms with frontal and lateral of the head showed for opacification of the left MCA branches which remain open. Flat panel CT of the head was obtained and post processed in a separate workstation with concurrent attending physician supervision. Selected images were sent to PACS. No evidence of hemorrhagic complication noted. Follow-up left common carotid artery angiogram showed worsening of the left ICA stenosis at the bulb with near occlusion and slow flow distally. Patient was loaded on cangrelor in anticipation to stent the placement. Under biplane roadmap, a 4-7 Emboshield NAV6 cerebral protection device was navigated into the distal cervical segment of the left ICA. Subsequently, a 9-7 x 40 mm XACT carotid stent was deployed across the area of stenosis in the left ICA. Then, in stent stenosis was performed with a 4 x 30 mm Viatrac balloon. The cerebral protection device was  recaptured. Follow-up left ICA angiogram showed good position of the stent with significantly improved anterograde flow. Left ICA angiograms with frontal and lateral views of the head showed no evidence of thromboembolic complication with significant improvement of the  anterograde flow. Delayed left ICA angiogram showed no evidence of in stent clot formation. Delayed cranial angiograms appear stable. The system was subsequently withdrawn. Left common femoral artery angiograms were obtained with frontal and lateral views. The puncture is at the level of the common femoral artery which shows mild atherosclerotic change with adequate caliber for closure device utilization. The femoral sheath thus exchanged for a Perclose ProGlide which was utilized for access closure. Immediate hemostasis was achieved. IMPRESSION: 1. Successful mechanical thrombectomy for treatment of a left M2/MCA occlusion with a single pass of combined aspiration plus stent retriever achieving complete recanalization (TICI3). 2. Stenting and angioplasty of left ICA severe stenosis with cerebral protection device. PLAN: Patient is to continue on cangrelor drip until follow-up imaging. Then, transition to dual anti-platelet therapy with aspirin and Brilinta. Electronically Signed   By: [name] M.D.   On: 12/14/2020 12:36   CT HEAD CODE STROKE WO CONTRAST  Result Date: 12/13/2020 CLINICAL DATA:  Code stroke. Right sided weakness, facial droop and slurred speech. EXAM: CT HEAD WITHOUT CONTRAST TECHNIQUE: Contiguous axial images were obtained from the base of the skull through the vertex without intravenous contrast. COMPARISON:  None. FINDINGS: Brain: No abnormality affects the brainstem or cerebellum. Cerebral hemispheres are normal for age. No advanced atrophy. No sign of old or acute infarction, mass lesion, hemorrhage, hydrocephalus or extra-axial collection. Vascular: No abnormal vascular finding. Skull: Normal  Sinuses/Orbits: Clear/normal Other: None ASPECTS (Dinosaur Stroke Program Early CT Score) - Ganglionic level infarction (caudate, lentiform nuclei, internal capsule, insula, M1-M3 cortex): 7 - Supraganglionic infarction (M4-M6 cortex): 3 Total score (0-10 with 10 being normal): 10 IMPRESSION: 1. Normal head CT. 2. Aspects 10. 3. These results were communicated to Dr. Curly Shores at 2:00 amon 2/28/2022by text page via the [facility] messaging system. Electronically Signed   By: Nelson Chimes M.D.   On: 12/13/2020 02:02    HISTORY OF PRESENT ILLNESS [name] is a 65 y.o. male with history of tobacco abuse, alcohol use, drug use presented to ED  with aphasia and right sided hemiparesis. DEY81. HCT negative. Code Stroke work up was undertaken.  HOSPITAL COURSE CT Angio head showed a left ICA occlusion at the bulb and proximal left MCA/M2 middle division branch occlusion. He was taken emergently for  thrombectomywith combine stent retriever and aspiration with complete left MCA recanalization after one pass (TICI3). Delay angiogram showed near reocclusion. Patient loaded on cangrelor. A carotid stent was deployed across the bifurcation with use of cerebral protection device followed by in stent angioplasty. He was admitted to the neurologic ICU where he remained stable and then transferred to the floor.   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 ASPECTS 10.   CTA head & neck occlusion of the left ICA  CT perfusion:16 cc completed infarction in the left frontoparietal junction region. Additional 42 cc at risk brain  surrounding that region.  MRI  Acute infarct left posterior MCA distribution involving the posterior insula in the left frontal parietal cortex. Small areas of  acute infarct in the frontal lobes bilaterally.  MRA There is moderate stenosis in the middle cerebral artery M1 segment bilaterally. Left  internal carotid artery appears widely  patent.  2D Echo: EF: 30-40%, left ventricle  demonstrates global hypokinesis, LVH, Grade 1 diastolic dysfunction  LDL 109  HgbA1c 6.8  VTE prophylaxis - SCDs only for now  [facility]  occasional aspirin for pain prior to admission, was on Cangrelor x24 hours s/p thrombectomy Now on aspirin 90m and Brilinta 964mBID   Therapy recommendations: CIR  Disposition:  CIR, Insurance approved, PM&R accepted patient and he was transferred to CIR on 3/4.   30 day monitor at the time of discharge to evaluate for arrhythmias has been requested   Hypertension  Home meds:  none  stable, requiring cleviprex to maintain goals, wean off  Start amlodipine 37m37maily  Change BP goal <160  Hydralazine PRN  Long-term BP goal normotensive  Dysphagia  Likely related to stroke  MBS: Dysphagia 3 solids thin liquids  Monitor po intake for adequacy  Hyperlipidemia  Home meds:  none,   LDL 109, goal < 70  Add simvastatin once patient passes swallow eval   High intensity statin    Continue statin at discharge  Pre Diabetes   Home meds:  none  HgbA1c 6.8, goal < 7.0  CBGs Recent Labs (last 2 labs)         Recent Labs      12/16/20  0521  12/16/20  0746  12/16/20  1100   GLUCAP  150*  134*  100*    ?    SSI  Other Stroke Risk Factors  Cigarette smoker advised to stop smoking  ETOH use, alcohol level <10, advised to drink no more than 1-2 drink(s) a day  Polysubstance abuse - UDS:  THC, Cocaine, tobacco. Patient advised to stop using due to stroke risk.   Congestive heart failure  S/p fall 12/15/20 with headstrike Head CT obtained and stable on 3/2 Neuro exam is stable Forehead hematoma is much improved  Continue to monitor   Alcohol abuse Folic acid, thiamine, and MVI  DISCHARGE EXAM Blood pressure 127/69, pulse 78, temperature 98 F (36.7 C), resp. rate 18, height _0  (1.93 m), weight 86.5 kg, SpO2 91  %.  [age]. male sitting up in chair at beside in NAD. Forehead hematoma edema much improved. Dressing clean,dry and intact.  Patient is awake alert has moderate expressive aphasia with nonfluent speech with word finding difficulties. There is mild dysarthria but can be understood with some difficulty. He has good comprehension. Difficulty with naming and repetition.  Extraocular movements are full range without nystagmus.  Face is asymmetric with mild right lower facial weakness..  Tongue midline.  Motor system exam shows mild right grip and hand and hip flexor weakness   Sensation intact.  Gait not tested.  Discharge Diet      Diet   DIET DYS 3 Room service appropriate? Yes; Fluid consistency: Thin   liquids  DISCHARGE PLAN  Disposition:  Transfer to [facility] ongoing PT, OT and ST  ASA 54m34mily and Brilinta 90 mg BID  Recommend ongoing stroke risk factor control by Primary Care Physician at time of discharge from inpatient rehabilitation. Needs to establish with PCP ASAP.   Follow-up PCP in 2 weeks following discharge from rehab.  Follow-up in [facility] in 4 weeks following discharge from rehab, office to schedule an appointment.   35 minutes were spent preparing discharge. Delila A Bailey-Modzik, NP-C  I have personally obtained history,examined this patient, reviewed notes, independently viewed imaging studies, participated in medical decision making and plan of care.ROS completed by me personally and pertinent positives fully documented  I have made any additions or clarifications directly to the above note. Agree with note above.   PramAntony Contras  MD Medical Director [facility] Pager: [phone] 12/17/2020 1:38 PM

## 2020-12-16 NOTE — Progress Notes (Signed)
Physical Therapy Treatment Patient Details Name: Dustin Golden MRN: [id number] DOB: Feb 25, 1956 Today's Date: 12/16/2020    History of Present Illness 65 year old male with past medical history significant for polysubstance abuse who presented as code stroke with aphasia and found to have occlusion of the left ICA.  Taken for thrombectomy left ICA at bulb and proximal left MCA M2 middle division occlusions, along with stent placement of left ICA at bulb stenosis, achieving a TICI 3 revascularization via right femoral approach 12/13/2020. MRI + Acute infarct left posterior MCA distribution involving the posterior insula in the left frontal parietal cortex. Small areas of acute infarct in the frontal lobes bilaterally.    PT Comments    Pt tolerates treatment well, with improved tolerance for OOB mobility and progression of gait training. Pt is able to progress to ambulating without UE support (demonstrating difficulty managing RW due to RUE coordination deficits). Pt does show inattention to R side which increases his already high falls risk. Pt will benefit from continued acute PT POC to improve dynamic balance and gait quality in an effort to restore independence. PT continues to recommend CIR placement at this time.   Follow Up Recommendations  CIR     Equipment Recommendations  Cane    Recommendations for Other Services       Precautions / Restrictions Precautions Precautions: Fall Precaution Comments: High Fall Risk s/p fall 3/2 with R knee abrasion and large right frontal scalp hematoma. R limb apraxia. Restrictions Weight Bearing Restrictions: No    Mobility  Bed Mobility Overal bed mobility: Needs Assistance Bed Mobility: Supine to Sit;Sit to Supine Rolling: Supervision Sidelying to sit: Mod assist (up from rt side) Supine to sit: Supervision Sit to supine: Supervision   General bed mobility comments: attempting to use RUE to raise torso with mod assist to complete     Transfers Overall transfer level: Needs assistance Equipment used: 1 person hand held assist;Rolling walker (2 wheeled) Transfers: Sit to/from Stand Sit to Stand: Min guard         General transfer comment: cues for hand placement  Ambulation/Gait Ambulation/Gait assistance: Min assist Gait Distance (Feet): 150 Feet Assistive device: Rolling walker (2 wheeled);1 person hand held assist;None Gait Pattern/deviations: Step-through pattern Gait velocity: reduced Gait velocity interpretation: <1.8 ft/sec, indicate of risk for recurrent falls General Gait Details: pt with slowed step-through gait, tends to dirft toward R side, has difficulty maintaining R hand on RW. Bumps into 2 computers on R side in hallway   Stairs             Wheelchair Mobility    Modified Rankin (Stroke Patients Only) Modified Rankin (Stroke Patients Only) Pre-Morbid Rankin Score: No symptoms Modified Rankin: Moderately severe disability     Balance Overall balance assessment: Needs assistance Sitting-balance support: No upper extremity supported;Feet supported Sitting balance-Leahy Scale: Good     Standing balance support: No upper extremity supported Standing balance-Leahy Scale: Fair Standing balance comment: close supervision for static standing                            Cognition Arousal/Alertness: Awake/alert Behavior During Therapy: WFL for tasks assessed/performed Overall Cognitive Status: Impaired/Different from baseline Area of Impairment: Awareness;Safety/judgement;Problem solving                   Current Attention Level: Selective Memory: Decreased short-term memory Following Commands: Follows one step commands consistently Safety/Judgement: Decreased awareness of safety;Decreased awareness of  deficits Awareness: Emergent Problem Solving: Difficulty sequencing General Comments: Patient impulsive attempting to stand several times before this therapist  instructed him to do so.      Exercises General Exercises - Upper Extremity Shoulder Flexion: AROM;Right;10 reps;Seated Elbow Flexion: AROM;Right;10 reps;Seated Elbow Extension: AROM;Right;10 reps;Seated Wrist Flexion: AROM;Right;10 reps;Seated Wrist Extension: AROM;10 reps;Right;Seated Digit Composite Flexion: AROM;Right;10 reps;Seated Composite Extension: AROM;Right;10 reps;Seated    General Comments General comments (skin integrity, edema, etc.): VSS on RA      Pertinent Vitals/Pain Pain Assessment: No/denies pain    Home Living                      Prior Function            PT Goals (current goals can now be found in the care plan section) Acute Rehab PT Goals Patient Stated Goal: return to baseline Progress towards PT goals: Progressing toward goals    Frequency    Min 4X/week      PT Plan Current plan remains appropriate    Co-evaluation              AM-PAC PT "6 Clicks" Mobility   Outcome Measure  Help needed turning from your back to your side while in a flat bed without using bedrails?: A Little Help needed moving from lying on your back to sitting on the side of a flat bed without using bedrails?: A Little Help needed moving to and from a bed to a chair (including a wheelchair)?: A Little Help needed standing up from a chair using your arms (e.g., wheelchair or bedside chair)?: A Little Help needed to walk in hospital room?: A Little Help needed climbing 3-5 steps with a railing? : Total 6 Click Score: 16    End of Session Equipment Utilized During Treatment: Gait belt Activity Tolerance: Patient tolerated treatment well Patient left: in bed;with call bell/phone within reach;with bed alarm set Nurse Communication: Mobility status PT Visit Diagnosis: Hemiplegia and hemiparesis Hemiplegia - Right/Left: Right Hemiplegia - dominant/non-dominant: Dominant Hemiplegia - caused by: Cerebral infarction     Time: 5597-4163 PT Time  Calculation (min) (ACUTE ONLY): 19 min  Charges:  $Gait Training: 8-22 mins                     Zenaida Niece, PT, DPT Acute Rehabilitation Pager: [phone]    Zenaida Niece 12/16/2020, 4:32 PM

## 2020-12-16 NOTE — Plan of Care (Signed)
  Problem: Health Behavior/Discharge Planning: Goal: Ability to manage health-related needs will improve Outcome: Progressing   Problem: Education: Goal: Knowledge of patient specific risk factors addressed and post discharge goals established will improve Outcome: Progressing   Problem: Education: Goal: Knowledge of secondary prevention will improve Outcome: Progressing   Problem: Education: Goal: Knowledge of disease or condition will improve Outcome: Progressing Goal: Knowledge of secondary prevention will improve Outcome: Progressing Goal: Knowledge of patient specific risk factors addressed and post discharge goals established will improve Outcome: Progressing

## 2020-12-16 NOTE — Progress Notes (Addendum)
STROKE TEAM PROGRESS NOTE   INTERVAL HISTORY S/p fall yesterday afternoon with right scalp hematoma noted Post fall CT head stable Scalp hematoma appears slightly smaller.  No complaints of right knee pain no swelling. Patient is sitting up in chair in NAD on room air. Alert and appropriate with stable aphasia. Able to converse in short slow sentences. Appreciative of care given after fall episode. He denies new concerns since fall.  He is hungry and asking for a tray. Nursing assisted with obtaining patient's tray and placed him on automatic ordering d/t his speech difficulties.  We discussed stroke diagnosis and care including plan for rehab at [facility]. He stated understanding.     Vitals:   12/16/20 0422 12/16/20 0600 12/16/20 0742 12/16/20 1200  BP: (!) 146/90  128/71 (!) 138/95  Pulse: 84  74 86  Resp: 18  20 18   Temp: 98.1 F (36.7 C)  97.9 F (36.6 C) 98.2 F (36.8 C)  TempSrc: Oral  Oral   SpO2: 99%  100% 100%  Weight:  86.5 kg    Height:  6\' 4"  (1.93 m)     CBC:  Recent Labs  Lab 12/13/20 0151 12/13/20 0200 12/14/20 0412 12/15/20 0235  WBC 8.1  --  9.4 9.7  NEUTROABS 4.4  --   --   --   HGB 14.4   < > 12.6* 12.2*  HCT 43.3   < > 36.4* 37.0*  MCV 78.9*  --  78.1* 79.6*  PLT 213  --  201 197   < > = values in this interval not displayed.   Basic Metabolic Panel:  Recent Labs  Lab 12/15/20 0235 12/16/20 0411  NA 137 136  K 3.7 3.9  CL 106 105  CO2 18* 19*  GLUCOSE 87 108*  BUN 10 8  CREATININE 1.08 1.04  CALCIUM 8.6* 8.9  MG 2.0 2.1  PHOS 4.2 4.5   Lipid Panel:  Recent Labs  Lab 12/13/20 0555  CHOL 179  TRIG 81  HDL 54  CHOLHDL 3.3  VLDL 16  LDLCALC 109*   HgbA1c:  Recent Labs  Lab 12/13/20 0541  HGBA1C 6.8*   Urine Drug Screen:  Recent Labs  Lab 12/13/20 1245  LABOPIA NONE DETECTED  COCAINSCRNUR POSITIVE*  LABBENZ NONE DETECTED  AMPHETMU NONE DETECTED  THCU POSITIVE*  LABBARB NONE DETECTED    Alcohol Level  Recent Labs  Lab  12/13/20 0541  ETH <10    IMAGING past 24 hours CT Code Stroke CTA Head W/WO contrast  Result Date: 12/13/2020 IMPRESSION: Occlusion of the left ICA at the origin. Reconstitution in the siphon probably from a combination of external to internal collaterals and patent communicating arteries. I cannot clearly identify the occluded MCA branch vessel, though there must be one, probably in the M3 region. 16 cc completed infarction in the left frontoparietal junction region. Additional 42 cc at risk brain surrounding that region. 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 30-50 % stenoses of both vertebral artery origins.  CT Code Stroke CTA Neck W/WO contrast  Result Date: 12/13/2020 IMPRESSION: Occlusion of the left ICA at the origin. Reconstitution in the siphon probably from a combination of external to internal collaterals and patent communicating arteries. I cannot clearly identify the occluded MCA branch vessel, though there must be one, probably in the M3 region. 16 cc completed infarction in the left frontoparietal junction region. Additional 42 cc at risk brain surrounding that  region. 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 30-50 % stenoses of both vertebral artery origins. Results discussed with Dr. Curly Shores at approximately 0220 hours Electronically Signed   By: Nelson Chimes M.D.   On: 12/13/2020 02:36   CT Code Stroke Cerebral Perfusion with contrast  Result Date: 12/13/2020 IMPRESSION: Occlusion of the left ICA at the origin. Reconstitution in the siphon probably from a combination of external to internal collaterals and patent communicating arteries. I cannot clearly identify the occluded MCA branch vessel, though there must be one, probably in the M3 region. 16 cc completed infarction in the left frontoparietal junction region. Additional 42 cc at risk brain surrounding that  region. 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 30-50 % stenoses of both vertebral artery origins. Results discussed with Dr. Curly Shores at approximately 0220 hours Electronically Signed   By: Nelson Chimes M.D.   On: 12/13/2020 02:36   CT HEAD CODE STROKE WO CONTRAST  Result Date: 12/13/2020 1. Normal head CT.  2. Aspects 10.   Echo 12/13/2020 1. Left ventricular ejection fraction, by estimation, is 35 to 40%. The  left ventricle has moderately decreased function. The left ventricle  demonstrates global hypokinesis. There is moderate concentric left  ventricular hypertrophy. Left ventricular  diastolic parameters are consistent with Grade I diastolic dysfunction  (impaired relaxation).  2. Right ventricular systolic function is mildly reduced. The right  ventricular size is mildly enlarged. There is normal pulmonary artery  systolic pressure. The estimated right ventricular systolic pressure is  29.7 mmHg.  3. The mitral valve is normal in structure. Trivial mitral valve  regurgitation. No evidence of mitral stenosis. Moderate mitral annular  calcification.  4. The aortic valve is normal in structure. Aortic valve regurgitation is  not visualized. No aortic stenosis is present.  5. The inferior vena cava is normal in size with greater than 50%  respiratory variability, suggesting right atrial pressure of 3 mmHg.     PHYSICAL EXAM Middle-age African-American male not in distress. . Afebrile. Head is nontraumatic. Neck is supple without bruit.    Cardiac exam no murmur or gallop. Lungs are clear to auscultation. Distal pulses are well felt. Neurological Exam :  Patient is awake alert has moderate expressive aphasia with nonfluent speech with word finding difficulties.  There is mild dysarthria but can be understood with some difficulties.  He has good comprehension.  Difficulty with naming and repetition.  Extraocular movements are  full range without nystagmus.  Face is asymmetric with mild right lower facial weakness..  Tongue midline.  Motor system exam shows mild right grip and hand and hip flexor weakness   Sensation intact.  Gait not tested.  ASSESSMENT/PLAN Mr. TAB RYLEE is a 65 y.o. male with history of tobacco abuse, alcohol use, drug use presenting with aphasia and right sided hemiparesis.   CT Angio head showed a left ICA occlusion at the bulb and proximal left MCA/M2 middle division branch occlusion. NIHSS 22 initially subsequently 10. s/p thrombectomy with combine stent retriever and aspiration with complete left MCA recanalization after one pass (TICI3). Delay angiogram showed near reocclusion. Patient loaded on cangrelor. A carotid stent was deployed across the bifurcation with use of cerebral protection device followed by in stent angioplasty.   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 ASPECTS 10.  CTA head & neck occlusion of the left ICA  CT perfusion:16 cc completed infarction in the left frontoparietal junction region. Additional 42 cc at risk brain  surrounding that region.  MRI  Acute infarct left posterior MCA distribution involving the posterior insula in the left frontal parietal cortex. Small areas of  acute infarct in the frontal lobes bilaterally.  MRA There is moderate stenosis in the middle cerebral artery M1 segment bilaterally. Left internal carotid artery appears widely  patent.  2D Echo: EF: 30-40%, left ventricle  demonstrates global hypokinesis, LVH, Grade 1 diastolic dysfunction  LDL 109  HgbA1c 6.8  VTE prophylaxis - SCDs only for now  Took occasional aspirin for pain prior to admission, was on Cangrelor x24 hours s/p thrombectomy Now on aspirin 81mg  and Brilinta 90mg  BID   Will likely need 30 day event monitor on discharge if no arrythmias captured   Therapy recommendations:  PT  CIR, OT pending  Disposition:  Pending  30 day monitor at the time of discharge to evaluate for arrhythmias has been requested   Hypertension  Home meds:  none  stable, requiring cleviprex to maintain goals, wean off  Start amlodipine 5mg  daily  Change BP goal <160  Hydralazine PRN . Long-term BP goal normotensive  Dysphagia  Likely related to stroke  MBS: Dysphagia 3 solids thin liquids  Hyperlipidemia  Home meds:  none,   LDL 109, goal < 70  Add simvastatin once patient passes swallow eval   High intensity statin    Continue statin at discharge  Pre Diabetes   Home meds:  none  HgbA1c 6.8, goal < 7.0  CBGs Recent Labs    12/16/20 0521 12/16/20 0746 12/16/20 1100  GLUCAP 150* 134* 100*      SSI  Other Stroke Risk Factors  Cigarette smoker advised to stop smoking  ETOH use, alcohol level <10, advised to drink no more than 1-2 drink(s) a day  Substance abuse - UDS:  THC, Cocaine. Patient advised to stop using due to stroke risk.   Congestive heart failure  Other Active Problems  S/p fall 3/2 Head CT stable Neuro exam is stable Continue to monitor    Alcohol abuse  Folic acid, thiamine, and MVI  Hospital day # 3  I have personally obtained history,examined this patient, reviewed notes, independently viewed imaging studies, participated in medical decision making and plan of care.ROS completed by me personally and pertinent positives fully documented  I have made any additions or clarifications directly to the above note. Agree with note above.  Continue ongoing therapies.  Mobilize out of bed.  Transfer to rehab next couple of days when bed available.  Long discussion patient and answered questions.  Greater than 50% time during this 25-minute visit was spent in counseling and coordination of care about his stroke and   rehabilitation plan.  Antony Contras, MD Medical Director [facility] Pager: [phone] 12/16/2020 3:29  PM      To contact Stroke Continuity provider, please refer to [email or web address] After hours, contact General Neurology

## 2020-12-17 ENCOUNTER — Encounter (HOSPITAL_COMMUNITY): Payer: Self-pay | Admitting: Physical Medicine & Rehabilitation

## 2020-12-17 ENCOUNTER — Other Ambulatory Visit: Payer: Self-pay | Admitting: Physician Assistant

## 2020-12-17 ENCOUNTER — Inpatient Hospital Stay (HOSPITAL_COMMUNITY)
Admission: RE | Admit: 2020-12-17 | Discharge: 2020-12-28 | DRG: 057 | Disposition: A | Discharge status: Home / Self Care | Payer: Commercial Managed Care - PPO | Source: Intra-hospital | Attending: Physical Medicine & Rehabilitation | Admitting: Physical Medicine & Rehabilitation

## 2020-12-17 ENCOUNTER — Other Ambulatory Visit: Payer: Self-pay

## 2020-12-17 DIAGNOSIS — I6300 Cerebral infarction due to thrombosis of unspecified precerebral artery: Secondary | ICD-10-CM

## 2020-12-17 DIAGNOSIS — Z888 Allergy status to other drugs, medicaments and biological substances status: Secondary | ICD-10-CM | POA: Diagnosis not present

## 2020-12-17 DIAGNOSIS — I69351 Hemiplegia and hemiparesis following cerebral infarction affecting right dominant side: Principal | ICD-10-CM

## 2020-12-17 DIAGNOSIS — Z886 Allergy status to analgesic agent status: Secondary | ICD-10-CM

## 2020-12-17 DIAGNOSIS — I63512 Cerebral infarction due to unspecified occlusion or stenosis of left middle cerebral artery: Secondary | ICD-10-CM

## 2020-12-17 DIAGNOSIS — Z882 Allergy status to sulfonamides status: Secondary | ICD-10-CM | POA: Diagnosis not present

## 2020-12-17 DIAGNOSIS — S0003XD Contusion of scalp, subsequent encounter: Secondary | ICD-10-CM

## 2020-12-17 DIAGNOSIS — R0602 Shortness of breath: Secondary | ICD-10-CM

## 2020-12-17 DIAGNOSIS — J449 Chronic obstructive pulmonary disease, unspecified: Secondary | ICD-10-CM | POA: Diagnosis present

## 2020-12-17 DIAGNOSIS — E871 Hypo-osmolality and hyponatremia: Secondary | ICD-10-CM

## 2020-12-17 DIAGNOSIS — E785 Hyperlipidemia, unspecified: Secondary | ICD-10-CM | POA: Diagnosis present

## 2020-12-17 DIAGNOSIS — Z7902 Long term (current) use of antithrombotics/antiplatelets: Secondary | ICD-10-CM | POA: Diagnosis not present

## 2020-12-17 DIAGNOSIS — Z794 Long term (current) use of insulin: Secondary | ICD-10-CM

## 2020-12-17 DIAGNOSIS — W1830XD Fall on same level, unspecified, subsequent encounter: Secondary | ICD-10-CM | POA: Diagnosis not present

## 2020-12-17 DIAGNOSIS — Z7151 Drug abuse counseling and surveillance of drug abuser: Secondary | ICD-10-CM | POA: Diagnosis not present

## 2020-12-17 DIAGNOSIS — H5789 Other specified disorders of eye and adnexa: Secondary | ICD-10-CM

## 2020-12-17 DIAGNOSIS — I10 Essential (primary) hypertension: Secondary | ICD-10-CM

## 2020-12-17 DIAGNOSIS — Z716 Tobacco abuse counseling: Secondary | ICD-10-CM

## 2020-12-17 DIAGNOSIS — F17210 Nicotine dependence, cigarettes, uncomplicated: Secondary | ICD-10-CM | POA: Diagnosis present

## 2020-12-17 DIAGNOSIS — I69322 Dysarthria following cerebral infarction: Secondary | ICD-10-CM

## 2020-12-17 DIAGNOSIS — Z79899 Other long term (current) drug therapy: Secondary | ICD-10-CM | POA: Diagnosis not present

## 2020-12-17 DIAGNOSIS — Z7982 Long term (current) use of aspirin: Secondary | ICD-10-CM

## 2020-12-17 DIAGNOSIS — K5900 Constipation, unspecified: Secondary | ICD-10-CM | POA: Diagnosis present

## 2020-12-17 DIAGNOSIS — S80211D Abrasion, right knee, subsequent encounter: Secondary | ICD-10-CM

## 2020-12-17 DIAGNOSIS — N179 Acute kidney failure, unspecified: Secondary | ICD-10-CM

## 2020-12-17 DIAGNOSIS — I69320 Aphasia following cerebral infarction: Secondary | ICD-10-CM

## 2020-12-17 HISTORY — DX: Cerebral infarction due to unspecified occlusion or stenosis of left middle cerebral artery: I63.512

## 2020-12-17 LAB — GLUCOSE, CAPILLARY
Glucose-Capillary: 121 mg/dL — ABNORMAL HIGH (ref 70–99)
Glucose-Capillary: 129 mg/dL — ABNORMAL HIGH (ref 70–99)
Glucose-Capillary: 103 mg/dL — ABNORMAL HIGH (ref 70–99)
Glucose-Capillary: 89 mg/dL (ref 70–99)
Glucose-Capillary: 157 mg/dL — ABNORMAL HIGH (ref 70–99)

## 2020-12-17 MED ORDER — ASPIRIN 81 MG PO CHEW: 81.0000 mg | CHEWABLE_TABLET | Freq: Every day | ORAL | Status: DC

## 2020-12-17 MED ORDER — ACETAMINOPHEN 160 MG/5ML PO SOLN: 650.0000 mg | ORAL | Status: DC | PRN

## 2020-12-17 MED ORDER — PANTOPRAZOLE SODIUM 40 MG PO TBEC: 40.0000 mg | DELAYED_RELEASE_TABLET | Freq: Every day | ORAL | Status: DC

## 2020-12-17 MED ORDER — ACETAMINOPHEN 325 MG PO TABS: 650.0000 mg | ORAL_TABLET | ORAL | Status: AC | PRN

## 2020-12-17 MED ORDER — NICOTINE 7 MG/24HR TD PT24
7.0000 mg | MEDICATED_PATCH | Freq: Every day | TRANSDERMAL | Status: DC
  Administered 2020-12-18 – 2020-12-28 (×11): 7 mg via TRANSDERMAL
  Filled 2020-12-17 (×11): qty 1

## 2020-12-17 MED ORDER — ATORVASTATIN CALCIUM 10 MG PO TABS
20.0000 mg | ORAL_TABLET | Freq: Every day | ORAL | Status: DC
  Administered 2020-12-17 – 2020-12-27 (×11): 20 mg via ORAL
  Filled 2020-12-17 (×11): qty 2

## 2020-12-17 MED ORDER — PANTOPRAZOLE SODIUM 40 MG PO TBEC
40.0000 mg | DELAYED_RELEASE_TABLET | Freq: Every day | ORAL | Status: DC
  Administered 2020-12-17 – 2020-12-27 (×11): 40 mg via ORAL
  Filled 2020-12-17 (×11): qty 1

## 2020-12-17 MED ORDER — AMLODIPINE BESYLATE 10 MG PO TABS
10.0000 mg | ORAL_TABLET | Freq: Every day | ORAL | Status: DC
  Administered 2020-12-18 – 2020-12-21 (×4): 10 mg via ORAL
  Filled 2020-12-17 (×4): qty 1

## 2020-12-17 MED ORDER — POLYETHYLENE GLYCOL 3350 17 G PO PACK: 17.0000 g | PACK | Freq: Every day | ORAL | Status: DC | PRN

## 2020-12-17 MED ORDER — ACETAMINOPHEN 325 MG PO TABS
650.0000 mg | ORAL_TABLET | ORAL | Status: DC | PRN
  Administered 2020-12-18 – 2020-12-19 (×2): 650 mg via ORAL
  Filled 2020-12-17 (×3): qty 2

## 2020-12-17 MED ORDER — HYDRALAZINE HCL 20 MG/ML IJ SOLN: 20.0000 mg | Freq: Four times a day (QID) | INTRAMUSCULAR | Status: DC | PRN

## 2020-12-17 MED ORDER — LORAZEPAM 1 MG PO TABS: 1.0000 mg | ORAL_TABLET | ORAL | 0 refills | Status: DC | PRN

## 2020-12-17 MED ORDER — LISINOPRIL 2.5 MG PO TABS
2.5000 mg | ORAL_TABLET | Freq: Every day | ORAL | Status: DC
  Administered 2020-12-18 – 2020-12-28 (×11): 2.5 mg via ORAL
  Filled 2020-12-17 (×12): qty 1

## 2020-12-17 MED ORDER — INSULIN ASPART 100 UNIT/ML ~~LOC~~ SOLN: 0.0000 [IU] | SUBCUTANEOUS | 11 refills | Status: DC

## 2020-12-17 MED ORDER — AMLODIPINE BESYLATE 10 MG PO TABS: 10.0000 mg | ORAL_TABLET | Freq: Every day | ORAL | Status: DC

## 2020-12-17 MED ORDER — THIAMINE HCL 100 MG PO TABS: 100.0000 mg | ORAL_TABLET | Freq: Every day | ORAL | Status: DC

## 2020-12-17 MED ORDER — FOLIC ACID 1 MG PO TABS
1.0000 mg | ORAL_TABLET | Freq: Every day | ORAL | Status: DC
  Administered 2020-12-18 – 2020-12-28 (×11): 1 mg via ORAL
  Filled 2020-12-17 (×11): qty 1

## 2020-12-17 MED ORDER — POLYETHYLENE GLYCOL 3350 17 G PO PACK: 17.0000 g | PACK | Freq: Every day | ORAL | 0 refills | Status: DC | PRN

## 2020-12-17 MED ORDER — ASPIRIN 81 MG PO CHEW
81.0000 mg | CHEWABLE_TABLET | Freq: Every day | ORAL | Status: DC
  Administered 2020-12-18 – 2020-12-28 (×11): 81 mg via ORAL
  Filled 2020-12-17 (×11): qty 1

## 2020-12-17 MED ORDER — NICOTINE 7 MG/24HR TD PT24: 7.0000 mg | MEDICATED_PATCH | Freq: Every day | TRANSDERMAL | 0 refills | Status: DC

## 2020-12-17 MED ORDER — SENNOSIDES-DOCUSATE SODIUM 8.6-50 MG PO TABS: 1.0000 | ORAL_TABLET | Freq: Every evening | ORAL | Status: DC | PRN

## 2020-12-17 MED ORDER — THIAMINE HCL 100 MG PO TABS
100.0000 mg | ORAL_TABLET | Freq: Every day | ORAL | Status: DC
  Administered 2020-12-18 – 2020-12-28 (×11): 100 mg via ORAL
  Filled 2020-12-17 (×11): qty 1

## 2020-12-17 MED ORDER — SIMVASTATIN 40 MG PO TABS: 40.0000 mg | ORAL_TABLET | Freq: Every day | ORAL | Status: DC

## 2020-12-17 MED ORDER — DOCUSATE SODIUM 100 MG PO CAPS: 100.0000 mg | ORAL_CAPSULE | Freq: Two times a day (BID) | ORAL | Status: DC | PRN

## 2020-12-17 MED ORDER — TICAGRELOR 90 MG PO TABS: 90.0000 mg | ORAL_TABLET | Freq: Two times a day (BID) | ORAL | Status: DC

## 2020-12-17 MED ORDER — DOCUSATE SODIUM 100 MG PO CAPS: 100.0000 mg | ORAL_CAPSULE | Freq: Two times a day (BID) | ORAL | 0 refills | Status: DC | PRN

## 2020-12-17 MED ORDER — ADULT MULTIVITAMIN W/MINERALS CH
1.0000 | ORAL_TABLET | Freq: Every day | ORAL | Status: DC
  Administered 2020-12-18 – 2020-12-28 (×11): 1 via ORAL
  Filled 2020-12-17 (×11): qty 1

## 2020-12-17 MED ORDER — SIMVASTATIN 20 MG PO TABS: 40.0000 mg | ORAL_TABLET | Freq: Every day | ORAL | Status: DC

## 2020-12-17 MED ORDER — SENNOSIDES-DOCUSATE SODIUM 8.6-50 MG PO TABS
1.0000 | ORAL_TABLET | Freq: Every evening | ORAL | Status: DC | PRN
Start: 2020-12-17 — End: 2020-12-28

## 2020-12-17 MED ORDER — FOLIC ACID 1 MG PO TABS
1.0000 mg | ORAL_TABLET | Freq: Every day | ORAL | Status: DC
Start: 2020-12-18 — End: 2020-12-27

## 2020-12-17 MED ORDER — ADULT MULTIVITAMIN W/MINERALS CH: 1.0000 | ORAL_TABLET | Freq: Every day | ORAL | Status: DC

## 2020-12-17 MED ORDER — ACETAMINOPHEN 650 MG RE SUPP: 650.0000 mg | RECTAL | Status: DC | PRN

## 2020-12-17 MED ORDER — TICAGRELOR 90 MG PO TABS
90.0000 mg | ORAL_TABLET | Freq: Two times a day (BID) | ORAL | Status: DC
  Administered 2020-12-17 – 2020-12-28 (×22): 90 mg via ORAL
  Filled 2020-12-17 (×22): qty 1

## 2020-12-17 NOTE — H&P (Signed)
Physical Medicine and Rehabilitation Admission H&P    No chief complaint on file. : HPI: Dustin Golden is a 65 year old right-handed male with history of tobacco and alcohol use and marijuana on no prescription medications.  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  1 level home.  Presented 12/13/2020 with acute onset of right-sided weakness and aphasia.  Admission chemistries unremarkable except glucose 117 creatinine 1.28 alcohol negative, urine drug screen positive cocaine as well as marijuana.  Cranial CT scan negative.  Patient did not receive TPA.  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  30 to 50% stenosis of both vertebral artery origins.  Patient underwent emergent mechanical thrombectomy per interventional radiology.  MRI/MRA showed acute infarction left posterior MCA distribution involving the posterior insula and the left frontal parietal cortex.  Small areas of acute infarction in the frontal lobes bilaterally.  MRA demonstrated loss of signal in the right posterior cerebral artery.  Echocardiogram with ejection fraction of 35 to 78% grade 1 diastolic dysfunction.  Neurology follow-up currently maintained on aspirin as well as Brilinta for CVA prophylaxis.  Maintain on a mechanical soft thin liquid diet.  Patient did have a reported fall 12/15/2020 sustaining small abrasion on the right knee and large right facial scalp hematoma patient remained awake alert and interactive unchanged from baseline with follow-up per neurology services and received follow-up cranial CT scan after a fall redemonstrating moderate to large acute early subacute cortical and subcortical left MCA infarct no acute changes. Therapy evaluations completed due to patient's right side weakness and aphasia was admitted for a comprehensive rehab  program. He currently has no complaints.   Review of Systems  Constitutional: Negative for chills and fever.  HENT: Negative for hearing loss.   Eyes: Negative for blurred vision and double vision.  Respiratory: Negative for cough and shortness of breath.   Cardiovascular: Negative for chest pain, palpitations and leg swelling.  Gastrointestinal: Positive for constipation. Negative for heartburn, nausea and vomiting.  Genitourinary: Negative for dysuria, flank pain and hematuria.  Musculoskeletal: Positive for joint pain and myalgias.  Skin: Negative for rash.  Neurological: Positive for speech change and weakness.  All other systems reviewed and are negative.  Past Medical History:  Diagnosis Date  . Hypertension    Past Surgical History:  Procedure Laterality Date  . APPENDECTOMY    . gastric ulcer surgery    . IR CT HEAD LTD  12/13/2020  . IR INTRAVSC STENT CERV CAROTID W/EMB-PROT MOD SED INCL ANGIO  12/14/2020  . IR PERCUTANEOUS ART THROMBECTOMY/INFUSION INTRACRANIAL INC DIAG ANGIO  12/13/2020  . IR US GUIDE VASC ACCESS RIGHT  12/13/2020  . RADIOLOGY WITH ANESTHESIA N/A 12/13/2020   Procedure: IR WITH ANESTHESIA;  Surgeon: Luanne Bras, MD;  Location: [facility];  Service: Radiology;  Laterality: N/A;   History reviewed. No pertinent family history. Social History:  reports that he has been smoking. He has a 96.00 pack-year smoking history. He has never used smokeless tobacco. He reports current alcohol use. He reports current drug use. Drug: Marijuana. Allergies:  Allergies  Allergen Reactions  . Ibuprofen Other (See Comments)    Nosebleeds   . Sulfa Antibiotics Rash and Other (See Comments)    "Blisters to skin with cream"  . Sulfamethoxazole Rash and Other (See Comments)    "  Blisters to skin with cream"   Medications Prior to Admission  Medication Sig Dispense Refill  . acetaminophen (TYLENOL) 325 MG tablet Take 2 tablets (650 mg total) by mouth every 4 (four) hours as  needed for mild pain (or temp > 37.5 C (99.5 F)).    Derrill Memo ON 12/18/2020] amLODipine (NORVASC) 10 MG tablet Take 1 tablet (10 mg total) by mouth daily.    Derrill Memo ON 12/18/2020] aspirin 81 MG chewable tablet Chew 1 tablet (81 mg total) by mouth daily.    Marland Kitchen docusate sodium (COLACE) 100 MG capsule Take 1 capsule (100 mg total) by mouth 2 (two) times daily as needed for mild constipation. 10 capsule 0  . [START ON 11/20/2351] folic acid (FOLVITE) 1 MG tablet Take 1 tablet (1 mg total) by mouth daily.    . hydrALAZINE (APRESOLINE) 20 MG/ML injection Inject 1 mL (20 mg total) into the vein every 6 (six) hours as needed (for SBP > 160). 1 mL   . insulin aspart (NOVOLOG) 100 UNIT/ML injection Inject 0-9 Units into the skin every 4 (four) hours. 10 mL 11  . LORazepam (ATIVAN) 1 MG tablet Take 1-4 tablets (1-4 mg total) by mouth every hour as needed (withdrawal symptoms:  anxiety, agitation, insomnia, diaphoresis, nausea, vomiting, tremors, tachycardia, or hypertension.). 30 tablet 0  . [START ON 12/18/2020] Multiple Vitamin (MULTIVITAMIN WITH MINERALS) TABS tablet Take 1 tablet by mouth daily.    Derrill Memo ON 12/18/2020] nicotine (NICODERM CQ - DOSED IN MG/24 HR) 7 mg/24hr patch Place 1 patch (7 mg total) onto the skin daily. 28 patch 0  . pantoprazole (PROTONIX) 40 MG tablet Take 1 tablet (40 mg total) by mouth at bedtime.    . polyethylene glycol (MIRALAX / GLYCOLAX) 17 g packet Take 17 g by mouth daily as needed for moderate constipation. 14 each 0  . senna-docusate (SENOKOT-S) 8.6-50 MG tablet Take 1 tablet by mouth at bedtime as needed for mild constipation or moderate constipation.    . simvastatin (ZOCOR) 40 MG tablet Take 1 tablet (40 mg total) by mouth daily at 6 PM. 30 tablet   . [START ON 12/18/2020] thiamine 100 MG tablet Take 1 tablet (100 mg total) by mouth daily.    . ticagrelor (BRILINTA) 90 MG TABS tablet Take 1 tablet (90 mg total) by mouth 2 (two) times daily. 60 tablet     Drug Regimen  Review Drug regimen was reviewed and remains appropriate with no significant issues identified  Home: Home Living Family/patient expects to be discharged to:: Private residence Living Arrangements: Alone Available Help at Discharge: Family,Available 24 hours/day Type of Home: House Home Access: Stairs to enter [employer] of Steps: 1 Home Layout: One level,Other (Comment) (basement) Bathroom Shower/Tub: Tub/shower unit,Curtain Biochemist, clinical: Standard Bathroom Accessibility: Yes Home Equipment: Cane - single point,Crutches (walking stick)  Lives With: Alone   Functional History: Prior Function Level of Independence: Independent Comments: Truck driver - Box truck  Functional Status:  Mobility: Bed Mobility Overal bed mobility: Needs Assistance Bed Mobility: Rolling,Sidelying to Sit,Sit to Supine Rolling: Supervision Sidelying to sit: Mod assist (up from rt side) Sit to supine: Min assist General bed mobility comments: attempting to use RUE to raise torso with mod assist to complete Transfers Overall transfer level: Needs assistance Equipment used: 1 person hand held assist Transfers: Sit to/from Stand Sit to Stand: Min assist General transfer comment: pt with lean to right and requires cues to achieve midline Ambulation/Gait Ambulation/Gait assistance: Min assist Gait  Distance (Feet): 1 Feet Assistive device: 2 person hand held assist Gait Pattern/deviations: Step-to pattern General Gait Details: pre-gait activities at bedside (fwd/backward stepping with each foot: sidestepping to his left x 4 ft); currently recommend +2 for lines and safety to progress to gait  ADL: ADL Overall ADL's : Needs assistance/impaired Eating/Feeding: NPO Grooming: Moderate assistance Upper Body Bathing: Moderate assistance,Sitting Lower Body Bathing: Moderate assistance,Sit to/from stand Upper Body Dressing : Moderate assistance,Sitting Lower Body Dressing: Maximal  assistance Toilet Transfer: Moderate assistance,+2 for physical assistance Toileting - Clothing Manipulation Details (indicate cue type and reason): Max A; foley Functional mobility during ADLs: Moderate assistance,+2 for physical assistance  Cognition: Cognition Overall Cognitive Status: Impaired/Different from baseline Arousal/Alertness: Awake/alert Orientation Level: Oriented X4 Attention: Alternating Alternating Attention: Impaired Memory: Impaired Memory Impairment: Decreased recall of new information,Decreased short term memory Decreased Short Term Memory: Verbal complex,Functional complex Problem Solving: Impaired Executive Function: Sequencing,Organizing Sequencing: Impaired Sequencing Impairment: Verbal complex Organizing: Impaired Organizing Impairment: Verbal complex,Functional complex Safety/Judgment: Impaired Cognition Arousal/Alertness: Awake/alert Behavior During Therapy: WFL for tasks assessed/performed Overall Cognitive Status: Impaired/Different from baseline Area of Impairment: Memory,Following commands,Safety/judgement,Awareness,Problem solving Current Attention Level: Selective Memory: Decreased short-term memory Following Commands: Follows one step commands consistently Safety/Judgement: Decreased awareness of deficits Awareness: Emergent Problem Solving: Slow processing,Difficulty sequencing,Requires verbal cues,Requires tactile cues General Comments: slow processing along with requiring incr time to activate rt hemibody  Physical Exam: Blood pressure (!) 162/83, pulse 72, temperature 98.5 F (36.9 C), temperature source Oral, resp. rate 18, weight 83.4 kg, SpO2 100 %. Gen: no distress, normal appearing, eating lunch independently HEENT: oral mucosa pink and moist, Right scalp hematoma with dressing in place Cardio: Reg rate Chest: normal effort, normal rate of breathing Abd: soft, non-distended Ext: no edema Psych: pleasant, normal affect Skin:  intact Neurological:     Comments: Patient is alert in no acute distress.  Makes eye contact with examiner.  Did have some mild expressive difficulties and dysarthria.  Follows simple commands. 5/5 strength except for 4/5 in right hand grip and hip flexors  Results for orders placed or performed during the hospital encounter of 12/17/20 (from the past 48 hour(s))  Glucose, capillary     Status: None   Collection Time: 12/17/20  4:15 PM  Result Value Ref Range   Glucose-Capillary 89 70 - 99 mg/dL    Comment: Glucose reference range applies only to samples taken after fasting for at least 8 hours.   Comment 1 Notify RN    DG Chest Port 1 View  Result Date: 12/16/2020 CLINICAL DATA:  Abnormal respiration. EXAM: PORTABLE CHEST 1 VIEW COMPARISON:  May 12, 2019. FINDINGS: The heart size and mediastinal contours are within normal limits. Both lungs are clear. The visualized skeletal structures are unremarkable. IMPRESSION: No active disease. Electronically Signed   By: Marijo Conception M.D.   On: 12/16/2020 09:16       Medical Problem List and Plan: 1.  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  Status post mechanical thrombectomy  -patient may shower  -ELOS/Goals: modI in 5-7 days  -Continue CIR 2.  Antithrombotics: -DVT/anticoagulation: Continue SCDs  -antiplatelet therapy: Aspirin 81 mg daily and Brilinta 90 mg twice daily 3. Pain Management: Continue Tylenol as needed 4. Mood: Provide emotional support  -antipsychotic agents: N/A 5. Neuropsych: This patient is capable of making decisions on his own behalf. 6. Skin/Wound Care: Routine skin checks 7. Fluids/Electrolytes/Nutrition: Routine in and  outs with follow-up chemistries 8.  Hypertension.  Norvasc 10 mg daily.  Monitor with increased mobility  3/4: BP elevated last several reads. Add Lisinopril 2.5mg .  9.  History of alcohol tobacco as well as  polysubstance abuse.  Provide counseling 10.  Hyperlipidemia.  Zocor 11. Mechanical fall 12/15/2020. Sustained small abrasion right knee and large right facial scalp hematoma. Follow-up CT scan unchanged  I have personally performed a face to face diagnostic evaluation, including, but not limited to relevant history and physical exam findings, of this patient and developed relevant assessment and plan.  Additionally, I have reviewed and concur with the physician assistant's documentation above.  Lavon Paganini Angiulli, PA-C  Izora Ribas, MD 12/17/2020

## 2020-12-17 NOTE — TOC CAGE-AID Note (Signed)
Transition of Care [facility]) - CAGE-AID Screening   Patient Details  Name: Dustin Golden MRN: [id number] Date of Birth: Nov 25, 1955  Transition of Care [facility]) CM/SW Contact:    Pollie Friar, RN Phone Number: 12/17/2020, 12:17 PM   Clinical Narrative: Pt states he only drinks occasionally. Pt did accept resources for inpatient and outpatient alcohol/ drug counseling.   CAGE-AID Screening:    Have You Ever Felt You Ought to Cut Down on Your Drinking or Drug Use?: No Have People Annoyed You By Critizing Your Drinking Or Drug Use?: No Have You Felt Bad Or Guilty About Your Drinking Or Drug Use?: No Have You Ever Had a Drink or Used Drugs First Thing In The Morning to Steady Your Nerves or to Get Rid of a Hangover?: No CAGE-AID Score: 0  Substance Abuse Education Offered: Yes  Substance abuse interventions: Designer, jewellery (must comment)

## 2020-12-17 NOTE — TOC Transition Note (Signed)
Transition of Care [facility]) - CM/SW Discharge Note   Patient Details  Name: Dustin Golden MRN: [id number] Date of Birth: [birth year]  Transition of Care [facility] CM/SW Contact:  Pollie Friar, RN Phone Number: 12/17/2020, 12:11 PM   Clinical Narrative:    Patient is discharging to CIR today. CM signing off.   Final next level of care: IP Rehab Facility Barriers to Discharge: No Barriers Identified   Patient Goals and CMS Choice     Choice offered to / list presented to : Patient  Discharge Placement                       Discharge Plan and Services   Discharge Planning Services: CM Consult                                 Social Determinants of Health (SDOH) Interventions     Readmission Risk Interventions No flowsheet data found.

## 2020-12-17 NOTE — Progress Notes (Signed)
Inpatient Rehabilitation Medication Review by a Pharmacist  A complete drug regimen review was completed for this patient to identify any potential clinically significant medication issues.  Clinically significant medication issues were identified:  yes   Type of Medication Issue Identified Description of Issue Urgent (address now) Non-Urgent (address on AM team rounds) Plan Plan Accepted by Provider? (Yes / No / Pending AM Rounds)  Drug Interaction(s) (clinically significant)  Plasma concentrations and pharmacologic effects of simvastatin may be increased by amlodipine and ticagrelor. Simvastatin doses should be limited to 20 mg maximum when also taking amlodipine. Current simvastatin dose ordered is 40 mg.      Duplicate Therapy       Allergy       No Medication Administration End Date       Incorrect Dose       Additional Drug Therapy Needed       Other         Provider Method of Notification: Secure chat  For non-urgent medication issues to be resolved on team rounds tomorrow morning a [facility] Secure Chat Handoff was sent to: Lauraine Rinne, PA  Pharmacist comments: Discontinued simvastatin and replaced with atorvastatin dose equivalent.  Time spent performing this drug regimen review (minutes):  Cameron, PharmD 12/17/2020 4:30 PM  Please check [email or web address] for unit-specific pharmacy phone numbers.

## 2020-12-17 NOTE — Progress Notes (Signed)
Pt A&Ox4. VSS on RA. Report given to [facility] on 4W CIR. All belongings with pt on discharge.

## 2020-12-17 NOTE — Progress Notes (Signed)
Signed        PMR Admission Coordinator Pre-Admission Assessment   Patient: Dustin Golden is an 65 y.o., male MRN: [id number] DOB: 1956-06-14 Height: 6\' 4"  (193 cm) Weight: 86.5 kg                                                                                                                                                  Insurance Information HMO:     PPO: yes     PCP:      IPA:      80/20:      OTHER:  PRIMARY: [place]      Policy#: [id number]      Subscriber: pt CM Name: Lattie Haw      Phone#: [phone]     Fax#: [phone] Pre-Cert#: [id number]  Received approval 12/16/20 for 7 days beginning 12/17/20-12/24/20.    Employer:  Benefits:  Phone #: [phone]     Name: 3/2 Eff. Date: 04/15/2020     Deduct: $3000      Out of Pocket Max: [phone]      Life Max: none  CIR: 70%      SNF: 100% 120 days per year Outpatient: 70%     Co-Pay: 20 combined per year Home Health: 70%      Co-Pay: 30% DME: 70%     Co-Pay: 30% Providers: in network  SECONDARY: none        Financial Counselor:       Phone#:    The Engineer, [employer]" for patients in Inpatient Rehabilitation Facilities with attached "Privacy Act [place] Records" was provided and verbally reviewed with: N/A   Emergency Contact Information         Contact Information     Name Relation Home Work Mobile    Dustin, [place] Golden Daughter     [phone]    [name], [name] Sister [phone]        [name], [name] Mother [phone]        [name] Friend     [phone]       Current Medical History  Patient Admitting Diagnosis: CVA   History of Present Illness:  65 year old right-handed male with history of tobacco and alcohol use and marijuana on no prescription medications.  Presented 12/13/2020 with acute onset of right-sided weakness and aphasia.  Admission chemistries unremarkable except glucose 117 creatinine 1.28 alcohol negative, urine drug screen positive cocaine  as well as marijuana.  Cranial CT scan negative.  Patient did not receive TPA.  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  30 to 50% stenosis of both vertebral artery origins.  Patient underwent emergent mechanical thrombectomy per interventional radiology.  MRI/MRA showed  acute infarction left posterior MCA distribution involving the posterior insula and the left frontal parietal cortex.  Small areas of acute infarction in the frontal lobes bilaterally.  MRA demonstrated loss of signal in the right posterior cerebral artery.  Echocardiogram with ejection fraction of 35 to 81% grade 1 diastolic dysfunction.  Neurology follow-up currently maintained on aspirin as well as Brilinta for CVA prophylaxis.  Maintain on a mechanical soft thin liquid diet.  Patient did have a reported fall 12/15/2020 sustaining small abrasion on the right knee and large right facial scalp hematoma patient remained awake alert and interactive unchanged from baseline with follow-up per neurology services and received follow-up cranial CT scan after a fall re demonstrating moderate to large acute early subacute cortical and subcortical left MCA infarct no acute changes..      Complete NIHSS TOTAL: 5 Glasgow Coma Scale Score: 15   Past Medical History      Past Medical History:  Diagnosis Date  . Hypertension        Family History  family history is not on file.   Prior Rehab/Hospitalizations:  Has the patient had prior rehab or hospitalizations prior to admission? Yes   Has the patient had major surgery during 100 days prior to admission? Yes   Current Medications    Current Facility-Administered Medications:  .   stroke: mapping our early stages of recovery book, , Does not apply, Once, Bhagat, Srishti L, MD .  acetaminophen (TYLENOL) tablet 650 mg, 650 mg, Oral, Q4H PRN, 650 mg at 12/16/20  2142 **OR** acetaminophen (TYLENOL) 160 MG/5ML solution 650 mg, 650 mg, Per Tube, Q4H PRN **OR** acetaminophen (TYLENOL) suppository 650 mg, 650 mg, Rectal, Q4H PRN, Bhagat, Srishti L, MD .  amLODipine (NORVASC) tablet 10 mg, 10 mg, Oral, Daily, Jennelle Human B, NP, 10 mg at 12/17/20 1029 .  aspirin chewable tablet 81 mg, 81 mg, Oral, Daily, Garvin Fila, MD, 81 mg at 12/17/20 1029 .  Chlorhexidine Gluconate Cloth 2 % PADS 6 each, 6 each, Topical, Daily, Garvin Fila, MD, 6 each at 12/17/20 1029 .  docusate sodium (COLACE) capsule 100 mg, 100 mg, Oral, BID PRN, Bhagat, Srishti L, MD .  [DISCONTINUED] folic acid injection 1 mg, 1 mg, Intravenous, Daily, 1 mg at 19/14/78 2956 **OR** folic acid (FOLVITE) tablet 1 mg, 1 mg, Oral, Daily, Gleason, Otilio Carpen, PA-C, 1 mg at 12/17/20 1029 .  hydrALAZINE (APRESOLINE) injection 20 mg, 20 mg, Intravenous, Q6H PRN, Olivencia-Simmons, Ivelisse, NP, 20 mg at 12/14/20 1505 .  insulin aspart (novoLOG) injection 0-9 Units, 0-9 Units, Subcutaneous, Q4H, Bhagat, Srishti L, MD, 1 Units at 12/16/20 0524 .  multivitamin with minerals tablet 1 tablet, 1 tablet, Oral, Daily, Gleason, Otilio Carpen, PA-C, 1 tablet at 12/17/20 1029 .  nicotine (NICODERM CQ - dosed in mg/24 hr) patch 7 mg, 7 mg, Transdermal, Daily, Minor, Grace Bushy, NP, 7 mg at 12/17/20 1028 .  pantoprazole (PROTONIX) EC tablet 40 mg, 40 mg, Oral, QHS, Priscella Mann, RPH, 40 mg at 12/16/20 2142 .  polyethylene glycol (MIRALAX / GLYCOLAX) packet 17 g, 17 g, Oral, Daily PRN, Bhagat, Srishti L, MD .  senna-docusate (Senokot-S) tablet 1 tablet, 1 tablet, Oral, QHS PRN, Bhagat, Srishti L, MD .  simvastatin (ZOCOR) tablet 40 mg, 40 mg, Oral, q1800, Olivencia-Simmons, Ivelisse, NP, 40 mg at 12/16/20 1732 .  sodium chloride flush (NS) 0.9 % injection 3 mL, 3 mL, Intravenous, Once, Palumbo, April, MD .  [DISCONTINUED] thiamine (B-1) injection 100  mg, 100 mg, Intravenous, Daily, 100 mg at 12/13/20 1808 **OR** thiamine  tablet 100 mg, 100 mg, Oral, Daily, Gleason, Otilio Carpen, PA-C, 100 mg at 12/17/20 1029 .  ticagrelor (BRILINTA) tablet 90 mg, 90 mg, Oral, BID, Garvin Fila, MD, 90 mg at 12/17/20 1029   Patients Current Diet:     Diet Order                      DIET DYS 3 Room service appropriate? Yes; Fluid consistency: Thin  Diet effective now                      Precautions / Restrictions Precautions Precautions: Fall Precaution Comments: High Fall Risk s/p fall 3/2 with R knee abrasion and large right frontal scalp hematoma. R limb apraxia. Restrictions Weight Bearing Restrictions: No    Has the patient had 2 or more falls or a fall with injury in the past year?No   Prior Activity Level Community (5-7x/wk): working fulltime and driving   Prior Functional Level Prior Function Level of Independence: Independent Comments: [place]: Did the patient need help bathing, dressing, using the toilet or eating?  Independent   Indoor Mobility: Did the patient need assistance with walking from room to room (with or without device)? Independent   Stairs: Did the patient need assistance with internal or external stairs (with or without device)? Independent   Functional Cognition: Did the patient need help planning regular tasks such as shopping or remembering to take medications? Independent   Home Assistive Devices / Equipment Home Assistive Devices/Equipment: None Home Equipment: Cane - single point,Crutches (walking stick)   Prior Device Use: Indicate devices/aids used by the patient prior to current illness, exacerbation or injury? None of the above   Current Functional Level Cognition   Arousal/Alertness: Awake/alert Overall Cognitive Status: Impaired/Different from baseline Current Attention Level: Selective Orientation Level: Oriented X4 Following Commands: Follows one step commands consistently Safety/Judgement: Decreased awareness of safety,Decreased  awareness of deficits General Comments: Patient impulsive attempting to stand several times before this therapist instructed him to do so. Attention: Alternating Alternating Attention: Impaired Memory: Impaired Memory Impairment: Decreased recall of new information,Decreased short term memory Decreased Short Term Memory: Verbal complex,Functional complex Problem Solving: Impaired Executive Function: Sequencing,Organizing Sequencing: Impaired Sequencing Impairment: Verbal complex Organizing: Impaired Organizing Impairment: Verbal complex,Functional complex Safety/Judgment: Impaired    Extremity Assessment (includes Sensation/Coordination)   Upper Extremity Assessment: Defer to OT evaluation RUE Deficits / Details: ROM overall WFL; 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 Pt attempts to use functionally however unaware of position of hand/arm and has difficulty manipulating objects; at risk for injuring R hand; unable to oppose thumb to digits RUE Sensation: decreased light touch,decreased proprioception RUE Coordination: decreased fine motor,decreased gross motor  Lower Extremity Assessment: Defer to PT evaluation RLE Deficits / Details: decreased coordination related to decr sensory input; inattention also likely contributing to inconsistent muscular support in standing RLE Sensation:  (to possibly absent--very delayed responses with 50% accuracy)     ADLs   Overall ADL's : Needs assistance/impaired Eating/Feeding: NPO Grooming: Moderate assistance Grooming Details (indicate cue type and reason): Patient able to wash face with Min assist and cues for use of affected RUE. Unable to feel food on R side of chin requiring increased cues for accuracy. Upper Body Bathing: Moderate assistance,Sitting Lower Body Bathing: Moderate assistance,Sit to/from stand Upper Body Dressing : Moderate assistance,Sitting  Upper Body Dressing Details (indicate cue type and  reason): Significant difficulty threading RUE through hospital gown 2/2 apraxia. Increased cues for attention to RUE 2/2 inattention. Lower Body Dressing: Maximal assistance Toilet Transfer: Moderate assistance,+2 for physical assistance Toileting - Clothing Manipulation Details (indicate cue type and reason): Max A; foley Functional mobility during ADLs: Moderate assistance,+2 for physical assistance     Mobility   Overal bed mobility: Needs Assistance Bed Mobility: Supine to Sit,Sit to Supine Rolling: Supervision Sidelying to sit: Mod assist (up from rt side) Supine to sit: Supervision Sit to supine: Supervision General bed mobility comments: attempting to use RUE to raise torso with mod assist to complete     Transfers   Overall transfer level: Needs assistance Equipment used: 1 person hand held assist,Rolling walker (2 wheeled) Transfers: Sit to/from Stand Sit to Stand: Min guard General transfer comment: cues for hand placement     Ambulation / Gait / Stairs / Wheelchair Mobility   Ambulation/Gait Ambulation/Gait assistance: Herbalist (Feet): 150 Feet Assistive device: Rolling walker (2 wheeled),1 person hand held assist,None Gait Pattern/deviations: Step-through pattern General Gait Details: pt with slowed step-through gait, tends to dirft toward R side, has difficulty maintaining R hand on RW. Bumps into 2 computers on R side in hallway Gait velocity: reduced Gait velocity interpretation: <1.8 ft/sec, indicate of risk for recurrent falls     Posture / Balance Dynamic Sitting Balance Sitting balance - Comments: right leaning; can correct with cues Balance Overall balance assessment: Needs assistance Sitting-balance support: No upper extremity supported,Feet supported Sitting balance-Leahy Scale: Good Sitting balance - Comments: right leaning; can correct with cues Standing balance support: No upper extremity supported Standing balance-Leahy Scale:  Fair Standing balance comment: close supervision for static standing     Special needs/care consideration Fall in hospital 3/2 with right scalp hematoma Hgb A1c 6.8 Urine Drug screen positive      Previous Home Environment  Living Arrangements: Alone  Lives With: Alone Available Help at Discharge: Family,Available 24 hours/day Type of Home: House Home Layout: One level,Other (Comment) (basement) Home Access: Stairs to enter Entrance Stairs-Number of Steps: 1 Bathroom Shower/Tub: Diplomatic Services operational officer Accessibility: Yes How Accessible: Accessible via walker,Accessible via wheelchair [place]: No   Discharge Living Setting Plans for Discharge Living Setting: Patient's home,Alone Type of Home at Discharge: House Discharge Home Layout: One level Discharge Home Access: Stairs to enter Entrance Stairs-Rails: None Entrance Stairs-Number of Steps: 1 Discharge Bathroom Shower/Tub: Tub/shower unit Discharge Bathroom Toilet: Standard Discharge Bathroom Accessibility: Yes How Accessible: Accessible via walker Does the patient have any problems obtaining your medications?: No   Social/Family/Support Systems Contact Information: daughter, Zhi in [place] and girlfriend, [name], local Anticipated Caregiver: [name], girlfriend and his Mom Anticipated Caregiver's Contact Information: see above Ability/Limitations of Caregiver: [name] is an Therapist, sports who works 1 day per week as a Charity fundraiser Availability: 24/7 Discharge Plan Discussed with Primary Caregiver: Yes Is Caregiver In Agreement with Plan?: Yes Does Caregiver/Family have Issues with Lodging/Transportation while Pt is in Rehab?: No   Goals Patient/Family Goal for Rehab: Mod I to supervision with PT, OT and SLP Expected length of stay: [facility] 2 to 3 weeks Pt/Family Agrees to Admission and willing to participate: Yes Program Orientation Provided & Reviewed with  Pt/Caregiver Including Roles  & Responsibilities: Yes   Decrease burden of Care through IP rehab admission: n/a   Possible need for SNF placement upon discharge:not anticipated   Patient Condition:  This patient's condition remains as documented in the consult dated 12/15/20, in which the Rehabilitation Physician determined and documented that the patient's condition is appropriate for intensive rehabilitative care in an inpatient rehabilitation facility. Will admit to inpatient rehab today.   Preadmission Screen Completed By: Danne Baxter RN MSN with updates by Bethel Born, [place], 12/17/2020 11:53 AM ______________________________________________________________________   Discussed status with Dr. Ranell Patrick on 12/17/20  at 11:53 AM and received approval for admission today.   Admission Coordinator: Danne Baxter RN MSN with updates by Bethel Born, time 11:54 AM/Date 12/17/20

## 2020-12-17 NOTE — Plan of Care (Signed)
  Problem: Health Behavior/Discharge Planning: Goal: Ability to manage health-related needs will improve Outcome: Progressing   Problem: Health Behavior/Discharge Planning: Goal: Ability to manage health-related needs will improve Outcome: Progressing   Problem: Education: Goal: Knowledge of patient specific risk factors addressed and post discharge goals established will improve Outcome: Progressing   Problem: Education: Goal: Knowledge of secondary prevention will improve Outcome: Progressing   Problem: Education: Goal: Knowledge of disease or condition will improve Outcome: Progressing   Problem: Safety: Goal: Non-violent Restraint(s) Outcome: Progressing

## 2020-12-17 NOTE — Progress Notes (Signed)
Signed     Expand All Collapse All             Physical Medicine and Rehabilitation Consult Reason for Consult: Right side weakness and aphasia Referring Physician: Dr. Leonie Man     HPI: Dustin Golden is a 65 y.o. right-handed male with history of tobacco and alcohol use as well as marijuana on no scheduled prescription medications.  Per chart review patient lives alone independent prior to admission.  1 level home.  Presented 12/13/2020 with acute onset of right-sided weakness and aphasia.  Admission chemistries unremarkable except glucose 117, creatinine 1.28, alcohol negative, urine drug screen positive cocaine as well as marijuana.  Cranial CT scan negative.  Patient did not receive TPA.  CT angiogram of head and neck showed occlusion of the left ICA at the origin.  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  30 to 50% stenosis of both vertebral artery origins.  Patient underwent emergent mechanical thrombectomy per interventional radiology.  MRI/MRA showed acute infarct left posterior MCA distribution involving the posterior insula and the left frontal parietal cortex.  Small areas of acute infarct in the frontal lobes bilaterally.  MRA demonstrated loss of signal in the right posterior cerebral artery.  Echocardiogram with ejection fraction of 35 to 32% grade 1 diastolic dysfunction.  Neurology follow-up patient currently maintained on aspirin as well as Brilinta for CVA prophylaxis.  Maintained on a mechanical soft thin liquid diet.  Therapy evaluations completed due to patient's right side weakness and aphasia recommendations for physical medicine rehab consult. He has no complaints currently. Wife and daughter are in agreement with CIR.      Review of Systems  Constitutional: Negative for chills and fever.  HENT: Negative for hearing loss.   Eyes: Negative for blurred vision and double vision.  Respiratory: Negative for cough and  shortness of breath.   Cardiovascular: Negative for chest pain and palpitations.  Gastrointestinal: Positive for constipation. Negative for heartburn, nausea and vomiting.  Genitourinary: Negative for dysuria, flank pain and hematuria.  Musculoskeletal: Positive for joint pain and myalgias.  Skin: Negative for rash.  Neurological: Positive for speech change, weakness and headaches.  All other systems reviewed and are negative.   No past medical history on file.      Past Surgical History:  Procedure Laterality Date  . APPENDECTOMY      . gastric ulcer surgery      . IR CT HEAD LTD   12/13/2020  . IR INTRAVSC STENT CERV CAROTID W/EMB-PROT MOD SED INCL ANGIO   12/14/2020  . IR PERCUTANEOUS ART THROMBECTOMY/INFUSION INTRACRANIAL INC DIAG ANGIO   12/13/2020  . IR US GUIDE VASC ACCESS RIGHT   12/13/2020  . RADIOLOGY WITH ANESTHESIA N/A 12/13/2020    Procedure: IR WITH ANESTHESIA;  Surgeon: Luanne Bras, MD;  Location: [facility];  Service: Radiology;  Laterality: N/A;    No family history on file. Social History:  reports that he has been smoking. He does not have any smokeless tobacco history on file. He reports current alcohol use. He reports current drug use. Drug: Marijuana. Allergies:       Allergies  Allergen Reactions  . Ibuprofen Other (See Comments)      Nosebleeds    . Sulfa Antibiotics Rash and Other (See Comments)      "Blisters to skin with cream"  . Sulfamethoxazole Rash and Other (See Comments)      "Blisters to skin with  cream"          Medications Prior to Admission  Medication Sig Dispense Refill  . BAYER ASPIRIN 325 MG EC tablet Take 325 mg by mouth as needed for pain.          Home: Home Living Family/patient expects to be discharged to:: Private residence Living Arrangements: Alone Available Help at Discharge: Family,Available 24 hours/day Type of Home: House Home Access: Stairs to enter [employer] of Steps: 1 Home Layout: One level,Other  (Comment) (basement) Bathroom Shower/Tub: Tub/shower unit,Curtain Biochemist, clinical: Standard Bathroom Accessibility: Yes Home Equipment: Cane - single point,Crutches (walking stick)  Lives With: Alone  Functional History: Prior Function Level of Independence: Independent Comments: Truck driver - Box truck Functional Status:  Mobility: Bed Mobility Overal bed mobility: Needs Assistance Bed Mobility: Rolling,Sidelying to Sit,Sit to Supine Rolling: Min assist Sidelying to sit: Min assist Sit to supine: Min assist General bed mobility comments: assist to RUE to protect from injury as decreased awareness of location Transfers Overall transfer level: Needs assistance Equipment used: 2 person hand held assist Transfers: Sit to/from Stand Sit to Stand: Mod assist,+2 physical assistance General transfer comment: pt with lean to right and requires support to achieve and maintain midline Ambulation/Gait Ambulation/Gait assistance: Mod assist,+2 physical assistance Gait Distance (Feet): 1 Feet Assistive device: 2 person hand held assist Gait Pattern/deviations: Step-to pattern General Gait Details: side-step to his left with small steps and support for wt-shifting and max cues for RLE placement   ADL: ADL Overall ADL's : Needs assistance/impaired Eating/Feeding: NPO Grooming: Moderate assistance Upper Body Bathing: Moderate assistance,Sitting Lower Body Bathing: Moderate assistance,Sit to/from stand Upper Body Dressing : Moderate assistance,Sitting Lower Body Dressing: Maximal assistance Toilet Transfer: Moderate assistance,+2 for physical assistance Toileting - Clothing Manipulation Details (indicate cue type and reason): Max A; foley Functional mobility during ADLs: Moderate assistance,+2 for physical assistance   Cognition: Cognition Overall Cognitive Status: Impaired/Different from baseline Arousal/Alertness: Awake/alert Orientation Level: Oriented X4 Attention:  Alternating Alternating Attention: Impaired Memory: Impaired Memory Impairment: Decreased recall of new information,Decreased short term memory Decreased Short Term Memory: Verbal complex,Functional complex Problem Solving: Impaired Executive Function: Sequencing,Organizing Sequencing: Impaired Sequencing Impairment: Verbal complex Organizing: Impaired Organizing Impairment: Verbal complex,Functional complex Safety/Judgment: Impaired Cognition Arousal/Alertness: Awake/alert Behavior During Therapy: WFL for tasks assessed/performed Overall Cognitive Status: Impaired/Different from baseline Area of Impairment: Attention,Memory,Following commands,Safety/judgement,Awareness,Problem solving Current Attention Level: Selective Memory: Decreased short-term memory Following Commands: Follows one step commands consistently Safety/Judgement: Decreased awareness of safety,Decreased awareness of deficits Awareness: Emergent Problem Solving: Slow processing,Difficulty sequencing   Blood pressure (!) 178/78, pulse 93, temperature 98.9 F (37.2 C), temperature source Oral, resp. rate (!) 23, weight 85.3 kg, SpO2 100 %. Physical Exam Gen: no distress, normal appearing HEENT: oral mucosa pink and moist, NCAT Cardio: Reg rate Chest: increased work of breathing.  Abd: soft, non-distended Ext: no edema Psych: pleasant, normal affect Skin: intact Neuro: Musculoskeletal: Neurological:     Comments: Patient is alert in no acute distress.  Makes eye contact with examiner.  Expressive aphasia.  Follows some simple commands. Alert and oriented x3. 5/5 strength on left and 4/5 on right    Lab Results Last 24 Hours       Results for orders placed or performed during the hospital encounter of 12/13/20 (from the past 24 hour(s))  Glucose, capillary     Status: None    Collection Time: 12/14/20  8:20 AM  Result Value Ref Range    Glucose-Capillary 98 70 - 99 mg/dL  Magnesium     Status: None     Collection Time: 12/14/20 10:31 AM  Result Value Ref Range    Magnesium 2.1 1.7 - 2.4 mg/dL  Phosphorus     Status: None    Collection Time: 12/14/20 10:31 AM  Result Value Ref Range    Phosphorus 3.4 2.5 - 4.6 mg/dL  Glucose, capillary     Status: Abnormal    Collection Time: 12/14/20 12:04 PM  Result Value Ref Range    Glucose-Capillary 108 (H) 70 - 99 mg/dL  Glucose, capillary     Status: None    Collection Time: 12/14/20  4:53 PM  Result Value Ref Range    Glucose-Capillary 87 70 - 99 mg/dL  Glucose, capillary     Status: Abnormal    Collection Time: 12/14/20  7:58 PM  Result Value Ref Range    Glucose-Capillary 103 (H) 70 - 99 mg/dL  Glucose, capillary     Status: None    Collection Time: 12/15/20 12:11 AM  Result Value Ref Range    Glucose-Capillary 89 70 - 99 mg/dL  CBC     Status: Abnormal    Collection Time: 12/15/20  2:35 AM  Result Value Ref Range    WBC 9.7 4.0 - 10.5 K/uL    RBC 4.65 4.22 - 5.81 MIL/uL    Hemoglobin 12.2 (L) 13.0 - 17.0 g/dL    HCT 37.0 (L) 39.0 - 52.0 %    MCV 79.6 (L) 80.0 - 100.0 fL    MCH 26.2 26.0 - 34.0 pg    MCHC 33.0 30.0 - 36.0 g/dL    RDW 15.1 11.5 - 15.5 %    Platelets 197 150 - 400 K/uL    nRBC 0.0 0.0 - 0.2 %  Basic metabolic panel     Status: Abnormal    Collection Time: 12/15/20  2:35 AM  Result Value Ref Range    Sodium 137 135 - 145 mmol/L    Potassium 3.7 3.5 - 5.1 mmol/L    Chloride 106 98 - 111 mmol/L    CO2 18 (L) 22 - 32 mmol/L    Glucose, Bld 87 70 - 99 mg/dL    BUN 10 8 - 23 mg/dL    Creatinine, Ser 1.08 0.61 - 1.24 mg/dL    Calcium 8.6 (L) 8.9 - 10.3 mg/dL    GFR, Estimated >60 >60 mL/min    Anion gap 13 5 - 15  Phosphorus     Status: None    Collection Time: 12/15/20  2:35 AM  Result Value Ref Range    Phosphorus 4.2 2.5 - 4.6 mg/dL  Magnesium     Status: None    Collection Time: 12/15/20  2:35 AM  Result Value Ref Range    Magnesium 2.0 1.7 - 2.4 mg/dL       Imaging Results (Last 48 hours)  MR ANGIO  HEAD WO CONTRAST   Result Date: 12/13/2020 CLINICAL DATA:  Stroke. Post left internal carotid artery thrombectomy and stenting of left carotid bifurcation. EXAM: MRI HEAD WITHOUT CONTRAST MRA HEAD WITHOUT CONTRAST TECHNIQUE: Multiplanar, multiecho pulse sequences of the brain and surrounding structures were obtained without intravenous contrast. Angiographic images of the head were obtained using MRA technique without contrast. COMPARISON:  CT angio head and neck and CT perfusion 12/13/2020 FINDINGS: MRI HEAD FINDINGS Brain: Acute infarct in the left parietal lobe over the convexity. This extends anteriorly into the operculum and posterior insula. Small areas of acute infarct in the  left frontal white matter and cortex and in the right frontal white matter. No associated hemorrhage. Ventricle size normal.  No mass or midline shift. Vascular: Normal arterial flow voids. Skull and upper cervical spine: No focal lesion. Sinuses/Orbits: Paranasal sinuses clear.  Negative orbit Other: Motion degraded study. MRA HEAD FINDINGS Both vertebral arteries patent to the basilar. PICA patent bilaterally. Basilar widely patent. Superior cerebellar arteries patent bilaterally. Left posterior cerebral artery widely patent. Patent left posterior communicating artery. Fetal origin of the right posterior cerebral artery. Decreased signal in the right P1 and P2 segment. This vessel appeared patent on CTA earlier today. Question artifact versus thrombus. Internal carotid artery patent bilaterally without stenosis. Focal moderate stenosis left M1 segment. There is good distal flow and right middle cerebral artery branches are patent. Anterior cerebral arteries patent bilaterally. Moderate stenosis right middle cerebral artery. IMPRESSION: 1. Acute infarct left posterior MCA distribution involving the posterior insula in the left frontal parietal cortex. Small areas of acute infarct in the frontal lobes bilaterally. 2. Motion degraded  study. 3. MRA demonstrates loss of signal in the right posterior cerebral artery. This vessel was patent on CTA earlier today. Possible artifact versus thrombus. 4. There is moderate stenosis in the middle cerebral artery M1 segment bilaterally. Left internal carotid artery appears widely patent. Electronically Signed   By: Franchot Gallo M.D.   On: 12/13/2020 19:17    MR BRAIN WO CONTRAST   Result Date: 12/13/2020 CLINICAL DATA:  Stroke. Post left internal carotid artery thrombectomy and stenting of left carotid bifurcation. EXAM: MRI HEAD WITHOUT CONTRAST MRA HEAD WITHOUT CONTRAST TECHNIQUE: Multiplanar, multiecho pulse sequences of the brain and surrounding structures were obtained without intravenous contrast. Angiographic images of the head were obtained using MRA technique without contrast. COMPARISON:  CT angio head and neck and CT perfusion 12/13/2020 FINDINGS: MRI HEAD FINDINGS Brain: Acute infarct in the left parietal lobe over the convexity. This extends anteriorly into the operculum and posterior insula. Small areas of acute infarct in the left frontal white matter and cortex and in the right frontal white matter. No associated hemorrhage. Ventricle size normal.  No mass or midline shift. Vascular: Normal arterial flow voids. Skull and upper cervical spine: No focal lesion. Sinuses/Orbits: Paranasal sinuses clear.  Negative orbit Other: Motion degraded study. MRA HEAD FINDINGS Both vertebral arteries patent to the basilar. PICA patent bilaterally. Basilar widely patent. Superior cerebellar arteries patent bilaterally. Left posterior cerebral artery widely patent. Patent left posterior communicating artery. Fetal origin of the right posterior cerebral artery. Decreased signal in the right P1 and P2 segment. This vessel appeared patent on CTA earlier today. Question artifact versus thrombus. Internal carotid artery patent bilaterally without stenosis. Focal moderate stenosis left M1 segment. There is  good distal flow and right middle cerebral artery branches are patent. Anterior cerebral arteries patent bilaterally. Moderate stenosis right middle cerebral artery. IMPRESSION: 1. Acute infarct left posterior MCA distribution involving the posterior insula in the left frontal parietal cortex. Small areas of acute infarct in the frontal lobes bilaterally. 2. Motion degraded study. 3. MRA demonstrates loss of signal in the right posterior cerebral artery. This vessel was patent on CTA earlier today. Possible artifact versus thrombus. 4. There is moderate stenosis in the middle cerebral artery M1 segment bilaterally. Left internal carotid artery appears widely patent. Electronically Signed   By: Franchot Gallo M.D.   On: 12/13/2020 19:17    IR INTRAVSC STENT CERV CAROTID W/EMB-PROT MOD SED   Result Date: 12/14/2020 INDICATION:  65 year old male with past medical history significant for tobacco use, alcohol and possible drug abuse. Patient presented severe aphasia and right-sided weakness. Initial NIHSS 22 later improving to 10. Head CT showed hypodensity in the left posterior frontal region (aspects 9) CT/CT angiogram of the head and neck showed a left ICA occlusion at the bulb with intracranial reconstitution in a left M3/MCA branch occlusion. Additionally patient head high-grade stenosis of the cervical right ICA and mild stenosis at the origin of the bilateral vertebral arteries. CT perfusion showed a core infarct of 16 mL with a 58 mL penumbra. He was then transferred to our service for a diagnostic cerebral angiogram with mechanical thrombectomy. EXAM: Ultrasound-guided vascular access Diagnostic cerebral angiogram Mechanical thrombectomy Flat panel head CT Left carotid stenting with cerebral protection device COMPARISON:  CT/CT angiogram of December 13, 2020 MEDICATIONS: IV cangrelor utilized prior to stent deployment ANESTHESIA/SEDATION: The procedure was performed in the general anesthesia. CONTRAST:  95 mL  of Omnipaque 240 milligram/mL FLUOROSCOPY TIME:  Fluoroscopy Time: 38 minutes 54 seconds (970 mGy). COMPLICATIONS: None immediate. TECHNIQUE: Informed written consent was not obtained given patient's severe aphasia and no reachable healthcare proxy or next of kin available. Maximal Sterile Barrier Technique was utilized including caps, mask, sterile gowns, sterile gloves, sterile drape, hand hygiene and skin antiseptic. A timeout was performed prior to the initiation of the procedure. The right groin was prepped and draped in the usual sterile fashion. Using a micropuncture kit and the modified Seldinger technique, access was gained to the right common femoral artery and an 8 French sheath was placed. Real-time ultrasound guidance was utilized for vascular access including the acquisition of a permanent ultrasound image documenting patency of the accessed vessel. Under fluoroscopy, an 8 [place] Walrus balloon guide catheter was navigated over a 6 [place] Berenstein 2 catheter and a 0.035" Terumo Glidewire into the aortic arch. The catheter was placed into the left common carotid artery. Frontal and lateral angiograms of the neck were obtained. Under biplane roadmap, a zoom 71 aspiration catheter was navigated over a phenom 21 microcatheter and a Aristotle 14 microguidewire into the cavernous segment of the left ICA. The balloon guide catheter was advanced into the upper cervical segment of the left ICA. Frontal and lateral angiograms of the head were obtained via aspiration catheter contrast injection. FINDINGS: 1. Occlusion of the left ICA in the neck at the level of the bulb. 2. Occlusion of a proximal left M2/MCA middle division branch. 3. Nonocclusive filling defect within a left A4/ACA branch. 4. Patent right common femoral artery. PROCEDURE: Magnified frontal and lateral angiograms of the head were obtained in used as biplane roadmap. The microcatheter was then navigated over the wire into the left M3/MCA middle  division branch. Then, a 4 x 40 mm solitaire stent retriever was deployed spanning the M2 and proximal M3 segment. The device was allowed to intercalated with the clot for 4 minutes. The microcatheter was removed. The aspiration catheter was advanced to the level of occlusion and connected to a penumbra aspiration pump. The guiding catheter balloon was inflated. The thrombectomy device and aspiration catheter were removed under constant aspiration. Follow-up left ICA angiogram showed complete recanalization the left MCA vascular tree. The guiding catheter was then retracted into the left common carotid artery. Frontal and lateral angiograms of the neck were obtained. Patent left ICA noted at the bulb with significant luminal irregularity and filling defect. Left common carotid artery angiograms with frontal and lateral of the head showed for opacification  of the left MCA branches which remain open. Flat panel CT of the head was obtained and post processed in a separate workstation with concurrent attending physician supervision. Selected images were sent to PACS. No evidence of hemorrhagic complication noted. Follow-up left common carotid artery angiogram showed worsening of the left ICA stenosis at the bulb with near occlusion and slow flow distally. Patient was loaded on cangrelor in anticipation to stent the placement. Under biplane roadmap, a 4-7 Emboshield NAV6 cerebral protection device was navigated into the distal cervical segment of the left ICA. Subsequently, a 9-7 x 40 mm XACT carotid stent was deployed across the area of stenosis in the left ICA. Then, in stent stenosis was performed with a 4 x 30 mm Viatrac balloon. The cerebral protection device was recaptured. Follow-up left ICA angiogram showed good position of the stent with significantly improved anterograde flow. Left ICA angiograms with frontal and lateral views of the head showed no evidence of thromboembolic complication with significant  improvement of the anterograde flow. Delayed left ICA angiogram showed no evidence of in stent clot formation. Delayed cranial angiograms appear stable. The system was subsequently withdrawn. Left common femoral artery angiograms were obtained with frontal and lateral views. The puncture is at the level of the common femoral artery which shows mild atherosclerotic change with adequate caliber for closure device utilization. The femoral sheath thus exchanged for a Perclose ProGlide which was utilized for access closure. Immediate hemostasis was achieved. IMPRESSION: 1. Successful mechanical thrombectomy for treatment of a left M2/MCA occlusion with a single pass of combined aspiration plus stent retriever achieving complete recanalization (TICI3). 2. Stenting and angioplasty of left ICA severe stenosis with cerebral protection device. PLAN: Patient is to continue on cangrelor drip until follow-up imaging. Then, transition to dual anti-platelet therapy with aspirin and Brilinta. Electronically Signed   By: [name] M.D.   On: 12/14/2020 12:36    DG Swallowing Func-Speech Pathology   Result Date: 12/14/2020 Objective Swallowing Evaluation: Type of Study: MBS-Modified Barium Swallow Study  Patient Details Name: Dustin Golden MRN: [id number] Date of Birth: Aug 22, 1956 Today's Date: 12/14/2020 Time: SLP Start Time (ACUTE ONLY): 1315 -SLP Stop Time (ACUTE ONLY): 1335 SLP Time Calculation (min) (ACUTE ONLY): 20 min Past Medical History: No past medical history on file. Past Surgical History: Past Surgical History: Procedure Laterality Date . APPENDECTOMY   . gastric ulcer surgery   . IR CT HEAD LTD  12/13/2020 . IR INTRAVSC STENT CERV CAROTID W/EMB-PROT MOD SED INCL ANGIO  12/14/2020 . IR PERCUTANEOUS ART THROMBECTOMY/INFUSION INTRACRANIAL INC DIAG ANGIO  12/13/2020 . IR US GUIDE VASC ACCESS RIGHT  12/13/2020 . RADIOLOGY WITH ANESTHESIA N/A 12/13/2020  Procedure: IR WITH ANESTHESIA;  Surgeon: Luanne Bras,  MD;  Location: [facility];  Service: Radiology;  Laterality: N/A; HPI: 65 y.o. male with past medical history significant for ongoing tobacco abuse, alcohol use and drug use (marijuana and possibly other substances) per EMS report per bystander report. 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 Small areas of acute infarct in the frontal lobes bilaterally. Pt s/p successful mechanical thrombectomy of the proximal left M2's/MCA middle division occlusion.  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 Right sided oral motor deficits from CVA allowed for frequent right sided anterior spillage, reduced bolus cohesion, right  sided oral residuals, and premature spillage of bolus. Pharyngeally pt with reduced timing and efficiency of laryngeal vestibule closure with an instance of trace transient penetration of thins and one instance of trace silent aspiration of thins by cup on initial series. Subsequent trials of thins via cup and straw were without penetration or aspiration (including mixed consistency with barium tablet series). Mild vallecular and pyriform sinsus residuasl noted with POs, clearing with reflexive swallows. Recommend dysphagia 3 (mechanical soft) and thin liquids with meds in puree. SLP to follow up. SLP Visit Diagnosis Dysphagia, oropharyngeal phase (R13.12) Attention and concentration deficit following -- Frontal lobe and executive function deficit following -- Impact on safety and function Mild aspiration risk;Moderate aspiration risk   CHL IP TREATMENT RECOMMENDATION 12/14/2020 Treatment Recommendations Therapy as outlined in treatment plan below   Prognosis 12/14/2020 Prognosis for Safe Diet Advancement Good Barriers to Reach Goals Time post onset  Barriers/Prognosis Comment -- [facility] IP DIET RECOMMENDATION 12/14/2020 SLP Diet Recommendations Dysphagia 3 (Mech soft) solids;Thin liquid Liquid Administration via Cup;Straw Medication Administration Whole meds with puree Compensations Minimize environmental distractions;Slow rate;Small sips/bites;Lingual sweep for clearance of pocketing;Clear throat intermittently Postural Changes Remain semi-upright after after feeds/meals (Comment);Seated upright at 90 degrees   CHL IP OTHER RECOMMENDATIONS 12/14/2020 Recommended Consults -- Oral Care Recommendations Oral care BID Other Recommendations --   CH[facility] IP FOLLOW UP RECOMMENDATIONS 12/14/2020 Follow up Recommendations Inpatient Rehab   CHL IP FREQUENCY AND DURATION 12/14/2020 Speech Therapy Frequency (ACUTE ONLY) min 2x/week Treatment Duration 2 weeks      CHL IP ORAL PHASE 12/14/2020 Oral Phase Impaired Oral - Pudding Teaspoon -- Oral - Pudding Cup -- Oral - Honey Teaspoon -- Oral - Honey Cup -- Oral - Nectar Teaspoon -- Oral - Nectar Cup Right pocketing in lateral sulci;Lingual/palatal residue;Right anterior bolus loss Oral - Nectar Straw -- Oral - Thin Teaspoon -- Oral - Thin Cup Lingual/palatal residue;Right anterior bolus loss;Right pocketing in lateral sulci Oral - Thin Straw Right anterior bolus loss;Lingual/palatal residue Oral - Puree Lingual/palatal residue;Right pocketing in lateral sulci;Delayed oral transit Oral - Mech Soft Right pocketing in lateral sulci;Lingual/palatal residue;Decreased bolus cohesion;Delayed oral transit Oral - Regular -- Oral - Multi-Consistency -- Oral - Pill Lingual/palatal residue;Right anterior bolus loss;Delayed oral transit Oral Phase - Comment --  CHL IP PHARYNGEAL PHASE 12/14/2020 Pharyngeal Phase Impaired Pharyngeal- Pudding Teaspoon -- Pharyngeal -- Pharyngeal- Pudding Cup -- Pharyngeal -- Pharyngeal- Honey Teaspoon -- Pharyngeal -- Pharyngeal- Honey Cup -- Pharyngeal -- Pharyngeal- Nectar Teaspoon -- Pharyngeal -- Pharyngeal- Nectar Cup  Delayed swallow initiation-vallecula;Pharyngeal residue - pyriform;Pharyngeal residue - valleculae;Reduced laryngeal elevation;Reduced tongue base retraction Pharyngeal -- Pharyngeal- Nectar Straw -- Pharyngeal -- Pharyngeal- Thin Teaspoon -- Pharyngeal -- Pharyngeal- Thin Cup Reduced laryngeal elevation;Reduced tongue base retraction;Pharyngeal residue - valleculae;Pharyngeal residue - pyriform;Penetration/Aspiration before swallow;Penetration/Aspiration during swallow;Reduced epiglottic inversion;Reduced airway/laryngeal closure;Delayed swallow initiation-pyriform sinuses Pharyngeal Material enters airway, passes BELOW cords without attempt by patient to eject out (silent aspiration);Material enters airway, remains ABOVE vocal cords and not ejected out;Material does not enter airway Pharyngeal- Thin Straw Pharyngeal residue - valleculae;Pharyngeal residue - pyriform;Delayed swallow initiation-pyriform sinuses;Reduced tongue base retraction;Reduced laryngeal elevation Pharyngeal -- Pharyngeal- Puree Reduced tongue base retraction;Reduced laryngeal elevation;Delayed swallow initiation-vallecula;Pharyngeal residue - valleculae;Pharyngeal residue - pyriform Pharyngeal -- Pharyngeal- Mechanical Soft Delayed swallow initiation-vallecula;Reduced laryngeal elevation;Reduced tongue base retraction;Pharyngeal residue - valleculae;Pharyngeal residue - pyriform Pharyngeal -- Pharyngeal- Regular -- Pharyngeal -- Pharyngeal- Multi-consistency -- Pharyngeal -- Pharyngeal- Pill Pharyngeal residue - pyriform;Pharyngeal residue - valleculae;Reduced tongue base retraction;Reduced laryngeal elevation Pharyngeal --  Pharyngeal Comment --  CHL IP CERVICAL ESOPHAGEAL PHASE 12/14/2020 Cervical Esophageal Phase WFL Pudding Teaspoon -- Pudding Cup -- Honey Teaspoon -- Honey Cup -- Nectar Teaspoon -- Nectar Cup -- Nectar Straw -- Thin Teaspoon -- Thin Cup -- Thin Straw -- Puree -- Mechanical Soft -- Regular -- Multi-consistency -- Pill --  Cervical Esophageal Comment -- Hayden Rasmussen MA, CCC-SLP Acute Rehabilitation Services 12/14/2020, 2:09 PM               ECHOCARDIOGRAM COMPLETE   Result Date: 12/13/2020    ECHOCARDIOGRAM REPORT   Patient Name:   Dustin Golden Date of Exam: 12/13/2020 Medical Rec #:  [id number]    Height:       76.0 in Accession #:    [id number]   Weight:       194.7 lb Date of Birth:  Nov 27, 1955    BSA:          2.190 m Patient Age:    55 years     BP:           113/65 mmHg Patient Gender: M            HR:           68 bpm. Exam Location:  Inpatient Procedure: 2D Echo, Cardiac Doppler, Color Doppler and Intracardiac            Opacification Agent Indications:    Stroke  History:        Patient has no prior history of Echocardiogram examinations.                 Stroke; Risk Factors:Current Smoker. ETOH.  Sonographer:    Roseanna Rainbow RDCS Referring Phys: [id number] [place]  Sonographer Comments: Image acquisition challenging due to respiratory motion. Images extremely respiratory. IMPRESSIONS  1. Left ventricular ejection fraction, by estimation, is 35 to 40%. The left ventricle has moderately decreased function. The left ventricle demonstrates global hypokinesis. There is moderate concentric left ventricular hypertrophy. Left ventricular diastolic parameters are consistent with Grade I diastolic dysfunction (impaired relaxation).  2. Right ventricular systolic function is mildly reduced. The right ventricular size is mildly enlarged. There is normal pulmonary artery systolic pressure. The estimated right ventricular systolic pressure is 25.0 mmHg.  3. The mitral valve is normal in structure. Trivial mitral valve regurgitation. No evidence of mitral stenosis. Moderate mitral annular calcification.  4. The aortic valve is normal in structure. Aortic valve regurgitation is not visualized. No aortic stenosis is present.  5. The inferior vena cava is normal in size with greater than 50% respiratory variability, suggesting right atrial  pressure of 3 mmHg. FINDINGS  Left Ventricle: Left ventricular ejection fraction, by estimation, is 35 to 40%. The left ventricle has moderately decreased function. The left ventricle demonstrates global hypokinesis. Definity contrast agent was given IV to delineate the left ventricular endocardial borders. The left ventricular internal cavity size was normal in size. There is moderate concentric left ventricular hypertrophy. Left ventricular diastolic parameters are consistent with Grade I diastolic dysfunction (impaired relaxation). Normal left ventricular filling pressure. Right Ventricle: The right ventricular size is mildly enlarged. No increase in right ventricular wall thickness. Right ventricular systolic function is mildly reduced. There is normal pulmonary artery systolic pressure. The tricuspid regurgitant velocity  is 2.00 m/s, and with an assumed right atrial pressure of 3 mmHg, the estimated right ventricular systolic pressure is 03.7 mmHg. Left Atrium: Left atrial size was normal in size. Right Atrium: Right atrial size  was normal in size. Pericardium: There is no evidence of pericardial effusion. Mitral Valve: The mitral valve is normal in structure. There is mild thickening of the mitral valve leaflet(s). Moderate mitral annular calcification. Trivial mitral valve regurgitation. No evidence of mitral valve stenosis. Tricuspid Valve: The tricuspid valve is normal in structure. Tricuspid valve regurgitation is mild . No evidence of tricuspid stenosis. Aortic Valve: The aortic valve is normal in structure. Aortic valve regurgitation is not visualized. No aortic stenosis is present. Pulmonic Valve: The pulmonic valve was normal in structure. Pulmonic valve regurgitation is not visualized. No evidence of pulmonic stenosis. Aorta: The aortic root is normal in size and structure. Venous: The inferior vena cava is normal in size with greater than 50% respiratory variability, suggesting right atrial pressure  of 3 mmHg. IAS/Shunts: No atrial level shunt detected by color flow Doppler.  LEFT VENTRICLE PLAX 2D LVIDd:         5.30 cm      Diastology LVIDs:         4.50 cm      LV e' medial:    5.22 cm/s LV PW:         1.60 cm      LV E/e' medial:  11.4 LV IVS:        1.60 cm      LV e' lateral:   5.92 cm/s LVOT diam:     1.80 cm      LV E/e' lateral: 10.1 LV SV:         42 LV SV Index:   19 LVOT Area:     2.54 cm  LV Volumes (MOD) LV vol d, MOD A2C: 115.0 ml LV vol d, MOD A4C: 142.0 ml LV vol s, MOD A2C: 66.8 ml LV vol s, MOD A4C: 105.0 ml LV SV MOD A2C:     48.2 ml LV SV MOD A4C:     142.0 ml LV SV MOD BP:      44.8 ml RIGHT VENTRICLE            IVC RV S prime:     7.79 cm/s  IVC diam: 2.40 cm TAPSE (M-mode): 1.6 cm LEFT ATRIUM             Index       RIGHT ATRIUM           Index LA diam:        3.10 cm 1.42 cm/m  RA Area:     17.30 cm LA Vol (A2C):   39.6 ml 18.08 ml/m RA Volume:   50.40 ml  23.01 ml/m LA Vol (A4C):   44.2 ml 20.18 ml/m LA Biplane Vol: 44.3 ml 20.23 ml/m  AORTIC VALVE LVOT Vmax:   90.20 cm/s LVOT Vmean:  62.700 cm/s LVOT VTI:    0.167 m  AORTA Ao Root diam: 3.40 cm MITRAL VALVE               TRICUSPID VALVE MV Area (PHT): 3.21 cm    TR Peak grad:   16.0 mmHg MV Decel Time: 236 msec    TR Vmax:        200.00 cm/s MV E velocity: 59.70 cm/s MV A velocity: 82.00 cm/s  SHUNTS MV E/A ratio:  0.73        Systemic VTI:  0.17 m  Systemic Diam: 1.80 cm Ena Dawley MD Electronically signed by Ena Dawley MD Signature Date/Time: 12/13/2020/1:53:15 PM    Final          Assessment/Plan: Diagnosis: Left ICA and left MCA/M2 stroke  1. Does the need for close, 24 hr/day medical supervision in concert with the patient's rehab needs make it unreasonable for this patient to be served in a less intensive setting? Yes 2. Co-Morbidities requiring supervision/potential complications:  1. History of tobacco abuse 2. Expressive aphasia: he will require intensive SLP 3. Right sided  weakness: he will require intensive PT and OT 4. Uncontrolled HTN: continue amlodipine 10m 5. Dysphagia: s/p MBS: D3, thins 3. Due to bladder management, bowel management, safety, skin/wound care, disease management, medication administration, pain management and patient education, does the patient require 24 hr/day rehab nursing? Yes 4. Does the patient require coordinated care of a physician, rehab nurse, therapy disciplines of PT, OT, SLP to address physical and functional deficits in the context of the above medical diagnosis(es)? Yes Addressing deficits in the following areas: balance, endurance, locomotion, strength, transferring, bowel/bladder control, bathing, dressing, feeding, grooming, toileting, cognition, speech, language, swallowing and psychosocial support 5. Can the patient actively participate in an intensive therapy program of at least 3 hrs of therapy per day at least 5 days per week? Yes 6. The potential for patient to make measurable gains while on inpatient rehab is excellent 7. Anticipated functional outcomes upon discharge from inpatient rehab are modified independent  with PT, modified independent with OT, modified independent with SLP. 8. Estimated rehab length of stay to reach the above functional goals is: 2-3 weeks 9. Anticipated discharge destination: Home 10. Overall Rehab/Functional Prognosis: excellent   RECOMMENDATIONS: This patient's condition is appropriate for continued rehabilitative care in the following setting: CIR Patient has agreed to participate in recommended program. Yes Note that insurance prior authorization may be required for reimbursement for recommended care.   Comment: Thank you for this consult. Admission coordinator to follow.    I have personally performed a face to face diagnostic evaluation, including, but not limited to relevant history and physical exam findings, of this patient and developed relevant assessment and plan.  Additionally,  I have reviewed and concur with the physician assistant's documentation above.   KLeeroy Cha MD   DLavon PaganiniAAdell PA-C 12/15/2020

## 2020-12-17 NOTE — Progress Notes (Signed)
Patient transferred from 3W. Patient feels that he should be off full supervision since he was not on full supervision on 3W. Sanda Linger, LPN

## 2020-12-17 NOTE — Progress Notes (Signed)
Physical Therapy Treatment Patient Details Name: DENYM RAHIMI MRN: [id number] DOB: [birth year] Today's Date: 12/17/2020    History of Present Illness 65 year old male with past medical history significant for polysubstance abuse who presented as code stroke with aphasia and found to have occlusion of the left ICA.  Taken for thrombectomy left ICA at bulb and proximal left MCA M2 middle division occlusions, along with stent placement of left ICA at bulb stenosis, achieving a TICI 3 revascularization via right femoral approach 12/13/2020. MRI + Acute infarct left posterior MCA distribution involving the posterior insula in the left frontal parietal cortex. Small areas of acute infarct in the frontal lobes bilaterally.    PT Comments    Pt tolerates treatment well, ambulating for increased distances with less physical assistance. Pt has difficulty negotiating stairs due to RLE coordination deficits with ataxic movement resulting in loss of balance. Pt will continue to benefit from aggressive mobilization and acute PT services to improve mobility quality and reduce falls risk. PT recommends CIR placement at this time.   Follow Up Recommendations  CIR     Equipment Recommendations  Cane    Recommendations for Other Services       Precautions / Restrictions Precautions Precautions: Fall Precaution Comments: fall during admission Restrictions Weight Bearing Restrictions: No    Mobility  Bed Mobility Overal bed mobility: Needs Assistance Bed Mobility: Sit to Supine       Sit to supine: Modified independent (Device/Increase time)        Transfers Overall transfer level: Needs assistance Equipment used: None Transfers: Sit to/from Stand Sit to Stand: Supervision            Ambulation/Gait Ambulation/Gait assistance: Min guard Gait Distance (Feet): 200 Feet Assistive device: None Gait Pattern/deviations: Step-through pattern Gait velocity: reduced Gait velocity  interpretation: <1.8 ft/sec, indicate of risk for recurrent falls General Gait Details: pt tends to drift to right, reduced stance time on LLE with increased trunk lean to left   Stairs Stairs: Yes Stairs assistance: Min assist Stair Management: No rails;Forwards;Step to pattern (2 steps x 2 trials) Number of Stairs: 2     Wheelchair Mobility    Modified Rankin (Stroke Patients Only) Modified Rankin (Stroke Patients Only) Pre-Morbid Rankin Score: No symptoms Modified Rankin: Moderately severe disability     Balance Overall balance assessment: Needs assistance Sitting-balance support: No upper extremity supported;Feet supported Sitting balance-Leahy Scale: Good     Standing balance support: No upper extremity supported;During functional activity Standing balance-Leahy Scale: Good Standing balance comment: pt able to stand and urinate at commode without UE support                            Cognition Arousal/Alertness: Awake/alert Behavior During Therapy: WFL for tasks assessed/performed Overall Cognitive Status: Impaired/Different from baseline Area of Impairment: Safety/judgement;Awareness                         Safety/Judgement: Decreased awareness of safety Awareness: Emergent          Exercises      General Comments General comments (skin integrity, edema, etc.): VSS on RA      Pertinent Vitals/Pain Pain Assessment: No/denies pain    Home Living                      Prior Function            PT Goals (  current goals can now be found in the care plan section) Acute Rehab PT Goals Patient Stated Goal: return to baseline Progress towards PT goals: Progressing toward goals    Frequency    Min 4X/week      PT Plan Current plan remains appropriate    Co-evaluation              AM-PAC PT "6 Clicks" Mobility   Outcome Measure  Help needed turning from your back to your side while in a flat bed without  using bedrails?: A Little Help needed moving from lying on your back to sitting on the side of a flat bed without using bedrails?: A Little Help needed moving to and from a bed to a chair (including a wheelchair)?: A Little Help needed standing up from a chair using your arms (e.g., wheelchair or bedside chair)?: A Little Help needed to walk in hospital room?: A Little Help needed climbing 3-5 steps with a railing? : A Little 6 Click Score: 18    End of Session   Activity Tolerance: Patient tolerated treatment well Patient left: in bed;with call bell/phone within reach;with bed alarm set Nurse Communication: Mobility status PT Visit Diagnosis: Hemiplegia and hemiparesis Hemiplegia - Right/Left: Right Hemiplegia - dominant/non-dominant: Dominant Hemiplegia - caused by: Cerebral infarction     Time: 3300-7622 PT Time Calculation (min) (ACUTE ONLY): 15 min  Charges:  $Gait Training: 8-22 mins                     Zenaida Niece, PT, DPT Acute Rehabilitation Pager: [place] 12/17/2020, 2:02 PM

## 2020-12-17 NOTE — Progress Notes (Signed)
Inpatient Rehab Admissions Coordinator:  There is a bed in CIR available for pt to admit today. Dr. Leonie Man and Charlene Brooke made aware and in agreement. NSG, TOC, and pt made aware.    Gayland Curry, [place], [place] Admissions Coordinator [phone]

## 2020-12-17 NOTE — Progress Notes (Signed)
Patients stated the simvastatin ( Zocor 40 mg ) makes him sick therefore want it change.

## 2020-12-18 LAB — GLUCOSE, CAPILLARY: Glucose-Capillary: 122 mg/dL — ABNORMAL HIGH (ref 70–99)

## 2020-12-18 MED ORDER — TOBRAMYCIN 0.3 % OP SOLN
2.0000 [drp] | Freq: Four times a day (QID) | OPHTHALMIC | Status: DC
  Administered 2020-12-18 – 2020-12-28 (×39): 2 [drp] via OPHTHALMIC
  Filled 2020-12-18: qty 5

## 2020-12-18 NOTE — Evaluation (Signed)
Physical Therapy Assessment and Plan  Patient Details  Name: Dustin Golden MRN: [id number] Date of Birth: 1955-12-22  PT Diagnosis: Hemiparesis dominant Rehab Potential: Good ELOS: 14 days   Today's Date: 12/18/2020 PT Individual Time: 7939-0300 PT Individual Time Calculation (min): 75 min    Hospital Problem: Active Problems:   Left middle cerebral artery stroke [facility])   Past Medical History:  Past Medical History:  Diagnosis Date  . Hypertension    Past Surgical History:  Past Surgical History:  Procedure Laterality Date  . APPENDECTOMY    . gastric ulcer surgery    . IR CT HEAD LTD  12/13/2020  . IR INTRAVSC STENT CERV CAROTID W/EMB-PROT MOD SED INCL ANGIO  12/14/2020  . IR PERCUTANEOUS ART THROMBECTOMY/INFUSION INTRACRANIAL INC DIAG ANGIO  12/13/2020  . IR US GUIDE VASC ACCESS RIGHT  12/13/2020  . RADIOLOGY WITH ANESTHESIA N/A 12/13/2020   Procedure: IR WITH ANESTHESIA;  Surgeon: Luanne Bras, MD;  Location: [place];  Service: Radiology;  Laterality: N/A;    Assessment & Plan Clinical Impression: B. Radford is a 65 year old right-handed male with history of tobacco and alcohol use and marijuana on no prescription medications.  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  1 level home.  Presented 12/13/2020 with acute onset of right-sided weakness and aphasia.  Admission chemistries unremarkable except glucose 117 creatinine 1.28 alcohol negative, urine drug screen positive cocaine as well as marijuana.  Cranial CT scan negative.  Patient did not receive TPA.  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  30 to 50% stenosis of both vertebral artery origins.  Patient underwent emergent mechanical thrombectomy per interventional radiology.  MRI/MRA showed acute infarction left posterior MCA distribution involving the  posterior insula and the left frontal parietal cortex.  Small areas of acute infarction in the frontal lobes bilaterally.  MRA demonstrated loss of signal in the right posterior cerebral artery.  Echocardiogram with ejection fraction of 35 to 92% grade 1 diastolic dysfunction.  Neurology follow-up currently maintained on aspirin as well as Brilinta for CVA prophylaxis.  Maintain on a mechanical soft thin liquid diet.  Patient did have a reported fall 12/15/2020 sustaining small abrasion on the right knee and large right facial scalp hematoma patient remained awake alert and interactive unchanged from baseline with follow-up per neurology services and received follow-up cranial CT scan after a fall redemonstrating moderate to large acute early subacute cortical and subcortical left MCA infarct no acute changes. Therapy evaluations completed due to patient's right side weakness and aphasia was admitted for a comprehensive rehab program. Patient transferred to CIR on 12/17/2020 .   Patient currently requires min with mobility secondary to muscle weakness, decreased cardiorespiratoy endurance, impaired timing and sequencing, abnormal tone, unbalanced muscle activation, motor apraxia, ataxia, decreased coordination and decreased motor planning, decreased visual perceptual skills and inattention vs visual field cut and decreased attention to right.  Prior to hospitalization, patient was independent  with mobility and lived with Alone in a House home.  Home access is 1 .step to enter, laundry in basement.  Patient will benefit from skilled PT intervention to maximize safe functional mobility, minimize fall risk and decrease caregiver burden for planned discharge home with 24 hour supervision.  Anticipate patient will benefit from follow up OP at discharge.  PT - End of Session  Activity Tolerance: Tolerates 30+ min activity with multiple rests PT Assessment Rehab Potential (ACUTE/IP ONLY): Good PT Barriers to  Discharge: Other (comments) PT Barriers to Discharge Comments: R inattention, apraxia PT Patient demonstrates impairments in the following area(s): Balance;Behavior;Endurance;Motor;Perception;Safety;Sensory PT Transfers Functional Problem(s): Bed Mobility;Bed to Chair;Car;Furniture;Floor PT Locomotion Functional Problem(s): Ambulation;Stairs;Wheelchair Mobility PT Plan PT Intensity: Minimum of 1-2 x/day ,45 to 90 minutes PT Frequency: 5 out of 7 days PT Duration Estimated Length of Stay: 14 days PT Treatment/Interventions: Ambulation/gait training;DME/adaptive equipment instruction;Neuromuscular re-education;Stair training;UE/LE Strength taining/ROM;Wheelchair propulsion/positioning;Balance/vestibular training;Discharge planning;Therapeutic Activities;UE/LE Coordination activities;Cognitive remediation/compensation;Functional mobility training;Patient/family education;Therapeutic Exercise;Visual/perceptual remediation/compensation;Psychosocial support PT Transfers Anticipated Outcome(s): supervision PT Locomotion Anticipated Outcome(s): supervision PT Recommendation Recommendations for Other Services: Speech consult Follow Up Recommendations: Outpatient PT Patient destination: Home Equipment Recommended: To be determined Equipment Details: has none   PT Evaluation Precautions/Restrictions Precautions Precautions: Fall Precaution Comments: MILDLY impulsive, decreased safety awarenness, R inattention Restrictions Weight Bearing Restrictions: No General   Vital SignsTherapy Vitals Pulse Rate: 100 BP: (!) 150/71 Pain Pain Assessment Pain Scale: 0-10 Pain Score: 0-No pain Home Living/Prior Functioning Home Living Available Help at Discharge: Family;Available 24 hours/day Type of Home: House Entrance Stairs-Number of Steps: 1 Home Layout: One level;Other (Comment);Laundry or work area in basement [employer] Shower/Tub: Product/process development scientist: Conservator, museum/gallery: Yes  Lives With: Alone Prior Function Level of Independence: Independent with basic ADLs;Independent with homemaking with ambulation;Independent with gait;Independent with transfers Vocation: Part time employment Vision/Perception     Cognition Overall Cognitive Status: Impaired/Different from baseline Arousal/Alertness: Awake/alert Orientation Level: Oriented X4 Attention: Selective Selective Attention: Appears intact Alternating Attention: Impaired Memory: Appears intact Memory Impairment: Decreased recall of new information;Decreased short term memory Problem Solving: Appears intact Safety/Judgment: Appears intact Sensation Sensation Light Touch: Impaired by gross assessment (reports dulled R vs L but able to detect) Proprioception: Impaired by gross assessment (somewhat confonded by ability to follow testing/apraxia?) Coordination Gross Motor Movements are Fluid and Coordinated: No Fine Motor Movements are Fluid and Coordinated: No Coordination and Movement Description: poor motor control/coordination RUE and RLE Finger Nose Finger Test: cannot perform safely Heel Shin Test: poor coordination, apraxia w/testing Motor  Motor Motor: Hemiplegia;Motor apraxia;Abnormal tone   Trunk/Postural Assessment  Cervical Assessment Cervical Assessment: Within Functional Limits Thoracic Assessment Thoracic Assessment: Within Functional Limits Lumbar Assessment Lumbar Assessment: Within Functional Limits  Balance Balance Balance Assessed: Yes Dynamic Sitting Balance Sitting balance - Comments: supervision, inattention to R limb positioning Extremity Assessment  RUE Assessment Passive Range of Motion (PROM) Comments: wfl Active Range of Motion (AROM) Comments: wfl but poorly controlled, attention to task required General Strength Comments: WFL LUE Assessment LUE Assessment: Within Functional Limits RLE Assessment RLE Assessment: Within Functional Limits General  Strength Comments: poorly controlled but WFL to testing    Care Tool Care Tool Bed Mobility Roll left and right activity   Roll left and right assist level: Supervision/Verbal cueing    Sit to lying activity   Sit to lying assist level: Supervision/Verbal cueing    Lying to sitting edge of bed activity   Lying to sitting edge of bed assist level: Supervision/Verbal cueing     Care Tool Transfers Sit to stand transfer   Sit to stand assist level: Contact Guard/Touching assist    Chair/bed transfer   Chair/bed transfer assist level: Minimal Assistance - Patient > 75%     Toilet transfer        Car transfer   Car transfer assist level: Minimal Assistance - Patient > 75%  Care Tool Locomotion Ambulation   Assist level: Minimal Assistance - Patient > 75% Assistive device: No Device Max distance: 150  Walk 10 feet activity   Assist level: Minimal Assistance - Patient > 75% Assistive device: No Device   Walk 50 feet with 2 turns activity   Assist level: Minimal Assistance - Patient > 75% Assistive device: No Device  Walk 150 feet activity   Assist level: Minimal Assistance - Patient > 75% Assistive device: No Device  Walk 10 feet on uneven surfaces activity   Assist level: Minimal Assistance - Patient > 75% Assistive device: Hand held assist  Stairs   Assist level: Minimal Assistance - Patient > 75% Stairs assistive device: 1 hand rail Max number of stairs: 8  Walk up/down 1 step activity   Walk up/down 1 step (curb) assist level: Minimal Assistance - Patient > 75% Walk up/down 1 step or curb assistive device: 1 hand rail    Walk up/down 4 steps activity Walk up/down 4 steps assist level: Minimal Assistance - Patient > 75% Walk up/down 4 steps assistive device: 1 hand rail  Walk up/down 12 steps activity Walk up/down 12 steps activity did not occur: Safety/medical concerns      Pick up small objects from floor   Pick up small object from the floor assist  level: Moderate Assistance - Patient 50 - 74% Pick up small object from the floor assistive device: no device  Wheelchair Will patient use wheelchair at discharge?: No          Wheel 50 feet with 2 turns activity      Wheel 150 feet activity        Refer to Care Plan for Long Term Goals  SHORT TERM GOAL WEEK 1 PT Short Term Goal 1 (Week 1): Pt will transfer bed to/from chair w/supervision PT Short Term Goal 2 (Week 1): Pt will ambulate >259f on level surfaces w/cga PT Short Term Goal 3 (Week 1): Pt will perform simulated household ambulation w/cga and cues <50% time to attend to obstacles/R environment PT Short Term Goal 4 (Week 1): Pt will demonstrate controlled R hand placement during sit to stand and transfers w/cues <50% time needed.  Recommendations for other services: None   Skilled Therapeutic Intervention  Evaluation completed (see details above and below) with education on PT POC and goals and individual treatment initiated with focus on functional mobility/transfers, LE strength, dynamic standing balance/coordination, ambulation, stair navigation, simulated car transfers, and improved endurance with activity Pt  oriented to rehab unit.  Discussed process of daily scheduling and receiving schedule, purpose  And team conference schedule and D/c planning.  Pt provided w/ 18x18  wheelchair w/custom back and contoured foam cushion and adjustments made to promote optimal seating posture and pressure distribution.  Pt also provided w/ walker for use in room w/nursing and therapist adjusted to proper height for patient.  Pt initially supine and agreeable to evaluation.  Declined f=dressing stating he wanted to wait to take a shower before putting on clean clothers.  Discussed OT scheduled for pm and pt agreeable.  Pt supervision w/all bed movility. Pt dons clean gown w/heavy cuing to attend to RUE and min assist for UE guidance due to poor coordination/apraxia.  Pt sits on edge of bed  w/supervision, mod assist to don shoes, difficulty coornating activity RUE and somewhat w/L. stand pivot transfer to wc w/min assist. Pt propels wc x 543fw/bilat LEs, attempted w/UEs but poor attention, apraxia, and poor coordination of  RUE makes this hazardous/risk of injury to hand. Pt performed gait 121f, 291fx 2 using no AD, min assist for safety/balance, cues to attend to R environment, see eval for gait deviations. Pt engaged in RUE coordination activities including RAMs, grasping and transferring various shaped objects into and out of container (bean bag, small balls, blocks), and pinch grasp using primarily RUE. At end of session, pt transported back to room.  Sit to stand and short distance gait to recliner w/min assist.  Pt left oob in recliner w/chair alarm set and needs in reach.    Mobility Bed Mobility Bed Mobility: Rolling Right;Rolling Left;Supine to Sit;Sit to Supine Rolling Right: Supervision/verbal cueing Rolling Left: Supervision/Verbal cueing Supine to Sit: Supervision/Verbal cueing Sit to Supine: Supervision/Verbal cueing Transfers Transfers: Sit to Stand;Stand to Sit;Stand Pivot Transfers Sit to Stand: Contact Guard/Touching assist Stand to Sit: Contact Guard/Touching assist (cues for safety/sequencing) Stand Pivot Transfers: Contact Guard/Touching assist (cues for safety, sequencin) Transfer (Assistive device): None Locomotion  Gait Ambulation: Yes Gait Distance (Feet): 150 Feet Assistive device: None Gait Gait: Yes Gait Pattern: Impaired Gait Pattern:  (mild RLE ataxia, inconsistent step length, wide base due to RLE abduction, mild R lean, decreased armswing R w/limb held in rigid somewhat abducted position) Gait velocity: reduced   Discharge Criteria: Patient will be discharged from PT if patient refuses treatment 3 consecutive times without medical reason, if treatment goals not met, if there is a change in medical status, if patient makes no progress  towards goals or if patient is discharged from hospital.  The above assessment, treatment plan, treatment alternatives and goals were discussed and mutually agreed upon: by patient  BaJerrilyn Cairo/02/2021, 12:23 PM

## 2020-12-18 NOTE — Progress Notes (Incomplete)
Physical Therapy Session Note  Patient Details  Name: Dustin Golden MRN: [id number] Date of Birth: 1956/02/08  Today's Date: 12/18/2020 PT Individual Time:  -      Short Term Goals: Week 1:    Week 2:    Week 3:     Skilled Therapeutic Interventions/Progress Updates:      Evaluation completed (see details above and below) with education on PT POC and goals and individual treatment initiated with focus on functional mobility/transfers, LE strength, dynamic standing balance/coordination, ambulation, stair navigation, simulated car transfers, and improved endurance with activity Pt *** oriented to rehab unit.  Discussed process of daily scheduling and receiving schedule, purpose  And team conference schedule and D/c planning.  Pt provided w/ *** wheelchair and cushion and adjustments made to promote optimal seating posture and pressure distribution.  Pt also provided w/ *** and therapist adjusted to proper height for patient.  Therapy/Group: Individual Therapy  Callie Fielding, [place] 12/18/2020, 7:35 AM

## 2020-12-18 NOTE — Progress Notes (Signed)
Patient ID: Dustin Golden, male   DOB: 22-Mar-1956, 65 y.o.   MRN: [id number] Met with the patient to review role of the nurse CM and address educational needs for management of secondary stroke risks including HTN, HLD smoker and polysubstance abuse, prediabetes with A1c of 6.8 and HF. Reviewed collaboration with the SW to facilitate preparation for discharge. Patient given handouts on DASH diet, smoking cessation and dyslipidemia with LDL of 109 with goal for <70 on statin. Nurse CM continue to follow along to discharge to address educational needs for management of secondary stroke risks. Margarito Liner

## 2020-12-18 NOTE — Progress Notes (Signed)
Occupational Therapy Assessment and Plan  Patient Details  Name: Dustin Golden MRN: [id number] Date of Birth: [birth year]  OT Diagnosis: abnormal posture, apraxia, cognitive deficits, disturbance of vision, hemiplegia affecting dominant side and muscle weakness (generalized) Rehab Potential: Rehab Potential (ACUTE ONLY): Good ELOS: 10-14   Today's Date: 12/18/2020 OT Individual Time: 1255-1400 OT Individual Time Calculation (min): 65 min     Hospital Problem: Active Problems:   Left middle cerebral artery stroke [facility])   Past Medical History:  Past Medical History:  Diagnosis Date  . Hypertension    Past Surgical History:  Past Surgical History:  Procedure Laterality Date  . APPENDECTOMY    . gastric ulcer surgery    . IR CT HEAD LTD  12/13/2020  . IR INTRAVSC STENT CERV CAROTID W/EMB-PROT MOD SED INCL ANGIO  12/14/2020  . IR PERCUTANEOUS ART THROMBECTOMY/INFUSION INTRACRANIAL INC DIAG ANGIO  12/13/2020  . IR US GUIDE VASC ACCESS RIGHT  12/13/2020  . RADIOLOGY WITH ANESTHESIA N/A 12/13/2020   Procedure: IR WITH ANESTHESIA;  Surgeon: Luanne Bras, MD;  Location: [place];  Service: Radiology;  Laterality: N/A;    Assessment & Plan Clinical Impression:  65 year old male with past medical history significant for polysubstance abuse who presented as code stroke with aphasia and found to have occlusion of the left ICA.  Taken for thrombectomy left ICA at bulb and proximal left MCA M2 middle division occlusions, along with stent placement of left ICA at bulb stenosis, achieving a TICI 3 revascularization via right femoral approach 12/13/2020. MRI + Acute infarct left posterior MCA distribution involving the posterior insula in the left frontal parietal cortex. Small areas of acute infarct in the frontal lobes bilaterally.  Patient currently requires mod with basic self-care skills secondary to muscle weakness, decreased cardiorespiratoy endurance, impaired timing and sequencing, abnormal tone,  unbalanced muscle activation, motor apraxia and decreased coordination, decreased visual perceptual skills, decreased attention to right, right side neglect and decreased motor planning, decreased memory and decreased standing balance, decreased postural control, hemiplegia and decreased balance strategies.  Prior to hospitalization, patient could complete BADL/IADL with independent .  Patient will benefit from skilled intervention to decrease level of assist with basic self-care skills and increase independence with basic self-care skills prior to discharge home with care partner.  Anticipate patient will require 24 hour supervision and follow up outpatient.  OT - End of Session Endurance Deficit: Yes OT Assessment Rehab Potential (ACUTE ONLY): Good OT Barriers to Discharge: Decreased caregiver support;Inaccessible home environment OT Patient demonstrates impairments in the following area(s): Balance;Cognition;Endurance;Motor;Perception;Safety;Sensory;Vision;Skin Integrity OT Basic ADL's Functional Problem(s): Grooming;Bathing;Dressing;Toileting;Eating OT Transfers Functional Problem(s): Toilet;Tub/Shower OT Additional Impairment(s): Fuctional Use of Upper Extremity OT Plan OT Intensity: Minimum of 1-2 x/day, 45 to 90 minutes OT Frequency: 5 out of 7 days OT Duration/Estimated Length of Stay: 10-14 OT Treatment/Interventions: Balance/vestibular training;Discharge planning;Functional electrical stimulation;Pain management;Self Care/advanced ADL retraining;Therapeutic Activities;UE/LE Coordination activities;Cognitive remediation/compensation;Disease mangement/prevention;Functional mobility training;Patient/family education;Skin care/wound managment;Therapeutic Exercise;Visual/perceptual remediation/compensation;Wheelchair propulsion/positioning;UE/LE Strength taining/ROM;Psychosocial support;Splinting/orthotics;Community Radiographer, therapeutic;Neuromuscular re-education OT  Self Feeding Anticipated Outcome(s): S OT Basic Self-Care Anticipated Outcome(s): S OT Toileting Anticipated Outcome(s): S OT Bathroom Transfers Anticipated Outcome(s): S OT Recommendation Patient destination: Home Follow Up Recommendations: Outpatient OT Equipment Recommended: 3 in 1 bedside comode;To be determined;Tub/shower bench   OT Evaluation Precautions/Restrictions    General   Vital Signs Therapy Vitals Pulse Rate: 100 BP: (!) 150/71 Pain Pain Assessment Pain Scale: 0-10 Pain Score: 0-No pain Home Living/Prior Functioning Home Living Family/patient expects to be discharged to::  Private residence Living Arrangements: Alone Available Help at Discharge: Family,Available 24 hours/day Type of Home: House Entrance Stairs-Number of Steps: 1 Home Layout: One level,Other (Comment),Laundry or work area in basement [employer] Shower/Tub: Engineer, petroleum: Programmer, systems: Yes  Lives With: Alone IADL History Education: associates degree Prior Function Level of Independence: Independent with basic ADLs,Independent with homemaking with ambulation,Independent with gait,Independent with transfers Vocation: Part time employment Vision   R eye swollen/bruised/red. Pt reporting can see out of it but impaired Perception    R inattention/neglect to body and space Praxis   impaired motor control, timing sequencing Cognition Overall Cognitive Status: Impaired/Different from baseline Arousal/Alertness: Awake/alert Orientation Level: Person;Place;Situation Person: Oriented Place: Oriented Situation: Oriented Year: 2022 Month: March Day of Week: Correct Memory: Appears intact Memory Impairment: Decreased recall of new information;Decreased short term memory Immediate Memory Recall: Sock;Blue;Bed Memory Recall Sock: Without Cue Memory Recall Blue: Without Cue Memory Recall Bed: With Cue Attention: Selective Selective Attention: Appears  intact Problem Solving: Appears intact Safety/Judgment: Appears intact Sensation Sensation Light Touch: Impaired by gross assessment Proprioception: Impaired by gross assessment Coordination Gross Motor Movements are Fluid and Coordinated: No Fine Motor Movements are Fluid and Coordinated: No Coordination and Movement Description: poor motor control/coordination RUE and RLE Finger Nose Finger Test: cannot perform safely Heel Shin Test: poor coordination, apraxia w/testing Motor    R hemiplegia Trunk/Postural Assessment  Cervical Assessment Cervical Assessment: Within Functional Limits Thoracic Assessment Thoracic Assessment: Within Functional Limits Lumbar Assessment Lumbar Assessment: Within Functional Limits  Balance Balance Balance Assessed: Yes Dynamic Sitting Balance Sitting balance - Comments: supervision, inattention to R limb positioning Extremity/Trunk Assessment RUE Assessment Passive Range of Motion (PROM) Comments: wfl Active Range of Motion (AROM) Comments: wfl but poorly controlled, attention to task required General Strength Comments: WFL LUE Assessment LUE Assessment: Within Functional Limits  Care Tool Care Tool Self Care Eating   Eating Assist Level: Set up assist    Oral Care    Oral Care Assist Level: Minimal Assistance - Patient > 75%    Bathing   Body parts bathed by patient: Left arm;Chest;Abdomen;Front perineal area;Right upper leg;Left upper leg;Face Body parts bathed by helper: Right arm;Buttocks;Right lower leg;Left lower leg        Upper Body Dressing(including orthotics)       Assist Level: Moderate Assistance - Patient 50 - 74%    Lower Body Dressing (excluding footwear)     Assist for lower body dressing: Moderate Assistance - Patient 50 - 74%    Putting on/Taking off footwear     Assist for footwear: Maximal Assistance - Patient 25 - 49%       Care Tool Toileting Toileting activity   Assist for toileting: Moderate  Assistance - Patient 50 - 74%     Care Tool Bed Mobility Roll left and right activity   Roll left and right assist level: Supervision/Verbal cueing    Sit to lying activity   Sit to lying assist level: Supervision/Verbal cueing    Lying to sitting edge of bed activity   Lying to sitting edge of bed assist level: Supervision/Verbal cueing     Care Tool Transfers Sit to stand transfer   Sit to stand assist level: Contact Guard/Touching assist    Chair/bed transfer   Chair/bed transfer assist level: Minimal Assistance - Patient > 75%     Toilet transfer   Assist Level: Minimal Assistance - Patient > 75%     Care Tool Cognition Expression of Ideas and  Wants Expression of Ideas and Wants: Some difficulty - exhibits some difficulty with expressing needs and ideas (e.g, some words or finishing thoughts) or speech is not clear   Understanding Verbal and Non-Verbal Content Understanding Verbal and Non-Verbal Content: Understands (complex and basic) - clear comprehension without cues or repetitions   Memory/Recall Ability *first 3 days only Memory/Recall Ability *first 3 days only: Current season;Staff names and faces;That he or she is in a hospital/hospital unit;Location of own room    Refer to Care Plan for Long Term Goals  SHORT TERM GOAL WEEK 1 OT Short Term Goal 1 (Week 1): Pt will transfer to toilet wiht CGA OT Short Term Goal 2 (Week 1): Pt will don B socks with S OT Short Term Goal 3 (Week 1): Pt will thread BLE into pants wiht S OT Short Term Goal 4 (Week 1): Pt will use RUE to complete oral care  Recommendations for other services: Therapeutic Recreation  Pet therapy and Stress management   Skilled Therapeutic Intervention 1:1. Pt received in recliner agreeable to OT after edu re OT role/purpose, CIR, ELOS, POC and CVA recovery. Pt completes BADL at shower level and transfers at ambulatory level with no AD required with MIN A. Pt with significant R inattention functionally  and significant praxis/motor planning deficits UE>LE impacting participation/performance/independence of BADLs. Pt requires HOH A to incorporate in RUE into ADLs. End of session pt seated in recliner call light in reach and all needs met.   ADL ADL Grooming: Minimal assistance Where Assessed-Grooming: Standing at sink Upper Body Bathing: Minimal assistance Where Assessed-Upper Body Bathing: Shower Lower Body Bathing: Moderate assistance Where Assessed-Lower Body Bathing: Shower Upper Body Dressing: Minimal assistance Where Assessed-Upper Body Dressing: Sitting at sink Lower Body Dressing: Maximal assistance Where Assessed-Lower Body Dressing: Sitting at sink;Standing at sink Social research officer, government: Minimal assistance Social research officer, government Method: Ambulating Mobility    MIN A ambulatory transfers without AD   Discharge Criteria: Patient will be discharged from OT if patient refuses treatment 3 consecutive times without medical reason, if treatment goals not met, if there is a change in medical status, if patient makes no progress towards goals or if patient is discharged from hospital.  The above assessment, treatment plan, treatment alternatives and goals were discussed and mutually agreed upon: by patient  Tonny Branch 12/18/2020, 12:58 PM

## 2020-12-18 NOTE — Plan of Care (Signed)
  Problem: Safety: Goal: Non-violent Restraint(s) Outcome: Progressing   Problem: Consults Goal: RH STROKE PATIENT EDUCATION Description: See Patient Education module for education specifics  Outcome: Progressing   Problem: RH SAFETY Goal: RH STG ADHERE TO SAFETY PRECAUTIONS W/ASSISTANCE/DEVICE Description: STG Adhere to Safety Precautions With cues/reminders Assistance/Device. Outcome: Progressing   Problem: RH PAIN MANAGEMENT Goal: RH STG PAIN MANAGED AT OR BELOW PT'S PAIN GOAL Description: At or below level 4 Outcome: Progressing   Problem: RH KNOWLEDGE DEFICIT Goal: RH STG INCREASE KNOWLEGDE OF HYPERLIPIDEMIA Description: Patient will be able to manage HLD with medications and dietary modifications using handouts and educational materials independently Outcome: Progressing Goal: RH STG INCREASE KNOWLEDGE OF STROKE PROPHYLAXIS Description: Patient will be able to manage secondary stroke with medications and dietary modifications using handouts and educational materials independently Outcome: Progressing

## 2020-12-18 NOTE — Progress Notes (Signed)
Inpatient Rehabilitation  Patient information reviewed and entered into eRehab system by Kenae Lindquist M. Loralie Malta, M.A., CCC/SLP, PPS Coordinator.  Information including medical coding, functional ability and quality indicators will be reviewed and updated through discharge.    

## 2020-12-18 NOTE — Evaluation (Signed)
Speech Language Pathology Assessment and Plan  Patient Details  Name: Dustin Golden MRN: [id number] Date of Birth: 1956-07-29  SLP Diagnosis: Dysarthria;Aphasia;Dysphagia;Cognitive Impairments  Rehab Potential: Excellent ELOS: 14-21 days    Today's Date: 12/18/2020 SLP Individual Time: 1607-3710 SLP Individual Time Calculation (min): 55 min   Hospital Problem: Active Problems:   Left middle cerebral artery stroke [facility])  Past Medical History:  Past Medical History:  Diagnosis Date  . Hypertension    Past Surgical History:  Past Surgical History:  Procedure Laterality Date  . APPENDECTOMY    . gastric ulcer surgery    . IR CT HEAD LTD  12/13/2020  . IR INTRAVSC STENT CERV CAROTID W/EMB-PROT MOD SED INCL ANGIO  12/14/2020  . IR PERCUTANEOUS ART THROMBECTOMY/INFUSION INTRACRANIAL INC DIAG ANGIO  12/13/2020  . IR US GUIDE VASC ACCESS RIGHT  12/13/2020  . RADIOLOGY WITH ANESTHESIA N/A 12/13/2020   Procedure: IR WITH ANESTHESIA;  Surgeon: Luanne Bras, MD;  [facility];  Service: Radiology;  Laterality: N/A;    Assessment / Plan / Recommendation Clinical Impression   Dustin Golden is a 65 year old right-handed male with history of tobacco and alcohol use and marijuana on no prescription medications.  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  1 level home.  Presented 12/13/2020 with acute onset of right-sided weakness and aphasia.  Admission chemistries unremarkable except glucose 117 creatinine 1.28 alcohol negative, urine drug screen positive cocaine as well as marijuana.  Cranial CT scan negative.  Patient did not receive TPA.  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  30 to 50% stenosis of both vertebral artery origins.  Patient underwent emergent mechanical thrombectomy per interventional radiology.  MRI/MRA  showed acute infarction left posterior MCA distribution involving the posterior insula and the left frontal parietal cortex.  Small areas of acute infarction in the frontal lobes bilaterally.  MRA demonstrated loss of signal in the right posterior cerebral artery.  Echocardiogram with ejection fraction of 35 to 62% grade 1 diastolic dysfunction.  Neurology follow-up currently maintained on aspirin as well as Brilinta for CVA prophylaxis.  Maintain on a mechanical soft thin liquid diet.  Patient did have a reported fall 12/15/2020 sustaining small abrasion on the right knee and large right facial scalp hematoma patient remained awake alert and interactive unchanged from baseline with follow-up per neurology services and received follow-up cranial CT scan after a fall redemonstrating moderate to large acute early subacute cortical and subcortical left MCA infarct no acute changes. Therapy evaluations completed due to patient's right side weakness and aphasia was admitted for a comprehensive rehab program. SLP evaluation was completed on 12/18/2020 with results as follows:  Cognitive-linguistic evaluation: Pt presents with a moderately severe dysarthria resulting from right sided oral motor weakness which leads to imprecise articulation of consonants with decreased speech intelligibility at the phrase/short sentence level.  Additionally, pt presents with a mild expressive aphasia characterized by word finding difficulty and paraphasic errors in conversations.  Overall, pt needs min assist for functional communication at this time.  SLP provided skilled education regarding speech intelligibility and word finding compensatory strategies.  Handout posted in pt's room to facilitate carryover in between therapy sessions.  Cognition was not formally assessed on this date given the focus on speech, language, and swallowing of today's assessment.  Informally pt  presented with questionable deficits in problem solving and safety  awareness which may be reflective of baseline personality rather than acute cognitive dysfunction.  Will defer implementation of cognitive goals until completion of assessment to ensure plan of care is appropriate.    Bedside swallow evaluation: Pt presents with a mild-moderate oral phase dysphagia resulting from right sided oral motor weakness which leads to impaired mastication of solid boluses with right sided buccal pocketing post swallow.  Pt is aware of pocketing and can implement strategies to clear buccal residue with supervision.  No overt s/s of aspiration were evident with solids or liquids.  Recommend that pt remain on his currently prescribed.  Prognosis for further diet advancement is good given his adherence to recommended swallowing precautions.    Given the abovementioned deficits, pt would benefit from skilled ST while inpatient in order to maximize functional independence and reduce burden of care prior to discharge.  Anticipate that pt will need 24/7 supervision in addition to [place] follow up at next level of care.     Skilled Therapeutic Interventions          Cognitive-linguistic evaluation completed with results and recommendations reviewed with family.     SLP Assessment  Patient will need skilled Speech Lanaguage Pathology Services during CIR admission    Recommendations  SLP Diet Recommendations: Dysphagia 3 (Mech soft);Thin Liquid Administration via: Cup;Straw Medication Administration: Whole meds with puree Supervision: Patient able to self feed;Intermittent supervision to cue for compensatory strategies Compensations: Lingual sweep for clearance of pocketing;Slow rate;Small sips/bites Postural Changes and/or Swallow Maneuvers: Out of bed for meals;Upright 30-60 min after meal;Seated upright 90 degrees Oral Care Recommendations: Oral care BID Recommendations for Other Services: Neuropsych consult Patient destination: Home Follow up Recommendations: Home Health SLP;24  hour supervision/assistance Equipment Recommended: None recommended by SLP    SLP Frequency 3 to 5 out of 7 days   SLP Duration  SLP Intensity  SLP Treatment/Interventions 14-21 days  Minumum of 1-2 x/day, 30 to 90 minutes  Cognitive remediation/compensation;Cueing hierarchy;Dysphagia/aspiration precaution training;Functional tasks;Internal/external aids;Patient/family education;Speech/Language facilitation    Pain Pain Assessment Pain Scale: 0-10 Pain Score: 0-No pain  Prior Functioning Cognitive/Linguistic Baseline: Within functional limits Type of Home: House  Lives With: Alone Available Help at Discharge: Family;Available 24 hours/day Education: associates degree Vocation: Part time employment  SLP Evaluation Cognition Overall Cognitive Status: Impaired/Different from baseline Arousal/Alertness: Awake/alert Orientation Level: Oriented X4 Attention: Selective Selective Attention: Appears intact Alternating Attention: Impaired Memory: Appears intact Memory Impairment: Decreased recall of new information;Decreased short term memory Problem Solving: Appears intact Safety/Judgment: Appears intact  Comprehension Auditory Comprehension Overall Auditory Comprehension: Appears within functional limits for tasks assessed Expression Expression Primary Mode of Expression: Verbal Verbal Expression Overall Verbal Expression: Impaired Initiation: No impairment Level of Generative/Spontaneous Verbalization: Sentence Repetition: Impaired Level of Impairment: Sentence level Naming: Impairment Responsive: 76-100% accurate Confrontation: Impaired Verbal Errors: Aware of errors Pragmatics: No impairment Written Expression Dominant Hand: Right Oral Motor Oral Motor/Sensory Function Overall Oral Motor/Sensory Function: Moderate impairment Facial ROM: Reduced right;Suspected CN VII (facial) dysfunction Facial Symmetry: Abnormal symmetry right;Suspected CN VII (facial)  dysfunction Facial Strength: Reduced right;Suspected CN VII (facial) dysfunction Facial Sensation: Reduced right;Suspected CN V (Trigeminal) dysfunction Lingual ROM: Reduced right;Suspected CN XII (hypoglossal) dysfunction Lingual Strength: Reduced Lingual Sensation: Reduced Motor Speech Overall Motor Speech: Impaired Respiration: Impaired Level of Impairment: Word Phonation: Low vocal intensity Resonance: Hypernasality Articulation: Impaired Level of Impairment: Phrase Intelligibility: Intelligibility reduced Word: 75-100% accurate Phrase: 75-100% accurate Sentence: 50-74% accurate Motor  Planning: Witnin functional limits Effective Techniques: Slow rate;Over-articulate;Pause  Care Tool Care Tool Cognition Expression of Ideas and Wants Expression of Ideas and Wants: Some difficulty - exhibits some difficulty with expressing needs and ideas (e.g, some words or finishing thoughts) or speech is not clear   Understanding Verbal and Non-Verbal Content Understanding Verbal and Non-Verbal Content: Understands (complex and basic) - clear comprehension without cues or repetitions   Memory/Recall Ability *first 3 days only Memory/Recall Ability *first 3 days only: Current season;Staff names and faces;That he or she is in a hospital/hospital unit;Location of own room     PMSV Assessment  PMSV Trial Intelligibility: Intelligibility reduced Word: 75-100% accurate Phrase: 75-100% accurate Sentence: 50-74% accurate  Bedside Swallowing Assessment General Date of Onset: 12/13/20 Previous Swallow Assessment: MBS 12/14/2020 Diet Prior to this Study: Dysphagia 3 (soft);Thin liquids Temperature Spikes Noted: No Respiratory Status: Room air History of Recent Intubation: Yes Length of Intubations (days): 1 days Date extubated: 12/13/20 Behavior/Cognition: Alert;Cooperative;Pleasant mood Oral Cavity - Dentition: Adequate natural dentition Self-Feeding Abilities: Needs assist Vision:  Functional for self-feeding Patient Positioning: Upright in chair/Tumbleform Baseline Vocal Quality: Normal Volitional Cough: Strong Volitional Swallow: Able to elicit  Oral Care Assessment   Ice Chips   Thin Liquid Thin Liquid: Within functional limits Nectar Thick   Honey Thick   Puree   Solid Solid: Impaired Oral Phase Functional Implications: Impaired mastication;Prolonged oral transit;Oral residue;Right lateral sulci pocketing BSE Assessment Risk for Aspiration Impact on safety and function: Mild aspiration risk;Moderate aspiration risk  Short Term Goals: Week 1: SLP Short Term Goal 1 (Week 1): Pt will use overarticulation, loud voice, and slow rate of speech with min assist verbal cues to achieve intelligibility at the sentence level in the presence of mild background noise or other physical/visual barriers. SLP Short Term Goal 2 (Week 1): Pt will use compensatory word finding stratgies during both structured and unstructured language tasks with supervision verbal cues. SLP Short Term Goal 3 (Week 1): Pt will consume dys 3 textures and thin liquids with mod I use of swallowing strategies and minimal overt s/s of aspiration. SLP Short Term Goal 4 (Week 1): Pt will consume therapeutic trials of regular textures with supervision cues for use of swallowing precautions and minimal overt s/s of aspiration.  Refer to Care Plan for Long Term Goals  Recommendations for other services: Neuropsych  Discharge Criteria: Patient will be discharged from SLP if patient refuses treatment 3 consecutive times without medical reason, if treatment goals not met, if there is a change in medical status, if patient makes no progress towards goals or if patient is discharged from hospital.  The above assessment, treatment plan, treatment alternatives and goals were discussed and mutually agreed upon: by patient  Emilio Math 12/18/2020, 12:30 PM

## 2020-12-19 DIAGNOSIS — I63512 Cerebral infarction due to unspecified occlusion or stenosis of left middle cerebral artery: Secondary | ICD-10-CM

## 2020-12-19 NOTE — Plan of Care (Signed)
  Problem: Safety: Goal: Non-violent Restraint(s) Outcome: Progressing   Problem: Consults Goal: RH STROKE PATIENT EDUCATION Description: See Patient Education module for education specifics  Outcome: Progressing   Problem: RH SAFETY Goal: RH STG ADHERE TO SAFETY PRECAUTIONS W/ASSISTANCE/DEVICE Description: STG Adhere to Safety Precautions With cues/reminders Assistance/Device. Outcome: Progressing   Problem: RH PAIN MANAGEMENT Goal: RH STG PAIN MANAGED AT OR BELOW PT'S PAIN GOAL Description: At or below level 4 Outcome: Progressing   Problem: RH KNOWLEDGE DEFICIT Goal: RH STG INCREASE KNOWLEGDE OF HYPERLIPIDEMIA Description: Patient will be able to manage HLD with medications and dietary modifications using handouts and educational materials independently Outcome: Progressing Goal: RH STG INCREASE KNOWLEDGE OF STROKE PROPHYLAXIS Description: Patient will be able to manage secondary stroke with medications and dietary modifications using handouts and educational materials independently Outcome: Progressing

## 2020-12-19 NOTE — Progress Notes (Signed)
PROGRESS NOTE   Subjective/Complaints:  Patient concerned about a black eye.  Right side.  Fell in ICU on 3/2 CT head was negative for subdural hematoma.  Objective:   No results found. No results for input(s): WBC, HGB, HCT, PLT in the last 72 hours. No results for input(s): NA, K, CL, CO2, GLUCOSE, BUN, CREATININE, CALCIUM in the last 72 hours.  Intake/Output Summary (Last 24 hours) at 12/19/2020 1054 Last data filed at 12/19/2020 1044 Gross per 24 hour  Intake 600 ml  Output 951 ml  Net -351 ml        Physical Exam: Vital Signs Blood pressure 121/77, pulse 71, temperature 97.8 F (36.6 C), temperature source Oral, resp. rate 20, height 6\' 4"  (1.93 m), weight 83.4 kg, SpO2 99 %.  HEENT right frontal scalp hematoma, periorbital ecchymosis on the right side, subconjunctival hemorrhage right eye General: No acute distress Mood and affect are appropriate Heart: Regular rate and rhythm no rubs murmurs or extra sounds Lungs: Clear to auscultation, breathing unlabored, no rales or wheezes Abdomen: Positive bowel sounds, soft nontender to palpation, nondistended Extremities: No clubbing, cyanosis, or edema Skin: No evidence of breakdown, no evidence of rash Neurologic: Cranial nerves II through XII intact, motor strength is 5/5 in left, 4/5 right deltoid, bicep, tricep, grip, hip flexor, knee extensors, ankle dorsiflexor and plantar flexor Sensory exam normal sensation to light touch and proprioception in bilateral upper and lower extremities Cerebellar exam mild dysmetria on the right side compared to the left side  Severe fine motor deficits on the right side, decreased motor control on the right side unable to gauge grasp musculoskeletal: Full range of motion in all 4 extremities. No joint swelling   Assessment/Plan: 1. Functional deficits which require 3+ hours per day of interdisciplinary therapy in a comprehensive  inpatient rehab setting.  Physiatrist is providing close team supervision and 24 hour management of active medical problems listed below.  Physiatrist and rehab team continue to assess barriers to discharge/monitor patient progress toward functional and medical goals  Care Tool:  Bathing    Body parts bathed by patient: Left arm,Chest,Abdomen,Front perineal area,Right upper leg,Left upper leg,Face   Body parts bathed by helper: Right arm,Buttocks,Right lower leg,Left lower leg     Bathing assist       Upper Body Dressing/Undressing Upper body dressing        Upper body assist Assist Level: Moderate Assistance - Patient 50 - 74%    Lower Body Dressing/Undressing Lower body dressing      What is the patient wearing?: Pants,Underwear/pull up     Lower body assist Assist for lower body dressing: Moderate Assistance - Patient 50 - 74%     Toileting Toileting    Toileting assist Assist for toileting: Moderate Assistance - Patient 50 - 74%     Transfers Chair/bed transfer  Transfers assist     Chair/bed transfer assist level: Minimal Assistance - Patient > 75%     Locomotion Ambulation   Ambulation assist      Assist level: Minimal Assistance - Patient > 75% Assistive device: No Device Max distance: 150   Walk 10 feet activity   Assist  Assist level: Minimal Assistance - Patient > 75% Assistive device: No Device   Walk 50 feet activity   Assist    Assist level: Minimal Assistance - Patient > 75% Assistive device: No Device    Walk 150 feet activity   Assist    Assist level: Minimal Assistance - Patient > 75% Assistive device: No Device    Walk 10 feet on uneven surface  activity   Assist     Assist level: Minimal Assistance - Patient > 75% Assistive device: Hand held assist   Wheelchair     Assist Will patient use wheelchair at discharge?: No             Wheelchair 50 feet with 2 turns  activity    Assist            Wheelchair 150 feet activity     Assist          Blood pressure 121/77, pulse 71, temperature 97.8 F (36.6 C), temperature source Oral, resp. rate 20, height 6\' 4"  (1.93 m), weight 83.4 kg, SpO2 99 %.  Medical Problem List and Plan: 1.  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  Status post mechanical thrombectomy             -patient may shower             -ELOS/Goals: modI in 5-7 days             -Continue CIR 2.  Antithrombotics: -DVT/anticoagulation: Continue SCDs             -antiplatelet therapy: Aspirin 81 mg daily and Brilinta 90 mg twice daily 3. Pain Management: Continue Tylenol as needed 4. Mood: Provide emotional support             -antipsychotic agents: N/A 5. Neuropsych: This patient is capable of making decisions on his own behalf. 6. Skin/Wound Care: Routine skin checks 7. Fluids/Electrolytes/Nutrition: Routine in and outs with follow-up chemistries 8.  Hypertension.  Norvasc 10 mg daily.  Monitor with increased mobility              Vitals:   12/18/20 2043 12/19/20 0333  BP: 108/77 121/77  Pulse: 69 71  Resp: 20 20  Temp: 97.7 F (36.5 C) 97.8 F (36.6 C)  SpO2: 98% 99%  Controlled 9.  History of alcohol tobacco as well as polysubstance abuse.  Provide counseling 10.  Hyperlipidemia.  Zocor 11. Mechanical fall 12/15/2020. Sustained small abrasion right knee and large right facial scalp hematoma. Follow-up CT scan unchanged #12.  Patient reported discharge from right eye yesterday morning, started on tobramycin ophthalmic drops.  Continue x7 days on a 4 times daily basis  LOS: 2 days A FACE TO Fowler E Bravery Ketcham 12/19/2020, 10:54 AM

## 2020-12-20 DIAGNOSIS — I63512 Cerebral infarction due to unspecified occlusion or stenosis of left middle cerebral artery: Secondary | ICD-10-CM | POA: Diagnosis not present

## 2020-12-20 LAB — CBC WITH DIFFERENTIAL/PLATELET
Abs Immature Granulocytes: 0.02 10*3/uL (ref 0.00–0.07)
Basophils Absolute: 0.1 10*3/uL (ref 0.0–0.1)
Basophils Relative: 1 %
Eosinophils Absolute: 0.2 10*3/uL (ref 0.0–0.5)
Eosinophils Relative: 3 %
HCT: 36.4 % — ABNORMAL LOW (ref 39.0–52.0)
Hemoglobin: 12.5 g/dL — ABNORMAL LOW (ref 13.0–17.0)
Immature Granulocytes: 0 %
Lymphocytes Relative: 34 %
Lymphs Abs: 2.3 10*3/uL (ref 0.7–4.0)
MCH: 26.8 pg (ref 26.0–34.0)
MCHC: 34.3 g/dL (ref 30.0–36.0)
MCV: 78.1 fL — ABNORMAL LOW (ref 80.0–100.0)
Monocytes Absolute: 0.5 10*3/uL (ref 0.1–1.0)
Monocytes Relative: 8 %
Neutro Abs: 3.6 10*3/uL (ref 1.7–7.7)
Neutrophils Relative %: 54 %
Platelets: 252 10*3/uL (ref 150–400)
RBC: 4.66 MIL/uL (ref 4.22–5.81)
RDW: 14.3 % (ref 11.5–15.5)
WBC: 6.8 10*3/uL (ref 4.0–10.5)
nRBC: 0 % (ref 0.0–0.2)

## 2020-12-20 LAB — COMPREHENSIVE METABOLIC PANEL
ALT: 19 U/L (ref 0–44)
AST: 23 U/L (ref 15–41)
Albumin: 3.6 g/dL (ref 3.5–5.0)
Alkaline Phosphatase: 74 U/L (ref 38–126)
Anion gap: 12 (ref 5–15)
BUN: 14 mg/dL (ref 8–23)
CO2: 22 mmol/L (ref 22–32)
Calcium: 9.1 mg/dL (ref 8.9–10.3)
Chloride: 102 mmol/L (ref 98–111)
Creatinine, Ser: 1.31 mg/dL — ABNORMAL HIGH (ref 0.61–1.24)
GFR, Estimated: >60 mL/min (ref >60)
Glucose, Bld: 101 mg/dL — ABNORMAL HIGH (ref 70–99)
Potassium: 4.3 mmol/L (ref 3.5–5.1)
Sodium: 136 mmol/L (ref 135–145)
Total Bilirubin: 0.5 mg/dL (ref 0.3–1.2)
Total Protein: 6.6 g/dL (ref 6.5–8.1)

## 2020-12-20 NOTE — Progress Notes (Addendum)
PROGRESS NOTE   Subjective/Complaints: No complaints this morning Hgb 12.5, up from 12.2 Cr 1.31, up from 1.04. Placed nursing order to encourage hydration and ordered repeat BMP for tomorrow.  ROS: denies pain   Objective:   No results found. Recent Labs    12/20/20 0439  WBC 6.8  HGB 12.5*  HCT 36.4*  PLT 252   Recent Labs    12/20/20 0439  NA 136  K 4.3  CL 102  CO2 22  GLUCOSE 101*  BUN 14  CREATININE 1.31*  CALCIUM 9.1    Intake/Output Summary (Last 24 hours) at 12/20/2020 1310 Last data filed at 12/20/2020 1694 Gross per 24 hour  Intake 640 ml  Output 1200 ml  Net -560 ml        Physical Exam: Vital Signs Blood pressure 106/71, pulse 68, temperature 98 F (36.7 C), temperature source Oral, resp. rate 20, height 6\' 4"  (1.93 m), weight 83.4 kg, SpO2 97 %. Gen: no distress, normal appearing HEENT: oral mucosa pink and moist, NCAT Cardio: Reg rate Chest: normal effort, normal rate of breathing Abd: soft, non-distended Ext: no edema Psych: pleasant, normal affect Skin: intact Neurologic: Cranial nerves II through XII intact, motor strength is 5/5 in left, 4/5 right deltoid, bicep, tricep, grip, hip flexor, knee extensors, ankle dorsiflexor and plantar flexor Sensory exam normal sensation to light touch and proprioception in bilateral upper and lower extremities Cerebellar exam mild dysmetria on the right side compared to the left side  Severe fine motor deficits on the right side, decreased motor control on the right side unable to gauge grasp musculoskeletal: Full range of motion in all 4 extremities. No joint swelling   Assessment/Plan: 1. Functional deficits which require 3+ hours per day of interdisciplinary therapy in a comprehensive inpatient rehab setting.  Physiatrist is providing close team supervision and 24 hour management of active medical problems listed below.  Physiatrist and  rehab team continue to assess barriers to discharge/monitor patient progress toward functional and medical goals  Care Tool:  Bathing    Body parts bathed by patient: Left arm,Chest,Abdomen,Front perineal area,Right upper leg,Left upper leg,Face   Body parts bathed by helper: Right arm,Buttocks,Right lower leg,Left lower leg     Bathing assist Assist Level: Moderate Assistance - Patient 50 - 74% (Per OT Eval)     Upper Body Dressing/Undressing Upper body dressing        Upper body assist Assist Level: Moderate Assistance - Patient 50 - 74%    Lower Body Dressing/Undressing Lower body dressing      What is the patient wearing?: Pants,Underwear/pull up     Lower body assist Assist for lower body dressing: Moderate Assistance - Patient 50 - 74%     Toileting Toileting    Toileting assist Assist for toileting: Moderate Assistance - Patient 50 - 74%     Transfers Chair/bed transfer  Transfers assist     Chair/bed transfer assist level: Contact Guard/Touching assist     Locomotion Ambulation   Ambulation assist      Assist level: Minimal Assistance - Patient > 75% Assistive device: No Device Max distance: 150   Walk 10 feet activity  Assist     Assist level: Minimal Assistance - Patient > 75% Assistive device: No Device   Walk 50 feet activity   Assist    Assist level: Minimal Assistance - Patient > 75% Assistive device: No Device    Walk 150 feet activity   Assist    Assist level: Minimal Assistance - Patient > 75% Assistive device: No Device    Walk 10 feet on uneven surface  activity   Assist     Assist level: Minimal Assistance - Patient > 75% Assistive device: Hand held assist   Wheelchair     Assist Will patient use wheelchair at discharge?: No             Wheelchair 50 feet with 2 turns activity    Assist            Wheelchair 150 feet activity     Assist          Blood pressure 106/71,  pulse 68, temperature 98 F (36.7 C), temperature source Oral, resp. rate 20, height 6\' 4"  (1.93 m), weight 83.4 kg, SpO2 97 %.  Medical Problem List and Plan: 1. 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 Status post mechanical thrombectomy -patient may shower -ELOS/Goals: modI in 5-7 days -Continue CIR 2. Antithrombotics: -DVT/anticoagulation: Continue SCDs, ambulating >150 feet -antiplatelet therapy: Aspirin 81 mg daily and Brilinta 90 mg twice daily 3. Pain Management: ContinueTylenol as needed- last required 3/6 4. Mood: Provide emotional support -antipsychotic agents: N/A 5. Neuropsych: This patient is capable of making decisions on his own behalf. 6. Skin/Wound Care: Routine skin checks 7. Fluids/Electrolytes/Nutrition: Routine in and outs with follow-up chemistries 8. Hypertension. Continue Norvasc 10 mg daily. Monitor with increased mobility      Vitals:   12/18/20 2043 12/19/20 0333  BP: 108/77 121/77  Pulse: 69 71  Resp: 20 20  Temp: 97.7 F (36.5 C) 97.8 F (36.6 C)  SpO2: 98% 99%  Controlled 9. History of alcohol tobacco as well as polysubstance abuse. Provide counseling 10. Hyperlipidemia. Zocor 11. Mechanical fall 12/15/2020. Sustained small abrasion right knee and large right facial scalp hematoma. Follow-up CT scan unchanged #12.  Patient reported discharge from right eye yesterday morning, started on tobramycin ophthalmic drops.  Continue x7 days on a 4 times daily basis 13. AKI: Cr up to 1.31- placed nursing order to encourage hydration, repeat tomorrow. 14. Dysphagia: continue intensive SLP      LOS: 3 days A FACE TO FACE EVALUATION WAS PERFORMED  Dustin Golden 12/20/2020, 1:10 PM

## 2020-12-20 NOTE — IPOC Note (Addendum)
Overall Plan of Care [facility]) Patient Details Name: Dustin Golden MRN: [id number] DOB: [birth year]  Admitting Diagnosis: Left middle cerebral artery stroke [facility])  Hospital Problems: Principal Problem:   Left middle cerebral artery stroke [facility])     Functional Problem List: Nursing Medication Management,Safety,Pain  PT Balance,Behavior,Endurance,Motor,Perception,Safety,Sensory  OT Balance,Cognition,Endurance,Motor,Perception,Safety,Sensory,Vision,Skin Integrity  SLP Cognition,Linguistic,Nutrition  TR         Basic ADL's: OT Grooming,Bathing,Dressing,Toileting,Eating     Advanced  ADL's: OT       Transfers: PT Bed Mobility,Bed to Chair,Car,Furniture,Floor  OT Toilet,Tub/Shower     Locomotion: PT Ambulation,Stairs,Wheelchair Mobility     Additional Impairments: OT Fuctional Use of Upper Extremity  SLP Swallowing,Communication,Social Cognition expression Problem Solving  TR      Anticipated Outcomes Item Anticipated Outcome  Self Feeding S  Swallowing  mod I   Basic self-care  S  Toileting  S   Bathroom Transfers S  Bowel/Bladder  n/a  Transfers  supervision  Locomotion  supervision  Communication  mod I  Cognition     Pain  Pain at or below level4  Safety/Judgment  Maintain safety with cues/reminders   Therapy Plan: PT Intensity: Minimum of 1-2 x/day ,45 to 90 minutes PT Frequency: 5 out of 7 days PT Duration Estimated Length of Stay: 14 days OT Intensity: Minimum of 1-2 x/day, 45 to 90 minutes OT Frequency: 5 out of 7 days OT Duration/Estimated Length of Stay: 10-14 SLP Intensity: Minumum of 1-2 x/day, 30 to 90 minutes SLP Frequency: 3 to 5 out of 7 days SLP Duration/Estimated Length of Stay: 14-21 days   Due to the current state of emergency, patients may not be receiving their 3-hours of Medicare-mandated therapy.   Team Interventions: Nursing Interventions Patient/Family Education,Disease Management/Prevention,Medication Management,Discharge  Planning,Pain Management  PT interventions Ambulation/gait training,DME/adaptive equipment instruction,Neuromuscular re-education,Stair training,UE/LE Strength taining/ROM,Wheelchair propulsion/positioning,Balance/vestibular training,Discharge planning,Therapeutic Activities,UE/LE Coordination activities,Cognitive remediation/compensation,Functional mobility training,Patient/family education,Therapeutic Exercise,Visual/perceptual remediation/compensation,Psychosocial support  OT Interventions Balance/vestibular training,Discharge planning,Functional electrical stimulation,Pain management,Self Care/advanced ADL retraining,Therapeutic Activities,UE/LE Coordination activities,Cognitive remediation/compensation,Disease mangement/prevention,Functional mobility training,Patient/family education,Skin care/wound managment,Therapeutic Exercise,Visual/perceptual remediation/compensation,Wheelchair propulsion/positioning,UE/LE Strength taining/ROM,Psychosocial support,Splinting/orthotics,Community reintegration,DME/adaptive equipment instruction,Neuromuscular re-education  SLP Interventions Cognitive remediation/compensation,Cueing hierarchy,Dysphagia/aspiration precaution training,Functional tasks,Internal/external aids,Patient/family education,Speech/Language facilitation  TR Interventions    SW/CM Interventions Discharge Planning,Patient/Family Education,Psychosocial Support   Barriers to Discharge MD  Medical stability  Nursing Decreased caregiver support,Home environment access/layout    PT Other (comments) R inattention, apraxia  OT Decreased caregiver support,Inaccessible home environment    SLP      SW       Team Discharge Planning: Destination: PT-Home ,OT- Home , SLP-Home Projected Follow-up: PT-Outpatient PT, OT-  Outpatient OT, SLP-Home Health SLP,24 hour supervision/assistance Projected Equipment Needs: PT-To be determined, OT- 3 in 1 bedside comode,To be determined,Tub/shower bench,  SLP-None recommended by SLP Equipment Details: PT-has none, OT-  Patient/family involved in discharge planning: PT- Patient,  OT-Patient, SLP-Patient  MD ELOS: 5-7 days Medical Rehab Prognosis:  Excellent Assessment: [name] is a 65 year old man who is admitted to CIR with a left MCA stroke. He is being treated with Aspirin and Brilinta for streok prevention, Norvasc for HTN and BP has been well controlled with this. He had a mechanical fall on 3/22 and repeat CT scan of brain was unchanged. He was started on Tobramycin for eye discharge.    See Team Conference Notes for weekly updates to the plan of care

## 2020-12-20 NOTE — Progress Notes (Signed)
Speech Language Pathology Daily Session Note  Patient Details  Name: Dustin Golden MRN: [id number] Date of Birth: 03-22-1956  Today's Date: 12/20/2020 SLP Individual Time: 8502-7741 SLP Individual Time Calculation (min): 58 min  Short Term Goals: Week 1: SLP Short Term Goal 1 (Week 1): Pt will use overarticulation, loud voice, and slow rate of speech with min assist verbal cues to achieve intelligibility at the sentence level in the presence of mild background noise or other physical/visual barriers. SLP Short Term Goal 2 (Week 1): Pt will use compensatory word finding stratgies during both structured and unstructured language tasks with supervision verbal cues. SLP Short Term Goal 3 (Week 1): Pt will consume dys 3 textures and thin liquids with mod I use of swallowing strategies and minimal overt s/s of aspiration. SLP Short Term Goal 4 (Week 1): Pt will consume therapeutic trials of regular textures with supervision cues for use of swallowing precautions and minimal overt s/s of aspiration.  Skilled Therapeutic Interventions: Pt was seen for skilled ST targeting dysphagia and speech goals. SLP facilitated session with Min A verbal cues for use of strategies for pt to clear right buccal pocketing of Dys 3 (mechcnaical soft) texture breakfast solids (lingual sweeps and liquid washes). He verbally recalled these strategies Mod I. Despite verbosity and pocketing during meal, no overt s/sx aspiration noted across solid or thin liquid intake. Recommend continue current diet. SLP also engaged pt in sentence and conversation level tasks, during which he was 80-85% intelligible. Min A verbal cues were required for pt to repeat unintelligible utterances using overarticulation strategy. Pt left sitting upright in bed with alarm set and needs within reach. Continue per current plan of care.         Pain Pain Assessment Pain Scale: Faces Faces Pain Scale: No hurt  Therapy/Group: Individual Therapy  Arbutus Leas 12/20/2020, 7:24 AM

## 2020-12-20 NOTE — Care Management (Signed)
Inpatient [place] Individual Statement of Services  Patient Name:  Dustin Golden  Date:  12/20/2020  Welcome to the [facility].  Our goal is to provide you with an individualized program based on your diagnosis and situation, designed to meet your specific needs.  With this comprehensive rehabilitation program, you will be expected to participate in at least 3 hours of rehabilitation therapies Monday-Friday, with modified therapy programming on the weekends.  Your rehabilitation program will include the following services:  Physical Therapy (PT), Occupational Therapy (OT), Speech Therapy (ST), 24 hour per day rehabilitation nursing, Therapeutic Recreaction (TR), Psychology, Neuropsychology, Care Coordinator, Rehabilitation Medicine, Nutrition Services, Pharmacy Services and Other  Weekly team conferences will be held on Tuesdays to discuss your progress.  Your Inpatient Rehabilitation Care Coordinator will talk with you frequently to get your input and to update you on team discussions.  Team conferences with you and your family in attendance may also be held.  Expected length of stay: 10-14 days  Overall anticipated outcome: Supervision  Depending on your progress and recovery, your program may change. Your Inpatient Rehabilitation Care Coordinator will coordinate services and will keep you informed of any changes. Your Inpatient Rehabilitation Care Coordinator's name and contact numbers are listed  below.  The following services may also be recommended but are not provided by the [facility] will be made to provide these services after discharge if needed.  Arrangements include referral to agencies that provide these services.  Your insurance has been verified to be:  [employer]  Your primary doctor  is:  No PCP  Pertinent information will be shared with your doctor and your insurance company.  Inpatient Rehabilitation Care Coordinator:  Cathleen Corti [phone] or ([phone]  Information discussed with and copy given to patient by: Rana Snare, 12/20/2020, 1:35 PM

## 2020-12-20 NOTE — Progress Notes (Signed)
Physical Therapy Session Note  Patient Details  Name: Dustin Golden MRN: [id number] Date of Birth: 07-24-1956  Today's Date: 12/20/2020 PT Individual Time: 1330-1430 PT Individual Time Calculation (min): 60 min   Short Term Goals: Week 1:  PT Short Term Goal 1 (Week 1): Pt will transfer bed to/from chair w/supervision PT Short Term Goal 2 (Week 1): Pt will ambulate >240ft on level surfaces w/cga PT Short Term Goal 3 (Week 1): Pt will perform simulated household ambulation w/cga and cues <50% time to attend to obstacles/R environment PT Short Term Goal 4 (Week 1): Pt will demonstrate controlled R hand placement during sit to stand and transfers w/cues <50% time needed. PT Short Term Goal 5 (Week 1): Pt will tolerate either BERG or Functional Gait assessment  Skilled Therapeutic Interventions/Progress Updates:     Patient in recliner with his daughter and mother in the room upon PT arrival. Patient alert and agreeable to PT session. Patient denied pain during session. Discussed hematoma on R supraorbital, patient reports intermittent cloudy vision that improves with eye drops, denies pain, dizziness, headache, eye fatigue, or diplopia. Reports hx of headache, but none today.    Therapeutic Activity: Transfers: Patient performed sit to/from stand x8 with CGA progressing to supervision. Provided verbal cues for narrow BOS as patient tends to place R foot wide prior to standing. Five times Sit to Stand Test (FTSS) Method: Use a straight back chair with a solid seat that is 16-18" high. Ask participant to sit on the chair with arms folded across their chest.   Instructions: "Stand up and sit down as quickly as possible 5 times, keeping your arms folded across your chest."   Measurement: Stop timing when the participant stands the 5th time.  TIME: __19.2____ (in seconds)  Times > 13.6 seconds is associated with increased disability and morbidity (Guralnik, 2000) Times > 15 seconds is  predictive of recurrent falls in healthy individuals aged 6 and older (Buatois, et al., 2008) Normal performance values in community dwelling individuals aged 65 and older (Bohannon, 2006): o 60-69 years: 11.4 seconds o 70-79 years: 12.6 seconds o 80-89 years: 14.8 seconds  MCID: ? 2.3 seconds for Vestibular Disorders (Meretta, 2006)  Gait Training:  Patient ambulated >200 feet x2 without AD with CGA. Ambulated with decreased gait speed, decreased step length and height, mild forward trunk lean, decreased R arm swing, decreased R side attention, bumping his R arm x3, and increased BOS. Provided verbal cues for erect posture, increased gait speed and arm swing for improved balance, reduced BOS, and visual scanning to R to prevent injury to R arm.  6 Min Walk Test:  Instructed patient to ambulate as quickling and as safely as possible for 6 minutes using LRAD. Patient was allowed to take standing rest breaks without stopping the test, but if he required a sitting rest break the clock would be stopped and the test would be over.  Results: 1463 feet (446 meters) with CGA progressing to supervision without AD. Patient demonstrates decreased activity tolerance compared to age matched norms (25-69 yo 55 m).   Average gait speed: 1.24 m/s  Neuromuscular Re-ed: Patient performed the [employer], see details below: Patient demonstrates increased fall risk as noted by score of 48/56 on Berg Balance Scale.  (<36= high risk for falls, close to 100%; 37-45 significant >80%; 46-51 moderate >50%; 52-55 lower >25%)  Educated patient and his family on results and interpretation of all outcome measures performed. Educated on fall risk and  home modifications. Also provided education on team conference and d/c planning at end of session.   Patient in recliner with his daughter and mother in the room at end of session with breaks locked, chair alarm set, and all needs within reach. Reinforced that patient  needs to call for assist to transfer and go to the bathroom in the room at this time, patient and family stated understanding.    Therapy Documentation Precautions:  Precautions Precautions: Fall Precaution Comments: MILDLY impulsive, decreased safety awarenness, R inattention Restrictions Weight Bearing Restrictions: No  Balance: Standardized Balance Assessment Standardized Balance Assessment: Berg Balance Test Berg Balance Test Sit to Stand: Able to stand without using hands and stabilize independently Standing Unsupported: Able to stand safely 2 minutes Sitting with Back Unsupported but Feet Supported on Floor or Stool: Able to sit safely and securely 2 minutes Stand to Sit: Sits safely with minimal use of hands Transfers: Able to transfer safely, minor use of hands Standing Unsupported with Eyes Closed: Able to stand 10 seconds safely Standing Ubsupported with Feet Together: Able to place feet together independently and stand 1 minute safely From Standing, Reach Forward with Outstretched Arm: Can reach forward >12 cm safely (5") From Standing Position, Pick up Object from Floor: Able to pick up shoe, needs supervision From Standing Position, Turn to Look Behind Over each Shoulder: Looks behind one side only/other side shows less weight shift Turn 360 Degrees: Able to turn 360 degrees safely but slowly Standing Unsupported, Alternately Place Feet on Step/Stool: Able to stand independently and complete 8 steps >20 seconds Standing Unsupported, One Foot in Front: Able to plae foot ahead of the other independently and hold 30 seconds Standing on One Leg: Able to lift leg independently and hold 5-10 seconds Total Score: 48/56    Therapy/Group: Individual Therapy  Jeania Nater L Trenna Kiely PT, DPT  12/20/2020, 3:41 PM

## 2020-12-20 NOTE — Plan of Care (Signed)
  Problem: Safety: Goal: Non-violent Restraint(s) Outcome: Progressing   Problem: Consults Goal: RH STROKE PATIENT EDUCATION Description: See Patient Education module for education specifics  Outcome: Progressing   Problem: RH SAFETY Goal: RH STG ADHERE TO SAFETY PRECAUTIONS W/ASSISTANCE/DEVICE Description: STG Adhere to Safety Precautions With cues/reminders Assistance/Device. Outcome: Progressing   Problem: RH PAIN MANAGEMENT Goal: RH STG PAIN MANAGED AT OR BELOW PT'S PAIN GOAL Description: At or below level 4 Outcome: Progressing   Problem: RH KNOWLEDGE DEFICIT Goal: RH STG INCREASE KNOWLEGDE OF HYPERLIPIDEMIA Description: Patient will be able to manage HLD with medications and dietary modifications using handouts and educational materials independently Outcome: Progressing Goal: RH STG INCREASE KNOWLEDGE OF STROKE PROPHYLAXIS Description: Patient will be able to manage secondary stroke with medications and dietary modifications using handouts and educational materials independently Outcome: Progressing

## 2020-12-20 NOTE — Progress Notes (Signed)
Occupational Therapy Session Note  Patient Details  Name: Dustin Golden MRN: [id number] Date of Birth: 07/04/1956  Today's Date: 12/20/2020 OT Individual Time: 9833-8250 OT Individual Time Calculation (min): 72 min   Short Term Goals: Week 1:  OT Short Term Goal 1 (Week 1): Pt will transfer to toilet wiht CGA OT Short Term Goal 2 (Week 1): Pt will don B socks with S OT Short Term Goal 3 (Week 1): Pt will thread BLE into pants wiht S OT Short Term Goal 4 (Week 1): Pt will use RUE to complete oral care  Skilled Therapeutic Interventions/Progress Updates:    Pt greeted in bed with no c/o pain, stating: "I'm happy to be on this side of the flowers" per pt meaning that he's happy to be alive given his medical journey. Pt very encouraged that sensation is returning in his affected UE. He declined shower, wanting to instead wait until tomorrow when his family brings in new clothing. Pt agreeable to start session by using the restroom. Supine<sit from flat bed without bedrail, per set up at home, completed with setup assist. He ambulated without AD and CGA to the toilet, B+B void. Pt using his personal cleansing wipes to complete perihygiene with supervision-CGA while seated. Pt able to utilize the Rt hand for pants down but was unable to locate/grip fabric for pants up due to ?proprioceptive vs sensation deficits. Pt then completed hand washing and oral care while standing at the sink. Instructed pt how to use active assist techniques to increase functional use of the Rt hand when meeting demands of this task. He then transferred to the w/c, very motivated to walk today. OT escorted him to an outdoor environment on a sunny day. Pts affect visibly bright at this time. He ambulated over uneven concrete and brick terrain, up/down  7 + 5 stairs with landing in between, steps ~2 inches high. Pt had unilateral support on the rail during stair negotiation. CGA for all ambulation without AD, vcs for vertical and  horizontal head turns and incorporating natural arm swing for higher level balance challenges. At end of session pt was returned to the room via w/c and transferred to the recliner. All needs within reach and chair alarm set prior to departure.   Therapy Documentation Precautions:  Precautions Precautions: Fall Precaution Comments: MILDLY impulsive, decreased safety awarenness, R inattention Restrictions Weight Bearing Restrictions: No \\ADL : ADL Grooming: Minimal assistance Where Assessed-Grooming: Standing at sink Upper Body Bathing: Minimal assistance Where Assessed-Upper Body Bathing: Shower Lower Body Bathing: Moderate assistance Where Assessed-Lower Body Bathing: Shower Upper Body Dressing: Minimal assistance Where Assessed-Upper Body Dressing: Sitting at sink Lower Body Dressing: Maximal assistance Where Assessed-Lower Body Dressing: Sitting at sink,Standing at sink [employer] Transfer: Minimal assistance Social research officer, government Method: Ambulating      Therapy/Group: Individual Therapy  Daric Koren A Weiland Tomich 12/20/2020, 11:42 AM

## 2020-12-21 DIAGNOSIS — I63512 Cerebral infarction due to unspecified occlusion or stenosis of left middle cerebral artery: Secondary | ICD-10-CM | POA: Diagnosis not present

## 2020-12-21 LAB — BASIC METABOLIC PANEL
Anion gap: 10 (ref 5–15)
BUN: 13 mg/dL (ref 8–23)
CO2: 23 mmol/L (ref 22–32)
Calcium: 9 mg/dL (ref 8.9–10.3)
Chloride: 101 mmol/L (ref 98–111)
Creatinine, Ser: 1.11 mg/dL (ref 0.61–1.24)
GFR, Estimated: >60 mL/min (ref >60)
Glucose, Bld: 103 mg/dL — ABNORMAL HIGH (ref 70–99)
Potassium: 4.1 mmol/L (ref 3.5–5.1)
Sodium: 134 mmol/L — ABNORMAL LOW (ref 135–145)

## 2020-12-21 MED ORDER — AMLODIPINE BESYLATE 5 MG PO TABS
5.0000 mg | ORAL_TABLET | Freq: Every day | ORAL | Status: DC
  Administered 2020-12-22: 5 mg via ORAL
  Filled 2020-12-21: qty 1

## 2020-12-21 NOTE — Progress Notes (Signed)
Speech Language Pathology Daily Session Note  Patient Details  Name: Dustin Golden MRN: [id number] Date of Birth: [birth year]  Today's Date: 12/21/2020 SLP Individual Time: 1300-1330 SLP Individual Time Calculation (min): 30 min  Short Term Goals: Week 1: SLP Short Term Goal 1 (Week 1): Pt will use overarticulation, loud voice, and slow rate of speech with min assist verbal cues to achieve intelligibility at the sentence level in the presence of mild background noise or other physical/visual barriers. SLP Short Term Goal 2 (Week 1): Pt will use compensatory word finding stratgies during both structured and unstructured language tasks with supervision verbal cues. SLP Short Term Goal 3 (Week 1): Pt will consume dys 3 textures and thin liquids with mod I use of swallowing strategies and minimal overt s/s of aspiration. SLP Short Term Goal 4 (Week 1): Pt will consume therapeutic trials of regular textures with supervision cues for use of swallowing precautions and minimal overt s/s of aspiration.  Skilled Therapeutic Interventions: Skilled treatment session focused on speech and dysphagia goals. SLP facilitated session by providing trials of regular textures. Patient demonstrated efficient mastication with complete oral clearance with use of a liquid wash and overall supervision level verbal cues to self-monitor and correct right buccal pocketing/aterior spillage with use of a mirror. No overt s/s of aspiration noted. Recommend trial tray prior to upgrade due to impulsivity with bite size. Patient also required Min verbal cues for use of speech intelligibility strategies at the sentence level to achieve ~90% intelligibility. Patient left upright in recliner with alarm on and all needs within reach. Continue with current plan of care.      Pain No/Denies Pain   Therapy/Group: Individual Therapy  Dustin Golden 12/21/2020, 2:00 PM

## 2020-12-21 NOTE — Progress Notes (Signed)
Patient ID: Dustin Golden, male   DOB: [birth year], 65 y.o.   MRN: [id number]  SW met with pt and pt dtr Dustin Golden in room to provide updates from team conference, and d/c date 3/15. SW informed on recommendation being outpatient PT/OT/SLP and shower chair. SW discussed outpatient locations in pt area. Prefers [facility]. Pt dtr Dustin Golden to get shower chair. She intends to return to [facility] today, and reports she will come in for intermittent check-ins about twice a month for 3 days at each visit. She intends to be here on pt discharge date if her husband's work schedule will allow her to available. She is ware SW will speak with pt so [name] [phone]) to discuss family education.   *SW spoke with [name] to provide updates, and to schedule family education. Reports Monday morning, will be fine, and will follow-up. She later called back to change family edu time to Monday, 3/14 1pm-3pm. SW discussed pt gains made in rehab again, and how pt will require transportation atleast 2-3xs per week for outpatient therapy. She mentioned concerns related to outpatient therapy since she does work from home. She intends to speak with patient's family about assistance with transportation. SW explained the difference between [facility] and Outpatient, and outpatient being recommended due to patient's current progress in rehab which would not make him appropriate for home health. SW waiting on direction about decision.   Loralee Pacas, MSW, [place] Office: [phone] Cell: [phone] Fax: [phone]

## 2020-12-21 NOTE — Progress Notes (Signed)
Occupational Therapy Session Note  Patient Details  Name: Dustin Golden MRN: [id number] Date of Birth: 07/20/1956  Today's Date: 12/21/2020 OT Individual Time: 1035-1130 OT Individual Time Calculation (min): 55 min    Short Term Goals: Week 1:  OT Short Term Goal 1 (Week 1): Pt will transfer to toilet wiht CGA OT Short Term Goal 2 (Week 1): Pt will don B socks with S OT Short Term Goal 3 (Week 1): Pt will thread BLE into pants wiht S OT Short Term Goal 4 (Week 1): Pt will use RUE to complete oral care  Skilled Therapeutic Interventions/Progress Updates:    Pt received sitting in recliner with his daughter present. Discussed d/c and conference discussion. Both reporting pt will have at least one day of only intermittent supervision. Pt completed ambulatory transfer to the bathroom with CGA. He completed toileting in standing with CGA. He required min A to doff clothing in standing- discussed recommendation to sit for safety. Pt transferred into the shower with CGA onto [facility]. He completed majority of bathing seated. Good spontaneous use of the RUE during bathing and appropriate thorough bathing. One instance of reduced RUE awareness with his arm tucked behind him and caught on [facility]. Pt completed standing level peri washing with CGA. Pt returned to the recliner to dress. Great recall of hemi techniques- donning shirt with close supervision. CGA to don pants. Pt completed 200+ft of functional mobility to the ADL apt to practice tub transfer. Pt took a seated rest break while OT provided demo re shower chair vs TTB. Pt completed both transfers with CGA. He preferred to use shower chair option- sitting down from the outside. Pt returned to his room and was passed off to PT. PT also took over edu of provided theraputty with instructions.   Therapy Documentation Precautions:  Precautions Precautions: Fall Precaution Comments: MILDLY impulsive, decreased safety awarenness, R  inattention Restrictions Weight Bearing Restrictions: No  Therapy/Group: Individual Therapy  Curtis Sites 12/21/2020, 6:40 AM

## 2020-12-21 NOTE — Progress Notes (Signed)
PROGRESS NOTE   Subjective/Complaints: No complaints this morning Na down to 134, monitor weekly Cr normalized.  Doing great with therapy!  ROS: denies pain   Objective:   No results found. Recent Labs    12/20/20 0439  WBC 6.8  HGB 12.5*  HCT 36.4*  PLT 252   Recent Labs    12/20/20 0439 12/21/20 0545  NA 136 134*  K 4.3 4.1  CL 102 101  CO2 22 23  GLUCOSE 101* 103*  BUN 14 13  CREATININE 1.31* 1.11  CALCIUM 9.1 9.0    Intake/Output Summary (Last 24 hours) at 12/21/2020 0830 Last data filed at 12/21/2020 0724 Gross per 24 hour  Intake 850 ml  Output 1475 ml  Net -625 ml        Physical Exam: Vital Signs Blood pressure 112/76, pulse 68, temperature 97.8 F (36.6 C), resp. rate 20, height 6\' 4"  (1.93 m), weight 83.4 kg, SpO2 98 %. Gen: no distress, normal appearing HEENT: oral mucosa pink and moist, NCAT Cardio: Reg rate Chest: normal effort, normal rate of breathing Abd: soft, non-distended Ext: no edema Psych: pleasant, normal affect Skin: intact Neurologic: Cranial nerves II through XII intact, motor strength is 5/5 in left, 4/5 right deltoid, bicep, tricep, grip, hip flexor, knee extensors, ankle dorsiflexor and plantar flexor Sensory exam normal sensation to light touch and proprioception in bilateral upper and lower extremities Cerebellar exam mild dysmetria on the right side compared to the left side  Severe fine motor deficits on the right side, decreased motor control on the right side unable to gauge grasp musculoskeletal: Full range of motion in all 4 extremities. No joint swelling   Assessment/Plan: 1. Functional deficits which require 3+ hours per day of interdisciplinary therapy in a comprehensive inpatient rehab setting.  Physiatrist is providing close team supervision and 24 hour management of active medical problems listed below.  Physiatrist and rehab team continue to assess  barriers to discharge/monitor patient progress toward functional and medical goals  Care Tool:  Bathing    Body parts bathed by patient: Left arm,Chest,Abdomen,Front perineal area,Right upper leg,Left upper leg,Face   Body parts bathed by helper: Right arm,Buttocks,Right lower leg,Left lower leg     Bathing assist Assist Level: Moderate Assistance - Patient 50 - 74% (Per OT Eval)     Upper Body Dressing/Undressing Upper body dressing        Upper body assist Assist Level: Moderate Assistance - Patient 50 - 74%    Lower Body Dressing/Undressing Lower body dressing      What is the patient wearing?: Pants,Underwear/pull up     Lower body assist Assist for lower body dressing: Moderate Assistance - Patient 50 - 74%     Toileting Toileting    Toileting assist Assist for toileting: Moderate Assistance - Patient 50 - 74%     Transfers Chair/bed transfer  Transfers assist     Chair/bed transfer assist level: Contact Guard/Touching assist     Locomotion Ambulation   Ambulation assist      Assist level: Contact Guard/Touching assist Assistive device: No Device Max distance: 1463 ft   Walk 10 feet activity   Assist  Assist level: Contact Guard/Touching assist Assistive device: No Device   Walk 50 feet activity   Assist    Assist level: Contact Guard/Touching assist Assistive device: No Device    Walk 150 feet activity   Assist    Assist level: Contact Guard/Touching assist Assistive device: No Device    Walk 10 feet on uneven surface  activity   Assist     Assist level: Minimal Assistance - Patient > 75% Assistive device: Hand held assist   Wheelchair     Assist Will patient use wheelchair at discharge?: No             Wheelchair 50 feet with 2 turns activity    Assist            Wheelchair 150 feet activity     Assist          Blood pressure 112/76, pulse 68, temperature 97.8 F (36.6 C), resp.  rate 20, height 6\' 4"  (1.93 m), weight 83.4 kg, SpO2 98 %.  Medical Problem List and Plan: 1. 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 Status post mechanical thrombectomy -patient may shower -ELOS/Goals: modI in 5-7 days -Continue CIR 2. Antithrombotics: -DVT/anticoagulation: Continue SCDs, ambulating >150 feet -antiplatelet therapy: Aspirin 81 mg daily and Brilinta 90 mg twice daily 3. Pain Management: Continue Tylenol as needed- last required 3/6 4. Mood: Provide emotional support -antipsychotic agents: N/A 5. Neuropsych: This patient is capable of making decisions on his own behalf. 6. Skin/Wound Care: Routine skin checks 7. Fluids/Electrolytes/Nutrition: Routine in and outs with follow-up chemistries 8. Hypertension. Continue Norvasc 10 mg daily. Monitor with increased mobility      Vitals:   12/18/20 2043 12/19/20 0333  BP: 108/77 121/77  Pulse: 69 71  Resp: 20 20  Temp: 97.7 F (36.5 C) 97.8 F (36.6 C)  SpO2: 98% 99%  3/8: very well controlled: decrease amlodipine to 5mg  9. History of alcohol tobacco as well as polysubstance abuse. Provide counseling 10. Hyperlipidemia. Continue Zocor 11. Mechanical fall 12/15/2020. Sustained small abrasion right knee and large right facial scalp hematoma. Follow-up CT scan unchanged #12.  Patient reported discharge from right eye yesterday morning, started on tobramycin ophthalmic drops.  Continue x7 days on a 4 times daily basis 13. AKI: Cr up to 1.31- placed nursing order to encourage hydration, repeat tomorrow.   3/8: normalized, repeat outpatient, continue to encourage hydration     LOS: 4 days A FACE TO FACE EVALUATION WAS PERFORMED  Shelie Lansing P Catalaya Garr 12/21/2020, 8:30 AM

## 2020-12-21 NOTE — Progress Notes (Addendum)
Physical Therapy Session Note  Patient Details  Name: Dustin Golden MRN: [id number] Date of Birth: [birth year]  Today's Date: 12/21/2020 PT Individual Time: 0800-0900 and 1130-1200 PT Individual Time Calculation (min): 60 min and 30 min  Short Term Goals: Week 1:  PT Short Term Goal 1 (Week 1): Pt will transfer bed to/from chair w/supervision PT Short Term Goal 2 (Week 1): Pt will ambulate >285ft on level surfaces w/cga PT Short Term Goal 3 (Week 1): Pt will perform simulated household ambulation w/cga and cues <50% time to attend to obstacles/R environment PT Short Term Goal 4 (Week 1): Pt will demonstrate controlled R hand placement during sit to stand and transfers w/cues <50% time needed. PT Short Term Goal 5 (Week 1): Pt will tolerate either BERG or Functional Gait assessment  Skilled Therapeutic Interventions/Progress Updates:     Session 1: Patient in recliner in the room upon PT arrival. Patient alert and agreeable to PT session. Patient denied pain during session.  Therapeutic Activity: Transfers: Patient performed sit to/from stand with supervision throughout session. Provided verbal cues for reaching back with B upper extremities for safety and improved R arm spatial awareness with a functional activity.  Gait Training:  Patient ambulated >150 feet x2 with CGA-close supervision over level surfaces and >400 feet without AD with CGA-min A performing changes in gait speed, sudded stops and turns, horizontal head turns with visual targeting for objects. Patient with LOB requiring mod A for correction with sudden 180 deg turn to the R. Ambulated with slightly decreased gait speed, >with increased dynamic gait challenge, decreased R arm swing, and mild R inattention. Provided verbal cues for increased gait speed and arm swing for improved balance. Patient ascended/descended 10 steps using R rail with CGA. Performed step-to gait pattern leading with L while ascending and R while descending.  Provided cues for technique and sequencing.   Neuromuscular Re-ed: Patient performed the following activities: Functional Gait Assessment, see details below. Patient demonstrates increased fall risk with dynamic gait as noted by score of 18/30 on Functional Gait Assessment. (<19=increased fall risk with dynamic gait)  -Tapping 4 different colored cones in a semi-circle with R or L foot based on therapist's cues for which foot and color cone x4 min, required min A throughout with intermittent mod A for tapping R foot to contralateral cones  -Righting reactions from standing with PT providing support in leaning all directions and sudden release for patient to use stepping strategies with CGA and min A x1 x2 min  Discussed present deficits, intentional awareness  Patient in recliner in the room at end of session with breaks locked, chair alarm set, and all needs within reach.   Session 2: Patient in recliner with his daughter in the room handed off from [place], [place] upon PT arrival. Patient alert and agreeable to PT session. Patient denied pain during session.  Therapeutic Activity: Transfers: Patient performed sit to/from stand with supervision throughout session.  Gait Training:  Patient ambulated within the room with close supervision for safety. Provided verbal cues for safety awareness and attention when turning.  Neuromuscular Re-ed: -standing x15 min working through putty exercises via handout provided by OT, patient with increased challenge with lumbrical grip, lateral pinch, and abduction exercises, patient maintained standing balance with supervision throughout and PT adjusted table and putty to the R for increased R attention throughout tasks -side-stepping R/L x10 feet with supervision -lateral lunges R/L with visual targets for foot placement with supervision-CGA for balance  Discussed d/c  planning, PT goals, and home safety with patient and his daughter throughout session.    Patient in recliner with his daughter at end of session with breaks locked, chair alarm set, and all needs within reach.    Therapy Documentation Precautions:  Precautions Precautions: Fall Precaution Comments: MILDLY impulsive, decreased safety awarenness, R inattention Restrictions Weight Bearing Restrictions: No   Balance: Standardized Balance Assessment Standardized Balance Assessment: Functional Gait Assessment Functional Gait  Assessment Gait Level Surface: Walks 20 ft in less than 7 sec but greater than 5.5 sec, uses assistive device, slower speed, mild gait deviations, or deviates 6-10 in outside of the 12 in walkway width. Change in Gait Speed: Able to smoothly change walking speed without loss of balance or gait deviation. Deviate no more than 6 in outside of the 12 in walkway width. Gait with Horizontal Head Turns: Performs head turns smoothly with slight change in gait velocity (eg, minor disruption to smooth gait path), deviates 6-10 in outside 12 in walkway width, or uses an assistive device. Gait with Vertical Head Turns: Performs task with slight change in gait velocity (eg, minor disruption to smooth gait path), deviates 6 - 10 in outside 12 in walkway width or uses assistive device Gait and Pivot Turn: Pivot turns safely in greater than 3 sec and stops with no loss of balance, or pivot turns safely within 3 sec and stops with mild imbalance, requires small steps to catch balance. Step Over Obstacle: Is able to step over one shoe box (4.5 in total height) but must slow down and adjust steps to clear box safely. May require verbal cueing. Gait with Narrow Base of Support: Ambulates less than 4 steps heel to toe or cannot perform without assistance. (able to step forward with R, R LOB when attempting to place L foot forward x2) Gait with Eyes Closed: Walks 20 ft, uses assistive device, slower speed, mild gait deviations, deviates 6-10 in outside 12 in walkway width. Ambulates  20 ft in less than 9 sec but greater than 7 sec. Ambulating Backwards: Walks 20 ft, no assistive devices, good speed, no evidence for imbalance, normal gait Steps: Two feet to a stair, must use rail. Total Score: 18/30   Therapy/Group: Individual Therapy  Cherie L Grunenberg PT, DPT  12/21/2020, 12:42 PM

## 2020-12-21 NOTE — Progress Notes (Signed)
Bethel Born, CCC-SLP  Rehab Admission Coordinator  Physical Medicine and Rehabilitation  Progress Notes      Signed  Date of Service:  12/17/2020  1:54 PM           Signed          Show:Clear all [] Manual[] Template[x] Copied  Added by: [x] Lind Covert, Lauren Mamie Nick, CCC-SLP   [] Hover for details  Signed            PMR Admission Coordinator Pre-Admission Assessment   Patient: Dustin Golden is an 65 y.o., male MRN: [id number] DOB: 1956/02/19 Height: 6\' 4"  (193 cm) Weight: 86.5 kg                                                                                                                                                  Insurance Information HMO:     PPO: yes     PCP:      IPA:      80/20:      OTHER:  PRIMARY: [place]      Policy#: [id number]      Subscriber: pt CM Name: Lattie Haw      Phone#: [phone]     Fax#: [phone] Pre-Cert#: [id number]  Received approval 12/16/20 for 7 days beginning 12/17/20-12/24/20.    Employer:  Benefits:  Phone #: [phone]     Name: 3/2 Eff. Date: 04/15/2020     Deduct: $3000      Out of Pocket Max: [phone]      Life Max: none  CIR: 70%      SNF: 100% 120 days per year Outpatient: 70%     Co-Pay: 20 combined per year Home Health: 70%      Co-Pay: 30% DME: 70%     Co-Pay: 30% Providers: in network  SECONDARY: none        Financial Counselor:       Phone#:    [employer]" for patients in Inpatient Rehabilitation Facilities with attached "Privacy Act [place] Records" was provided and verbally reviewed with: N/A   Emergency Contact Information                Contact Information     Name Relation Home Work Mobile     Yobany, Vroom [place] Daughter     [phone]     Ab, [place] Sister Ostrander, Francis Mother [phone]         [name] Friend     [phone]        Current Medical History  Patient Admitting Diagnosis: CVA    History of Present Illness:  65 year old right-handed male with history of tobacco and alcohol use and marijuana on no prescription medications.  Presented 12/13/2020 with acute onset of right-sided weakness and aphasia.  Admission chemistries unremarkable except glucose 117 creatinine 1.28 alcohol negative, urine drug screen positive cocaine as well as marijuana.  Cranial CT scan negative.  Patient did not receive TPA.  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  30 to 50% stenosis of both vertebral artery origins.  Patient underwent emergent mechanical thrombectomy per interventional radiology.  MRI/MRA showed acute infarction left posterior MCA distribution involving the posterior insula and the left frontal parietal cortex.  Small areas of acute infarction in the frontal lobes bilaterally.  MRA demonstrated loss of signal in the right posterior cerebral artery.  Echocardiogram with ejection fraction of 35 to 56% grade 1 diastolic dysfunction.  Neurology follow-up currently maintained on aspirin as well as Brilinta for CVA prophylaxis.  Maintain on a mechanical soft thin liquid diet.  Patient did have a reported fall 12/15/2020 sustaining small abrasion on the right knee and large right facial scalp hematoma patient remained awake alert and interactive unchanged from baseline with follow-up per neurology services and received follow-up cranial CT scan after a fall re demonstrating moderate to large acute early subacute cortical and subcortical left MCA infarct no acute changes..      Complete NIHSS TOTAL: 5 Glasgow Coma Scale Score: 15   Past Medical History         Past Medical History:  Diagnosis Date  . Hypertension        Family History  family history is not on file.   Prior Rehab/Hospitalizations:  Has the patient had prior rehab or hospitalizations prior to  admission? Yes   Has the patient had major surgery during 100 days prior to admission? Yes   Current Medications    Current Facility-Administered Medications:  .   stroke: mapping our early stages of recovery book, , Does not apply, Once, Bhagat, Srishti L, MD .  acetaminophen (TYLENOL) tablet 650 mg, 650 mg, Oral, Q4H PRN, 650 mg at 12/16/20 2142 **OR** acetaminophen (TYLENOL) 160 MG/5ML solution 650 mg, 650 mg, Per Tube, Q4H PRN **OR** acetaminophen (TYLENOL) suppository 650 mg, 650 mg, Rectal, Q4H PRN, Bhagat, Srishti L, MD .  amLODipine (NORVASC) tablet 10 mg, 10 mg, Oral, Daily, Jennelle Human B, NP, 10 mg at 12/17/20 1029 .  aspirin chewable tablet 81 mg, 81 mg, Oral, Daily, Garvin Fila, MD, 81 mg at 12/17/20 1029 .  Chlorhexidine Gluconate Cloth 2 % PADS 6 each, 6 each, Topical, Daily, Garvin Fila, MD, 6 each at 12/17/20 1029 .  docusate sodium (COLACE) capsule 100 mg, 100 mg, Oral, BID PRN, Bhagat, Srishti L, MD .  [DISCONTINUED] folic acid injection 1 mg, 1 mg, Intravenous, Daily, 1 mg at 21/30/86 5784 **OR** folic acid (FOLVITE) tablet 1 mg, 1 mg, Oral, Daily, Gleason, Otilio Carpen, PA-C, 1 mg at 12/17/20 1029 .  hydrALAZINE (APRESOLINE) injection 20 mg, 20 mg, Intravenous, Q6H PRN, Olivencia-Simmons, Ivelisse, NP, 20 mg at 12/14/20 1505 .  insulin aspart (novoLOG) injection 0-9 Units, 0-9 Units, Subcutaneous, Q4H, Bhagat, Srishti L, MD, 1 Units at 12/16/20 0524 .  multivitamin with minerals tablet 1 tablet, 1 tablet, Oral, Daily, Gleason, Otilio Carpen, PA-C, 1 tablet at 12/17/20 1029 .  nicotine (NICODERM CQ - dosed in mg/24 hr) patch 7 mg, 7 mg, Transdermal, Daily, Minor, Grace Bushy, NP, 7 mg at 12/17/20 1028 .  pantoprazole (PROTONIX) EC tablet 40 mg, 40 mg, Oral, QHS,  Priscella Mann, RPH, 40 mg at 12/16/20 2142 .  polyethylene glycol (MIRALAX / GLYCOLAX) packet 17 g, 17 g, Oral, Daily PRN, Bhagat, Srishti L, MD .  senna-docusate (Senokot-S) tablet 1 tablet, 1 tablet, Oral, QHS PRN,  Bhagat, Srishti L, MD .  simvastatin (ZOCOR) tablet 40 mg, 40 mg, Oral, q1800, Olivencia-Simmons, Ivelisse, NP, 40 mg at 12/16/20 1732 .  sodium chloride flush (NS) 0.9 % injection 3 mL, 3 mL, Intravenous, Once, Palumbo, April, MD .  [DISCONTINUED] thiamine (B-1) injection 100 mg, 100 mg, Intravenous, Daily, 100 mg at 12/13/20 1808 **OR** thiamine tablet 100 mg, 100 mg, Oral, Daily, Gleason, Otilio Carpen, PA-C, 100 mg at 12/17/20 1029 .  ticagrelor (BRILINTA) tablet 90 mg, 90 mg, Oral, BID, Garvin Fila, MD, 90 mg at 12/17/20 1029   Patients Current Diet:            Diet Order                            DIET DYS 3 Room service appropriate? Yes; Fluid consistency: Thin  Diet effective now                           Precautions / Restrictions Precautions Precautions: Fall Precaution Comments: High Fall Risk s/p fall 3/2 with R knee abrasion and large right frontal scalp hematoma. R limb apraxia. Restrictions Weight Bearing Restrictions: No    Has the patient had 2 or more falls or a fall with injury in the past year?   Yes   Prior Activity Level Community (5-7x/wk): working fulltime and driving   Prior Functional Level Prior Function Level of Independence: Independent Comments: [place]: Did the patient need help bathing, dressing, using the toilet or eating?  Independent   Indoor Mobility: Did the patient need assistance with walking from room to room (with or without device)? Independent   Stairs: Did the patient need assistance with internal or external stairs (with or without device)? Independent   Functional Cognition: Did the patient need help planning regular tasks such as shopping or remembering to take medications? Independent   Home Assistive Devices / Equipment Home Assistive Devices/Equipment: None Home Equipment: Cane - single point,Crutches (walking stick)   Prior Device Use: Indicate devices/aids used by the patient prior to  current illness, exacerbation or injury? None of the above   Current Functional Level Cognition   Arousal/Alertness: Awake/alert Overall Cognitive Status: Impaired/Different from baseline Current Attention Level: Selective Orientation Level: Oriented X4 Following Commands: Follows one step commands consistently Safety/Judgement: Decreased awareness of safety,Decreased awareness of deficits General Comments: Patient impulsive attempting to stand several times before this therapist instructed him to do so. Attention: Alternating Alternating Attention: Impaired Memory: Impaired Memory Impairment: Decreased recall of new information,Decreased short term memory Decreased Short Term Memory: Verbal complex,Functional complex Problem Solving: Impaired Executive Function: Sequencing,Organizing Sequencing: Impaired Sequencing Impairment: Verbal complex Organizing: Impaired Organizing Impairment: Verbal complex,Functional complex Safety/Judgment: Impaired    Extremity Assessment (includes Sensation/Coordination)   Upper Extremity Assessment: Defer to OT evaluation RUE Deficits / Details: ROM overall WFL; 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 Pt attempts to use functionally however unaware of position of hand/arm and has difficulty manipulating objects; at risk for injuring R hand; unable to oppose thumb to digits RUE Sensation: decreased light touch,decreased proprioception RUE Coordination: decreased fine motor,decreased gross motor  Lower  Extremity Assessment: Defer to PT evaluation RLE Deficits / Details: decreased coordination related to decr sensory input; inattention also likely contributing to inconsistent muscular support in standing RLE Sensation:  (to possibly absent--very delayed responses with 50% accuracy)     ADLs   Overall ADL's : Needs assistance/impaired Eating/Feeding: NPO Grooming: Moderate assistance Grooming Details (indicate cue type  and reason): Patient able to wash face with Min assist and cues for use of affected RUE. Unable to feel food on R side of chin requiring increased cues for accuracy. Upper Body Bathing: Moderate assistance,Sitting Lower Body Bathing: Moderate assistance,Sit to/from stand Upper Body Dressing : Moderate assistance,Sitting Upper Body Dressing Details (indicate cue type and reason): Significant difficulty threading RUE through hospital gown 2/2 apraxia. Increased cues for attention to RUE 2/2 inattention. Lower Body Dressing: Maximal assistance Toilet Transfer: Moderate assistance,+2 for physical assistance Toileting - Clothing Manipulation Details (indicate cue type and reason): Max A; foley Functional mobility during ADLs: Moderate assistance,+2 for physical assistance     Mobility   Overal bed mobility: Needs Assistance Bed Mobility: Supine to Sit,Sit to Supine Rolling: Supervision Sidelying to sit: Mod assist (up from rt side) Supine to sit: Supervision Sit to supine: Supervision General bed mobility comments: attempting to use RUE to raise torso with mod assist to complete     Transfers   Overall transfer level: Needs assistance Equipment used: 1 person hand held assist,Rolling walker (2 wheeled) Transfers: Sit to/from Stand Sit to Stand: Min guard General transfer comment: cues for hand placement     Ambulation / Gait / Stairs / Wheelchair Mobility   Ambulation/Gait Ambulation/Gait assistance: Herbalist (Feet): 150 Feet Assistive device: Rolling walker (2 wheeled),1 person hand held assist,None Gait Pattern/deviations: Step-through pattern General Gait Details: pt with slowed step-through gait, tends to dirft toward R side, has difficulty maintaining R hand on RW. Bumps into 2 computers on R side in hallway Gait velocity: reduced Gait velocity interpretation: <1.8 ft/sec, indicate of risk for recurrent falls     Posture / Balance Dynamic Sitting Balance Sitting  balance - Comments: right leaning; can correct with cues Balance Overall balance assessment: Needs assistance Sitting-balance support: No upper extremity supported,Feet supported Sitting balance-Leahy Scale: Good Sitting balance - Comments: right leaning; can correct with cues Standing balance support: No upper extremity supported Standing balance-Leahy Scale: Fair Standing balance comment: close supervision for static standing     Special needs/care consideration Fall in hospital 3/2 with right scalp hematoma Hgb A1c 6.8 Urine Drug screen positive      Previous Home Environment  Living Arrangements: Alone  Lives With: Alone Available Help at Discharge: Family,Available 24 hours/day Type of Home: House Home Layout: One level,Other (Comment) (basement) Home Access: Stairs to enter Entrance Stairs-Number of Steps: 1 Bathroom Shower/Tub: Diplomatic Services operational officer Accessibility: Yes How Accessible: Accessible via walker,Accessible via wheelchair [place]: No   Discharge Living Setting Plans for Discharge Living Setting: Patient's home,Alone Type of Home at Discharge: House Discharge Home Layout: One level Discharge Home Access: Stairs to enter Entrance Stairs-Rails: None Entrance Stairs-Number of Steps: 1 Discharge Bathroom Shower/Tub: Tub/shower unit Discharge Bathroom Toilet: Standard Discharge Bathroom Accessibility: Yes How Accessible: Accessible via walker Does the patient have any problems obtaining your medications?: No   Social/Family/Support Systems Contact Information: daughter, Deyonte in [place] and girlfriend, [name], local Anticipated Caregiver: [name], girlfriend and his Mom Anticipated Caregiver's Contact Information: see above Ability/Limitations of Caregiver: [name] is an Therapist, sports who works 1  day per week as a private nurse Caregiver Availability: 24/7 Discharge Plan Discussed with Primary Caregiver: Yes Is  Caregiver In Agreement with Plan?: Yes Does Caregiver/Family have Issues with Lodging/Transportation while Pt is in Rehab?: No   Goals Patient/Family Goal for Rehab: Mod I to supervision with PT, OT and SLP Expected length of stay: [facility] 2 to 3 weeks Pt/Family Agrees to Admission and willing to participate: Yes Program Orientation Provided & Reviewed with Pt/Caregiver Including Roles  & Responsibilities: Yes   Decrease burden of Care through IP rehab admission: n/a   Possible need for SNF placement upon discharge:not anticipated   Patient Condition: This patient's condition remains as documented in the consult dated 12/15/20, in which the Rehabilitation Physician determined and documented that the patient's condition is appropriate for intensive rehabilitative care in an inpatient rehabilitation facility. Will admit to inpatient rehab today.   Preadmission Screen Completed By: Danne Baxter RN MSN with updates by Bethel Born, [place], 12/17/2020 11:53 AM ______________________________________________________________________   Discussed status with Dr. Ranell Patrick on 12/17/20  at 11:53 AM and received approval for admission today.   Admission Coordinator: Danne Baxter RN MSN with updates by Bethel Born, time 11:54 AM/Date 12/17/20                   Note Details  Author Bethel Born, CCC-SLP File Time 12/17/2020  1:55 PM  Author Type Rehab Admission Coordinator Status Signed  Last Editor Bethel Born, [place] [facility] [place] # [id number] Admit Date 12/17/2020

## 2020-12-21 NOTE — Plan of Care (Signed)
  Problem: Consults Goal: RH STROKE PATIENT EDUCATION Description: See Patient Education module for education specifics  Outcome: Progressing   Problem: RH SAFETY Goal: RH STG ADHERE TO SAFETY PRECAUTIONS W/ASSISTANCE/DEVICE Description: STG Adhere to Safety Precautions With cues/reminders Assistance/Device. Outcome: Progressing   Problem: RH PAIN MANAGEMENT Goal: RH STG PAIN MANAGED AT OR BELOW PT'S PAIN GOAL Description: At or below level 4 Outcome: Progressing   Problem: RH KNOWLEDGE DEFICIT Goal: RH STG INCREASE KNOWLEGDE OF HYPERLIPIDEMIA Description: Patient will be able to manage HLD with medications and dietary modifications using handouts and educational materials independently Outcome: Progressing Goal: RH STG INCREASE KNOWLEDGE OF STROKE PROPHYLAXIS Description: Patient will be able to manage secondary stroke with medications and dietary modifications using handouts and educational materials independently Outcome: Progressing

## 2020-12-21 NOTE — Patient Care Conference (Signed)
Inpatient RehabilitationTeam Conference and Plan of Care Update Date: 12/21/2020   Time: 10:02 AM    Patient Name: Dustin Golden      Medical Record Number: [id number]  Date of Birth: 05-28-1956 Sex: Male         Room/Bed: 4W02C/4W02C-01 Payor Info: Payor: Theme park manager / Plan: UMR/UHC PPO / Product Type: *No Product type* /    Admit Date/Time:  12/17/2020  3:23 PM  Primary Diagnosis:  Left middle cerebral artery stroke [facility])  Hospital Problems: Principal Problem:   Left middle cerebral artery stroke [facility])    Expected Discharge Date: Expected Discharge Date: 12/28/20  Team Members Present: Physician leading conference: Dr. Leeroy Cha Care Coodinator Present: Loralee Pacas, LCSWA;Norma Montemurro Creig Hines, RN, BSN, [place] Nurse Present: Mohammed Kindle, RN PT Present: Apolinar Junes, PT OT Present: Laverle Hobby, OT SLP Present: Weston Anna, SLP PPS Coordinator present : Gunnar Fusi, SLP     Current Status/Progress Goal Weekly Team Focus  Bowel/Bladder   continent of b/b; LBM: 03/  remain continent b/b      Swallow/Nutrition/ Hydration   Dys. 3 textures with thin liquids, Min A for use of swallow strategies  Mod I  use of swallow strategies, trials of regular textures   ADL's   Mod A overall for UB and LB ADLs, CGA transfers  Supervision level ADLs overall  RUE NMR, safety, ADL transfers, R attention, d/c planning   Mobility   CGA-close supervision overall  Supervision overall  Balance, dynamic gait, gait and stair training, R side NMR, R side attention, d/c planning, patient caregiver education   Communication   Min A  Supervision  Use of speech intelligibility and word-finding strategies   Safety/Cognition/ Behavioral Observations            Pain   no c/o pain  remain pain free  assess pain level QS and prn   Skin   hematoma to R forehead, brusing on R eye  remain free of new skin breakdown/infection  assess skin QS and prn     Discharge Planning:  Pt to be  assessed. Per EMR, pt to d/c to home with support from mother and girlfriend who is an Therapist, sports who only works Statistician day per week as Production designer, theatre/television/film.   Team Discussion: Cr normal, Right eye bruising, hyponatremia, poor proprioception. Continent B/B, no pain reported, small knot on head from fall on previous unit.  Patient on target to meet rehab goals: Min/mod assist, supervision goals. Right neglect, 39/56 on BERG, some fall risk. Contact guard to supervision for all mobility. Supervision goals. Apraxic to upper right extremity. Trials of regular, min assist, pockets on the right. Working on Airline pilot and word finding strategies.    *See Care Plan and progress notes for long and short-term goals.   Revisions to Treatment Plan:  Not at this time.  Teaching Needs: Family education, medication management, skin/wound care, transfer training, gait training, endurance training, balance training.  Current Barriers to Discharge: Inaccessible home environment, Decreased caregiver support, Home enviroment access/layout, Wound care, Lack of/limited family support, Medication compliance and Behavior  Possible Resolutions to Barriers: Continue current medications, provide emotional support.     Medical Summary Current Status: Anemia, right eye ecchymosis, 39/56 on BERG, hyponatremia, poor proprioception  Barriers to Discharge: Medical stability  Barriers to Discharge Comments: Anemia, right eye ecchymosis, hyponatermia, poor proprioception Possible Resolutions to [employer] Focus: Monitor Hgb, Na, and Cr weekly   Continued Need for Acute Rehabilitation Level of Care: The patient  requires daily medical management by a physician with specialized training in physical medicine and rehabilitation for the following reasons: Direction of a multidisciplinary physical rehabilitation program to maximize functional independence : Yes Medical management of patient stability for increased activity during  participation in an intensive rehabilitation regime.: Yes Analysis of laboratory values and/or radiology reports with any subsequent need for medication adjustment and/or medical intervention. : Yes   I attest that I was present, lead the team conference, and concur with the assessment and plan of the team.   Cristi Loron 12/21/2020, 1:22 PM

## 2020-12-22 MED ORDER — AMLODIPINE BESYLATE 10 MG PO TABS
10.0000 mg | ORAL_TABLET | Freq: Every day | ORAL | Status: DC
  Administered 2020-12-23 – 2020-12-28 (×6): 10 mg via ORAL
  Filled 2020-12-22 (×6): qty 1

## 2020-12-22 NOTE — Progress Notes (Signed)
Inpatient Rehabilitation Care Coordinator Discharge Note  The overall goal for the admission was met for:   Discharge location: Yes. D/c to home with 24/7 care.  Length of Stay: Yes. 10 days.  Discharge activity level: Yes. Supervision.   Home/community participation: Yes. Limited.   Services provided included: MD, RD, PT, OT, SLP, RN, CM, TR, Pharmacy, Neuropsych and SW  Financial Services: Private Insurance: UHC/UMR  Choices offered to/list presented to:Yes  Follow-up services arranged: Outpatient: Rehab without [facility] for outpatient PT/OT/SLP and DME: dtr purchased shower chair.   Comments (or additional information):  Patient/Family verbalized understanding of follow-up arrangements: Yes  Individual responsible for coordination of the follow-up plan: contact pt dtr [name] [phone]  Confirmed correct DME delivered: Rana Snare 12/22/2020    Rana Snare

## 2020-12-22 NOTE — Progress Notes (Signed)
Physical Therapy Session Note  Patient Details  Name: Dustin Golden MRN: [id number] Date of Birth: 06-05-1956  Today's Date: 12/22/2020 PT Individual Time: 0800-0900 and  1115-1145 PT Individual Time Calculation (min): 60 min and 30 min   Short Term Goals: Week 1:  PT Short Term Goal 1 (Week 1): Pt will transfer bed to/from chair w/supervision PT Short Term Goal 2 (Week 1): Pt will ambulate >284ft on level surfaces w/cga PT Short Term Goal 3 (Week 1): Pt will perform simulated household ambulation w/cga and cues <50% time to attend to obstacles/R environment PT Short Term Goal 4 (Week 1): Pt will demonstrate controlled R hand placement during sit to stand and transfers w/cues <50% time needed. PT Short Term Goal 5 (Week 1): Pt will tolerate either BERG or Functional Gait assessment  Skilled Therapeutic Interventions/Progress Updates:     Session 1: Patient in recliner in the room upon PT arrival. Patient alert and agreeable to PT session. Patient denied pain during session. Discussed ELOS and d/c planning briefly at beginning of session. Patient in agreement with d/c date of 3/15, reporting that he still has "many things to work on," and that he is seeing good progress with all therapies.   Therapeutic Activity: Transfers: Patient performed sit to/from stand transfers with supervison throughout session. Provided verbal cues for safety awareness due to mild impulsivity.  Gait Training:  Patient ambulated >200 feet x2 without AD with close supervision. Ambulated with decreased gait speed, decreased step length and height, mild forward trunk lean, and downward head gaze. Provided verbal cues for erect posture with scapular retraction, increased R arm swing, and increased gait speed for improved balance and step height.  Neuromuscular Re-ed: Patient performed the following activities with CGA-close supervision: -standing twisting to the R to pick up 8 bean bags, 1-2 pound weighted ball, and 1  palm size plastic ball and placing them in a large wooden box on the R focused on dynamic standing balance, R grasp, R attention -walking with CGA-close supervision holding, after lifting, box filled with balls/bags with B upper extremities, from above ~80 feet with multiple turns focused on R grasp and endurance with weighted box ~5lbs, and dual task challenge -both tasks as above with 7 palm size plastic balls replacing bean bags, balls increased weight in box while walking ~7 lbs -weaving between 7 cones ~1.5 ft apart down and back, first trial 1:42 taking small steps and hit 3 cones, second trial 1:01 with improve step length, hit 1 cone, performed fan kicks/steps on last three  Patient in recliner in the room at end of session with breaks locked, chair alarm set, and all needs within reach. Provided patient with styrofoam cup at end of session to practice control of grip, instructed patient to not crush the cup and pick it up several times per hour during breaks.   Session 2: Patient in recliner in the room upon PT arrival. Patient alert and agreeable to PT session. Patient denied pain during session.  Therapeutic Activity: Transfers: Patient performed sit to/from stand transfers with supervison throughout session. Provided verbal cues for safety awareness due to mild impulsivity.  Gait Training:  Patient ambulated >200 feet x2 without AD with close supervision. Ambulated with decreased gait speed, decreased step length and height, mild forward trunk lean, and downward head gaze. Provided verbal cues for erect posture with scapular retraction, increased R arm swing, and increased gait speed for improved balance and step height. Second trial patient tossed beach ball using  B upper extremity throughout, dropped ball x3 and picked it up with supervision, able to utilize path finding strategies to return to his room without directions.  Neuromuscular Re-ed: Patient performed the following  activities: -weaving between 8 cones ~1.5 feet apart down and back with improved step length and technique from previous session -obstacle course: stepping over 2 hockey sticks, avoiding tall while screen on R, walking over tri-folded floor mat, stepping up and over a 4" step, and walking around a cone then returning through the course Trial 1: 52.3 sec 1 LOB stepping down from mat Trial 2: 41.5 sec no LOB, cued patient to increased speed Trial 3: 35.9 sec while holding a beach ball with B upper extremities Trial 4: 41.8 sec while tossing a beach ball with B upper extremities Named 6 animals in standing Trial 5: 37.5 while naming animals not previously named Trial 6: 39.08 sec while naming fruits or vegetables while tossing a beach ball   Patient took brief standing rest breaks between trials.  Patient in recliner in the room at end of session with breaks locked, chair alarm set, and all needs within reach.   Therapy Documentation Precautions:  Precautions Precautions: Fall Precaution Comments: MILDLY impulsive, decreased safety awarenness, R inattention Restrictions Weight Bearing Restrictions: No   Therapy/Group: Individual Therapy  Cecil Bixby L Ronaldo Crilly PT, DPT  12/22/2020, 12:13 PM

## 2020-12-22 NOTE — Progress Notes (Signed)
PROGRESS NOTE   Subjective/Complaints: No complaints this morning.   ROS: denies pain, constipation   Objective:   No results found. Recent Labs    12/20/20 0439  WBC 6.8  HGB 12.5*  HCT 36.4*  PLT 252   Recent Labs    12/20/20 0439 12/21/20 0545  NA 136 134*  K 4.3 4.1  CL 102 101  CO2 22 23  GLUCOSE 101* 103*  BUN 14 13  CREATININE 1.31* 1.11  CALCIUM 9.1 9.0    Intake/Output Summary (Last 24 hours) at 12/22/2020 1607 Last data filed at 12/22/2020 0756 Gross per 24 hour  Intake 880 ml  Output 1350 ml  Net -470 ml        Physical Exam: Vital Signs Blood pressure (!) 139/97, pulse 94, temperature 98.2 F (36.8 C), temperature source Oral, resp. rate 17, height 6\' 4"  (1.93 m), weight 83.4 kg, SpO2 100 %. Gen: no distress, normal appearing HEENT: oral mucosa pink and moist, NCAT Cardio: Reg rate Chest: normal effort, normal rate of breathing Abd: soft, non-distended Ext: no edema Psych: pleasant, normal affect Skin: intact Neurologic: Cranial nerves II through XII intact, motor strength is 5/5 in left, 4/5 right deltoid, bicep, tricep, grip, hip flexor, knee extensors, ankle dorsiflexor and plantar flexor Sensory exam normal sensation to light touch and proprioception in bilateral upper and lower extremities Cerebellar exam mild dysmetria on the right side compared to the left side  Severe fine motor deficits on the right side, decreased motor control on the right side unable to gauge grasp musculoskeletal: Full range of motion in all 4 extremities. No joint swelling   Assessment/Plan: 1. Functional deficits which require 3+ hours per day of interdisciplinary therapy in a comprehensive inpatient rehab setting.  Physiatrist is providing close team supervision and 24 hour management of active medical problems listed below.  Physiatrist and rehab team continue to assess barriers to discharge/monitor  patient progress toward functional and medical goals  Care Tool:  Bathing    Body parts bathed by patient: Left arm,Chest,Abdomen,Front perineal area,Right upper leg,Left upper leg,Face,Right arm,Buttocks,Right lower leg,Left lower leg   Body parts bathed by helper: Right arm,Buttocks,Right lower leg,Left lower leg     Bathing assist Assist Level: Contact Guard/Touching assist     Upper Body Dressing/Undressing Upper body dressing   What is the patient wearing?: Pull over shirt    Upper body assist Assist Level: Supervision/Verbal cueing    Lower Body Dressing/Undressing Lower body dressing      What is the patient wearing?: Pants,Underwear/pull up     Lower body assist Assist for lower body dressing: Contact Guard/Touching assist     Toileting Toileting    Toileting assist Assist for toileting: Contact Guard/Touching assist     Transfers Chair/bed transfer  Transfers assist     Chair/bed transfer assist level: Supervision/Verbal cueing     Locomotion Ambulation   Ambulation assist      Assist level: Supervision/Verbal cueing Assistive device: No Device Max distance: >200 ft   Walk 10 feet activity   Assist     Assist level: Supervision/Verbal cueing Assistive device: No Device   Walk 50 feet activity  Assist    Assist level: Supervision/Verbal cueing Assistive device: No Device    Walk 150 feet activity   Assist    Assist level: Supervision/Verbal cueing Assistive device: No Device    Walk 10 feet on uneven surface  activity   Assist     Assist level: Minimal Assistance - Patient > 75% Assistive device: Hand held assist   Wheelchair     Assist Will patient use wheelchair at discharge?: No             Wheelchair 50 feet with 2 turns activity    Assist            Wheelchair 150 feet activity     Assist          Blood pressure (!) 139/97, pulse 94, temperature 98.2 F (36.8 C), temperature  source Oral, resp. rate 17, height 6\' 4"  (1.93 m), weight 83.4 kg, SpO2 100 %.  Medical Problem List and Plan: 1. 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 Status post mechanical thrombectomy -patient may shower -ELOS/Goals: modI in 5-7 days -Continue CIR 2. Antithrombotics: -DVT/anticoagulation: Continue SCDs, ambulating >150 feet -antiplatelet therapy: Aspirin 81 mg daily and Brilinta 90 mg twice daily 3. Pain Management: Conitnue Tylenol as needed- last required 3/6 4. Mood: Provide emotional support -antipsychotic agents: N/A 5. Neuropsych: This patient is capable of making decisions on his own behalf. 6. Skin/Wound Care: Routine skin checks 7. Fluids/Electrolytes/Nutrition: Routine in and outs with follow-up chemistries 8. Hypertension. 3/9: diastolic BP elevated: increase amlodipine to 10mg .  9. History of alcohol tobacco as well as polysubstance abuse. Provide counseling 10. Hyperlipidemia. Continue Zocor 11. Mechanical fall 12/15/2020. Sustained small abrasion right knee and large right facial scalp hematoma. Follow-up CT scan unchanged #12.  Patient reported discharge from right eye yesterday morning, started on tobramycin ophthalmic drops.  Continue x7 days on a 4 times daily basis 13. AKI: Cr up to 1.31- placed nursing order to encourage hydration, repeat tomorrow.   3/8: normalized, repeat outpatient, continue to encourage hydration     LOS: 5 days A FACE TO [employer] [facility] 12/22/2020, 4:07 PM

## 2020-12-22 NOTE — Progress Notes (Signed)
Inpatient Rehabilitation Care Coordinator Assessment and Plan Patient Details  Name: Dustin Golden MRN: [id number] Date of Birth: [birth year]  Today's Date: 12/22/2020  Hospital Problems: Principal Problem:   Left middle cerebral artery stroke [facility])  Past Medical History:  Past Medical History:  Diagnosis Date  . Hypertension    Past Surgical History:  Past Surgical History:  Procedure Laterality Date  . APPENDECTOMY    . gastric ulcer surgery    . IR CT HEAD LTD  12/13/2020  . IR INTRAVSC STENT CERV CAROTID W/EMB-PROT MOD SED INCL ANGIO  12/14/2020  . IR PERCUTANEOUS ART THROMBECTOMY/INFUSION INTRACRANIAL INC DIAG ANGIO  12/13/2020  . IR US GUIDE VASC ACCESS RIGHT  12/13/2020  . RADIOLOGY WITH ANESTHESIA N/A 12/13/2020   Procedure: IR WITH ANESTHESIA;  Surgeon: Luanne Bras, MD;  Location: [place];  Service: Radiology;  Laterality: N/A;   Social History:  reports that he has been smoking. He has a 96.00 pack-year smoking history. He has never used smokeless tobacco. He reports current alcohol use. He reports current drug use. Drug: Marijuana.  Family / Support Systems Marital Status: Separated How Long?: 20 years (verbal seperation) Patient Roles: Partner,Parent Spouse/Significant Other: Dustin Golden (girlfriend): [phone] Children: Dustin Golden (daughter): [phone]; lives in [place]. Other Supports: None reported Anticipated Caregiver: girlfriend Dustin Golden Ability/Limitations of Caregiver: Pt girlfriend Dustin Golden works from home and able to provide 24/7 care Caregiver Availability: 24/7 Family Dynamics: Pt lives with his girlfriend Dustin Golden.  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 Education: Associate's degree from [employer]. College Read: Yes Write: Yes Employment Status: Employed Length of Employment: 1 (year) Return to Work Plans: TBD. Pt is waiting to hear from  employer if he has short term disability/FMLA. Legal History/Current Legal Issues: 20 years ago. Guardian/Conservator: N/A   Abuse/Neglect Abuse/Neglect Assessment Can Be Completed: Yes Physical Abuse: Denies Verbal Abuse: Denies Sexual Abuse: Denies Exploitation of patient/patient's resources: Denies Self-Neglect: Denies  Emotional Status Pt's affect, behavior and adjustment status: Pt in good spirits at time of visit Recent Psychosocial Issues: Pt reports some anxiety since being here. Psychiatric History: Denies any hx Substance Abuse History: Pt admits to smoking cigarettes- 2ppd for the last 15 yrs. Plans to quit. Pt admits to drinking a mixed drink on Friday's when he has a social gatherring, and a beer sometimes after getting off work. Denies rec drug use.  Patient / Family Perceptions, Expectations & Goals Pt/Family understanding of illness & functional limitations: Pt and family have a general understanding of care needs Premorbid pt/family roles/activities: Independent Anticipated changes in roles/activities/participation: Some assistance with ADLs/IADLs; 24/7 supervision  [place] Agencies: None Premorbid Home Care/DME Agencies: None Transportation available at discharge: Girlfriend Chief Strategy Officer Living Arrangements: Spouse/significant other Support Systems: Spouse/significant other,Children Type of Residence: Private residence Administrator, sports: Multimedia programmer (specify) Heritage manager) Financial Resources: [place] Referred: No Living Expenses: Medical laboratory scientific officer Management: Patient Does the patient have any problems obtaining your medications?: No Home Management: Both managed home care needs. Patient/Family Preliminary Plans: TBD Care Coordinator Barriers to Discharge: Decreased caregiver support,Lack of/limited family support Care Coordinator Anticipated Follow Up Needs: HH/OP  Clinical  Impression SW met with pt and pt dtr Dustin Golden in room to introduce self, explain role, and discuss discharge process. Pt is not a English as a second language teacher. No HCPOA, but would want his dtr Dustin Golden to make all decision. Would like chaplain services consult. DME: crutches.   SW  informed pt assigned RN on chaplain consult for HCPOA.  [name] 12/22/2020, 2:40 PM

## 2020-12-22 NOTE — Progress Notes (Signed)
Speech Language Pathology Daily Session Note  Patient Details  Name: Dustin Golden MRN: [id number] Date of Birth: 1955-12-04  Today's Date: 12/22/2020 SLP Individual Time: 0700-0730 SLP Individual Time Calculation (min): 30 min  Short Term Goals: Week 1: SLP Short Term Goal 1 (Week 1): Pt will use overarticulation, loud voice, and slow rate of speech with min assist verbal cues to achieve intelligibility at the sentence level in the presence of mild background noise or other physical/visual barriers. SLP Short Term Goal 2 (Week 1): Pt will use compensatory word finding stratgies during both structured and unstructured language tasks with supervision verbal cues. SLP Short Term Goal 3 (Week 1): Pt will consume dys 3 textures and thin liquids with mod I use of swallowing strategies and minimal overt s/s of aspiration. SLP Short Term Goal 4 (Week 1): Pt will consume therapeutic trials of regular textures with supervision cues for use of swallowing precautions and minimal overt s/s of aspiration.  Skilled Therapeutic Interventions: Skilled treatment session focused on dysphagia and cognitive goals. SLP facilitated session by providing skilled observation with breakfast meal of Dys. 3 textures with thin liquids. Patient consumed meal without overt s/s of aspiration and was overall for Mod I for use of swallowing compensatory strategies. Therefore, recommend patient continue current diet and consume meals with intermittent supervision.  Patient also required Min verbal cues for use of speech intelligibility strategies at the conversation level to achieve ~75% intelligibility. Patient left upright in recliner with alarm on and all needs within reach. Continue with current plan of care.      Pain No/Denies Pain   Therapy/Group: Individual Therapy  Korinna Tat 12/22/2020, 7:49 AM

## 2020-12-22 NOTE — Progress Notes (Signed)
Occupational Therapy Session Note  Patient Details  Name: Dustin Golden MRN: [id number] Date of Birth: April 22, 1956  Today's Date: 12/22/2020 OT Individual Time: 1300-1400 OT Individual Time Calculation (min): 60 min    Short Term Goals: Week 1:  OT Short Term Goal 1 (Week 1): Pt will transfer to toilet wiht CGA OT Short Term Goal 2 (Week 1): Pt will don B socks with S OT Short Term Goal 3 (Week 1): Pt will thread BLE into pants wiht S OT Short Term Goal 4 (Week 1): Pt will use RUE to complete oral care  Skilled Therapeutic Interventions/Progress Updates:    Pt received sitting in recliner with no c/o pain. Pt agreeable to shower. He gathered clothes out of dresser with CGA while reaching distally in standing. Ambulatory transfer in room with close supervision. toileting in standing with CGA. Min cueing for safety awareness during transfer into shower. Pt required min cueing for RUE use during bathing. Pt completed all bathing with CGA, intermittent assist for picking up dropped washcloth (5-8x during shower). Discussed proprioceptive deficits when reaching posteriorly to wash buttocks. Pt donned underwear with CGA, shirt with supervision. He completed 200 ft of functional mobility to the ADL apt. He completed functional reaching activity to focus on RUE NMR- shoulder flexion, grasp/release, and hand prehension pattern. Pt required cueing for R attention, for proper motor patterns, increasing supination and reduction of compensatory motor patterns. Pt returned to his room and was left sitting up in the recliner with all needs met. Chair alarm set.   Therapy Documentation Precautions:  Precautions Precautions: Fall Precaution Comments: MILDLY impulsive, decreased safety awarenness, R inattention Restrictions Weight Bearing Restrictions: No   Therapy/Group: Individual Therapy  Curtis Sites 12/22/2020, 6:35 AM

## 2020-12-22 NOTE — Progress Notes (Signed)
Patient ID: Dustin Golden, male   DOB: Oct 28, 1955, 65 y.o.   MRN: [id number]  SW received confirmation from pt dtr [name] no issues with transportation to/from outpatient therapy. SW faxed outpatient PT/OT/SLP to [facility] ([phone]/f:[phone]).  Loralee Pacas, MSW, [place] Office: [phone] Cell: [phone] Fax: [phone]

## 2020-12-22 NOTE — Plan of Care (Signed)
  Problem: Consults Goal: RH STROKE PATIENT EDUCATION Description: See Patient Education module for education specifics  Outcome: Progressing   Problem: RH SAFETY Goal: RH STG ADHERE TO SAFETY PRECAUTIONS W/ASSISTANCE/DEVICE Description: STG Adhere to Safety Precautions With cues/reminders Assistance/Device. Outcome: Progressing   Problem: RH PAIN MANAGEMENT Goal: RH STG PAIN MANAGED AT OR BELOW PT'S PAIN GOAL Description: At or below level 4 Outcome: Progressing   Problem: RH KNOWLEDGE DEFICIT Goal: RH STG INCREASE KNOWLEGDE OF HYPERLIPIDEMIA Description: Patient will be able to manage HLD with medications and dietary modifications using handouts and educational materials independently Outcome: Progressing Goal: RH STG INCREASE KNOWLEDGE OF STROKE PROPHYLAXIS Description: Patient will be able to manage secondary stroke with medications and dietary modifications using handouts and educational materials independently Outcome: Progressing

## 2020-12-23 ENCOUNTER — Inpatient Hospital Stay (HOSPITAL_COMMUNITY): Payer: Commercial Managed Care - PPO

## 2020-12-23 NOTE — Progress Notes (Signed)
Physical Therapy Session Note  Patient Details  Name: Dustin Golden MRN: [id number] Date of Birth: 10-18-1955  Today's Date: 12/23/2020 PT Individual Time: 0900-1000 PT Individual Time Calculation (min): 60 min   Short Term Goals: Week 1:  PT Short Term Goal 1 (Week 1): Pt will transfer bed to/from chair w/supervision PT Short Term Goal 2 (Week 1): Pt will ambulate >28ft on level surfaces w/cga PT Short Term Goal 3 (Week 1): Pt will perform simulated household ambulation w/cga and cues <50% time to attend to obstacles/R environment PT Short Term Goal 4 (Week 1): Pt will demonstrate controlled R hand placement during sit to stand and transfers w/cues <50% time needed. PT Short Term Goal 5 (Week 1): Pt will tolerate either BERG or Functional Gait assessment  Skilled Therapeutic Interventions/Progress Updates:     Patient in recliner in the room upon PT arrival. Patient alert and agreeable to PT session. Patient denied pain during session. Patient reported good sleep last night due to increased fatigue following therapies yesterday.   Therapeutic Activity: Transfers: Patient performed sit to/from stand independently from standard arm chair and recliner throughout session.   Gait Training:  Patient ambulated with supervision around the unit. Focused on increased gait speed, R body and spatial awareness, and path finding to Day Room, Main Gym, and patient's room without cues.  TUG: 10.9 (<12 sec indicates reduced risk for falls) TUG motor (holding full cup of water in R hand): 29.8 TUG cognitive (counting backwards x3): 22.3, 1/1 correct response Cognitive count (seated): 2/3 correct responses in 22.3 sec  Neuromuscular Re-ed: Patient performed the following activities: -Tapping 4 different colored cones in a semi-circle with R x 2 min and L x2 min foot based on therapist's cues for which color cone, patient cued to state one item from 4 categories, coordinated to different colored cones  (sports, fruits/veggies, animals, colors), required min A-CGA throughout with increased R lean with L or R foot to the L -Interval training: weaving through 6 cones 2 ft apart while holding a ball, rebounder with mesh ball x5, walking 10 ft, place ball on 7" step, ascend/descend 4-6 inch steps 1 rail, 2 lateral step-ups R/L 1 rail, pick up ball, return to sitting  Provided short intermittent rest breaks between activities. Educated on focus and deficits targeted with each activity during rest breaks. Patient with good incite into deficits upon reflection.   Patient in recliner in the room at end of session with breaks locked, chair alarm set, and all needs within reach.    Therapy Documentation Precautions:  Precautions Precautions: Fall Precaution Comments: MILDLY impulsive, decreased safety awarenness, R inattention Restrictions Weight Bearing Restrictions: No   Therapy/Group: Individual Therapy  Jase Reep L Anis Cinelli PT, DPT  12/23/2020, 4:42 PM

## 2020-12-23 NOTE — Progress Notes (Signed)
Speech Language Pathology Weekly Progress and Session Note  Patient Details  Name: Dustin Golden MRN: [id number] Date of Birth: [birth year]  Beginning of progress report period: December 17, 2020 End of progress report period: December 23, 2020  Today's Date: 12/23/2020 SLP Individual Time: 1205-1300 SLP Individual Time Calculation (min): 55 min  Short Term Goals: Week 1: SLP Short Term Goal 1 (Week 1): Pt will use overarticulation, loud voice, and slow rate of speech with min assist verbal cues to achieve intelligibility at the sentence level in the presence of mild background noise or other physical/visual barriers. SLP Short Term Goal 1 - Progress (Week 1): Met SLP Short Term Goal 2 (Week 1): Pt will use compensatory word finding stratgies during both structured and unstructured language tasks with supervision verbal cues. SLP Short Term Goal 2 - Progress (Week 1): Met SLP Short Term Goal 3 (Week 1): Pt will consume dys 3 textures and thin liquids with mod I use of swallowing strategies and minimal overt s/s of aspiration. SLP Short Term Goal 3 - Progress (Week 1): Met SLP Short Term Goal 4 (Week 1): Pt will consume therapeutic trials of regular textures with supervision cues for use of swallowing precautions and minimal overt s/s of aspiration. SLP Short Term Goal 4 - Progress (Week 1): Met    New Short Term Goals: Week 2: SLP Short Term Goal 1 (Week 2): STGs=LTGs due to ELOS  Weekly Progress Updates: Patient has made excellent gains and has met 4 of 4 STGs this reporting period. Currently, patient is consuming regular textures with minimal overt s/s of aspiration and is overall Mod I for use of swallowing compensatory strategies. Patient also requires overall Min A verbal cues for use of speech intelligibility strategies to maximize intelligibility to ~80% at the sentence level. Patient demonstrates improved word-finding at the conversation level nad requires overall supervision level verbal  cues. Patient and family education ongoing. Patient would benefit from continued skilled SLP intervention to maximize his functional communication and swallowing function prior to discharge.      Intensity: Minumum of 1-2 x/day, 30 to 90 minutes Frequency: 3 to 5 out of 7 days Duration/Length of Stay: 12/29/19 Treatment/Interventions: Cognitive remediation/compensation;Cueing hierarchy;Dysphagia/aspiration precaution training;Functional tasks;Internal/external aids;Patient/family education;Speech/Language facilitation;Therapeutic Activities;Environmental controls   Daily Session  Skilled Therapeutic Interventions: Skilled treatment session focused on dysphagia and speech goals. SLP facilitated session by bringing the patient to the cafeteria to select and order a meal of regular textures. Due to moderate dysarthria in a moderately noisy environment with patient's mask in place, multiple repetitions were needed for the unfamiliar communication partner. SLP facilitated session by providing overall supervision level verbal cues for use of small bites and to decrease verbosity during PO intake. Patient required intermittent verbal cues to self-monitor and correct right buccal pocketing and anterior spillage with only 1 cough noted, suspect due to large bolus. Recommend patient upgrade to regular textures with full supervision. Throughout session, Min verbal cues were needed for use of speech intelligibility strategies at the conversation level to achieve ~80% intelligibility. Patient left upright in recliner with alarm on and all needs within reach. Continue with current plan of care.       Pain No/Denies Pain   Therapy/Group: Individual Therapy  Hadassa Cermak 12/23/2020, 6:25 AM

## 2020-12-23 NOTE — Progress Notes (Signed)
Occupational Therapy Session Note  Patient Details  Name: Dustin Golden MRN: [id number] Date of Birth: [birth year]  Today's Date: 12/23/2020 OT Individual Time: 1100-1200 OT Individual Time Calculation (min): 60 min    Short Term Goals: Week 1:  OT Short Term Goal 1 (Week 1): Pt will transfer to toilet wiht CGA OT Short Term Goal 2 (Week 1): Pt will don B socks with S OT Short Term Goal 3 (Week 1): Pt will thread BLE into pants wiht S OT Short Term Goal 4 (Week 1): Pt will use RUE to complete oral care  Skilled Therapeutic Interventions/Progress Updates:    1:1. Pt received in recliner agreeable to OT. Pt completes all mobility with no AD and S-CGA overall no overt LOB noted. VC for narrower BOS during sut to stand. Pt completes toielting with S standing to void bladder. In tx gym pt works on thumb/index opposition, thumb opposition and thumb flexion. Pt with habitual key pinch grasp with RUE therefore moved to kitchen and worked on cylindrical grasp around cups in PNF diagonals with VC for "gentle/slow" grasp. Pt opens cabinet doors/refridgerator for NMR/grasp and release with VC for posture and decreasing compensatory movements. Exited session with pt seated in recliner, exit alarm on and call light in reach   Therapy Documentation Precautions:  Precautions Precautions: Fall Precaution Comments: MILDLY impulsive, decreased safety awarenness, R inattention Restrictions Weight Bearing Restrictions: No General:   Vital Signs: Therapy Vitals Temp: 98 F (36.7 C) Temp Source: Oral Pulse Rate: 71 Resp: 18 BP: 119/81 Patient Position (if appropriate): Lying Oxygen Therapy SpO2: (!) 71 % O2 Device: Room Air Pain:   ADL: ADL Grooming: Minimal assistance Where Assessed-Grooming: Standing at sink Upper Body Bathing: Minimal assistance Where Assessed-Upper Body Bathing: Shower Lower Body Bathing: Moderate assistance Where Assessed-Lower Body Bathing: Shower Upper Body Dressing:  Minimal assistance Where Assessed-Upper Body Dressing: Sitting at sink Lower Body Dressing: Maximal assistance Where Assessed-Lower Body Dressing: Sitting at sink,Standing at sink [employer] Transfer: Minimal assistance Social research officer, government Method: Ambulating Vision   Perception    Praxis   Exercises:   Other Treatments:     Therapy/Group: Individual Therapy  Tonny Branch 12/23/2020, 6:53 AM

## 2020-12-23 NOTE — Progress Notes (Signed)
PROGRESS NOTE   Subjective/Complaints: He is discussing with Lattie Haw rec therapy his history and what he enjoyed doing.  He complains of shortness of breath a couple of times per day that comes on suddenly when he is resting and lasts about three minutes. Discussed differential and ordering an incentive spirometer and CXR.   ROS: denies pain, constipation, +SOB   Objective:   No results found. No results for input(s): WBC, HGB, HCT, PLT in the last 72 hours. Recent Labs    12/21/20 0545  NA 134*  K 4.1  CL 101  CO2 23  GLUCOSE 103*  BUN 13  CREATININE 1.11  CALCIUM 9.0    Intake/Output Summary (Last 24 hours) at 12/23/2020 1020 Last data filed at 12/23/2020 0825 Gross per 24 hour  Intake 600 ml  Output 1100 ml  Net -500 ml        Physical Exam: Vital Signs Blood pressure 119/81, pulse 71, temperature 98 F (36.7 C), temperature source Oral, resp. rate 18, height 6\' 4"  (1.93 m), weight 83.4 kg, SpO2 (!) 71 %. Gen: no distress, normal appearing HEENT: oral mucosa pink and moist, NCAT Cardio: Reg rate Chest: normal effort, normal rate of breathing Abd: soft, non-distended Ext: no edema Psych: pleasant, normal affect Skin: intact Neurologic: Cranial nerves II through XII intact, motor strength is 5/5 in left, 4+/5 right deltoid, bicep, tricep, grip, hip flexor, knee extensors, ankle dorsiflexor and plantar flexor Sensory exam normal sensation to light touch and proprioception in bilateral upper and lower extremities Cerebellar exam mild dysmetria on the right side compared to the left side  Severe fine motor deficits on the right side, decreased motor control on the right side unable to gauge grasp musculoskeletal: Full range of motion in all 4 extremities. No joint swelling   Assessment/Plan: 1. Functional deficits which require 3+ hours per day of interdisciplinary therapy in a comprehensive inpatient rehab  setting.  Physiatrist is providing close team supervision and 24 hour management of active medical problems listed below.  Physiatrist and rehab team continue to assess barriers to discharge/monitor patient progress toward functional and medical goals  Care Tool:  Bathing    Body parts bathed by patient: Left arm,Chest,Abdomen,Front perineal area,Right upper leg,Left upper leg,Face,Right arm,Buttocks,Right lower leg,Left lower leg   Body parts bathed by helper: Right arm,Buttocks,Right lower leg,Left lower leg     Bathing assist Assist Level: Contact Guard/Touching assist     Upper Body Dressing/Undressing Upper body dressing   What is the patient wearing?: Pull over shirt    Upper body assist Assist Level: Supervision/Verbal cueing    Lower Body Dressing/Undressing Lower body dressing      What is the patient wearing?: Pants,Underwear/pull up     Lower body assist Assist for lower body dressing: Contact Guard/Touching assist     Toileting Toileting    Toileting assist Assist for toileting: Contact Guard/Touching assist     Transfers Chair/bed transfer  Transfers assist     Chair/bed transfer assist level: Supervision/Verbal cueing     Locomotion Ambulation   Ambulation assist      Assist level: Supervision/Verbal cueing Assistive device: No Device Max distance: >200 ft  Walk 10 feet activity   Assist     Assist level: Supervision/Verbal cueing Assistive device: No Device   Walk 50 feet activity   Assist    Assist level: Supervision/Verbal cueing Assistive device: No Device    Walk 150 feet activity   Assist    Assist level: Supervision/Verbal cueing Assistive device: No Device    Walk 10 feet on uneven surface  activity   Assist     Assist level: Minimal Assistance - Patient > 75% Assistive device: Hand held assist   Wheelchair     Assist Will patient use wheelchair at discharge?: No              Wheelchair 50 feet with 2 turns activity    Assist            Wheelchair 150 feet activity     Assist          Blood pressure 119/81, pulse 71, temperature 98 F (36.7 C), temperature source Oral, resp. rate 18, height 6\' 4"  (1.93 m), weight 83.4 kg, SpO2 (!) 71 %.  Medical Problem List and Plan: 1. 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 Status post mechanical thrombectomy -patient may shower -ELOS/Goals: modI in 5-7 days -Continue CIR 2. Antithrombotics: -DVT/anticoagulation: Continue SCDs, ambulating >150 feet -antiplatelet therapy: Continue Aspirin 81 mg daily and Brilinta 90 mg twice daily 3. Pain Management: Continue Tylenol as needed- last required 3/6 4. Mood: Provide emotional support -antipsychotic agents: N/A 5. Neuropsych: This patient is capable of making decisions on his own behalf. 6. Skin/Wound Care: Routine skin checks 7. Fluids/Electrolytes/Nutrition: Routine in and outs with follow-up chemistries 8. Hypertension. 3/9: diastolic BP elevated: increase amlodipine to 10mg .  9. History of alcohol tobacco as well as polysubstance abuse. Provide counseling 10. Hyperlipidemia. Continue Zocor 11. Mechanical fall 12/15/2020. Sustained small abrasion right knee and large right facial scalp hematoma. Follow-up CT scan unchanged #12.  Patient reported discharge from right eye yesterday morning, started on tobramycin ophthalmic drops.  Continue x7 days on a 4 times daily basis 13. AKI: Cr up to 1.31- placed nursing order to encourage hydration, repeat tomorrow.   3/8: normalized, repeat outpatient, continue to encourage hydration 14. Shortness of breath: CXR ordered. Recommend incentive spirometer to improve breathing strength and ability over time.      LOS: 6 days A FACE TO FACE EVALUATION WAS  PERFORMED  Kellsey Sansone P Jasani Dolney 12/23/2020, 10:20 AM

## 2020-12-23 NOTE — Evaluation (Signed)
Recreational Therapy Assessment and Plan  Patient Details  Name: Dustin Golden MRN: [id number] Date of Birth: 02/02/1956 Today's Date: 12/23/2020  Rehab Potential:   Good ELOS:  d/c 3/15   Hospital Problem: Active Problems:   Left middle cerebral artery stroke (HCC)   Past Medical History:      Past Medical History:  Diagnosis Date  . Hypertension    Past Surgical History:       Past Surgical History:  Procedure Laterality Date  . APPENDECTOMY    . gastric ulcer surgery    . IR CT HEAD LTD  12/13/2020  . IR INTRAVSC STENT CERV CAROTID W/EMB-PROT MOD SED INCL ANGIO  12/14/2020  . IR PERCUTANEOUS ART THROMBECTOMY/INFUSION INTRACRANIAL INC DIAG ANGIO  12/13/2020  . IR US GUIDE VASC ACCESS RIGHT  12/13/2020  . RADIOLOGY WITH ANESTHESIA N/A 12/13/2020   Procedure: IR WITH ANESTHESIA;  Surgeon: Deveshwar, Sanjeev, MD;  Location: [facility] OR;  Service: Radiology;  Laterality: N/A;    Assessment & Plan Clinical Impression: Dustin Golden is a 64-year-old right-handed male with history of tobacco and alcohol use and marijuana on no prescription medications. Per chart review lives alone independent prior to admission working as a truck driver. 1 level home. Presented 12/13/2020 with acute onset of right-sided weakness and aphasia. Admission chemistries unremarkable except glucose 117 creatinine 1.28 alcohol negative, urine drug screen positive cocaine as well as marijuana. Cranial CT scan negative. Patient did not receive TPA. 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 30 to 50% stenosis of both vertebral artery origins. Patient underwent emergent mechanical thrombectomy per interventional radiology. MRI/MRA showed acute infarction left posterior MCA distribution involving the posterior insula and the left frontal parietal cortex. Small areas of acute  infarction in the frontal lobes bilaterally. MRA demonstrated loss of signal in the right posterior cerebral artery. Echocardiogram with ejection fraction of 35 to 40% grade 1 diastolic dysfunction. Neurology follow-up currently maintained on aspirin as well as Brilinta for CVA prophylaxis. Maintain on a mechanical soft thin liquid diet. Patient did have a reported fall 12/15/2020 sustaining small abrasion on the right knee and large right facial scalp hematoma patient remained awake alert and interactive unchanged from baseline with follow-up per neurology services and received follow-up cranial CT scan after a fall redemonstrating moderate to large acute early subacute cortical and subcortical left MCA infarct no acute changes. Therapy evaluations completed due to patient's right side weakness and aphasia was admitted for a comprehensive rehab program. Patient transferred to CIR on 12/17/2020.   Met with pt today to discuss leisure interests, activity analysis/activity modification and stress management/relaxation.  Pt is anxious for discharge home and feels that he can easily return to some of his previously enjoyed activities with extra time and little modification.  Pt presents with decreased activity tolerance, decreased functional mobility, decreased balance, decreased coordination , decreased vision Limiting pt's independence with leisure/community pursuits.   Plan  No further TR, pt discharging 3/15  Recommendations for other services: None   Discharge Criteria: Patient will be discharged from TR if patient refuses treatment 3 consecutive times without medical reason.  If treatment goals not met, if there is a change in medical status, if patient makes no progress towards goals or if patient is discharged from hospital.  The above assessment, treatment plan, treatment alternatives and goals were discussed and mutually agreed upon:   by patient  , 12/23/2020, 3:10 PM  

## 2020-12-24 NOTE — Progress Notes (Signed)
Physical Therapy Session Note  Patient Details  Name: Dustin Golden MRN: [id number] Date of Birth: 1956/08/11  Today's Date: 12/24/2020 PT Individual Time: 0800-0900 PT Individual Time Calculation (min): 60 min   Short Term Goals: Week 1:  PT Short Term Goal 1 (Week 1): Pt will transfer bed to/from chair w/supervision PT Short Term Goal 2 (Week 1): Pt will ambulate >273ft on level surfaces w/cga PT Short Term Goal 3 (Week 1): Pt will perform simulated household ambulation w/cga and cues <50% time to attend to obstacles/R environment PT Short Term Goal 4 (Week 1): Pt will demonstrate controlled R hand placement during sit to stand and transfers w/cues <50% time needed. PT Short Term Goal 5 (Week 1): Pt will tolerate either BERG or Functional Gait assessment Week 2:    Week 3:     Skilled Therapeutic Interventions/Progress Updates:    Pain:  Pt reports no pain.  Treatment to tolerance.  Rest breaks and repositioning as needed.  Pt initially oob in recliner and agreeable to treatment session w/focus on functional gait, dual task challenges, activities to promote attention to R hemibody/environment.  Gait >145ft to gym w/supervision only, noted mild R inattention exp to position of RUE w/task, .  Functional Gait: Pt tasked w/locating cards 1-10 placed on wall to R, grasping cards w/R hand, transferring cards to L hand then handing to therapist at far left followed by 10 cones on floor w/R hand and stacking w/R hand.  cga for balance, Pt repeated course x 2 w/signficianct improvement in efficiency, decreased perceptual errors, decreased grasping errors, decreased episodes of dropping cards/cones w/second pass.  Seated ball bounce/dual UEs, coordination task improves w/reptition.  Progressed to perfoming in standing allowing pt to stabilize ball by catching against body progressed to performing without body assist progressed to sitting single RUE dribbling then progressed to standing dribbling,  errors decrease w/repetition.  Gait + lateral toss/catch w/therapist performed while walking back to room. Using bilat UEs for toss/catch. No balance loss, able to coordinate stable gait + dual UE task w/cga.  At end of session, Pt left oob in recliner w/chair alarm set and needs in reach.  Therapy Documentation Precautions:  Precautions Precautions: Fall Precaution Comments: MILDLY impulsive, decreased safety awarenness, R inattention Restrictions Weight Bearing Restrictions: No    Therapy/Group: Individual Therapy  Callie Fielding, [place] 12/24/2020, 4:01 PM

## 2020-12-24 NOTE — Progress Notes (Signed)
PROGRESS NOTE   Subjective/Complaints: He has a very positive outlook Has no complaint this morning Asks about his CXR results and discussed that they show no acute process but show expansion likely secondary to COPD.   ROS: denies pain, constipation, SOB   Objective:   DG Chest 2 View  Result Date: 12/23/2020 CLINICAL DATA:  Shortness of breath. EXAM: CHEST - 2 VIEW COMPARISON:  Single-view of the chest 12/16/2020. FINDINGS: The chest is somewhat hyperexpanded but the lungs are clear. Heart size is normal. No pneumothorax or pleural effusion. No acute or focal bony abnormality. IMPRESSION: No acute disease. Pulmonary hyperexpansion suggestive of COPD. Electronically Signed   By: Inge Rise M.D.   On: 12/23/2020 13:51   No results for input(s): WBC, HGB, HCT, PLT in the last 72 hours. No results for input(s): NA, K, CL, CO2, GLUCOSE, BUN, CREATININE, CALCIUM in the last 72 hours.  Intake/Output Summary (Last 24 hours) at 12/24/2020 1113 Last data filed at 12/24/2020 0800 Gross per 24 hour  Intake 820 ml  Output 1200 ml  Net -380 ml        Physical Exam: Vital Signs Blood pressure 125/80, pulse 70, temperature 98 F (36.7 C), temperature source Oral, resp. rate 17, height 6\' 4"  (1.93 m), weight 83.4 kg, SpO2 100 %. Gen: no distress, normal appearing HEENT: oral mucosa pink and moist, NCAT Cardio: Reg rate Chest: normal effort, normal rate of breathing Abd: soft, non-distended Ext: no edema Psych: pleasant, normal affect Skin: intact Neurologic: Cranial nerves II through XII intact, motor strength is 5/5 in left, 4+/5 right deltoid, bicep, tricep, grip, hip flexor, knee extensors, ankle dorsiflexor and plantar flexor Sensory exam normal sensation to light touch and proprioception in bilateral upper and lower extremities Cerebellar exam mild dysmetria on the right side compared to the left side  Severe fine motor  deficits on the right side, decreased motor control on the right side unable to gauge grasp musculoskeletal: Full range of motion in all 4 extremities. No joint swelling   Assessment/Plan: 1. Functional deficits which require 3+ hours per day of interdisciplinary therapy in a comprehensive inpatient rehab setting.  Physiatrist is providing close team supervision and 24 hour management of active medical problems listed below.  Physiatrist and rehab team continue to assess barriers to discharge/monitor patient progress toward functional and medical goals  Care Tool:  Bathing    Body parts bathed by patient: Left arm,Chest,Abdomen,Front perineal area,Right upper leg,Left upper leg,Face,Right arm,Buttocks,Right lower leg,Left lower leg   Body parts bathed by helper: Right arm,Buttocks,Right lower leg,Left lower leg     Bathing assist Assist Level: Contact Guard/Touching assist     Upper Body Dressing/Undressing Upper body dressing   What is the patient wearing?: Pull over shirt    Upper body assist Assist Level: Supervision/Verbal cueing    Lower Body Dressing/Undressing Lower body dressing      What is the patient wearing?: Pants,Underwear/pull up     Lower body assist Assist for lower body dressing: Contact Guard/Touching assist     Toileting Toileting    Toileting assist Assist for toileting: Contact Guard/Touching assist     Transfers Chair/bed transfer  Transfers  assist     Chair/bed transfer assist level: Supervision/Verbal cueing     Locomotion Ambulation   Ambulation assist      Assist level: Supervision/Verbal cueing Assistive device: No Device Max distance: >200 ft   Walk 10 feet activity   Assist     Assist level: Supervision/Verbal cueing Assistive device: No Device   Walk 50 feet activity   Assist    Assist level: Supervision/Verbal cueing Assistive device: No Device    Walk 150 feet activity   Assist    Assist level:  Supervision/Verbal cueing Assistive device: No Device    Walk 10 feet on uneven surface  activity   Assist     Assist level: Minimal Assistance - Patient > 75% Assistive device: Hand held assist   Wheelchair     Assist Will patient use wheelchair at discharge?: No             Wheelchair 50 feet with 2 turns activity    Assist            Wheelchair 150 feet activity     Assist          Blood pressure 125/80, pulse 70, temperature 98 F (36.7 C), temperature source Oral, resp. rate 17, height 6\' 4"  (1.93 m), weight 83.4 kg, SpO2 100 %.  Medical Problem List and Plan: 1. 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 Status post mechanical thrombectomy -patient may shower -ELOS/Goals: modI in 5-7 days -Continue CIR 2. Antithrombotics: -DVT/anticoagulation: Continue SCDs, ambulating >150 feet -antiplatelet therapy: Continue Aspirin 81 mg daily and Brilinta 90 mg twice daily 3. Pain Management: Continue Tylenol as needed- last required 3/6 4. Mood: Provide emotional support -antipsychotic agents: N/A 5. Neuropsych: This patient is capable of making decisions on his own behalf. 6. Skin/Wound Care: Routine skin checks 7. Fluids/Electrolytes/Nutrition: Routine in and outs with follow-up chemistries 8. Hypertension. 3/11: BP under much better controlled, continue amlodipine to 10mg .  9. History of alcohol tobacco as well as polysubstance abuse. Provide counseling 10. Hyperlipidemia. Continue Zocor 11. Mechanical fall 12/15/2020. Sustained small abrasion right knee and large right facial scalp hematoma. Follow-up CT scan unchanged #12.  Patient reported discharge from right eye yesterday morning, started on tobramycin ophthalmic drops.  Continue x7 days on a 4 times daily basis 13. AKI: Cr up to 1.31- placed  nursing order to encourage hydration, repeat tomorrow.   3/8: normalized, repeat outpatient, continue to encourage hydration 14. Shortness of breath: CXR reviewed with patient: shows no acute pulmonary process, but does show expansion related to COPD. Ordered incentive spirometer to improve breathing strength and ability over time.      LOS: 7 days A FACE TO FACE EVALUATION WAS PERFORMED  Krutika P Raulkar 12/24/2020, 11:13 AM

## 2020-12-24 NOTE — Progress Notes (Signed)
Occupational Therapy Session Note  Patient Details  Name: REMBERTO LIENHARD MRN: [id number] Date of Birth: 24-Jul-1956  Today's Date: 12/24/2020 OT Group Time: 1115-1200 OT Group Time Calculation (min): 45 min    Today's Date: 12/24/2020 OT Individual Time: 1300-1325 OT Individual Time Calculation (min): 25 min    Short Term Goals: Week 1:  OT Short Term Goal 1 (Week 1): Pt will transfer to toilet wiht CGA OT Short Term Goal 2 (Week 1): Pt will don B socks with S OT Short Term Goal 3 (Week 1): Pt will thread BLE into pants wiht S OT Short Term Goal 4 (Week 1): Pt will use RUE to complete oral care  Skilled Therapeutic Interventions/Progress Updates:    Pt participated in rhythmic drumming group. No pain reported. Focus of group on BUE coordination, strengthening, endurance, timing/control, activity tolerance, and social participation and engagement. Pt performs session from seated position for energy conservation. Skilled interventions included built up foam handle for RUE as well as coban around RUE to improve grasp d/t R inattention and slower paced movements to access timing and rhythm for NMR. Warm up performed prior to exercises and UB stretching completed at end of group with demo from OT. Pt able to select preferred song to share with group. Returned pt to room at end of session. Exited session with pt seated in recliner, exit alarm on and call light in reach  Session 2: pt received in recliner agreeable to shower. Pt completes all gathering of clothing with S for mobility and no VC for recalling needed items. Pt showers at sit to stand level with VC for wash cloth use in RUE to wash instead of bar of soap as it repeatedly slips out of hand. Pt dresses sit to stand at toilet with S overall and dons non skid socks upon return to the recliner. Pt making good progress with min VC for incorporation of RUE into tasks using adaptive technique. Exited session with pt seated in recliner, exit alarm on  and call light in reach. No pain throughotu session   Therapy Documentation Precautions:  Precautions Precautions: Fall Precaution Comments: MILDLY impulsive, decreased safety awarenness, R inattention Restrictions Weight Bearing Restrictions: No General:   Vital Signs: Therapy Vitals Temp: 98 F (36.7 C) Temp Source: Oral Pulse Rate: 70 Resp: 17 BP: 125/80 Patient Position (if appropriate): Lying Oxygen Therapy SpO2: 100 % O2 Device: Room Air Pain:   ADL: ADL Grooming: Minimal assistance Where Assessed-Grooming: Standing at sink Upper Body Bathing: Minimal assistance Where Assessed-Upper Body Bathing: Shower Lower Body Bathing: Moderate assistance Where Assessed-Lower Body Bathing: Shower Upper Body Dressing: Minimal assistance Where Assessed-Upper Body Dressing: Sitting at sink Lower Body Dressing: Maximal assistance Where Assessed-Lower Body Dressing: Sitting at sink,Standing at sink [employer] Transfer: Minimal assistance Social research officer, government Method: Ambulating Vision   Perception    Praxis   Exercises:   Other Treatments:     Therapy/Group: Group Therapy and [place] 12/24/2020, 6:43 AM

## 2020-12-24 NOTE — Progress Notes (Signed)
Speech Language Pathology Daily Session Note  Patient Details  Name: Dustin Golden MRN: [id number] Date of Birth: November 05, 1955  Today's Date: 12/24/2020 SLP Individual Time: 1030-1110 SLP Individual Time Calculation (min): 40 min  Short Term Goals: Week 2: SLP Short Term Goal 1 (Week 2): STGs=LTGs due to ELOS  Skilled Therapeutic Interventions: Skilled treatment session focused on communication goals. SLP facilitated session by providing overall Mod A verbal, phonemic and articulatory cues to self-monitor and correct motor speech errors at the word level while reading CVC combinations aloud. However, patient with increased speech intelligibility at the sentence level during an informal verbal description task. No deficits in word-finding noted. Patient ambulated to the dayroom and handed off to OT. Continue with current plan of care.      Pain No/Denies Pain   Therapy/Group: Individual Therapy  Dustin Golden 12/24/2020, 2:36 PM

## 2020-12-25 DIAGNOSIS — H5789 Other specified disorders of eye and adnexa: Secondary | ICD-10-CM

## 2020-12-25 DIAGNOSIS — N179 Acute kidney failure, unspecified: Secondary | ICD-10-CM

## 2020-12-25 DIAGNOSIS — E871 Hypo-osmolality and hyponatremia: Secondary | ICD-10-CM

## 2020-12-25 DIAGNOSIS — I10 Essential (primary) hypertension: Secondary | ICD-10-CM

## 2020-12-25 NOTE — Progress Notes (Signed)
PROGRESS NOTE   Subjective/Complaints: Patient seen sitting up in bed this morning.  He states he slept well overnight.    ROS: Denies CP, SOB, N/V/D  Objective:   DG Chest 2 View  Result Date: 12/23/2020 CLINICAL DATA:  Shortness of breath. EXAM: CHEST - 2 VIEW COMPARISON:  Single-view of the chest 12/16/2020. FINDINGS: The chest is somewhat hyperexpanded but the lungs are clear. Heart size is normal. No pneumothorax or pleural effusion. No acute or focal bony abnormality. IMPRESSION: No acute disease. Pulmonary hyperexpansion suggestive of COPD. Electronically Signed   By: Inge Rise M.D.   On: 12/23/2020 13:51   No results for input(s): WBC, HGB, HCT, PLT in the last 72 hours. No results for input(s): NA, K, CL, CO2, GLUCOSE, BUN, CREATININE, CALCIUM in the last 72 hours.  Intake/Output Summary (Last 24 hours) at 12/25/2020 1012 Last data filed at 12/25/2020 0723 Gross per 24 hour  Intake 960 ml  Output 950 ml  Net 10 ml        Physical Exam: Vital Signs Blood pressure 131/84, pulse 70, temperature 98 F (36.7 C), resp. rate 18, height 6\' 4"  (1.93 m), weight 83.4 kg, SpO2 100 %. Constitutional: No distress . Vital signs reviewed. HENT: Small right-sided area of swelling.  Eyes: EOMI. No discharge. Cardiovascular: No JVD.  RRR. Respiratory: Normal effort.  No stridor.  Bilateral clear to auscultation. GI: Non-distended.  BS +. Skin: Warm and dry.  Intact. Psych: Normal mood.  Normal behavior. Musc: No edema in extremities.  No tenderness in extremities. Neuro: Alert Motor: LUE/LE: 5/5 proximal distal RUE/RLE: 4/5 proximal distal   Assessment/Plan: 1. Functional deficits which require 3+ hours per day of interdisciplinary therapy in a comprehensive inpatient rehab setting.  Physiatrist is providing close team supervision and 24 hour management of active medical problems listed below.  Physiatrist and rehab  team continue to assess barriers to discharge/monitor patient progress toward functional and medical goals  Care Tool:  Bathing    Body parts bathed by patient: Left arm,Chest,Abdomen,Front perineal area,Right upper leg,Left upper leg,Face,Right arm,Buttocks,Right lower leg,Left lower leg   Body parts bathed by helper: Right arm,Buttocks,Right lower leg,Left lower leg     Bathing assist Assist Level: Contact Guard/Touching assist     Upper Body Dressing/Undressing Upper body dressing   What is the patient wearing?: Pull over shirt    Upper body assist Assist Level: Supervision/Verbal cueing    Lower Body Dressing/Undressing Lower body dressing      What is the patient wearing?: Pants,Underwear/pull up     Lower body assist Assist for lower body dressing: Contact Guard/Touching assist     Toileting Toileting    Toileting assist Assist for toileting: Contact Guard/Touching assist     Transfers Chair/bed transfer  Transfers assist     Chair/bed transfer assist level: Supervision/Verbal cueing     Locomotion Ambulation   Ambulation assist      Assist level: Supervision/Verbal cueing Assistive device: No Device Max distance: >200 ft   Walk 10 feet activity   Assist     Assist level: Supervision/Verbal cueing Assistive device: No Device   Walk 50 feet activity   Assist  Assist level: Supervision/Verbal cueing Assistive device: No Device    Walk 150 feet activity   Assist    Assist level: Supervision/Verbal cueing Assistive device: No Device    Walk 10 feet on uneven surface  activity   Assist     Assist level: Minimal Assistance - Patient > 75% Assistive device: Hand held assist   Wheelchair     Assist Will patient use wheelchair at discharge?: No             Wheelchair 50 feet with 2 turns activity    Assist            Wheelchair 150 feet activity     Assist          Blood pressure 131/84,  pulse 70, temperature 98 F (36.7 C), resp. rate 18, height 6\' 4"  (1.93 m), weight 83.4 kg, SpO2 100 %.  Medical Problem List and Plan: 1. 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 Status post mechanical thrombectomy  Continue CIR 2. Antithrombotics: -DVT/anticoagulation: Continue SCDs, ambulating >150 feet -antiplatelet therapy: Continue Aspirin 81 mg daily and Brilinta 90 mg twice daily 3. Pain Management: Continue Tylenol as needed- last required 3/6 4. Mood: Provide emotional support -antipsychotic agents: N/A 5. Neuropsych: This patient is capable of making decisions on his own behalf. 6. Skin/Wound Care: Routine skin checks 7. Fluids/Electrolytes/Nutrition: Routine in and outs 8. Hypertension.   Continue amlodipine to 10mg .   Controlled on 3/12 9. History of alcohol tobacco as well as polysubstance abuse. Provide counseling 10. Hyperlipidemia. Continue Zocor 11. Mechanical fall 12/15/2020. Sustained small abrasion right knee and large right facial scalp hematoma. Follow-up CT scan unchanged 12.  Patient reported discharge from right eye   Started on tobramycin ophthalmic drops.  Continue x7 days on a 4 times daily basis 13. AKI:   Creatinine 1.11 on 3/8  Continue to encourage fluids 14. 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 Ordered incentive spirometer to improve breathing strength and ability over time.  15.  Hyponatremia  Sodium 134 on 3/8, labs ordered for Monday   LOS: 8 days A FACE TO FACE EVALUATION WAS PERFORMED  Dorathy Stallone Lorie Phenix 12/25/2020, 10:12 AM

## 2020-12-25 NOTE — Progress Notes (Signed)
Occupational Therapy Session Note  Patient Details  Name: Dustin Golden MRN: [id number] Date of Birth: 05-02-1956  Today's Date: 12/25/2020 OT Individual Time: 1515-1600 OT Individual Time Calculation (min): 45 min- session 2  Session 1 60  Min 700-800     Short Term Goals: Week 1:  OT Short Term Goal 1 (Week 1): Pt will transfer to toilet wiht CGA OT Short Term Goal 2 (Week 1): Pt will don B socks with S OT Short Term Goal 3 (Week 1): Pt will thread BLE into pants wiht S OT Short Term Goal 4 (Week 1): Pt will use RUE to complete oral care  Skilled Therapeutic Interventions/Progress Updates:     Session 2  Pt received bathroom with NT present having BM. Pt requesting to shower ADL:  Pt completes bathing with supervision and VC for forced use of RUE during bathing tasks.  Pt completes UB dressing with S and increased time Pt completes LB dressing with VC for seated doffing of pants over feet and use of R hand in fist to pull pants past R hip Pt completes footwear with S Pt completes toileting with s/u Pt completes toileting transfer with S/u Pt completes shower/Tub transfer with S in room bathroom. Pt discusses tub transfer stepping into tub and getting down into tub. Pt practices getting down into tub with CGA overall and VC for hemi technique and using tub ledge and wall. Pt able to practice a second time without cuing to recall technique. Educated if pt wants to get down into tub he should have someone with him for getting in/out and drain water/dry off tub bottom before getting out.  Pt left at end of session in recliner with exit alarm on, call light in reach and all needs met    Session 1  Pt received in recliner with no pain reported ADL:  Pt completes toileting at set up level via ambulation with no AD and standing at toilet to void urine. Pt completes self feeding breakfast with red foam handle issued and various trials of tripod grasp (unable) v gross grasp on handle  to use spoon to self feed grip. Educated on visualization prior to eating and imagine RUE gently grasping food to help with NMR. Pt able ot pick up muffin without crushing with attention to RUE. Pt drops spoon on floor 2x d/t decreased R attention, however grip remains strong. Also provided education on coping/neuropsych and acknowledgement of feelings during recovery from CVA. Pt with no overt s/s of aspiration but does need occasional cuing to swallow before talking and wiping R corner of mouth.  Pt left at end of session in recliner with exit alarm on, call light in reach and all needs met   Therapy Documentation Precautions:  Precautions Precautions: Fall Precaution Comments: MILDLY impulsive, decreased safety awarenness, R inattention Restrictions Weight Bearing Restrictions: No General:   Vital Signs: Therapy Vitals Temp: 98 F (36.7 C) Pulse Rate: 70 Resp: 18 BP: 131/84 Patient Position (if appropriate): Lying Oxygen Therapy SpO2: 100 % O2 Device: Room Air Pain:   ADL: ADL Grooming: Minimal assistance Where Assessed-Grooming: Standing at sink Upper Body Bathing: Minimal assistance Where Assessed-Upper Body Bathing: Shower Lower Body Bathing: Moderate assistance Where Assessed-Lower Body Bathing: Shower Upper Body Dressing: Minimal assistance Where Assessed-Upper Body Dressing: Sitting at sink Lower Body Dressing: Maximal assistance Where Assessed-Lower Body Dressing: Sitting at sink,Standing at sink Social research officer, government: Minimal assistance Social research officer, government Method: Herbalist  Exercises:   Other Treatments:     Therapy/Group: Individual Therapy  Tonny Branch 12/25/2020, 6:48 AM

## 2020-12-25 NOTE — Progress Notes (Signed)
Speech Language Pathology Daily Session Note  Patient Details  Name: Dustin Golden MRN: [id number] Date of Birth: 11-Dec-1955  Today's Date: 12/25/2020 SLP Individual Time: 1430-1500 SLP Individual Time Calculation (min): 30 min  Short Term Goals: Week 2: SLP Short Term Goal 1 (Week 2): STGs=LTGs due to ELOS  Skilled Therapeutic Interventions:   Patient seen for skilled ST session in room. Patient was very pleasant, talkative and telling SLP about his work history, beliefs on health, his plan for after discharge, etc. Patient reports he has been very pleased with all of his rehab therapies and that he will be getting outpatient therapies (PT, OT, ST) upon discharge home. Initially, patient was very dysarthric and difficult to understand even at word level, however as he started talking conversationally, intelligibility improved. He exhibited dysarthria resulting in imprecise bilabial and lingual consonant phonemes and also exhibited instances of dysfluency. He reported he sometimes has to change the word he is trying to say. He was able to demonstrate effective use of learned strategies for improving speech intelligibility and return demonstrated to slow down rate, exaggerate oral motor movements (especially opening mouth fully) during speech at phrase level. Patient continues to benefit from skilled SLP intervention to maximize speech function prior to discharge.  Pain Pain Assessment Pain Scale: 0-10 Pain Score: 0-No pain Faces Pain Scale: No hurt  Therapy/Group: Individual Therapy  Sonia Baller, MA, CCC-SLP Speech Therapy

## 2020-12-26 NOTE — Discharge Summary (Signed)
Physician Discharge Summary  Patient ID: Dustin Golden MRN: [id number] DOB/AGE: Jun 28, 1956 65 y.o.  Admit date: 12/17/2020 Discharge date: 12/28/2020  Discharge Diagnoses:  Principal Problem:   Left middle cerebral artery stroke Dustin Golden) Active Problems:   Hyponatremia   AKI (acute kidney injury) Dustin Golden)   Eye discharge   Essential hypertension DVT prophylaxis History of alcohol as well as polysubstance abuse Hyperlipidemia Mechanical fall 12/15/2020  Discharged Condition: Stable  Significant Diagnostic Studies: CT Code Stroke CTA Head W/WO contrast  Result Date: 12/13/2020 CLINICAL DATA:  Acute presentation with left MCA syndrome. EXAM: CT ANGIOGRAPHY HEAD AND NECK CT PERFUSION BRAIN TECHNIQUE: Multidetector CT imaging of the head and neck was performed using the standard protocol during bolus administration of intravenous contrast. Multiplanar CT image reconstructions and MIPs were obtained to evaluate the vascular anatomy. Carotid stenosis measurements (when applicable) are obtained utilizing NASCET criteria, using the distal internal carotid diameter as the denominator. Multiphase CT imaging of the brain was performed following IV bolus contrast injection. Subsequent parametric perfusion maps were calculated using RAPID software. CONTRAST:  166m OMNIPAQUE IOHEXOL 350 MG/ML SOLN COMPARISON:  Head CT earlier tonight. FINDINGS: CTA NECK FINDINGS Aortic arch: Aortic atherosclerotic calcification. Branching pattern is normal without origin stenosis. Right carotid system: Common carotid artery widely patent to the bifurcation. Soft and calcified plaque at the carotid bifurcation and ICA bulb. Minimal diameter at distal bulb is 1 mm or less, with a pronounced web-like stenosis. Flow is present distal to that. This is consistent with an 80% or greater stenosis. Left carotid system: Common carotid artery is patent to the bifurcation region. There is occlusion of the ICA at the origin. ECA is widely patent.  No flow seen in the left internal carotid artery in till the siphon region, where there is reconstitution, probably from a combination of external internal collaterals and flow through a patent anterior and posterior communicating artery. Vertebral arteries: Atherosclerotic plaque at the right vertebral artery origin with 30-50% stenosis. The vessel is widely patent beyond that through the cervical region. Atherosclerotic plaque at the left vertebral artery origin with a 30-50% stenosis. Vessel is widely patent beyond that through the cervical region. Skeleton: Ordinary cervical spondylosis. Other neck: No mass or lymphadenopathy. Upper chest: Normal Review of the MIP images confirms the above findings CTA HEAD FINDINGS Anterior circulation: Right internal carotid artery is patent through the siphon region without stenosis. Right anterior and middle cerebral vessels appear patent and normal. As noted above, left internal carotid artery reconstitutes in the siphon region probably from a combination of external to internal collaterals and patent communicating arteries. Flow is present in the anterior and middle cerebral vessels. I cannot clearly identify an occluded distal MCA branch. Posterior circulation: Both vertebral arteries are patent through the foramen magnum to the basilar. Some atherosclerotic narrowing and irregularity of the V4 segments, right more than left. No basilar stenosis. Posterior circulation branch vessels show flow. Venous sinuses: Patent and normal. Anatomic variants: None significant. Review of the MIP images confirms the above findings CT Brain Perfusion Findings: ASPECTS: In retrospect, probably 9, with some gray-white differentiation loss in the M4 region. CBF (<30%) Volume: 152mPerfusion (Tmax>6.0s) volume: 5862mismatch Volume: 59m46mfarction Location:Left frontoparietal junction region. 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 Reconstitution in the siphon probably from a  combination of external to internal collaterals and patent communicating arteries. I cannot clearly identify the occluded MCA branch vessel, though there must be one, probably in the M3 region. 16Martin  cc completed infarction in the left frontoparietal junction region. Additional 42 cc at risk brain surrounding that region. 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 30-50 % stenoses of both vertebral artery origins. Results discussed with Dr. Curly Shores at approximately 0220 hours Electronically Signed   By: Nelson Chimes M.D.   On: 12/13/2020 02:36   DG Chest 2 View  Result Date: 12/23/2020 CLINICAL DATA:  Shortness of breath. EXAM: CHEST - 2 VIEW COMPARISON:  Single-view of the chest 12/16/2020. FINDINGS: The chest is somewhat hyperexpanded but the lungs are clear. Heart size is normal. No pneumothorax or pleural effusion. No acute or focal bony abnormality. IMPRESSION: No acute disease. Pulmonary hyperexpansion suggestive of COPD. Electronically Signed   By: Inge Rise M.D.   On: 12/23/2020 13:51   CT HEAD WO CONTRAST  Result Date: 12/15/2020 CLINICAL DATA:  Head trauma, minor, normal mental status. Additional history provided fall while getting out of bed, large right frontal scalp hematoma. EXAM: CT HEAD WITHOUT CONTRAST TECHNIQUE: Contiguous axial images were obtained from the base of the skull through the vertex without intravenous contrast. COMPARISON:  MRI/MRA head 12/13/2020. Report from thrombectomy 12/13/2020. noncontrast head CT 12/05/2020. CT angiogram head/neck 12/13/2020. FINDINGS: Brain: Cerebral volume is normal. Redemonstrated acute/early subacute cortical and subcortical left MCA territory infarct within the left frontal and parietal lobes as well as left insula. Mild curvilinear hyperdensity within the left frontoparietal operculum infarction territory, which was not definitively present on the post procedural CT of 12/13/2020. No acute  intracranial hemorrhage is identified elsewhere. The left MCA territory infarct has not appreciably increased in extent. No significant mass effect. No midline shift. Vascular: Atherosclerotic calcifications. Skull: Normal. Negative for fracture or focal lesion. Sinuses/Orbits: No significant paranasal sinus disease at the imaged levels. Other: Trace fluid within the right mastoid air cells. Prominent right anterior scalp/forehead hematoma. These results will be called to the ordering clinician or representative by the Radiologist Assistant, and communication documented in the PACS or [employer]. IMPRESSION: Redemonstrated moderate to large acute/early subacute cortical and subcortical left MCA territory infarct. Mild curvilinear hyperdensity is present within the left frontoparietal operculum infarction territory, not definitively present on the prior postprocedural head CT of 12/13/2020. This may reflect petechial hemorrhage. Alternatively, this may reflect residual thrombus within a left M2/M3 vessel. No acute intracranial hemorrhage is identified elsewhere. Prominent right anterior scalp/forehead hematoma. Electronically Signed   By: Kellie Simmering DO   On: 12/15/2020 18:42   CT Code Stroke CTA Neck W/WO contrast  Result Date: 12/13/2020 CLINICAL DATA:  Acute presentation with left MCA syndrome. EXAM: CT ANGIOGRAPHY HEAD AND NECK CT PERFUSION BRAIN TECHNIQUE: Multidetector CT imaging of the head and neck was performed using the standard protocol during bolus administration of intravenous contrast. Multiplanar CT image reconstructions and MIPs were obtained to evaluate the vascular anatomy. Carotid stenosis measurements (when applicable) are obtained utilizing NASCET criteria, using the distal internal carotid diameter as the denominator. Multiphase CT imaging of the brain was performed following IV bolus contrast injection. Subsequent parametric perfusion maps were calculated using RAPID software.  CONTRAST:  151m OMNIPAQUE IOHEXOL 350 MG/ML SOLN COMPARISON:  Head CT earlier tonight. FINDINGS: CTA NECK FINDINGS Aortic arch: Aortic atherosclerotic calcification. Branching pattern is normal without origin stenosis. Right carotid system: Common carotid artery widely patent to the bifurcation. Soft and calcified plaque at the carotid bifurcation and ICA bulb. Minimal diameter at distal bulb is 1 mm or less, with a  pronounced web-like stenosis. Flow is present distal to that. This is consistent with an 80% or greater stenosis. Left carotid system: Common carotid artery is patent to the bifurcation region. There is occlusion of the ICA at the origin. ECA is widely patent. No flow seen in the left internal carotid artery in till the siphon region, where there is reconstitution, probably from a combination of external internal collaterals and flow through a patent anterior and posterior communicating artery. Vertebral arteries: Atherosclerotic plaque at the right vertebral artery origin with 30-50% stenosis. The vessel is widely patent beyond that through the cervical region. Atherosclerotic plaque at the left vertebral artery origin with a 30-50% stenosis. Vessel is widely patent beyond that through the cervical region. Skeleton: Ordinary cervical spondylosis. Other neck: No mass or lymphadenopathy. Upper chest: Normal Review of the MIP images confirms the above findings CTA HEAD FINDINGS Anterior circulation: Right internal carotid artery is patent through the siphon region without stenosis. Right anterior and middle cerebral vessels appear patent and normal. As noted above, left internal carotid artery reconstitutes in the siphon region probably from a combination of external to internal collaterals and patent communicating arteries. Flow is present in the anterior and middle cerebral vessels. I cannot clearly identify an occluded distal MCA branch. Posterior circulation: Both vertebral arteries are patent through  the foramen magnum to the basilar. Some atherosclerotic narrowing and irregularity of the V4 segments, right more than left. No basilar stenosis. Posterior circulation branch vessels show flow. Venous sinuses: Patent and normal. Anatomic variants: None significant. Review of the MIP images confirms the above findings CT Brain Perfusion Findings: ASPECTS: In retrospect, probably 9, with some gray-white differentiation loss in the M4 region. CBF (<30%) Volume: 38m Perfusion (Tmax>6.0s) volume: 569mMismatch Volume: 4271mnfarction Location:Left frontoparietal junction region. 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 Reconstitution in the siphon probably from a combination of external to internal collaterals and patent communicating arteries. I cannot clearly identify the occluded MCA branch vessel, though there must be one, probably in the M3 region. 16 cc completed infarction in the left frontoparietal junction region. Additional 42 cc at risk brain surrounding that region. 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 30-50 % stenoses of both vertebral artery origins. Results discussed with Dr. BhaCurly Shores approximately 0220 hours Electronically Signed   By: MarNelson ChimesD.   On: 12/13/2020 02:36   MR ANGIO HEAD WO CONTRAST  Result Date: 12/13/2020 CLINICAL DATA:  Stroke. Post left internal carotid artery thrombectomy and stenting of left carotid bifurcation. EXAM: MRI HEAD WITHOUT CONTRAST MRA HEAD WITHOUT CONTRAST TECHNIQUE: Multiplanar, multiecho pulse sequences of the brain and surrounding structures were obtained without intravenous contrast. Angiographic images of the head were obtained using MRA technique without contrast. COMPARISON:  CT angio head and neck and CT perfusion 12/13/2020 FINDINGS: MRI HEAD FINDINGS Brain: Acute infarct in the left parietal lobe over the convexity. This extends anteriorly into the operculum and posterior  insula. Small areas of acute infarct in the left frontal white matter and cortex and in the right frontal white matter. No associated hemorrhage. Ventricle size normal.  No mass or midline shift. Vascular: Normal arterial flow voids. Skull and upper cervical spine: No focal lesion. Sinuses/Orbits: Paranasal sinuses clear.  Negative orbit Other: Motion degraded study. MRA HEAD FINDINGS Both vertebral arteries patent to the basilar. PICA patent bilaterally. Basilar widely patent. Superior cerebellar arteries patent bilaterally. Left posterior cerebral artery widely  patent. Patent left posterior communicating artery. Fetal origin of the right posterior cerebral artery. Decreased signal in the right P1 and P2 segment. This vessel appeared patent on CTA earlier today. Question artifact versus thrombus. Internal carotid artery patent bilaterally without stenosis. Focal moderate stenosis left M1 segment. There is good distal flow and right middle cerebral artery branches are patent. Anterior cerebral arteries patent bilaterally. Moderate stenosis right middle cerebral artery. IMPRESSION: 1. Acute infarct left posterior MCA distribution involving the posterior insula in the left frontal parietal cortex. Small areas of acute infarct in the frontal lobes bilaterally. 2. Motion degraded study. 3. MRA demonstrates loss of signal in the right posterior cerebral artery. This vessel was patent on CTA earlier today. Possible artifact versus thrombus. 4. There is moderate stenosis in the middle cerebral artery M1 segment bilaterally. Left internal carotid artery appears widely patent. Electronically Signed   By: Franchot Gallo M.D.   On: 12/13/2020 19:17   MR BRAIN WO CONTRAST  Result Date: 12/13/2020 CLINICAL DATA:  Stroke. Post left internal carotid artery thrombectomy and stenting of left carotid bifurcation. EXAM: MRI HEAD WITHOUT CONTRAST MRA HEAD WITHOUT CONTRAST TECHNIQUE: Multiplanar, multiecho pulse sequences of the  brain and surrounding structures were obtained without intravenous contrast. Angiographic images of the head were obtained using MRA technique without contrast. COMPARISON:  CT angio head and neck and CT perfusion 12/13/2020 FINDINGS: MRI HEAD FINDINGS Brain: Acute infarct in the left parietal lobe over the convexity. This extends anteriorly into the operculum and posterior insula. Small areas of acute infarct in the left frontal white matter and cortex and in the right frontal white matter. No associated hemorrhage. Ventricle size normal.  No mass or midline shift. Vascular: Normal arterial flow voids. Skull and upper cervical spine: No focal lesion. Sinuses/Orbits: Paranasal sinuses clear.  Negative orbit Other: Motion degraded study. MRA HEAD FINDINGS Both vertebral arteries patent to the basilar. PICA patent bilaterally. Basilar widely patent. Superior cerebellar arteries patent bilaterally. Left posterior cerebral artery widely patent. Patent left posterior communicating artery. Fetal origin of the right posterior cerebral artery. Decreased signal in the right P1 and P2 segment. This vessel appeared patent on CTA earlier today. Question artifact versus thrombus. Internal carotid artery patent bilaterally without stenosis. Focal moderate stenosis left M1 segment. There is good distal flow and right middle cerebral artery branches are patent. Anterior cerebral arteries patent bilaterally. Moderate stenosis right middle cerebral artery. IMPRESSION: 1. Acute infarct left posterior MCA distribution involving the posterior insula in the left frontal parietal cortex. Small areas of acute infarct in the frontal lobes bilaterally. 2. Motion degraded study. 3. MRA demonstrates loss of signal in the right posterior cerebral artery. This vessel was patent on CTA earlier today. Possible artifact versus thrombus. 4. There is moderate stenosis in the middle cerebral artery M1 segment bilaterally. Left internal carotid artery  appears widely patent. Electronically Signed   By: Franchot Gallo M.D.   On: 12/13/2020 19:17   IR INTRAVSC STENT CERV CAROTID W/EMB-PROT MOD SED  Result Date: 12/14/2020 INDICATION: 65 year old male with past medical history significant for tobacco use, alcohol and possible drug abuse. Patient presented severe aphasia and right-sided weakness. Initial NIHSS 22 later improving to 10. Head CT showed hypodensity in the left posterior frontal region (aspects 9) CT/CT angiogram of the head and neck showed a left ICA occlusion at the bulb with intracranial reconstitution in a left M3/MCA branch occlusion. Additionally patient head high-grade stenosis of the cervical right ICA and mild stenosis at  the origin of the bilateral vertebral arteries. CT perfusion showed a core infarct of 16 mL with a 58 mL penumbra. He was then transferred to our service for a diagnostic cerebral angiogram with mechanical thrombectomy. EXAM: Ultrasound-guided vascular access Diagnostic cerebral angiogram Mechanical thrombectomy Flat panel head CT Left carotid stenting with cerebral protection device COMPARISON:  CT/CT angiogram of December 13, 2020 MEDICATIONS: IV cangrelor utilized prior to stent deployment ANESTHESIA/SEDATION: The procedure was performed in the general anesthesia. CONTRAST:  95 mL of Omnipaque 240 milligram/mL FLUOROSCOPY TIME:  Fluoroscopy Time: 38 minutes 54 seconds (970 mGy). COMPLICATIONS: None immediate. TECHNIQUE: Informed written consent was not obtained given patient's severe aphasia and no reachable healthcare proxy or next of kin available. Maximal Sterile Barrier Technique was utilized including caps, mask, sterile gowns, sterile gloves, sterile drape, hand hygiene and skin antiseptic. A timeout was performed prior to the initiation of the procedure. The right groin was prepped and draped in the usual sterile fashion. Using a micropuncture kit and the modified Seldinger technique, access was gained to the right  common femoral artery and an 8 French sheath was placed. Real-time ultrasound guidance was utilized for vascular access including the acquisition of a permanent ultrasound image documenting patency of the accessed vessel. Under fluoroscopy, an 8 [place] Walrus balloon guide catheter was navigated over a 6 [place] Berenstein 2 catheter and a 0.035" Terumo Glidewire into the aortic arch. The catheter was placed into the left common carotid artery. Frontal and lateral angiograms of the neck were obtained. Under biplane roadmap, a zoom 71 aspiration catheter was navigated over a phenom 21 microcatheter and a Aristotle 14 microguidewire into the cavernous segment of the left ICA. The balloon guide catheter was advanced into the upper cervical segment of the left ICA. Frontal and lateral angiograms of the head were obtained via aspiration catheter contrast injection. FINDINGS: 1. Occlusion of the left ICA in the neck at the level of the bulb. 2. Occlusion of a proximal left M2/MCA middle division branch. 3. Nonocclusive filling defect within a left A4/ACA branch. 4. Patent right common femoral artery. PROCEDURE: Magnified frontal and lateral angiograms of the head were obtained in used as biplane roadmap. The microcatheter was then navigated over the wire into the left M3/MCA middle division branch. Then, a 4 x 40 mm solitaire stent retriever was deployed spanning the M2 and proximal M3 segment. The device was allowed to intercalated with the clot for 4 minutes. The microcatheter was removed. The aspiration catheter was advanced to the level of occlusion and connected to a penumbra aspiration pump. The guiding catheter balloon was inflated. The thrombectomy device and aspiration catheter were removed under constant aspiration. Follow-up left ICA angiogram showed complete recanalization the left MCA vascular tree. The guiding catheter was then retracted into the left common carotid artery. Frontal and lateral angiograms of  the neck were obtained. Patent left ICA noted at the bulb with significant luminal irregularity and filling defect. Left common carotid artery angiograms with frontal and lateral of the head showed for opacification of the left MCA branches which remain open. Flat panel CT of the head was obtained and post processed in a separate workstation with concurrent attending physician supervision. Selected images were sent to PACS. No evidence of hemorrhagic complication noted. Follow-up left common carotid artery angiogram showed worsening of the left ICA stenosis at the bulb with near occlusion and slow flow distally. Patient was loaded on cangrelor in anticipation to stent the placement. Under biplane roadmap, a 4-7  Emboshield NAV6 cerebral protection device was navigated into the distal cervical segment of the left ICA. Subsequently, a 9-7 x 40 mm XACT carotid stent was deployed across the area of stenosis in the left ICA. Then, in stent stenosis was performed with a 4 x 30 mm Viatrac balloon. The cerebral protection device was recaptured. Follow-up left ICA angiogram showed good position of the stent with significantly improved anterograde flow. Left ICA angiograms with frontal and lateral views of the head showed no evidence of thromboembolic complication with significant improvement of the anterograde flow. Delayed left ICA angiogram showed no evidence of in stent clot formation. Delayed cranial angiograms appear stable. The system was subsequently withdrawn. Left common femoral artery angiograms were obtained with frontal and lateral views. The puncture is at the level of the common femoral artery which shows mild atherosclerotic change with adequate caliber for closure device utilization. The femoral sheath thus exchanged for a Perclose ProGlide which was utilized for access closure. Immediate hemostasis was achieved. IMPRESSION: 1. Successful mechanical thrombectomy for treatment of a left M2/MCA occlusion with a  single pass of combined aspiration plus stent retriever achieving complete recanalization (TICI3). 2. Stenting and angioplasty of left ICA severe stenosis with cerebral protection device. PLAN: Patient is to continue on cangrelor drip until follow-up imaging. Then, transition to dual anti-platelet therapy with aspirin and Brilinta. Electronically Signed   By: [name] M.D.   On: 12/14/2020 12:36   IR CT Head Ltd  Result Date: 12/14/2020 INDICATION: 65 year old male with past medical history significant for tobacco use, alcohol and possible drug abuse. Patient presented severe aphasia and right-sided weakness. Initial NIHSS 22 later improving to 10. Head CT showed hypodensity in the left posterior frontal region (aspects 9) CT/CT angiogram of the head and neck showed a left ICA occlusion at the bulb with intracranial reconstitution in a left M3/MCA branch occlusion. Additionally patient head high-grade stenosis of the cervical right ICA and mild stenosis at the origin of the bilateral vertebral arteries. CT perfusion showed a core infarct of 16 mL with a 58 mL penumbra. He was then transferred to our service for a diagnostic cerebral angiogram with mechanical thrombectomy. EXAM: Ultrasound-guided vascular access Diagnostic cerebral angiogram Mechanical thrombectomy Flat panel head CT Left carotid stenting with cerebral protection device COMPARISON:  CT/CT angiogram of December 13, 2020 MEDICATIONS: IV cangrelor utilized prior to stent deployment ANESTHESIA/SEDATION: The procedure was performed in the general anesthesia. CONTRAST:  95 mL of Omnipaque 240 milligram/mL FLUOROSCOPY TIME:  Fluoroscopy Time: 38 minutes 54 seconds (970 mGy). COMPLICATIONS: None immediate. TECHNIQUE: Informed written consent was not obtained given patient's severe aphasia and no reachable healthcare proxy or next of kin available. Maximal Sterile Barrier Technique was utilized including caps, mask, sterile gowns,  sterile gloves, sterile drape, hand hygiene and skin antiseptic. A timeout was performed prior to the initiation of the procedure. The right groin was prepped and draped in the usual sterile fashion. Using a micropuncture kit and the modified Seldinger technique, access was gained to the right common femoral artery and an 8 French sheath was placed. Real-time ultrasound guidance was utilized for vascular access including the acquisition of a permanent ultrasound image documenting patency of the accessed vessel. Under fluoroscopy, an 8 [place] Walrus balloon guide catheter was navigated over a 6 [place] Berenstein 2 catheter and a 0.035" Terumo Glidewire into the aortic arch. The catheter was placed into the left common carotid artery. Frontal and lateral angiograms of the neck were obtained. Under  biplane roadmap, a zoom 71 aspiration catheter was navigated over a phenom 21 microcatheter and a Aristotle 14 microguidewire into the cavernous segment of the left ICA. The balloon guide catheter was advanced into the upper cervical segment of the left ICA. Frontal and lateral angiograms of the head were obtained via aspiration catheter contrast injection. FINDINGS: 1. Occlusion of the left ICA in the neck at the level of the bulb. 2. Occlusion of a proximal left M2/MCA middle division branch. 3. Nonocclusive filling defect within a left A4/ACA branch. 4. Patent right common femoral artery. PROCEDURE: Magnified frontal and lateral angiograms of the head were obtained in used as biplane roadmap. The microcatheter was then navigated over the wire into the left M3/MCA middle division branch. Then, a 4 x 40 mm solitaire stent retriever was deployed spanning the M2 and proximal M3 segment. The device was allowed to intercalated with the clot for 4 minutes. The microcatheter was removed. The aspiration catheter was advanced to the level of occlusion and connected to a penumbra aspiration pump. The guiding catheter balloon was  inflated. The thrombectomy device and aspiration catheter were removed under constant aspiration. Follow-up left ICA angiogram showed complete recanalization the left MCA vascular tree. The guiding catheter was then retracted into the left common carotid artery. Frontal and lateral angiograms of the neck were obtained. Patent left ICA noted at the bulb with significant luminal irregularity and filling defect. Left common carotid artery angiograms with frontal and lateral of the head showed for opacification of the left MCA branches which remain open. Flat panel CT of the head was obtained and post processed in a separate workstation with concurrent attending physician supervision. Selected images were sent to PACS. No evidence of hemorrhagic complication noted. Follow-up left common carotid artery angiogram showed worsening of the left ICA stenosis at the bulb with near occlusion and slow flow distally. Patient was loaded on cangrelor in anticipation to stent the placement. Under biplane roadmap, a 4-7 Emboshield NAV6 cerebral protection device was navigated into the distal cervical segment of the left ICA. Subsequently, a 9-7 x 40 mm XACT carotid stent was deployed across the area of stenosis in the left ICA. Then, in stent stenosis was performed with a 4 x 30 mm Viatrac balloon. The cerebral protection device was recaptured. Follow-up left ICA angiogram showed good position of the stent with significantly improved anterograde flow. Left ICA angiograms with frontal and lateral views of the head showed no evidence of thromboembolic complication with significant improvement of the anterograde flow. Delayed left ICA angiogram showed no evidence of in stent clot formation. Delayed cranial angiograms appear stable. The system was subsequently withdrawn. Left common femoral artery angiograms were obtained with frontal and lateral views. The puncture is at the level of the common femoral artery which shows mild  atherosclerotic change with adequate caliber for closure device utilization. The femoral sheath thus exchanged for a Perclose ProGlide which was utilized for access closure. Immediate hemostasis was achieved. IMPRESSION: 1. Successful mechanical thrombectomy for treatment of a left M2/MCA occlusion with a single pass of combined aspiration plus stent retriever achieving complete recanalization (TICI3). 2. Stenting and angioplasty of left ICA severe stenosis with cerebral protection device. PLAN: Patient is to continue on cangrelor drip until follow-up imaging. Then, transition to dual anti-platelet therapy with aspirin and Brilinta. Electronically Signed   By: [name] M.D.   On: 12/14/2020 12:36   IR US Guide Vasc Access Right  Result Date: 12/14/2020 INDICATION: 65 year old  male with past medical history significant for tobacco use, alcohol and possible drug abuse. Patient presented severe aphasia and right-sided weakness. Initial NIHSS 22 later improving to 10. Head CT showed hypodensity in the left posterior frontal region (aspects 9) CT/CT angiogram of the head and neck showed a left ICA occlusion at the bulb with intracranial reconstitution in a left M3/MCA branch occlusion. Additionally patient head high-grade stenosis of the cervical right ICA and mild stenosis at the origin of the bilateral vertebral arteries. CT perfusion showed a core infarct of 16 mL with a 58 mL penumbra. He was then transferred to our service for a diagnostic cerebral angiogram with mechanical thrombectomy. EXAM: Ultrasound-guided vascular access Diagnostic cerebral angiogram Mechanical thrombectomy Flat panel head CT Left carotid stenting with cerebral protection device COMPARISON:  CT/CT angiogram of December 13, 2020 MEDICATIONS: IV cangrelor utilized prior to stent deployment ANESTHESIA/SEDATION: The procedure was performed in the general anesthesia. CONTRAST:  95 mL of Omnipaque 240 milligram/mL FLUOROSCOPY  TIME:  Fluoroscopy Time: 38 minutes 54 seconds (970 mGy). COMPLICATIONS: None immediate. TECHNIQUE: Informed written consent was not obtained given patient's severe aphasia and no reachable healthcare proxy or next of kin available. Maximal Sterile Barrier Technique was utilized including caps, mask, sterile gowns, sterile gloves, sterile drape, hand hygiene and skin antiseptic. A timeout was performed prior to the initiation of the procedure. The right groin was prepped and draped in the usual sterile fashion. Using a micropuncture kit and the modified Seldinger technique, access was gained to the right common femoral artery and an 8 French sheath was placed. Real-time ultrasound guidance was utilized for vascular access including the acquisition of a permanent ultrasound image documenting patency of the accessed vessel. Under fluoroscopy, an 8 [place] Walrus balloon guide catheter was navigated over a 6 [place] Berenstein 2 catheter and a 0.035" Terumo Glidewire into the aortic arch. The catheter was placed into the left common carotid artery. Frontal and lateral angiograms of the neck were obtained. Under biplane roadmap, a zoom 71 aspiration catheter was navigated over a phenom 21 microcatheter and a Aristotle 14 microguidewire into the cavernous segment of the left ICA. The balloon guide catheter was advanced into the upper cervical segment of the left ICA. Frontal and lateral angiograms of the head were obtained via aspiration catheter contrast injection. FINDINGS: 1. Occlusion of the left ICA in the neck at the level of the bulb. 2. Occlusion of a proximal left M2/MCA middle division branch. 3. Nonocclusive filling defect within a left A4/ACA branch. 4. Patent right common femoral artery. PROCEDURE: Magnified frontal and lateral angiograms of the head were obtained in used as biplane roadmap. The microcatheter was then navigated over the wire into the left M3/MCA middle division branch. Then, a 4 x 40 mm  solitaire stent retriever was deployed spanning the M2 and proximal M3 segment. The device was allowed to intercalated with the clot for 4 minutes. The microcatheter was removed. The aspiration catheter was advanced to the level of occlusion and connected to a penumbra aspiration pump. The guiding catheter balloon was inflated. The thrombectomy device and aspiration catheter were removed under constant aspiration. Follow-up left ICA angiogram showed complete recanalization the left MCA vascular tree. The guiding catheter was then retracted into the left common carotid artery. Frontal and lateral angiograms of the neck were obtained. Patent left ICA noted at the bulb with significant luminal irregularity and filling defect. Left common carotid artery angiograms with frontal and lateral of the head showed for opacification of  the left MCA branches which remain open. Flat panel CT of the head was obtained and post processed in a separate workstation with concurrent attending physician supervision. Selected images were sent to PACS. No evidence of hemorrhagic complication noted. Follow-up left common carotid artery angiogram showed worsening of the left ICA stenosis at the bulb with near occlusion and slow flow distally. Patient was loaded on cangrelor in anticipation to stent the placement. Under biplane roadmap, a 4-7 Emboshield NAV6 cerebral protection device was navigated into the distal cervical segment of the left ICA. Subsequently, a 9-7 x 40 mm XACT carotid stent was deployed across the area of stenosis in the left ICA. Then, in stent stenosis was performed with a 4 x 30 mm Viatrac balloon. The cerebral protection device was recaptured. Follow-up left ICA angiogram showed good position of the stent with significantly improved anterograde flow. Left ICA angiograms with frontal and lateral views of the head showed no evidence of thromboembolic complication with significant improvement of the anterograde flow.  Delayed left ICA angiogram showed no evidence of in stent clot formation. Delayed cranial angiograms appear stable. The system was subsequently withdrawn. Left common femoral artery angiograms were obtained with frontal and lateral views. The puncture is at the level of the common femoral artery which shows mild atherosclerotic change with adequate caliber for closure device utilization. The femoral sheath thus exchanged for a Perclose ProGlide which was utilized for access closure. Immediate hemostasis was achieved. IMPRESSION: 1. Successful mechanical thrombectomy for treatment of a left M2/MCA occlusion with a single pass of combined aspiration plus stent retriever achieving complete recanalization (TICI3). 2. Stenting and angioplasty of left ICA severe stenosis with cerebral protection device. PLAN: Patient is to continue on cangrelor drip until follow-up imaging. Then, transition to dual anti-platelet therapy with aspirin and Brilinta. Electronically Signed   By: [name] M.D.   On: 12/14/2020 12:36   CT Code Stroke Cerebral Perfusion with contrast  Result Date: 12/13/2020 CLINICAL DATA:  Acute presentation with left MCA syndrome. EXAM: CT ANGIOGRAPHY HEAD AND NECK CT PERFUSION BRAIN TECHNIQUE: Multidetector CT imaging of the head and neck was performed using the standard protocol during bolus administration of intravenous contrast. Multiplanar CT image reconstructions and MIPs were obtained to evaluate the vascular anatomy. Carotid stenosis measurements (when applicable) are obtained utilizing NASCET criteria, using the distal internal carotid diameter as the denominator. Multiphase CT imaging of the brain was performed following IV bolus contrast injection. Subsequent parametric perfusion maps were calculated using RAPID software. CONTRAST:  132m OMNIPAQUE IOHEXOL 350 MG/ML SOLN COMPARISON:  Head CT earlier tonight. FINDINGS: CTA NECK FINDINGS Aortic arch: Aortic atherosclerotic  calcification. Branching pattern is normal without origin stenosis. Right carotid system: Common carotid artery widely patent to the bifurcation. Soft and calcified plaque at the carotid bifurcation and ICA bulb. Minimal diameter at distal bulb is 1 mm or less, with a pronounced web-like stenosis. Flow is present distal to that. This is consistent with an 80% or greater stenosis. Left carotid system: Common carotid artery is patent to the bifurcation region. There is occlusion of the ICA at the origin. ECA is widely patent. No flow seen in the left internal carotid artery in till the siphon region, where there is reconstitution, probably from a combination of external internal collaterals and flow through a patent anterior and posterior communicating artery. Vertebral arteries: Atherosclerotic plaque at the right vertebral artery origin with 30-50% stenosis. The vessel is widely patent beyond that through the cervical region.  Atherosclerotic plaque at the left vertebral artery origin with a 30-50% stenosis. Vessel is widely patent beyond that through the cervical region. Skeleton: Ordinary cervical spondylosis. Other neck: No mass or lymphadenopathy. Upper chest: Normal Review of the MIP images confirms the above findings CTA HEAD FINDINGS Anterior circulation: Right internal carotid artery is patent through the siphon region without stenosis. Right anterior and middle cerebral vessels appear patent and normal. As noted above, left internal carotid artery reconstitutes in the siphon region probably from a combination of external to internal collaterals and patent communicating arteries. Flow is present in the anterior and middle cerebral vessels. I cannot clearly identify an occluded distal MCA branch. Posterior circulation: Both vertebral arteries are patent through the foramen magnum to the basilar. Some atherosclerotic narrowing and irregularity of the V4 segments, right more than left. No basilar stenosis.  Posterior circulation branch vessels show flow. Venous sinuses: Patent and normal. Anatomic variants: None significant. Review of the MIP images confirms the above findings CT Brain Perfusion Findings: ASPECTS: In retrospect, probably 9, with some gray-white differentiation loss in the M4 region. CBF (<30%) Volume: 37m Perfusion (Tmax>6.0s) volume: 547mMismatch Volume: 4286mnfarction Location:Left frontoparietal junction region. 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 Reconstitution in the siphon probably from a combination of external to internal collaterals and patent communicating arteries. I cannot clearly identify the occluded MCA branch vessel, though there must be one, probably in the M3 region. 16 cc completed infarction in the left frontoparietal junction region. Additional 42 cc at risk brain surrounding that region. 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 30-50 % stenoses of both vertebral artery origins. Results discussed with Dr. [place] approximately 0220 hours Electronically Signed   By: MarNelson ChimesD.   On: 12/13/2020 02:36   DG Chest Port 1 View  Result Date: 12/16/2020 CLINICAL DATA:  Abnormal respiration. EXAM: PORTABLE CHEST 1 VIEW COMPARISON:  May 12, 2019. FINDINGS: The heart size and mediastinal contours are within normal limits. Both lungs are clear. The visualized skeletal structures are unremarkable. IMPRESSION: No active disease. Electronically Signed   By: JamMarijo ConceptionD.   On: 12/16/2020 09:16   DG Swallowing Func-Speech Pathology  Result Date: 12/14/2020 Objective Swallowing Evaluation: Type of Study: MBS-Modified Barium Swallow Study  Patient Details Name: Dustin Golden: [id number] of Birth: 09/1956-08-11day's Date: 12/14/2020 Time: SLP Start Time (ACUTE ONLY): 1315 -SLP Stop Time (ACUTE ONLY): 1335 SLP Time Calculation (min) (ACUTE ONLY): 20 min Past Medical History: No past medical history  on file. Past Surgical History: Past Surgical History: Procedure Laterality Date . APPENDECTOMY   . gastric ulcer surgery   . IR CT HEAD LTD  12/13/2020 . IR INTRAVSC STENT CERV CAROTID W/EMB-PROT MOD SED INCL ANGIO  12/14/2020 . IR PERCUTANEOUS ART THROMBECTOMY/INFUSION INTRACRANIAL INC DIAG ANGIO  12/13/2020 . IR US [place] VASC ACCESS RIGHT  12/13/2020 . RADIOLOGY WITH ANESTHESIA N/A 12/13/2020  Procedure: IR WITH ANESTHESIA;  Surgeon: DevLuanne BrasD;  [facility];  Laterality: N/A; HPI: [phone]. male with past medical history significant for ongoing tobacco abuse, alcohol use and drug use (marijuana and possibly other substances) per EMS report per bystander report. 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 Small areas of acute infarct in the frontal lobes bilaterally. Pt s/p successful mechanical thrombectomy of the proximal  left M2's/MCA middle division occlusion.  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 Right sided oral motor deficits from CVA allowed for frequent right sided anterior spillage, reduced bolus cohesion, right sided oral residuals, and premature spillage of bolus. Pharyngeally pt with reduced timing and efficiency of laryngeal vestibule closure with an instance of trace transient penetration of thins and one instance of trace silent aspiration of thins by cup on initial series. Subsequent trials of thins via cup and straw were without penetration or aspiration (including mixed consistency with barium tablet series). Mild vallecular and pyriform sinsus residuasl noted with POs, clearing with reflexive swallows. Recommend dysphagia 3 (mechanical soft) and thin liquids with meds in puree. SLP to follow up. SLP  Visit Diagnosis Dysphagia, oropharyngeal phase (R13.12) Attention and concentration deficit following -- Frontal lobe and executive function deficit following -- Impact on safety and function Mild aspiration risk;Moderate aspiration risk   CHL IP TREATMENT RECOMMENDATION 12/14/2020 Treatment Recommendations Therapy as outlined in treatment plan below   Prognosis 12/14/2020 Prognosis for Safe Diet Advancement Good Barriers to Reach Goals Time post onset Barriers/Prognosis Comment -- CHL IP DIET RECOMMENDATION 12/14/2020 SLP Diet Recommendations Dysphagia 3 (Mech soft) solids;Thin liquid Liquid Administration via Cup;Straw Medication Administration Whole meds with puree Compensations Minimize environmental distractions;Slow rate;Small sips/bites;Lingual sweep for clearance of pocketing;Clear throat intermittently Postural Changes Remain semi-upright after after feeds/meals (Comment);Seated upright at 90 degrees   CHL IP OTHER RECOMMENDATIONS 12/14/2020 Recommended Consults -- Oral Care Recommendations Oral care BID Other Recommendations --   CHL IP FOLLOW UP RECOMMENDATIONS 12/14/2020 Follow up Recommendations Inpatient Rehab   CHL IP FREQUENCY AND DURATION 12/14/2020 Speech Therapy Frequency (ACUTE ONLY) min 2x/week Treatment Duration 2 weeks      CHL IP ORAL PHASE 12/14/2020 Oral Phase Impaired Oral - Pudding Teaspoon -- Oral - Pudding Cup -- Oral - Honey Teaspoon -- Oral - Honey Cup -- Oral - Nectar Teaspoon -- Oral - Nectar Cup Right pocketing in lateral sulci;Lingual/palatal residue;Right anterior bolus loss Oral - Nectar Straw -- Oral - Thin Teaspoon -- Oral - Thin Cup Lingual/palatal residue;Right anterior bolus loss;Right pocketing in lateral sulci Oral - Thin Straw Right anterior bolus loss;Lingual/palatal residue Oral - Puree Lingual/palatal residue;Right pocketing in lateral sulci;Delayed oral transit Oral - Mech Soft Right pocketing in lateral sulci;Lingual/palatal residue;Decreased bolus cohesion;Delayed oral transit  Oral - Regular -- Oral - Multi-Consistency -- Oral - Pill Lingual/palatal residue;Right anterior bolus loss;Delayed oral transit Oral Phase - Comment --  CHL IP PHARYNGEAL PHASE 12/14/2020 Pharyngeal Phase Impaired Pharyngeal- Pudding Teaspoon -- Pharyngeal -- Pharyngeal- Pudding Cup -- Pharyngeal -- Pharyngeal- Honey Teaspoon -- Pharyngeal -- Pharyngeal- Honey Cup -- Pharyngeal -- Pharyngeal- Nectar Teaspoon -- Pharyngeal -- Pharyngeal- Nectar Cup Delayed swallow initiation-vallecula;Pharyngeal residue - pyriform;Pharyngeal residue - valleculae;Reduced laryngeal elevation;Reduced tongue base retraction Pharyngeal -- Pharyngeal- Nectar Straw -- Pharyngeal -- Pharyngeal- Thin Teaspoon -- Pharyngeal -- Pharyngeal- Thin Cup Reduced laryngeal elevation;Reduced tongue base retraction;Pharyngeal residue - valleculae;Pharyngeal residue - pyriform;Penetration/Aspiration before swallow;Penetration/Aspiration during swallow;Reduced epiglottic inversion;Reduced airway/laryngeal closure;Delayed swallow initiation-pyriform sinuses Pharyngeal Material enters airway, passes BELOW cords without attempt by patient to eject out (silent aspiration);Material enters airway, remains ABOVE vocal cords and not ejected out;Material does not enter airway Pharyngeal- Thin Straw Pharyngeal residue - valleculae;Pharyngeal residue - pyriform;Delayed swallow initiation-pyriform sinuses;Reduced tongue base retraction;Reduced laryngeal elevation Pharyngeal -- Pharyngeal- Puree Reduced tongue base retraction;Reduced laryngeal elevation;Delayed swallow initiation-vallecula;Pharyngeal residue -  valleculae;Pharyngeal residue - pyriform Pharyngeal -- Pharyngeal- Mechanical Soft Delayed swallow initiation-vallecula;Reduced laryngeal elevation;Reduced tongue base retraction;Pharyngeal residue - valleculae;Pharyngeal residue - pyriform Pharyngeal -- Pharyngeal- Regular -- Pharyngeal -- Pharyngeal- Multi-consistency -- Pharyngeal -- Pharyngeal- Pill  Pharyngeal residue - pyriform;Pharyngeal residue - valleculae;Reduced tongue base retraction;Reduced laryngeal elevation Pharyngeal -- Pharyngeal Comment --  CHL IP CERVICAL ESOPHAGEAL PHASE 12/14/2020 Cervical Esophageal Phase WFL Pudding Teaspoon -- Pudding Cup -- Honey Teaspoon -- Honey Cup -- Nectar Teaspoon -- Nectar Cup -- Nectar Straw -- Thin Teaspoon -- Thin Cup -- Thin Straw -- Puree -- Mechanical Soft -- Regular -- Multi-consistency -- Pill -- Cervical Esophageal Comment -- Hayden Rasmussen MA, CCC-SLP Acute Rehabilitation Services 12/14/2020, 2:09 PM              ECHOCARDIOGRAM COMPLETE  Result Date: 12/13/2020    ECHOCARDIOGRAM REPORT   Patient Name:   JEAN SKOW Date of Exam: 12/13/2020 Medical Rec #:  [id number]    Height:       76.0 in Accession #:    [id number]   Weight:       194.7 lb Date of Birth:  1956/04/12    BSA:          2.190 m Patient Age:    78 years     BP:           113/65 mmHg Patient Gender: M            HR:           68 bpm. Exam Location:  Inpatient Procedure: 2D Echo, Cardiac Doppler, Color Doppler and Intracardiac            Opacification Agent Indications:    Stroke  History:        Patient has no prior history of Echocardiogram examinations.                 Stroke; Risk Factors:Current Smoker. ETOH.  Sonographer:    Roseanna Rainbow RDCS Referring Phys: [id number] [place]  Sonographer Comments: Image acquisition challenging due to respiratory motion. Images extremely respiratory. IMPRESSIONS  1. Left ventricular ejection fraction, by estimation, is 35 to 40%. The left ventricle has moderately decreased function. The left ventricle demonstrates global hypokinesis. There is moderate concentric left ventricular hypertrophy. Left ventricular diastolic parameters are consistent with Grade I diastolic dysfunction (impaired relaxation).  2. Right ventricular systolic function is mildly reduced. The right ventricular size is mildly enlarged. There is normal pulmonary artery systolic pressure.  The estimated right ventricular systolic pressure is 16.6 mmHg.  3. The mitral valve is normal in structure. Trivial mitral valve regurgitation. No evidence of mitral stenosis. Moderate mitral annular calcification.  4. The aortic valve is normal in structure. Aortic valve regurgitation is not visualized. No aortic stenosis is present.  5. The inferior vena cava is normal in size with greater than 50% respiratory variability, suggesting right atrial pressure of 3 mmHg. FINDINGS  Left Ventricle: Left ventricular ejection fraction, by estimation, is 35 to 40%. The left ventricle has moderately decreased function. The left ventricle demonstrates global hypokinesis. Definity contrast agent was given IV to delineate the left ventricular endocardial borders. The left ventricular internal cavity size was normal in size. There is moderate concentric left ventricular hypertrophy. Left ventricular diastolic parameters are consistent with Grade I diastolic dysfunction (impaired relaxation). Normal left ventricular filling pressure. Right Ventricle: The right ventricular size is mildly enlarged. No increase in right ventricular wall thickness. Right ventricular systolic function is mildly  reduced. There is normal pulmonary artery systolic pressure. The tricuspid regurgitant velocity  is 2.00 m/s, and with an assumed right atrial pressure of 3 mmHg, the estimated right ventricular systolic pressure is 57.8 mmHg. Left Atrium: Left atrial size was normal in size. Right Atrium: Right atrial size was normal in size. Pericardium: There is no evidence of pericardial effusion. Mitral Valve: The mitral valve is normal in structure. There is mild thickening of the mitral valve leaflet(s). Moderate mitral annular calcification. Trivial mitral valve regurgitation. No evidence of mitral valve stenosis. Tricuspid Valve: The tricuspid valve is normal in structure. Tricuspid valve regurgitation is mild . No evidence of tricuspid stenosis.  Aortic Valve: The aortic valve is normal in structure. Aortic valve regurgitation is not visualized. No aortic stenosis is present. Pulmonic Valve: The pulmonic valve was normal in structure. Pulmonic valve regurgitation is not visualized. No evidence of pulmonic stenosis. Aorta: The aortic root is normal in size and structure. Venous: The inferior vena cava is normal in size with greater than 50% respiratory variability, suggesting right atrial pressure of 3 mmHg. IAS/Shunts: No atrial level shunt detected by color flow Doppler.  LEFT VENTRICLE PLAX 2D LVIDd:         5.30 cm      Diastology LVIDs:         4.50 cm      LV e' medial:    5.22 cm/s LV PW:         1.60 cm      LV E/e' medial:  11.4 LV IVS:        1.60 cm      LV e' lateral:   5.92 cm/s LVOT diam:     1.80 cm      LV E/e' lateral: 10.1 LV SV:         42 LV SV Index:   19 LVOT Area:     2.54 cm  LV Volumes (MOD) LV vol d, MOD A2C: 115.0 ml LV vol d, MOD A4C: 142.0 ml LV vol s, MOD A2C: 66.8 ml LV vol s, MOD A4C: 105.0 ml LV SV MOD A2C:     48.2 ml LV SV MOD A4C:     142.0 ml LV SV MOD BP:      44.8 ml RIGHT VENTRICLE            IVC RV S prime:     7.79 cm/s  IVC diam: 2.40 cm TAPSE (M-mode): 1.6 cm LEFT ATRIUM             Index       RIGHT ATRIUM           Index LA diam:        3.10 cm 1.42 cm/m  RA Area:     17.30 cm LA Vol (A2C):   39.6 ml 18.08 ml/m RA Volume:   50.40 ml  23.01 ml/m LA Vol (A4C):   44.2 ml 20.18 ml/m LA Biplane Vol: 44.3 ml 20.23 ml/m  AORTIC VALVE LVOT Vmax:   90.20 cm/s LVOT Vmean:  62.700 cm/s LVOT VTI:    0.167 m  AORTA Ao Root diam: 3.40 cm MITRAL VALVE               TRICUSPID VALVE MV Area (PHT): 3.21 cm    TR Peak grad:   16.0 mmHg MV Decel Time: 236 msec    TR Vmax:        200.00 cm/s MV E velocity: 59.70 cm/s MV  A velocity: 82.00 cm/s  SHUNTS MV E/A ratio:  0.73        Systemic VTI:  0.17 m                            Systemic Diam: 1.80 cm Ena Dawley MD Electronically signed by Ena Dawley MD Signature  Date/Time: 12/13/2020/1:53:15 PM    Final    IR PERCUTANEOUS ART THROMBECTOMY/INFUSION INTRACRANIAL INC DIAG ANGIO  Result Date: 12/14/2020 INDICATION: 65 year old male with past medical history significant for tobacco use, alcohol and possible drug abuse. Patient presented severe aphasia and right-sided weakness. Initial NIHSS 22 later improving to 10. Head CT showed hypodensity in the left posterior frontal region (aspects 9) CT/CT angiogram of the head and neck showed a left ICA occlusion at the bulb with intracranial reconstitution in a left M3/MCA branch occlusion. Additionally patient head high-grade stenosis of the cervical right ICA and mild stenosis at the origin of the bilateral vertebral arteries. CT perfusion showed a core infarct of 16 mL with a 58 mL penumbra. He was then transferred to our service for a diagnostic cerebral angiogram with mechanical thrombectomy. EXAM: Ultrasound-guided vascular access Diagnostic cerebral angiogram Mechanical thrombectomy Flat panel head CT Left carotid stenting with cerebral protection device COMPARISON:  CT/CT angiogram of December 13, 2020 MEDICATIONS: IV cangrelor utilized prior to stent deployment ANESTHESIA/SEDATION: The procedure was performed in the general anesthesia. CONTRAST:  95 mL of Omnipaque 240 milligram/mL FLUOROSCOPY TIME:  Fluoroscopy Time: 38 minutes 54 seconds (970 mGy). COMPLICATIONS: None immediate. TECHNIQUE: Informed written consent was not obtained given patient's severe aphasia and no reachable healthcare proxy or next of kin available. Maximal Sterile Barrier Technique was utilized including caps, mask, sterile gowns, sterile gloves, sterile drape, hand hygiene and skin antiseptic. A timeout was performed prior to the initiation of the procedure. The right groin was prepped and draped in the usual sterile fashion. Using a micropuncture kit and the modified Seldinger technique, access was gained to the right common femoral artery and an 8  French sheath was placed. Real-time ultrasound guidance was utilized for vascular access including the acquisition of a permanent ultrasound image documenting patency of the accessed vessel. Under fluoroscopy, an 8 [place] Walrus balloon guide catheter was navigated over a 6 [place] Berenstein 2 catheter and a 0.035" Terumo Glidewire into the aortic arch. The catheter was placed into the left common carotid artery. Frontal and lateral angiograms of the neck were obtained. Under biplane roadmap, a zoom 71 aspiration catheter was navigated over a phenom 21 microcatheter and a Aristotle 14 microguidewire into the cavernous segment of the left ICA. The balloon guide catheter was advanced into the upper cervical segment of the left ICA. Frontal and lateral angiograms of the head were obtained via aspiration catheter contrast injection. FINDINGS: 1. Occlusion of the left ICA in the neck at the level of the bulb. 2. Occlusion of a proximal left M2/MCA middle division branch. 3. Nonocclusive filling defect within a left A4/ACA branch. 4. Patent right common femoral artery. PROCEDURE: Magnified frontal and lateral angiograms of the head were obtained in used as biplane roadmap. The microcatheter was then navigated over the wire into the left M3/MCA middle division branch. Then, a 4 x 40 mm solitaire stent retriever was deployed spanning the M2 and proximal M3 segment. The device was allowed to intercalated with the clot for 4 minutes. The microcatheter was removed. The aspiration catheter was advanced to the level of  occlusion and connected to a penumbra aspiration pump. The guiding catheter balloon was inflated. The thrombectomy device and aspiration catheter were removed under constant aspiration. Follow-up left ICA angiogram showed complete recanalization the left MCA vascular tree. The guiding catheter was then retracted into the left common carotid artery. Frontal and lateral angiograms of the neck were obtained. Patent  left ICA noted at the bulb with significant luminal irregularity and filling defect. Left common carotid artery angiograms with frontal and lateral of the head showed for opacification of the left MCA branches which remain open. Flat panel CT of the head was obtained and post processed in a separate workstation with concurrent attending physician supervision. Selected images were sent to PACS. No evidence of hemorrhagic complication noted. Follow-up left common carotid artery angiogram showed worsening of the left ICA stenosis at the bulb with near occlusion and slow flow distally. Patient was loaded on cangrelor in anticipation to stent the placement. Under biplane roadmap, a 4-7 Emboshield NAV6 cerebral protection device was navigated into the distal cervical segment of the left ICA. Subsequently, a 9-7 x 40 mm XACT carotid stent was deployed across the area of stenosis in the left ICA. Then, in stent stenosis was performed with a 4 x 30 mm Viatrac balloon. The cerebral protection device was recaptured. Follow-up left ICA angiogram showed good position of the stent with significantly improved anterograde flow. Left ICA angiograms with frontal and lateral views of the head showed no evidence of thromboembolic complication with significant improvement of the anterograde flow. Delayed left ICA angiogram showed no evidence of in stent clot formation. Delayed cranial angiograms appear stable. The system was subsequently withdrawn. Left common femoral artery angiograms were obtained with frontal and lateral views. The puncture is at the level of the common femoral artery which shows mild atherosclerotic change with adequate caliber for closure device utilization. The femoral sheath thus exchanged for a Perclose ProGlide which was utilized for access closure. Immediate hemostasis was achieved. IMPRESSION: 1. Successful mechanical thrombectomy for treatment of a left M2/MCA occlusion with a single pass of combined  aspiration plus stent retriever achieving complete recanalization (TICI3). 2. Stenting and angioplasty of left ICA severe stenosis with cerebral protection device. PLAN: Patient is to continue on cangrelor drip until follow-up imaging. Then, transition to dual anti-platelet therapy with aspirin and Brilinta. Electronically Signed   By: [name] M.D.   On: 12/14/2020 12:36   CT HEAD CODE STROKE WO CONTRAST  Result Date: 12/13/2020 CLINICAL DATA:  Code stroke. Right sided weakness, facial droop and slurred speech. EXAM: CT HEAD WITHOUT CONTRAST TECHNIQUE: Contiguous axial images were obtained from the base of the skull through the vertex without intravenous contrast. COMPARISON:  None. FINDINGS: Brain: No abnormality affects the brainstem or cerebellum. Cerebral hemispheres are normal for age. No advanced atrophy. No sign of old or acute infarction, mass lesion, hemorrhage, hydrocephalus or extra-axial collection. Vascular: No abnormal vascular finding. Skull: Normal Sinuses/Orbits: Clear/normal Other: None ASPECTS (Roy Stroke Program Early CT Score) - Ganglionic level infarction (caudate, lentiform nuclei, internal capsule, insula, M1-M3 cortex): 7 - Supraganglionic infarction (M4-M6 cortex): 3 Total score (0-10 with 10 being normal): 10 IMPRESSION: 1. Normal head CT. 2. Aspects 10. 3. These results were communicated to Dr. Curly Shores at 2:00 amon 2/28/2022by text page via the [facility] messaging system. Electronically Signed   By: Nelson Chimes M.D.   On: 12/13/2020 02:02    Labs:  Basic Metabolic Panel: Recent Labs  Lab 12/21/20 0545 12/27/20 [id number]  NA 134* 135  K 4.1 4.1  CL 101 101  CO2 23 25  GLUCOSE 103* 112*  BUN 13 10  CREATININE 1.11 1.09  CALCIUM 9.0 9.0    CBC: No results for input(s): WBC, NEUTROABS, HGB, HCT, MCV, PLT in the last 168 hours.  CBG: No results for input(s): GLUCAP in the last 168 hours.  Family history.  Mother and father with hypertension as well  as hyperlipidemia.  Denies any colon cancer esophageal cancer or rectal cancer  Brief HPI:   Dustin Golden is a 65 y.o. right-handed male with history of tobacco and alcohol use as well as marijuana on no prescription medications.  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  Presented 12/13/2020 with acute onset of right-sided weakness and aphasia.  Admission chemistries unremarkable except glucose 117 creatinine 1.28 alcohol negative urine drug screen positive cocaine as well as marijuana.  Cranial CT scan negative.  Patient did not receive TPA.  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  Severe weblike stenosis of the right internal carotid artery at the distal bulb 80% or greater.  30 to 50% stenosis of both vertebral artery origins.  Patient underwent emergent mechanical thrombectomy per interventional radiology.  MRI/MRA showed acute infarction left posterior MCA distribution involving the posterior insula and the left frontal parietal cortex.  Small area of acute infarction in the frontal lobes bilaterally.  MRA demonstrated loss of signal in the right posterior cerebral artery.  Echocardiogram with ejection fraction of 35 to 21% grade 1 diastolic dysfunction.  Neurology follow-up maintained on aspirin as well as Brilinta for CVA prophylaxis.  Maintain on mechanical soft thin liquid diet.  Patient did have a reported fall 12/15/2020 sustaining small abrasion on the right knee and large right facial scalp hematoma patient remained awake alert interactive unchanged from baseline with follow-up per neurology services and follow-up cranial CT scan redemonstrated moderately to large acute early subacute cortical subcortical left MCA with no acute changes.  Therapy evaluations completed due to patient's right side weakness and aphasia was admitted for a comprehensive rehab program   [facility] Course: JERMAYNE SWEENEY was admitted to rehab 12/17/2020 for  inpatient therapies to consist of PT, ST and OT at least three hours five days a week. Past admission physiatrist, therapy team and rehab RN have worked together to provide customized collaborative inpatient rehab.  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  Remained on aspirin and Brilinta per neurology services.  No bleeding episodes.  Blood pressure controlled on low-dose amlodipine as well as lisinopril and will need outpatient follow-up.  Zocor ongoing for hyperlipidemia.  History of alcohol tobacco as well as polysubstance use patient received count regards to cessation of these products it was questionable if he would be compliant with these request.  Noted mechanical fall 12/15/2020 sustained abrasion right knee and large right facial scalp hematoma follow-up cranial CT scan unchanged.  Ongoing advisement made to patient for ongoing safety.   Blood pressures were monitored on TID basis and controlled     Rehab course: During patient's stay in rehab weekly team conferences were held to monitor patient's progress, set goals and discuss barriers to discharge. At admission, patient required minimal assist sit to stand minimal assist 1 foot to person hand-held assist minimal assist sit to supine.  Moderate assist upper body bathing mod assist lower  body bathing mod assist upper body dressing max is lower body dressing  Physical exam.  Blood pressure 162/83 pulse 72 temperature 98.5 respirations 18 oxygen saturations 100% room air Constitutional.  No acute distress HEENT.  Right scalp hematoma dressing in place Eyes.  Pupils round and reactive to light no discharge.nystagmus Neck.  Supple nontender no JVD without thyromegaly Cardiac regular rate rhythm not extra sounds or murmur heard Abdomen.  Soft nontender positive bowel sounds without rebound Respiratory effort normal no respiratory distress without  wheeze Neurologic.  Alert oriented makes eye contact with examiner he did have some mild expressive difficulties and dysarthria.  Follows full commands.  5/5 strength except 4/5 in right hand grip and hip flexors  He/She  has had improvement in activity tolerance, balance, postural control as well as ability to compensate for deficits. He/She has had improvement in functional use RUE/LUE  and RLE/LLE as well as improvement in awareness.  Patient ambulating supervision level throughout the unit.  Focusing on coordination strengthening endurance timing control and activity tolerance.  Perform session with seated position for energy conservation for ADLs gather his belongings for activities day living.  Patient showers and sit to stand level.  He was able to communicate his needs.  Speech therapy follow-up facilitated sessions by providing overall moderate verbal cues and articulatory cues to self monitor and correct motor speech.  Full family teaching completed plan discharged to home       Disposition: Discharged home    Diet: Regular  Special Instructions: No driving smoking alcohol or illicit drug use  Medications at discharge 1.  Tylenol as needed 2.  Norvasc 10 mg p.o. daily 3.  Aspirin 81 mg p.o. daily 4.  Zocor 40 mg daily 5.  Folic acid 1 mg p.o. daily 6.  Lisinopril 2.5 mg p.o. daily 7.  Multivitamin daily 8.  NicoDerm patch taper as directed 9.  Brilinta 90 mg p.o. twice daily  30-35 minutes were spent completing discharge summary and discharge planning  Discharge Instructions    Ambulatory referral to Neurology   Complete by: As directed    An appointment is requested in approximately 4 weeks left MCA infarction   Ambulatory referral to Physical Medicine Rehab   Complete by: As directed    Moderate complexity follow-up 1 to 2 weeks left MCA infarction       Follow-up Information    Meredith Staggers, MD Follow up.   Specialty: Physical Medicine and  Rehabilitation Why: Office to call for appointment Contact information: [address] [place] [address] [phone]        de Rosario Jacks, MD Follow up.   Specialties: Radiology, Interventional Radiology Why: Call for appointment Contact information: [place] [address] [phone]               Signed: Lavon Paganini [place] 12/28/2020, 5:13 AM

## 2020-12-27 LAB — BASIC METABOLIC PANEL
Anion gap: 9 (ref 5–15)
BUN: 10 mg/dL (ref 8–23)
CO2: 25 mmol/L (ref 22–32)
Calcium: 9 mg/dL (ref 8.9–10.3)
Chloride: 101 mmol/L (ref 98–111)
Creatinine, Ser: 1.09 mg/dL (ref 0.61–1.24)
GFR, Estimated: >60 mL/min (ref >60)
Glucose, Bld: 112 mg/dL — ABNORMAL HIGH (ref 70–99)
Potassium: 4.1 mmol/L (ref 3.5–5.1)
Sodium: 135 mmol/L (ref 135–145)

## 2020-12-27 MED ORDER — LISINOPRIL 2.5 MG PO TABS: 2.5000 mg | ORAL_TABLET | Freq: Every day | ORAL | 0 refills | Status: DC

## 2020-12-27 MED ORDER — NICOTINE 7 MG/24HR TD PT24: MEDICATED_PATCH | TRANSDERMAL | 0 refills | Status: DC

## 2020-12-27 MED ORDER — AMLODIPINE BESYLATE 10 MG PO TABS: 10.0000 mg | ORAL_TABLET | Freq: Every day | ORAL | 0 refills | Status: DC

## 2020-12-27 MED ORDER — TICAGRELOR 90 MG PO TABS: 90.0000 mg | ORAL_TABLET | Freq: Two times a day (BID) | ORAL | 0 refills | Status: DC

## 2020-12-27 MED ORDER — SIMVASTATIN 40 MG PO TABS: 40.0000 mg | ORAL_TABLET | Freq: Every day | ORAL | 0 refills | Status: DC

## 2020-12-27 MED ORDER — ATORVASTATIN CALCIUM 20 MG PO TABS: 20.0000 mg | ORAL_TABLET | Freq: Every day | ORAL | 0 refills | Status: DC

## 2020-12-27 MED ORDER — FOLIC ACID 1 MG PO TABS: 1.0000 mg | ORAL_TABLET | Freq: Every day | ORAL | 0 refills | Status: DC

## 2020-12-27 NOTE — Progress Notes (Signed)
Occupational Therapy Discharge Summary  Patient Details  Name: Dustin Golden MRN: [id number] Date of Birth: Nov 15, 1955  Today's Date: 12/27/2020 OT Individual Time: 1430-1500 OT Individual Time Calculation (min): 30 min    Patient has met 13 of 13 long term goals due to improved activity tolerance, improved balance, postural control, ability to compensate for deficits, functional use of  RIGHT upper and RIGHT lower extremity, improved attention, improved awareness and improved coordination.  Patient to discharge at overall Supervision level.  Patient's care partner is independent to provide the necessary physical and cognitive assistance at discharge.    Reasons goals not met: All treatment goals met.   Recommendation:  Patient will benefit from ongoing skilled OT services in outpatient setting to continue to advance functional skills in the area of BADL and iADL.  Equipment: shower chair  Reasons for discharge: treatment goals met and discharge from hospital  Patient/family agrees with progress made and goals achieved: Yes   Skilled OT intervention:  Pt received from PT in gym. Girlfriend [name] present for session. Discussed home set up and tub transfer. Pt was able to demonstrate both transfer down into tub and in/out using shower chair. CGA for full tub transfer and supervision for transfer with chair. [name] asking appropriate questions and very involved in pt care. Pt returned to room and completed shower at supervision level overall. Education provided throughout re safety, hemi techniques, R inattention, and general fall risk reduction at home. Pt dressed with supervision, including socks and shoes. Discussed HEP and continued forced use of RUE for recovery. Pt was left sitting in recliner with all needs met.   OT Discharge Precautions/Restrictions  Precautions Precautions: Fall Precaution Comments: R inattention Restrictions Weight Bearing Restrictions: No Pain Pain  Assessment Pain Scale: 0-10 Pain Score: 0-No pain ADL ADL Eating: Supervision/safety,Set up Where Assessed-Eating: Chair Grooming: Supervision/safety Where Assessed-Grooming: Standing at sink Upper Body Bathing: Supervision/safety Where Assessed-Upper Body Bathing: Shower Lower Body Bathing: Supervision/safety Where Assessed-Lower Body Bathing: Shower Upper Body Dressing: Supervision/safety Where Assessed-Upper Body Dressing: Sitting at sink Lower Body Dressing: Supervision/safety Where Assessed-Lower Body Dressing: Chair Toileting: Supervision/safety Where Assessed-Toileting: Glass blower/designer: Close supervision Toilet Transfer Method: Human resources officer: Close supervison Clinical cytogeneticist Method: Optometrist: Facilities manager: Environmental education officer Method: Ambulating Vision Baseline Vision/History: Wears glasses Wears Glasses: Distance only Patient Visual Report: Blurring of vision Vision Assessment?: Vision impaired- to be further tested in functional context Eye Alignment: Within Functional Limits Alignment/Gaze Preference: Within Defined Limits Tracking/Visual Pursuits: Able to track stimulus in all quads without difficulty Saccades: Within functional limits Perception  Perception: Impaired Inattention/Neglect: Does not attend to right side of body;Does not attend to right visual field (much improved) Praxis Praxis: Intact Cognition Overall Cognitive Status: Within Functional Limits for tasks assessed Arousal/Alertness: Awake/alert Orientation Level: Oriented X4 Attention: Selective Selective Attention: Appears intact Memory: Appears intact Awareness: Appears intact Problem Solving: Appears intact Sequencing: Appears intact Organizing: Appears intact Safety/Judgment: Appears intact Sensation Sensation Light Touch: Impaired Detail Central sensation comments: Inconsistent R  UE sensation with testing, pt reports it is 80% back Light Touch Impaired Details: Impaired RUE;Impaired RLE Proprioception: Impaired by gross assessment Coordination Gross Motor Movements are Fluid and Coordinated: No Fine Motor Movements are Fluid and Coordinated: No Coordination and Movement Description: Poor proprioception and awareness of R side Motor  Motor Motor: Hemiplegia;Motor apraxia;Abnormal tone Motor - Discharge Observations: R hemi with decreased coordination and proprioception R UE>LE Mobility  Bed Mobility Bed  Mobility: Rolling Right;Rolling Left;Supine to Sit;Sit to Supine Rolling Right: Independent Rolling Left: Independent Supine to Sit: Independent Sit to Supine: Independent Transfers Sit to Stand: Independent Stand to Sit: Independent  Trunk/Postural Assessment  Cervical Assessment Cervical Assessment: Within Functional Limits Thoracic Assessment Thoracic Assessment: Within Functional Limits Lumbar Assessment Lumbar Assessment: Within Functional Limits Postural Control Postural Control: Deficits on evaluation (decreased/delayed)  Balance Balance Balance Assessed: Yes Static Sitting Balance Static Sitting - Balance Support: Feet supported Static Sitting - Level of Assistance: 7: Independent Dynamic Sitting Balance Dynamic Sitting - Balance Support: Feet supported Dynamic Sitting - Level of Assistance: 7: Independent Static Standing Balance Static Standing - Balance Support: During functional activity Static Standing - Level of Assistance: 6: Modified independent (Device/Increase time) Dynamic Standing Balance Dynamic Standing - Balance Support: During functional activity Dynamic Standing - Level of Assistance: 5: Stand by assistance Extremity/Trunk Assessment RUE Assessment RUE Assessment: Exceptions to [facility] Passive Range of Motion (PROM) Comments: wfl General Strength Comments: WFL- limited by proprioception, sensation deficits, and R  awareness LUE Assessment LUE Assessment: Within Functional Limits   Curtis Sites 12/27/2020, 8:22 AM

## 2020-12-27 NOTE — Progress Notes (Signed)
Physical Therapy Discharge Summary  Patient Details  Name: Dustin Golden MRN: [id number] Date of Birth: 1956-01-12  Today's Date: 12/27/2020 PT Individual Time: 1345-1430 PT Individual Time Calculation (min): 45 min    Patient has met 9 of 9 long term goals due to improved activity tolerance, improved balance, improved postural control, increased strength, ability to compensate for deficits, improved attention, improved awareness and improved coordination.  Patient to discharge at an ambulatory level Supervision.   Patient's care partner is independent to provide the necessary physical assistance at discharge.  Reasons goals not met: n/a   Recommendation:  Patient will benefit from ongoing skilled PT services in outpatient setting to continue to advance safe functional mobility, address ongoing impairments in balance, coordination, R attention, activity tolerance, community integration, dynamic gait, patient/caregiver education, and minimize fall risk.  Equipment: No equipment provided  Reasons for discharge: treatment goals met  Patient/family agrees with progress made and goals achieved: Yes  Skilled Therapeutic Interventions: Patient in recliner with his SO, Dustin Golden, present for family educaiton upon PT arrival. Patient alert and agreeable to PT session. Patient denied pain during session.   Therapeutic Activity: Transfers: Patient performed sit to/from stand independently throughout session form various surface heights. Patient performed a simulated sedan height car transfer with supervision without AD. Provided cues for safe technique.  PT demonstrated a floor transfer from sitting<>lying. Educated on signs and symptoms to assess during self-assessment in the event of a fall and symptoms that indicate immediate medical attention. Instructed patient to have his phone in his pocket at all times to have access to call emergency services and not to get up if any sign of critical injury,  symptoms, or LOC. Then instructed patient if no signs or symptoms present, he should call for assistance from another person, crawl to the nearest stable place to sit and perform a transfer from the floor to the seat, not to standing for safety to reduce risk of a second fall. Patient then performed a floor transfer from arm chair to/from floor mat with close supervision demonstrating good technique following PT instruction.   Gait Training:  Patient ambulated >300 feet x2 and >100 feet x1 without AD with supervision for safety. Ambulated as described below. Demonstrated and recommended Dustin Golden guard patient on the R for safety due to decreased body and visual attention/awareness on R. Demonstrated understanding. Patient ascended/descended 1-8" step without upper extremity support to simulate home entry and 12-6" steps using Dustin rail to simulate going to/form the basement with close supervision for safety. Performed step-to gait pattern leading with Dustin while ascending and R while descending. Provided cues for technique and sequencing.  Patient ambulated up/down a ramp, over 10 feet of mulch (unlevel surface), and up/down a curb to simulate community ambulation over unlevel surfaces with close supervision without an AD. Provided cues for technique and use of AD.  Dustin Golden demonstrated safe guarding technique following PT demonstration with all mobility performed during session. Patient and family's questions/concerns addressed throughout session. Educated patient and Dustin Golden on fall risk/prevention and home modifications to prevent falls. Educated on recommendations for 24/7 supervision for safety upon d/c due to patient being at increased fall risk and poor awareness/attention to R side with mobility at d/c. Patient and family stated understanding and in agreement.   Patient seated on mat table with Dustin Golden present when handed off to [place], [place] at end of session.  PT  Discharge Precautions/Restrictions Precautions Precautions: Fall Restrictions Weight Bearing Restrictions: No Vision/Perception  Vision - Assessment  Eye Alignment: Within Functional Limits Ocular Range of Motion: Within Functional Limits Alignment/Gaze Preference: Within Defined Limits Tracking/Visual Pursuits: Able to track stimulus in all quads without difficulty Saccades: Within functional limits Perception Perception: Impaired Inattention/Neglect: Does not attend to right visual field;Does not attend to right side of body Comments: much improved during admission Praxis Praxis: Intact  Cognition Overall Cognitive Status: Within Functional Limits for tasks assessed Arousal/Alertness: Awake/alert Attention: Selective Selective Attention: Appears intact Memory: Appears intact Awareness: Appears intact Problem Solving: Appears intact Safety/Judgment: Appears intact Sensation Sensation Light Touch: Impaired Detail Central sensation comments: Inconsistent R UE/LE sensation with testing, pt reports it is 80% back in both Light Touch Impaired Details: Impaired RUE;Impaired RLE Proprioception: Impaired Detail Proprioception Impaired Details: Impaired RLE;Absent RUE Coordination Gross Motor Movements are Fluid and Coordinated: No Fine Motor Movements are Fluid and Coordinated: No Coordination and Movement Description: Poor proprioception and awareness of R side Motor  Motor Motor: Hemiplegia;Motor apraxia;Abnormal tone Motor - Discharge Observations: R hemi with decreased coordination and proprioception R UE>LE  Mobility Bed Mobility Rolling Right: Independent Rolling Left: Independent Supine to Sit: Independent Sit to Supine: Independent Transfers Sit to Stand: Independent Stand to Sit: Independent Stand Pivot Transfers: Independent Transfer (Assistive device): None Locomotion  Gait Ambulation: Yes Gait Assistance: Supervision/Verbal cueing Gait Distance (Feet):  1463 Feet Assistive device: None Gait Assistance Details: Attention to R side of body and visual field for safety Gait Gait: Yes Gait Pattern: Step-through pattern;Narrow base of support;Decreased trunk rotation Gait velocity: 1.24 m/s Stairs / Additional Locomotion Stairs: Yes Stairs Assistance: Supervision/Verbal cueing Stair Management Technique: One rail Left Number of Stairs: 12 Height of Stairs: 6 Ramp: Supervision/Verbal cueing Curb: Supervision/Verbal cueing Wheelchair Mobility Wheelchair Mobility: No  Trunk/Postural Assessment  Cervical Assessment Cervical Assessment: Within Functional Limits Thoracic Assessment Thoracic Assessment: Within Functional Limits Lumbar Assessment Lumbar Assessment: Within Functional Limits Postural Control Postural Control: Deficits on evaluation (decreased/delayed R>Dustin)  Balance Balance Balance Assessed: Yes Static Sitting Balance Static Sitting - Balance Support: Feet supported Static Sitting - Level of Assistance: 7: Independent Dynamic Sitting Balance Dynamic Sitting - Balance Support: Feet supported Dynamic Sitting - Level of Assistance: 7: Independent Static Standing Balance Static Standing - Balance Support: During functional activity Static Standing - Level of Assistance: 6: Modified independent (Device/Increase time) Dynamic Standing Balance Dynamic Standing - Balance Support: During functional activity Dynamic Standing - Level of Assistance: 5: Stand by assistance  12/20/20: Dustin Golden Balance scale 48/56, 12/21/20 Functional Gait Assessment 18/30 Extremity Assessment  RLE Assessment RLE Assessment: Within Functional Limits General Strength Comments: Grossly 5/5 throughout in sitting LLE Assessment LLE Assessment: Within Functional Limits General Strength Comments: Grossly 5/5 throughout in sitting    Dustin Golden Dustin Golden PT, DPT  12/27/2020, 5:06 PM

## 2020-12-27 NOTE — Progress Notes (Signed)
Patient ID: Dustin Golden, male   DOB: [birth year], 65 y.o.   MRN: [id number] pt set up with rehab without [place] for follow up therapies. Pt's daughter bought him a tb seat no other equipment needs. Ready for discharge tomorrow.

## 2020-12-27 NOTE — Progress Notes (Signed)
PROGRESS NOTE   Subjective/Complaints: Up in chair. No new complaints. Excited about getting home.   ROS: Patient denies fever, rash, sore throat, blurred vision, nausea, vomiting, diarrhea, cough, shortness of breath or chest pain, joint or back pain, headache, or mood change.    Objective:   No results found. No results for input(s): WBC, HGB, HCT, PLT in the last 72 hours. Recent Labs    12/27/20 0442  NA 135  K 4.1  CL 101  CO2 25  GLUCOSE 112*  BUN 10  CREATININE 1.09  CALCIUM 9.0    Intake/Output Summary (Last 24 hours) at 12/27/2020 1015 Last data filed at 12/27/2020 0900 Gross per 24 hour  Intake 840 ml  Output 900 ml  Net -60 ml        Physical Exam: Vital Signs Blood pressure 110/82, pulse 68, temperature 97.6 F (36.4 C), temperature source Oral, resp. rate 17, height 6\' 4"  (1.93 m), weight 83.4 kg, SpO2 98 %. Constitutional: No distress . Vital signs reviewed. HEENT: EOMI, oral membranes moist Neck: supple Cardiovascular: RRR without murmur. No JVD    Respiratory/Chest: CTA Bilaterally without wheezes or rales. Normal effort    GI/Abdomen: BS +, non-tender, non-distended Ext: no clubbing, cyanosis, or edema Psych: pleasant and cooperative Skin: healing abrasions Musc: No edema in extremities.  No tenderness in extremities. Neuro: Alert, dysarthric, word finding deficits.  Motor: LUE/LE: 5/5 proximal distal RUE/RLE: 4/5 proximal distal   Assessment/Plan: 1. Functional deficits which require 3+ hours per day of interdisciplinary therapy in a comprehensive inpatient rehab setting.  Physiatrist is providing close team supervision and 24 hour management of active medical problems listed below.  Physiatrist and rehab team continue to assess barriers to discharge/monitor patient progress toward functional and medical goals  Care Tool:  Bathing    Body parts bathed by patient: Left  arm,Chest,Abdomen,Front perineal area,Right upper leg,Left upper leg,Face,Right arm,Buttocks,Right lower leg,Left lower leg   Body parts bathed by helper: Right arm,Buttocks,Right lower leg,Left lower leg     Bathing assist Assist Level: Set up assist     Upper Body Dressing/Undressing Upper body dressing   What is the patient wearing?: Pull over shirt    Upper body assist Assist Level: Supervision/Verbal cueing    Lower Body Dressing/Undressing Lower body dressing      What is the patient wearing?: Pants,Underwear/pull up     Lower body assist Assist for lower body dressing: Independent with assitive device     Toileting Toileting    Toileting assist Assist for toileting: Supervision/Verbal cueing     Transfers Chair/bed transfer  Transfers assist     Chair/bed transfer assist level: Independent     Locomotion Ambulation   Ambulation assist      Assist level: Supervision/Verbal cueing Assistive device: No Device Max distance: >200 ft   Walk 10 feet activity   Assist     Assist level: Supervision/Verbal cueing Assistive device: No Device   Walk 50 feet activity   Assist    Assist level: Supervision/Verbal cueing Assistive device: No Device    Walk 150 feet activity   Assist    Assist level: Supervision/Verbal cueing Assistive device: No Device  Walk 10 feet on uneven surface  activity   Assist     Assist level: Minimal Assistance - Patient > 75% Assistive device: Hand held assist   Wheelchair     Assist Will patient use wheelchair at discharge?: No             Wheelchair 50 feet with 2 turns activity    Assist            Wheelchair 150 feet activity     Assist          Blood pressure 110/82, pulse 68, temperature 97.6 F (36.4 C), temperature source Oral, resp. rate 17, height 6\' 4"  (1.93 m), weight 83.4 kg, SpO2 98 %.  Medical Problem List and Plan: 1. 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 Status post mechanical thrombectomy  Continue CIR--finalize dc planning for 3/15  Patient to see MD in the office for transitional care encounter in 1-2 weeks.  2. Antithrombotics: -DVT/anticoagulation: Continue SCDs, ambulating >150 feet -antiplatelet therapy: Continue Aspirin 81 mg daily and Brilinta 90 mg twice daily 3. Pain Management: Continue Tylenol as needed- last required 3/6 4. Mood: Provide emotional support -antipsychotic agents: N/A 5. Neuropsych: This patient is capable of making decisions on his own behalf. 6. Skin/Wound Care: Routine skin checks 7. Fluids/Electrolytes/Nutrition: Routine in and outs 8. Hypertension.   Continue amlodipine to 10mg .   Controlled on 3/14 9. History of alcohol tobacco as well as polysubstance abuse. Provide counseling 10. Hyperlipidemia. Continue Zocor 11. Mechanical fall 12/15/2020. Sustained small abrasion right knee and large right facial scalp hematoma. Follow-up CT scan unchanged 12.  Patient reported discharge from right eye   Started on tobramycin ophthalmic drops.  Continue x7 days on a 4 times daily basis---appears improved 13. AKI:   Creatinine 1.11 on 3/8---> 1.09 3/14  resolved 14. 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 Ordered incentive spirometer to improve breathing strength and ability over time.  15.  Hyponatremia  Sodium 134 on 3/8---> 135 3/14  LOS: 10 days A FACE TO [place] 12/27/2020, 10:15 AM

## 2020-12-27 NOTE — Progress Notes (Signed)
Occupational Therapy Session Note  Patient Details  Name: Dustin Golden MRN: [id number] Date of Birth: August 13, 1956  Today's Date: 12/27/2020 OT Individual Time: 9494-4739 OT Individual Time Calculation (min): 70 min    Short Term Goals: Week 1:  OT Short Term Goal 1 (Week 1): Pt will transfer to toilet wiht CGA OT Short Term Goal 2 (Week 1): Pt will don B socks with S OT Short Term Goal 3 (Week 1): Pt will thread BLE into pants wiht S OT Short Term Goal 4 (Week 1): Pt will use RUE to complete oral care  Skilled Therapeutic Interventions/Progress Updates:    Pt standing at sink with NT upon entry to room. Pt ambulating around room at supervision level, gathering clothes out of bottom drawer with no LOB or safety concern. Pt donned pants with mod I overall. He required set up assist for breakfast, managing small containers. He donned built up handle himself and was able to use his RUE to feed himself 75% of meal. Pt with compensatory movement pattern at shoulder, increased abduction and IR to reduce distal needs of pronation and wrist radial deviation. Discussed d/c planning throughout session. Pt completed oral care with supervision in standing with moderate oral residue. Pt donned/doffed new shirt with mod I overall. Pt was left sitting up in the recliner with all needs met.   Therapy Documentation Precautions:  Precautions Precautions: Fall Precaution Comments: MILDLY impulsive, decreased safety awarenness, R inattention Restrictions Weight Bearing Restrictions: No   Therapy/Group: Individual Therapy  Curtis Sites 12/27/2020, 6:26 AM

## 2020-12-27 NOTE — Progress Notes (Signed)
Speech Language Pathology Discharge Summary  Patient Details  Name: Dustin Golden MRN: [id number] Date of Birth: 06-10-1956  Today's Date: 12/27/2020 SLP Individual Time: 1300-1345 SLP Individual Time Calculation (min): 45 min   Skilled Therapeutic Interventions:  Skilled treatment session focused on communication goals and completion of family education with the patient's girlfriend. SLP facilitated session by providing education regarding patient's current swallowing function, diet recommendations, appropriate textures and swallowing compensatory strategies. Education  was also provided regarding speech intelligibility strategies and strategies to utilize to maximize speech and motor planning issues like articulatory cues. Patient's girlfriend verbalized understanding. Patient participated in a drill task of CVC words that contained mostly consonant blends. Patient required extra time and supervision level verbal and articulatory cues to self-monitor and correct errors with the mostly difficulty noted with the phoneme /r/. Patient left upright in recliner.   Patient has met 5 of 5 long term goals.  Patient to discharge at overall Modified Independent;Supervision level.   Reasons goals not met: N/A   Clinical Impression/Discharge Summary: Patient has made functional gains and has met 5 of 5 LTGs this admission. Currently, patient is consuming regular textures with thin liquids with minimal overt s/s of aspiration and is overall Mod I for use of swallowing compensatory strategies. Patient also demonstrates improved word-finding and speech intelligibility and can express his wants/needs at the conversation level with overall supervision level verbal cues for use of speech intelligibility strategies. Patient and family education is complete and patient will discharge home with assistance from family. Patient would benefit from outpatient SLP services to maximize his swallowing function and functional  communication.   Care Partner:  Caregiver Able to Provide Assistance: Yes     Recommendation:  Outpatient SLP  Rationale for SLP Follow Up: Maximize cognitive function and independence;Maximize swallowing safety;Reduce caregiver burden   Equipment: N/A   Reasons for discharge: Discharged from hospital;Treatment goals met   Patient/Family Agrees with Progress Made and Goals Achieved: Yes    Salathiel Ferrara 12/27/2020, 6:31 AM

## 2020-12-27 NOTE — Discharge Instructions (Signed)
STROKE/TIA DISCHARGE INSTRUCTIONS SMOKING Cigarette smoking nearly doubles your risk of having a stroke & is the single most alterable risk factor  If you smoke or have smoked in the last 12 months, you are advised to quit smoking for your health.  Most of the excess cardiovascular risk related to smoking disappears within a year of stopping.  Ask you doctor about anti-smoking medications   Quit Line: 1-800-QUIT NOW  Free Smoking Cessation Classes [phone]  CHOLESTEROL Know your levels; limit fat & cholesterol in your diet  Lipid Panel     Component Value Date/Time   CHOL 179 12/13/2020 0555   TRIG 81 12/13/2020 0555   HDL 54 12/13/2020 0555   CHOLHDL 3.3 12/13/2020 0555   VLDL 16 12/13/2020 0555   LDLCALC 109 (H) 12/13/2020 0555      Many patients benefit from treatment even if their cholesterol is at goal.  Goal: Total Cholesterol (CHOL) less than 160  Goal:  Triglycerides (TRIG) less than 150  Goal:  HDL greater than 40  Goal:  LDL (LDLCALC) less than 100   BLOOD PRESSURE American Stroke Association blood pressure target is less that 120/80 mm/Hg  Your discharge blood pressure is:  BP: 108/81  Monitor your blood pressure  Limit your salt and alcohol intake  Many individuals will require more than one medication for high blood pressure  DIABETES (A1c is a blood sugar average for last 3 months) Goal HGBA1c is under 7% (HBGA1c is blood sugar average for last 3 months)  Diabetes:    Lab Results  Component Value Date   HGBA1C 6.8 (H) 12/13/2020     Your HGBA1c can be lowered with medications, healthy diet, and exercise.  Check your blood sugar as directed by your physician  Call your physician if you experience unexplained or low blood sugars.  PHYSICAL ACTIVITY/REHABILITATION Goal is 30 minutes at least 4 days per week  Activity: Increase activity slowly, Therapies: Physical Therapy: Home Health Return to work:   Activity decreases your risk of heart  attack and stroke and makes your heart stronger.  It helps control your weight and blood pressure; helps you relax and can improve your mood.  Participate in a regular exercise program.  Talk with your doctor about the best form of exercise for you (dancing, walking, swimming, cycling).  DIET/WEIGHT Goal is to maintain a healthy weight  Your discharge diet is:  Diet Order            DIET DYS 3 Room service appropriate? Yes; Fluid consistency: Thin  Diet effective now                 liquids Your height is:  Height: 6\' 4"  (193 cm) Your current weight is: Weight: 83.4 kg Your Body Mass Index (BMI) is:  BMI (Calculated): 22.39  Following the type of diet specifically designed for you will help prevent another stroke.  Your goal weight range is:    Your goal Body Mass Index (BMI) is 19-24.  Healthy food habits can help reduce 3 risk factors for stroke:  High cholesterol, hypertension, and excess weight.  RESOURCES Stroke/Support Group:  Call [phone]   STROKE EDUCATION PROVIDED/REVIEWED AND GIVEN TO PATIENT Stroke warning signs and symptoms How to activate emergency medical system (call 911). Medications prescribed at discharge. Need for follow-up after discharge. Personal risk factors for stroke. Pneumonia vaccine given:  Flu vaccine given:  My questions have been answered, the writing is legible, and I understand these instructions.  I will adhere to these goals & educational materials that have been provided to me after my discharge from the hospital.   Inpatient Rehab Discharge Instructions  Dustin Golden Discharge date and time: No discharge date for patient encounter.   Activities/Precautions/ Functional Status: Activity: As tolerated Diet: Regular Wound Care: Routine skin checks Functional status:  ___ No restrictions     ___ Walk up steps independently ___ 24/7 supervision/assistance   ___ Walk up steps with assistance ___ Intermittent  supervision/assistance  ___ Bathe/dress independently ___ Walk with walker     __x_ Bathe/dress with assistance ___ Walk Independently    ___ Shower independently ___ Walk with assistance    ___ Shower with assistance ___ No alcohol     ___ Return to work/school ________  Special Instructions: No driving smoking or alcohol    COMMUNITY REFERRALS UPON DISCHARGE:    Outpatient: PT OT SP             Agency:[facility]  Phone:[phone]              Appointment Date/Time:WILL CALL REGARDING SETTING UP FOLLOW UP APPOINTMENTS  Medical Equipment/Items Ordered:DAUGHTER BOUGHT A TUB SEAT FOR DAD                                                 Agency/Supplier: NA   My questions have been answered and I understand these instructions. I will adhere to these goals and the provided educational materials after my discharge from the hospital.  Patient/Caregiver Signature _______________________________ Date __________  Clinician Signature _______________________________________ Date __________  Please bring this form and your medication list with you to all your follow-up doctor's appointments.

## 2020-12-28 NOTE — Progress Notes (Signed)
Patient discharged off of unit with all belongings.  Discharge instructions given to patient by physician assistant.  Patient and family have no questions at this time.  No complications noted.

## 2020-12-28 NOTE — Progress Notes (Signed)
PROGRESS NOTE   Subjective/Complaints: No issues over night. Excited about getting home. Feels prepared  ROS: Patient denies fever, rash, sore throat, blurred vision, nausea, vomiting, diarrhea, cough, shortness of breath or chest pain, joint or back pain, headache, or mood change.   Objective:   No results found. No results for input(s): WBC, HGB, HCT, PLT in the last 72 hours. Recent Labs    12/27/20 0442  NA 135  K 4.1  CL 101  CO2 25  GLUCOSE 112*  BUN 10  CREATININE 1.09  CALCIUM 9.0    Intake/Output Summary (Last 24 hours) at 12/28/2020 0916 Last data filed at 12/28/2020 0851 Gross per 24 hour  Intake 480 ml  Output 700 ml  Net -220 ml        Physical Exam: Vital Signs Blood pressure 123/70, pulse 70, temperature 97.6 F (36.4 C), resp. rate 17, height 6\' 4"  (1.93 m), weight 83.4 kg, SpO2 100 %. Constitutional: No distress . Vital signs reviewed. HEENT: EOMI, oral membranes moist Neck: supple Cardiovascular: RRR without murmur. No JVD    Respiratory/Chest: CTA Bilaterally without wheezes or rales. Normal effort    GI/Abdomen: BS +, non-tender, non-distended Ext: no clubbing, cyanosis, or edema Psych: pleasant and cooperative Skin: healing abrasions Musc: No edema in extremities.  No tenderness in extremities. Neuro: Alert, dysarthric, word finding deficits persist but able to communicate thoughts.  Motor: LUE/LE: 5/5 proximal distal RUE/RLE: 5/5 proximal distal   Assessment/Plan: 1. Functional deficits which require 3+ hours per day of interdisciplinary therapy in a comprehensive inpatient rehab setting.  Physiatrist is providing close team supervision and 24 hour management of active medical problems listed below.  Physiatrist and rehab team continue to assess barriers to discharge/monitor patient progress toward functional and medical goals  Care Tool:  Bathing    Body parts bathed by  patient: Left arm,Chest,Abdomen,Front perineal area,Right upper leg,Left upper leg,Face,Right arm,Buttocks,Right lower leg,Left lower leg   Body parts bathed by helper: Right arm,Buttocks,Right lower leg,Left lower leg     Bathing assist Assist Level: Set up assist     Upper Body Dressing/Undressing Upper body dressing   What is the patient wearing?: Pull over shirt    Upper body assist Assist Level: Supervision/Verbal cueing    Lower Body Dressing/Undressing Lower body dressing      What is the patient wearing?: Pants,Underwear/pull up     Lower body assist Assist for lower body dressing: Independent with assitive device     Toileting Toileting    Toileting assist Assist for toileting: Supervision/Verbal cueing     Transfers Chair/bed transfer  Transfers assist     Chair/bed transfer assist level: Independent     Locomotion Ambulation   Ambulation assist      Assist level: Supervision/Verbal cueing Assistive device: No Device Max distance: >200 ft   Walk 10 feet activity   Assist     Assist level: Supervision/Verbal cueing Assistive device: No Device   Walk 50 feet activity   Assist    Assist level: Supervision/Verbal cueing Assistive device: No Device    Walk 150 feet activity   Assist    Assist level: Supervision/Verbal cueing Assistive device: No  Device    Walk 10 feet on uneven surface  activity   Assist     Assist level: Minimal Assistance - Patient > 75% Assistive device: Hand held assist   Wheelchair     Assist Will patient use wheelchair at discharge?: No             Wheelchair 50 feet with 2 turns activity    Assist            Wheelchair 150 feet activity     Assist          Blood pressure 123/70, pulse 70, temperature 97.6 F (36.4 C), resp. rate 17, height 6\' 4"  (1.93 m), weight 83.4 kg, SpO2 100 %.  Medical Problem List and Plan: 1. 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 Status post mechanical thrombectomy  DC home today  Patient to see MD in the office for transitional care encounter in 1-2 weeks.  2. Antithrombotics: -DVT/anticoagulation: Continue SCDs, ambulating >150 feet -antiplatelet therapy: Continue Aspirin 81 mg daily and Brilinta 90 mg twice daily 3. Pain Management: Continue Tylenol as needed- last required 3/6 4. Mood: Provide emotional support -antipsychotic agents: N/A 5. Neuropsych: This patient is capable of making decisions on his own behalf. 6. Skin/Wound Care: Routine skin checks 7. Fluids/Electrolytes/Nutrition: Routine in and outs 8. Hypertension.   Continue amlodipine to 10mg .   Controlled on 3/15 9. History of alcohol tobacco as well as polysubstance abuse. Provide counseling 10. Hyperlipidemia. Continue Zocor 11. Mechanical fall 12/15/2020. Sustained small abrasion right knee and large right facial scalp hematoma. Follow-up CT scan unchanged 12.  Patient reported discharge from right eye   Started on tobramycin ophthalmic drops.  Continue x7 days on a 4 times daily basis---appears resolved 13. AKI:   Creatinine 1.11 on 3/8---> 1.09 3/14  resolved 14. 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 Ordered incentive spirometer to improve breathing strength and ability over time.  15.  Hyponatremia  Sodium 134 on 3/8---> 135 3/14  LOS: 11 days A FACE TO FACE EVALUATION WAS PERFORMED  Meredith Staggers 12/28/2020, 9:16 AM

## 2020-12-30 ENCOUNTER — Telehealth: Payer: Self-pay

## 2020-12-30 NOTE — Telephone Encounter (Signed)
Transitional Care Call--who you spoke with Shawnee Knapp (Daughter).    1. Are you/is patient experiencing any problems since coming home? No.  Are there any questions regarding any aspect of care? No 2. Are there any questions regarding medications administration/dosing? Yes. Cholesterol medication regimen. She will call back after looking at bottles. Are meds being taken as prescribed? Yes.  Patient should review meds with caller to confirm. Done.  3. Have there been any falls? None 4. Has Home Health been to the house and/or have they contacted you?Yes.  If not, have you tried to contact them? N/A. Can we help you contact them?No need.  5. Are bowels and bladder emptying properly? Yes.  Are there any unexpected incontinence issues? No.  If applicable, is patient following bowel/bladder programs?No problem. Any fevers, problems with breathing, unexpected pain? None.  6. Are there any skin problems or new areas of breakdown? None.  7. Has the patient/family member arranged specialty MD follow up (ie cardiology/neurology/renal/surgical/etc)? Yes & follow up information given.    Can we help arrange?No need.  8. Does the patient need any other services or support that we can help arrange? No.  9. Are caregivers following through as expected in assisting the patient?Yes.         11. Has the patient quit smoking, drinking alcohol, or using drugs as recommended? Yes, per Daughter.  Appointment Date/Time/ Arrival time/ and who they are seeing  [address] To see Danella Sensing NP on 01/05/2021 at 1:00 PM.

## 2020-12-31 MED ORDER — ATORVASTATIN CALCIUM 20 MG PO TABS: 20.0000 mg | ORAL_TABLET | Freq: Every day | ORAL | 0 refills | Status: DC

## 2020-12-31 NOTE — Telephone Encounter (Signed)
Per Algis Liming, PA he should be taking the atorvastatin 20 mg q 6 pm.  Family notified.

## 2020-12-31 NOTE — Telephone Encounter (Addendum)
Mr [place] daughter Samuell Knoble called back and reports that the bottle they have are Simvastatin 40 mg daily @6  pm and Atorvastatin 20 mg daily @ 6 pm.  I have sent a message to Algis Liming, PA to clarify.

## 2021-01-05 ENCOUNTER — Encounter: Payer: Commercial Managed Care - PPO | Attending: Registered Nurse | Admitting: Registered Nurse

## 2021-01-05 ENCOUNTER — Other Ambulatory Visit: Payer: Self-pay

## 2021-01-05 VITALS — BP 119/75 | HR 80 | Temp 98.5°F | Ht 75.0 in | Wt 192.2 lb

## 2021-01-05 DIAGNOSIS — I10 Essential (primary) hypertension: Secondary | ICD-10-CM | POA: Diagnosis not present

## 2021-01-05 DIAGNOSIS — F1920 Other psychoactive substance dependence, uncomplicated: Secondary | ICD-10-CM | POA: Diagnosis not present

## 2021-01-05 DIAGNOSIS — I63512 Cerebral infarction due to unspecified occlusion or stenosis of left middle cerebral artery: Secondary | ICD-10-CM | POA: Insufficient documentation

## 2021-01-05 MED ORDER — LISINOPRIL 2.5 MG PO TABS: 2.5000 mg | ORAL_TABLET | Freq: Every day | ORAL | 0 refills | Status: DC

## 2021-01-05 MED ORDER — ATORVASTATIN CALCIUM 20 MG PO TABS: 20.0000 mg | ORAL_TABLET | Freq: Every day | ORAL | 0 refills | Status: DC

## 2021-01-05 MED ORDER — AMLODIPINE BESYLATE 10 MG PO TABS: 10.0000 mg | ORAL_TABLET | Freq: Every day | ORAL | 0 refills | Status: DC

## 2021-01-05 NOTE — Patient Instructions (Signed)
Dustin Golden Your Blood pressure medication was ordered today. You need to obtain a Primary care Physician .

## 2021-01-05 NOTE — Progress Notes (Signed)
Subjective:    Patient ID: Dustin Golden, male    DOB: 1956-08-15, 65 y.o.   MRN: [id number]  HPI: Dustin Golden is a 65 y.o. male who is here for Transitional Care Visit for the following, Left Middle Cerebral Artery Stroke, Essential Hypertension and Polysubstance Abuse.  He presented to [facility] via EMS on 12/13/2020 for right sided weakness. Neurology Following.  CT Head WO Contrast:  IMPRESSION: 1. Normal head CT.  CT Code Stroke CTA Head W/WO Contrast:  IMPRESSION: Occlusion of the left ICA at the origin. Reconstitution in the siphon probably from a combination of external to internal collaterals and patent communicating arteries. I cannot clearly identify the occluded MCA branch vessel, though there must be one, probably in the M3 region. 16 cc completed infarction in the left frontoparietal junction region. Additional 42 cc at risk brain surrounding that region.  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  30-50 % stenoses of both vertebral artery origins.  MR Brain WO Contrast:  IMPRESSION: 1. Acute infarct left posterior MCA distribution involving the posterior insula in the left frontal parietal cortex. Small areas of acute infarct in the frontal lobes bilaterally. 2. Motion degraded study. 3. MRA demonstrates loss of signal in the right posterior cerebral artery. This vessel was patent on CTA earlier today. Possible artifact versus thrombus. 4. There is moderate stenosis in the middle cerebral artery M1 segment bilaterally. Left internal carotid artery appears widely Patent.  On 12/13/2020 Urine Rapid Drug Screen was positive for Cocaine and THC.   Dustin Golden underwent on 12/13/2020 Per Dr Leonie Man Note:  1.Left ICA and left MCA/M2 stroke likely secondary to vessel to vessel embolism from high-grade proximal left ICA stenosis treated with mechanical thrombectomy followed by rescue left internal  carotid artery and rescue stenting.  Dustin Golden was maintained on aspirin and Brillinta for CVA Prophylaxis.  Dustin Golden was admitted to inpatient rehabilitation on 12/17/2020 and discharged home on 12/28/2020. He is receiving Outpatient therapy at [facility]. He denies any pain at this time. He rates his pain 0. Also stated at the end of visit at times he has tingling in his right lower extremity.   Educated Dustin Golden about being compliant with his antihypertensive medication, he was argumentative about being placed on anti-hypertensive medication. Reviewed H&P and discussed the events which brought him into the hospital in detail. He doesn't have a PCP he was instructed to obtain a PCP he states he has insurance, discussed [employer] [facility]. He verbalizes understanding. Anti-hypertensive medication was refilled, to give him time to obtain a PCP he verbalizes understanding.   Spoke with Dustin Golden and his girlfriend in detail about being compliant with medication and what adverse events can occur they verbalize understanding.   Pain Inventory Average Pain 0 Pain Right Now 0 My pain is na  LOCATION OF PAIN  na  BOWEL Number of stools per week: 7 Oral laxative use No  Type of laxative na Enema or suppository use No  History of colostomy No  Incontinent No   BLADDER Normal In and out cath, frequency na Able to self cath na Bladder incontinence No  Frequent urination No  Leakage with coughing No  Difficulty starting stream No  Incomplete bladder emptying No    Mobility walk without assistance how many minutes can you walk? 30+ ability to climb steps?  yes do you drive?  no  Function  employed # of hrs/week 12/11/20 not employed: date last employed . disabled: date disabled . I need assistance with the following:  meal prep  Neuro/Psych weakness numbness tingling  Prior Studies TC appt  Physicians involved in your care TC appt   No family  history on file. Social History   Socioeconomic History  . Marital status: Unknown    Spouse name: Not on file  . Number of children: Not on file  . Years of education: Not on file  . Highest education level: Not on file  Occupational History  . Not on file  Tobacco Use  . Smoking status: Current Every Day Smoker    Packs/day: 2.00    Years: 48.00    Pack years: 96.00  . Smokeless tobacco: Never Used  Vaping Use  . Vaping Use: Never used  Substance and Sexual Activity  . Alcohol use: Yes    Comment: weekly  . Drug use: Yes    Types: Marijuana  . Sexual activity: Yes  Other Topics Concern  . Not on file  Social History Narrative  . Not on file   Social Determinants of Health   Financial Resource Strain: Not on file  Food Insecurity: Not on file  Transportation Needs: Not on file  Physical Activity: Not on file  Stress: Not on file  Social Connections: Not on file   Past Surgical History:  Procedure Laterality Date  . APPENDECTOMY    . gastric ulcer surgery    . IR CT HEAD LTD  12/13/2020  . IR INTRAVSC STENT CERV CAROTID W/EMB-PROT MOD SED INCL ANGIO  12/14/2020  . IR PERCUTANEOUS ART THROMBECTOMY/INFUSION INTRACRANIAL INC DIAG ANGIO  12/13/2020  . IR US GUIDE VASC ACCESS RIGHT  12/13/2020  . RADIOLOGY WITH ANESTHESIA N/A 12/13/2020   Procedure: IR WITH ANESTHESIA;  Surgeon: Luanne Bras, MD;  Location: [place];  Service: Radiology;  Laterality: N/A;   Past Medical History:  Diagnosis Date  . Hypertension    BP 119/75   Pulse 80   Temp 98.5 F (36.9 C)   Ht 6\' 3"  (1.905 m)   Wt 192 lb 3.2 oz (87.2 kg)   SpO2 97%   BMI 24.02 kg/m   Opioid Risk Score:   Fall Risk Score:  `1  Depression screen PHQ 2/9  Depression screen PHQ 2/9 01/05/2021  Decreased Interest 0  Down, Depressed, Hopeless 0  PHQ - 2 Score 0    Review of Systems  Constitutional: Positive for unexpected weight change.  All other systems reviewed and are negative.      Objective:    Physical Exam Vitals and nursing note reviewed.  Constitutional:      Appearance: Normal appearance.  Cardiovascular:     Rate and Rhythm: Normal rate and regular rhythm.     Pulses: Normal pulses.     Heart sounds: Normal heart sounds.  Pulmonary:     Effort: Pulmonary effort is normal.     Breath sounds: Normal breath sounds.  Musculoskeletal:     Cervical back: Normal range of motion and neck supple.     Comments: Normal Muscle Bulk and Muscle Testing Reveals:  Upper Extremities: Full ROM and Muscle Strength 5/5  Lower Extremities: Full ROM and Muscle Strength 5/5 Right Lower Extremity Flexion Produced Tingling into his right lower extremity.  Arises from Table with ease Narrow Based  Gait   Skin:    General: Skin is warm and dry.  Neurological:     Mental Status:  He is alert and oriented to person, place, and time.  Psychiatric:        Mood and Affect: Mood normal.        Behavior: Behavior normal.           Assessment & Plan:  1. Left Middle Cerebral Artery Stroke: Continue Outpatient Rehabilitation at [facility]. Continue Current medication regimen. He has a scheduled appointment with Neurology.  2., Essential Hypertension : Continue current medication regimen. Educated on medication compliance, see above  HPI note, he verbalizes understanding.  3. Polysubstance Abuse: Educated on cessation of illicit drug use, he verbalizes understanding.   F/U in 4- 6 weeks with Dr Ranell Patrick

## 2021-01-06 ENCOUNTER — Encounter: Payer: Self-pay | Admitting: Registered Nurse

## 2021-01-17 ENCOUNTER — Inpatient Hospital Stay (HOSPITAL_BASED_OUTPATIENT_CLINIC_OR_DEPARTMENT_OTHER)
Admission: EM | Admit: 2021-01-17 | Discharge: 2021-02-07 | DRG: 330 | Disposition: A | Discharge status: Home / Self Care | Payer: Commercial Managed Care - PPO | Attending: Internal Medicine | Admitting: Internal Medicine

## 2021-01-17 ENCOUNTER — Other Ambulatory Visit: Payer: Self-pay

## 2021-01-17 ENCOUNTER — Encounter (HOSPITAL_BASED_OUTPATIENT_CLINIC_OR_DEPARTMENT_OTHER): Payer: Self-pay | Admitting: *Deleted

## 2021-01-17 DIAGNOSIS — Z95820 Peripheral vascular angioplasty status with implants and grafts: Secondary | ICD-10-CM

## 2021-01-17 DIAGNOSIS — I6612 Occlusion and stenosis of left anterior cerebral artery: Secondary | ICD-10-CM | POA: Diagnosis not present

## 2021-01-17 DIAGNOSIS — I69351 Hemiplegia and hemiparesis following cerebral infarction affecting right dominant side: Secondary | ICD-10-CM

## 2021-01-17 DIAGNOSIS — Z5331 Laparoscopic surgical procedure converted to open procedure: Secondary | ICD-10-CM

## 2021-01-17 DIAGNOSIS — I10 Essential (primary) hypertension: Secondary | ICD-10-CM | POA: Diagnosis not present

## 2021-01-17 DIAGNOSIS — K56609 Unspecified intestinal obstruction, unspecified as to partial versus complete obstruction: Secondary | ICD-10-CM

## 2021-01-17 DIAGNOSIS — Z87891 Personal history of nicotine dependence: Secondary | ICD-10-CM

## 2021-01-17 DIAGNOSIS — Z8673 Personal history of transient ischemic attack (TIA), and cerebral infarction without residual deficits: Secondary | ICD-10-CM

## 2021-01-17 DIAGNOSIS — R159 Full incontinence of feces: Secondary | ICD-10-CM | POA: Diagnosis not present

## 2021-01-17 DIAGNOSIS — N179 Acute kidney failure, unspecified: Secondary | ICD-10-CM | POA: Diagnosis not present

## 2021-01-17 DIAGNOSIS — F1210 Cannabis abuse, uncomplicated: Secondary | ICD-10-CM | POA: Diagnosis not present

## 2021-01-17 DIAGNOSIS — Z20822 Contact with and (suspected) exposure to covid-19: Secondary | ICD-10-CM | POA: Diagnosis not present

## 2021-01-17 DIAGNOSIS — C180 Malignant neoplasm of cecum: Secondary | ICD-10-CM | POA: Diagnosis not present

## 2021-01-17 DIAGNOSIS — E871 Hypo-osmolality and hyponatremia: Secondary | ICD-10-CM | POA: Diagnosis not present

## 2021-01-17 DIAGNOSIS — K660 Peritoneal adhesions (postprocedural) (postinfection): Secondary | ICD-10-CM | POA: Diagnosis present

## 2021-01-17 DIAGNOSIS — I6522 Occlusion and stenosis of left carotid artery: Secondary | ICD-10-CM | POA: Diagnosis not present

## 2021-01-17 DIAGNOSIS — I959 Hypotension, unspecified: Secondary | ICD-10-CM | POA: Diagnosis not present

## 2021-01-17 DIAGNOSIS — F1410 Cocaine abuse, uncomplicated: Secondary | ICD-10-CM | POA: Diagnosis present

## 2021-01-17 DIAGNOSIS — D62 Acute posthemorrhagic anemia: Secondary | ICD-10-CM | POA: Diagnosis not present

## 2021-01-17 DIAGNOSIS — K449 Diaphragmatic hernia without obstruction or gangrene: Secondary | ICD-10-CM | POA: Diagnosis not present

## 2021-01-17 DIAGNOSIS — E785 Hyperlipidemia, unspecified: Secondary | ICD-10-CM | POA: Diagnosis present

## 2021-01-17 DIAGNOSIS — K5730 Diverticulosis of large intestine without perforation or abscess without bleeding: Secondary | ICD-10-CM | POA: Diagnosis present

## 2021-01-17 DIAGNOSIS — E876 Hypokalemia: Secondary | ICD-10-CM | POA: Diagnosis not present

## 2021-01-17 DIAGNOSIS — I63512 Cerebral infarction due to unspecified occlusion or stenosis of left middle cerebral artery: Secondary | ICD-10-CM | POA: Diagnosis present

## 2021-01-17 DIAGNOSIS — K922 Gastrointestinal hemorrhage, unspecified: Secondary | ICD-10-CM

## 2021-01-17 DIAGNOSIS — Z8249 Family history of ischemic heart disease and other diseases of the circulatory system: Secondary | ICD-10-CM

## 2021-01-17 DIAGNOSIS — I69322 Dysarthria following cerebral infarction: Secondary | ICD-10-CM

## 2021-01-17 DIAGNOSIS — Z4659 Encounter for fitting and adjustment of other gastrointestinal appliance and device: Secondary | ICD-10-CM

## 2021-01-17 DIAGNOSIS — F1020 Alcohol dependence, uncomplicated: Secondary | ICD-10-CM | POA: Diagnosis present

## 2021-01-17 DIAGNOSIS — E860 Dehydration: Secondary | ICD-10-CM | POA: Diagnosis not present

## 2021-01-17 DIAGNOSIS — K3189 Other diseases of stomach and duodenum: Secondary | ICD-10-CM | POA: Diagnosis not present

## 2021-01-17 DIAGNOSIS — Z7902 Long term (current) use of antithrombotics/antiplatelets: Secondary | ICD-10-CM

## 2021-01-17 DIAGNOSIS — Z886 Allergy status to analgesic agent status: Secondary | ICD-10-CM

## 2021-01-17 DIAGNOSIS — K9189 Other postprocedural complications and disorders of digestive system: Secondary | ICD-10-CM | POA: Diagnosis not present

## 2021-01-17 DIAGNOSIS — K567 Ileus, unspecified: Secondary | ICD-10-CM | POA: Diagnosis not present

## 2021-01-17 DIAGNOSIS — Z882 Allergy status to sulfonamides status: Secondary | ICD-10-CM

## 2021-01-17 DIAGNOSIS — K5669 Other partial intestinal obstruction: Secondary | ICD-10-CM | POA: Diagnosis present

## 2021-01-17 DIAGNOSIS — Z79899 Other long term (current) drug therapy: Secondary | ICD-10-CM

## 2021-01-17 DIAGNOSIS — C189 Malignant neoplasm of colon, unspecified: Secondary | ICD-10-CM

## 2021-01-17 DIAGNOSIS — K635 Polyp of colon: Secondary | ICD-10-CM | POA: Diagnosis present

## 2021-01-17 DIAGNOSIS — Z978 Presence of other specified devices: Secondary | ICD-10-CM

## 2021-01-17 DIAGNOSIS — R066 Hiccough: Secondary | ICD-10-CM | POA: Diagnosis not present

## 2021-01-17 DIAGNOSIS — Z7982 Long term (current) use of aspirin: Secondary | ICD-10-CM

## 2021-01-17 HISTORY — DX: Gastrointestinal hemorrhage, unspecified: K92.2

## 2021-01-17 HISTORY — DX: Cerebral infarction, unspecified: I63.9

## 2021-01-17 HISTORY — DX: Hyperlipidemia, unspecified: E78.5

## 2021-01-17 HISTORY — DX: Personal history of transient ischemic attack (TIA), and cerebral infarction without residual deficits: Z86.73

## 2021-01-17 LAB — COMPREHENSIVE METABOLIC PANEL
ALT: 19 U/L (ref 0–44)
AST: 20 U/L (ref 15–41)
Albumin: 4.5 g/dL (ref 3.5–5.0)
Alkaline Phosphatase: 84 U/L (ref 38–126)
Anion gap: 9 (ref 5–15)
BUN: 13 mg/dL (ref 8–23)
CO2: 25 mmol/L (ref 22–32)
Calcium: 9.3 mg/dL (ref 8.9–10.3)
Chloride: 100 mmol/L (ref 98–111)
Creatinine, Ser: 1.01 mg/dL (ref 0.61–1.24)
GFR, Estimated: >60 mL/min (ref >60)
Glucose, Bld: 98 mg/dL (ref 70–99)
Potassium: 3.8 mmol/L (ref 3.5–5.1)
Sodium: 134 mmol/L — ABNORMAL LOW (ref 135–145)
Total Bilirubin: 0.6 mg/dL (ref 0.3–1.2)
Total Protein: 8 g/dL (ref 6.5–8.1)

## 2021-01-17 LAB — CBC
HCT: 33.4 % — ABNORMAL LOW (ref 39.0–52.0)
Hemoglobin: 11.2 g/dL — ABNORMAL LOW (ref 13.0–17.0)
MCH: 26.4 pg (ref 26.0–34.0)
MCHC: 33.5 g/dL (ref 30.0–36.0)
MCV: 78.6 fL — ABNORMAL LOW (ref 80.0–100.0)
Platelets: 238 10*3/uL (ref 150–400)
RBC: 4.25 MIL/uL (ref 4.22–5.81)
RDW: 13.9 % (ref 11.5–15.5)
WBC: 7.5 10*3/uL (ref 4.0–10.5)
nRBC: 0 % (ref 0.0–0.2)

## 2021-01-17 LAB — RESP PANEL BY RT-PCR (FLU A&B, COVID) ARPGX2
Influenza A by PCR: NEGATIVE
Influenza B by PCR: NEGATIVE
SARS Coronavirus 2 by RT PCR: NEGATIVE

## 2021-01-17 LAB — PROTIME-INR
INR: 0.9 (ref 0.8–1.2)
Prothrombin Time: 12.2 seconds (ref 11.4–15.2)

## 2021-01-17 MED ORDER — ACETAMINOPHEN 650 MG RE SUPP: 650.0000 mg | Freq: Four times a day (QID) | RECTAL | Status: DC | PRN

## 2021-01-17 MED ORDER — ONDANSETRON HCL 4 MG PO TABS: 4.0000 mg | ORAL_TABLET | Freq: Four times a day (QID) | ORAL | Status: DC | PRN

## 2021-01-17 MED ORDER — SODIUM CHLORIDE 0.9 % IV SOLN
80.0000 mg | Freq: Once | INTRAVENOUS | Status: AC
  Administered 2021-01-17: 80 mg via INTRAVENOUS
  Filled 2021-01-17: qty 80

## 2021-01-17 MED ORDER — PANTOPRAZOLE SODIUM 40 MG IV SOLR
INTRAVENOUS | Status: AC
  Filled 2021-01-17: qty 160

## 2021-01-17 MED ORDER — ACETAMINOPHEN 325 MG PO TABS
650.0000 mg | ORAL_TABLET | Freq: Four times a day (QID) | ORAL | Status: DC | PRN
  Administered 2021-01-22 – 2021-01-24 (×2): 650 mg via ORAL
  Filled 2021-01-17 (×2): qty 2

## 2021-01-17 MED ORDER — SODIUM CHLORIDE 0.9 % IV SOLN
8.0000 mg/h | INTRAVENOUS | Status: DC
  Administered 2021-01-17: 8 mg/h via INTRAVENOUS
  Filled 2021-01-17 (×2): qty 80

## 2021-01-17 MED ORDER — ONDANSETRON HCL 4 MG/2ML IJ SOLN
4.0000 mg | Freq: Four times a day (QID) | INTRAMUSCULAR | Status: DC | PRN
  Administered 2021-01-26 – 2021-01-30 (×4): 4 mg via INTRAVENOUS
  Filled 2021-01-17 (×4): qty 2

## 2021-01-17 NOTE — ED Provider Notes (Signed)
Dustin Golden   CSN: [id number] Arrival date & time: 01/17/21  1331     History Chief Complaint  Patient presents with  . Rectal Bleeding    Dustin Golden is a 65 y.o. male.  The history is provided by the patient, medical records and a relative.  Rectal Bleeding  Dustin Golden is a 65 y.o. male who presents to the Emergency Department complaining of rectal bleeding.  He presents to the ED accompanied by his daughter for evaluation of BRBPR that started about one week ago.  He has one BM daily and has noticed that when the stool is in the water that blood is separating out.  He can smell the blood.  Initially stools had bright red blood but now it is more maroon in color.  He has associated dizziness and occasional SOB after BM.  Denies fever, abdominal pain, vomiting.  Has occasional nausea.  Recently was admitted for CVA and is currently on 81mg  ASA and brilinta.  No prior hx/o GI bleed, endoscopy.  Does not have a gastroenterologist.      Past Medical History:  Diagnosis Date  . Hypertension   . Stroke [facility])     Patient Active Problem List   Diagnosis Date Noted  . GIB (gastrointestinal bleeding) 01/17/2021  . Hyperlipidemia 01/17/2021  . History of stroke 01/17/2021  . Hyponatremia   . AKI (acute kidney injury) ([place])   . Eye discharge   . Essential hypertension   . Left middle cerebral artery stroke ([place]) 12/17/2020  . Stroke ([place]) 12/13/2020  . Polysubstance (excluding opioids) dependence ([place])     Past Surgical History:  Procedure Laterality Date  . APPENDECTOMY    . gastric ulcer surgery    . IR CT HEAD LTD  12/13/2020  . IR INTRAVSC STENT CERV CAROTID W/EMB-PROT MOD SED INCL ANGIO  12/14/2020  . IR PERCUTANEOUS ART THROMBECTOMY/INFUSION INTRACRANIAL INC DIAG ANGIO  12/13/2020  . IR US GUIDE VASC ACCESS RIGHT  12/13/2020  . RADIOLOGY WITH ANESTHESIA N/A 12/13/2020   Procedure: IR WITH ANESTHESIA;  Surgeon: Luanne Bras,  MD;  Location: [place];  Service: Radiology;  Laterality: N/A;       Family History  Problem Relation Age of Onset  . Hypertension Mother   . Hypertension Father     Social History   Tobacco Use  . Smoking status: Current Every Day Smoker    Packs/day: 2.00    Years: 48.00    Pack years: 96.00  . Smokeless tobacco: Never Used  Vaping Use  . Vaping Use: Never used  Substance Use Topics  . Alcohol use: Yes    Comment: weekly  . Drug use: Yes    Types: Marijuana    Home Medications Prior to Admission medications   Medication Sig Start Date End Date Taking? Authorizing Provider  acetaminophen (TYLENOL) 325 MG tablet Take 2 tablets (650 mg total) by mouth every 4 (four) hours as needed for mild pain (or temp > 37.5 C (99.5 F)). 12/17/20  Yes Bailey-Modzik, Delila A, NP  amLODipine (NORVASC) 10 MG tablet Take 1 tablet (10 mg total) by mouth daily. 01/05/21  Yes Bayard Hugger, NP  aspirin 81 MG chewable tablet Chew 1 tablet (81 mg total) by mouth daily. 12/18/20  Yes Bailey-Modzik, Delila A, NP  atorvastatin (LIPITOR) 20 MG tablet Take 1 tablet (20 mg total) by mouth daily. 01/05/21  Yes Bayard Hugger, NP  folic acid (FOLVITE) 1  MG tablet Take 1 tablet (1 mg total) by mouth daily. 12/27/20  Yes Angiulli, Lavon Paganini, PA-C  lisinopril (ZESTRIL) 2.5 MG tablet Take 1 tablet (2.5 mg total) by mouth daily. 01/05/21  Yes Bayard Hugger, NP  ticagrelor (BRILINTA) 90 MG TABS tablet Take 1 tablet (90 mg total) by mouth 2 (two) times daily. 12/27/20  Yes Angiulli, Lavon Paganini, PA-C    Allergies    Ibuprofen, Sulfa antibiotics, and Sulfamethoxazole  Review of Systems   Review of Systems  Gastrointestinal: Positive for hematochezia.  All other systems reviewed and are negative.   Physical Exam Updated Vital Signs BP 125/78 (BP Location: Right Arm)   Pulse 77   Temp 98.1 F (36.7 C) (Oral)   Resp 18   Ht 6\' 3"  (1.905 m)   Wt 87.2 kg   SpO2 99%   BMI 24.03 kg/m   Physical Exam Vitals  and nursing Golden reviewed.  Constitutional:      Appearance: He is well-developed.  HENT:     Head: Normocephalic and atraumatic.  Cardiovascular:     Rate and Rhythm: Normal rate and regular rhythm.     Heart sounds: No murmur heard.   Pulmonary:     Effort: Pulmonary effort is normal. No respiratory distress.     Breath sounds: Normal breath sounds.  Abdominal:     Palpations: Abdomen is soft.     Tenderness: There is no abdominal tenderness. There is no guarding or rebound.  Genitourinary:    Comments: Melena and dark red blood Musculoskeletal:        General: No swelling or tenderness.  Skin:    General: Skin is warm and dry.  Neurological:     Mental Status: He is alert and oriented to person, place, and time.  Psychiatric:        Behavior: Behavior normal.     ED Results / Procedures / Treatments   Labs (all labs ordered are listed, but only abnormal results are displayed) Labs Reviewed  COMPREHENSIVE METABOLIC PANEL - Abnormal; Notable for the following components:      Result Value   Sodium 134 (*)    All other components within normal limits  CBC - Abnormal; Notable for the following components:   Hemoglobin 11.2 (*)    HCT 33.4 (*)    MCV 78.6 (*)    All other components within normal limits  RESP PANEL BY RT-PCR (FLU A&B, COVID) ARPGX2  PROTIME-INR  CBC  BASIC METABOLIC PANEL  POC OCCULT BLOOD, ED    EKG EKG Interpretation  Date/Time:  Monday January 17 2021 14:30:46 EDT Ventricular Rate:  81 PR Interval:  165 QRS Duration: 118 QT Interval:  378 QTC Calculation: 439 R Axis:   -20 Text Interpretation: Sinus rhythm Atrial premature complexes Left ventricular hypertrophy Baseline wander in lead(s) V2 Confirmed by Quintella Reichert [phone]) on 01/17/2021 5:27:52 PM   Radiology No results found.  Procedures Procedures   Medications Ordered in ED Medications  pantoprazole (PROTONIX) 40 MG injection (  Canceled Entry 01/17/21 1513)  acetaminophen  (TYLENOL) tablet 650 mg (has no administration in time range)    Or  acetaminophen (TYLENOL) suppository 650 mg (has no administration in time range)  ondansetron (ZOFRAN) tablet 4 mg (has no administration in time range)    Or  ondansetron (ZOFRAN) injection 4 mg (has no administration in time range)  pantoprazole (PROTONIX) injection 40 mg (has no administration in time range)  amLODipine (NORVASC) tablet 10 mg (has  no administration in time range)  atorvastatin (LIPITOR) tablet 20 mg (has no administration in time range)  folic acid (FOLVITE) tablet 1 mg (has no administration in time range)  lisinopril (ZESTRIL) tablet 2.5 mg (has no administration in time range)  pantoprazole (PROTONIX) 80 mg in sodium chloride 0.9 % 100 mL IVPB (0 mg Intravenous Stopped 01/17/21 1527)    ED Course  I have reviewed the triage vital signs and the nursing notes.  Pertinent labs & imaging results that were available during my care of the patient were reviewed by me and considered in my medical decision making (see chart for details).    MDM Rules/Calculators/A&P                         patient here for evaluation of one week of bright red blood per rectum. On examination he has melanotic stool. Labs with hemoglobin of 11.2, down trending from 12 one month ago. He was started on Protonix for possible upper G.I. bleed. Plan to admit for further workup. Hospitalist consulted for admission. Patient updated findings of studies recommendation for admission and he is in agreement treatment plan.  Final Clinical Impression(s) / ED Diagnoses Final diagnoses:  Acute GI bleeding    Rx / DC Orders ED Discharge Orders    None       Quintella Reichert, MD 01/18/21 0107

## 2021-01-17 NOTE — H&P (Incomplete)
History and Physical    Dustin Golden UEA:[id number] DOB: April 28, 1956 DOA: 01/17/2021  PCP: Patient, No Pcp Per (Inactive)  Patient coming from: Home via [place]  I have personally briefly reviewed patient's old medical records in [place]  Chief Complaint: BRBPR  HPI: Dustin Golden is a 65 y.o. male with medical history significant for recent left ICA and left MCA/M2 stroke (thought vessel to vessel embolism from high-grade left ICA stenosis) s/p mechanical thrombectomy and rescue left ICA stenting, mild residual right-sided weakness and dysarthria, hypertension, hyperlipidemia, tobacco use who presents to the ED for evaluation of BRBPR.  As above, patient recently admitted 12/13/2020-12/16/2020 for right-sided weakness and aphasia due to to left ICA and left MCA/M2 stroke.  He was found to have high-grade left ICA stenosis and underwent mechanical thrombectomy followed by rescue left ICA stenting.  He was started on aspirin and Brilinta and ultimately discharged to inpatient rehab.  Patient states he has been taking his medications regularly.  He said about 1 week ago he noticed an unusual smell to his stool.  He then noticed seeing bright red blood mixed in with the stool and over the next few days a change in color to a dark maroon appearance.  He says he is now getting lightheaded after having bowel movements but has not lost consciousness or fallen.  He says he felt so weak he came to the ED for further evaluation.  [facility] ED Course:  Initial vitals showed BP 109/84, pulse 99, RR 18, temp 98.4 F, SPO2 100% on room air.  Labs show WBC 7.5, hemoglobin 11.2, platelets 238,000, sodium 134, potassium 3.8, bicarb 25, BUN 13, creatinine 1.01, serum glucose 98, LFTs within normal limits, INR 0.9.  SARS-CoV-2 and influenza A/B PCR is negative.  Patient had reported melena on ED examination.  Patient was started on IV Protonix infusion.  The hospitalist service was  consulted to admit for further evaluation and management.  Review of Systems: All systems reviewed and are negative except as documented in history of present illness above.   Past Medical History:  Diagnosis Date  . Hypertension   . Stroke [facility])     Past Surgical History:  Procedure Laterality Date  . APPENDECTOMY    . gastric ulcer surgery    . IR CT HEAD LTD  12/13/2020  . IR INTRAVSC STENT CERV CAROTID W/EMB-PROT MOD SED INCL ANGIO  12/14/2020  . IR PERCUTANEOUS ART THROMBECTOMY/INFUSION INTRACRANIAL INC DIAG ANGIO  12/13/2020  . IR US GUIDE VASC ACCESS RIGHT  12/13/2020  . RADIOLOGY WITH ANESTHESIA N/A 12/13/2020   Procedure: IR WITH ANESTHESIA;  Surgeon: Luanne Bras, MD;  Location: [place];  Service: Radiology;  Laterality: N/A;    Social History:  reports that he has been smoking. He has a 96.00 pack-year smoking history. He has never used smokeless tobacco. He reports current alcohol use. He reports current drug use. Drug: Marijuana.  Allergies  Allergen Reactions  . Ibuprofen Other (See Comments)    Nosebleeds   . Sulfa Antibiotics Rash and Other (See Comments)    "Blisters to skin with cream"  . Sulfamethoxazole Rash and Other (See Comments)    "Blisters to skin with cream"    Family History  Problem Relation Age of Onset  . Hypertension Mother   . Hypertension Father      Prior to Admission medications   Medication Sig Start Date End Date Taking? Authorizing Provider  acetaminophen (TYLENOL)  325 MG tablet Take 2 tablets (650 mg total) by mouth every 4 (four) hours as needed for mild pain (or temp > 37.5 C (99.5 F)). 12/17/20   Bailey-Modzik, Delila A, NP  amLODipine (NORVASC) 10 MG tablet Take 1 tablet (10 mg total) by mouth daily. 01/05/21   Bayard Hugger, NP  aspirin 81 MG chewable tablet Chew 1 tablet (81 mg total) by mouth daily. 12/18/20   Bailey-Modzik, Delila A, NP  atorvastatin (LIPITOR) 20 MG tablet Take 1 tablet (20 mg total) by mouth daily. 01/05/21    Bayard Hugger, NP  docusate sodium (COLACE) 100 MG capsule Take 1 capsule (100 mg total) by mouth 2 (two) times daily as needed for mild constipation. 12/17/20   Bailey-Modzik, Delila A, NP  folic acid (FOLVITE) 1 MG tablet Take 1 tablet (1 mg total) by mouth daily. 12/27/20   Angiulli, Lavon Paganini, PA-C  lisinopril (ZESTRIL) 2.5 MG tablet Take 1 tablet (2.5 mg total) by mouth daily. 01/05/21   Bayard Hugger, NP  Multiple Vitamin (MULTIVITAMIN WITH MINERALS) TABS tablet Take 1 tablet by mouth daily. 12/18/20   Bailey-Modzik, Delila A, NP  nicotine (NICODERM CQ - DOSED IN MG/24 HR) 7 mg/24hr patch 7 mg patch daily x2 weeks and stop 12/27/20   Angiulli, Lavon Paganini, PA-C  polyethylene glycol (MIRALAX / GLYCOLAX) 17 g packet Take 17 g by mouth daily as needed for moderate constipation. 12/17/20   Bailey-Modzik, Delila A, NP  ticagrelor (BRILINTA) 90 MG TABS tablet Take 1 tablet (90 mg total) by mouth 2 (two) times daily. 12/27/20   Cathlyn Parsons, PA-C    Physical Exam: Vitals:   01/17/21 1600 01/17/21 1700 01/17/21 1905 01/17/21 2117  BP: 124/82 (!) 144/84 (!) 152/90 125/78  Pulse: 79 94 75 77  Resp: 20 16 15 18   Temp:   98.6 F (37 C) 98.1 F (36.7 C)  TempSrc:   Oral Oral  SpO2: 99% 99% 99% 99%  Weight:      Height:       Constitutional: Resting in bed, NAD, calm, comfortable Eyes: PERRL, lids and conjunctivae normal ENMT: Mucous membranes are moist. Posterior pharynx clear of any exudate or lesions.Normal dentition.  Neck: normal, supple, no masses. Respiratory: clear to auscultation bilaterally, no wheezing, no crackles. Normal respiratory effort. No accessory muscle use.  Cardiovascular: Regular rate and rhythm, no murmurs / rubs / gallops. No extremity edema. 2+ pedal pulses. Abdomen: no tenderness, no masses palpated. Bowel sounds positive.  Musculoskeletal: no clubbing / cyanosis. No joint deformity upper and lower extremities. Good ROM, no contractures. Normal muscle tone.  Skin: no  rashes, lesions, ulcers. No induration Neurologic: Mild dysarthria otherwise CN 2-12 grossly intact. Sensation intact. Strength slightly weaker right upper and lower extremities but largely 5/5 in all 4 extremities.  Psychiatric: Normal judgment and insight. Alert and oriented x 3. Normal mood.   Labs on Admission: I have personally reviewed following labs and imaging studies  CBC: Recent Labs  Lab 01/17/21 1444  WBC 7.5  HGB 11.2*  HCT 33.4*  MCV 78.6*  PLT 263   Basic Metabolic Panel: Recent Labs  Lab 01/17/21 1444  NA 134*  K 3.8  CL 100  CO2 25  GLUCOSE 98  BUN 13  CREATININE 1.01  CALCIUM 9.3   GFR: Estimated Creatinine Clearance: 88.3 mL/min (by C-G formula based on SCr of 1.01 mg/dL). Liver Function Tests: Recent Labs  Lab 01/17/21 1444  AST 20  ALT 19  ALKPHOS 84  BILITOT 0.6  PROT 8.0  ALBUMIN 4.5   No results for input(s): LIPASE, AMYLASE in the last 168 hours. No results for input(s): AMMONIA in the last 168 hours. Coagulation Profile: Recent Labs  Lab 01/17/21 1444  INR 0.9   Cardiac Enzymes: No results for input(s): CKTOTAL, CKMB, CKMBINDEX, TROPONINI in the last 168 hours. BNP (last 3 results) No results for input(s): PROBNP in the last 8760 hours. HbA1C: No results for input(s): HGBA1C in the last 72 hours. CBG: No results for input(s): GLUCAP in the last 168 hours. Lipid Profile: No results for input(s): CHOL, HDL, LDLCALC, TRIG, CHOLHDL, LDLDIRECT in the last 72 hours. Thyroid Function Tests: No results for input(s): TSH, T4TOTAL, FREET4, T3FREE, THYROIDAB in the last 72 hours. Anemia Panel: No results for input(s): VITAMINB12, FOLATE, FERRITIN, TIBC, IRON, RETICCTPCT in the last 72 hours. Urine analysis:    Component Value Date/Time   COLORURINE STRAW (A) 12/13/2020 1245   APPEARANCEUR CLEAR 12/13/2020 1245   LABSPEC 1.019 12/13/2020 1245   PHURINE 7.0 12/13/2020 1245   GLUCOSEU NEGATIVE 12/13/2020 1245   HGBUR NEGATIVE  12/13/2020 1245   [place] 12/13/2020 1245   KETONESUR NEGATIVE 12/13/2020 1245   PROTEINUR NEGATIVE 12/13/2020 1245   NITRITE NEGATIVE 12/13/2020 1245   LEUKOCYTESUR NEGATIVE 12/13/2020 1245    Radiological Exams on Admission: No results found.  EKG: Personally reviewed. Sinus rhythm with PACs.  No prior for comparison.  Assessment/Plan Principal Problem:   GIB (gastrointestinal bleeding) Active Problems:   Essential hypertension   Hyperlipidemia   History of stroke   Dustin Golden is a 65 y.o. male with medical history significant for recent left ICA and left MCA/M2 stroke (thought vessel to vessel embolism from high-grade left ICA stenosis) s/p mechanical thrombectomy and rescue left ICA stenting, mild residual right-sided weakness and dysarthria, hypertension, hyperlipidemia, tobacco use who is admitted with reported GI bleeding.  GI bleed: Patient reports initial BRBPR followed by dark maroon appearing stool.  Per documentation, had melena on ED exam.  Hemoglobin currently stable at 11.2. -Hold aspirin and Brilinta -We will keep n.p.o. after midnight -Repeat CBC in a.m. -Will need GI consultation in a.m.  Recent left ICA and MCA/M2 stroke s/p left ICA mechanical thrombectomy followed by rescue stenting: Has mild residual right-sided weakness and dysarthria.  Hypertension: ***  Hyperlipidemia: ***  DVT prophylaxis: ***  Code Status: ***  Family Communication: ***  Disposition Plan: ***  Consults called: ***  Level of care: Med-Surg Admission status: ***   Status is: Observation  {Observation:23811}  Dispo: The patient is from: {From:23814}              Anticipated d/c is to: {To:23815}              Patient currently {Medically stable:23817}   Difficult to place patient {Yes/No:25151}  Zada Finders MD [facility]  If 7PM-7AM, please contact night-coverage [email or web address]  01/17/2021, 9:18 PM

## 2021-01-17 NOTE — ED Notes (Signed)
carelink called for hospitalitis consult

## 2021-01-17 NOTE — ED Triage Notes (Signed)
Emergency Medicine Provider Triage Evaluation Note  JET ARMBRUST , a 65 y.o. male  was evaluated in triage.  Pt complains of BRBPR  And dark maroon stool x1 week. On blood thinner. Started feeling weak and sob.  Review of Systems  Positive: recat bleeding  Negative: abd pain   Physical Exam  BP 109/84 (BP Location: Right Arm)   Pulse 99   Temp 98.4 F (36.9 C) (Oral)   Resp 18   Ht 6\' 3"  (1.905 m)   Wt 87.2 kg   SpO2 100%   BMI 24.03 kg/m  Gen:   Awake, no distress   HEENT:  Atraumatic  Resp:  Normal effort  Cardiac:  Normal rate  Abd:   Nondistended, nontender  MSK:   Moves extremities without difficulty  Neuro:  Speech clear   Medical Decision Making  Medically screening exam initiated at 2:05 PM.  Appropriate orders placed.  Kathe Mariner was informed that the remainder of the evaluation will be completed by another provider, this initial triage assessment does not replace that evaluation, and the importance of remaining in the ED until their evaluation is complete.  Clinical Impression  [facility]   Margarita Mail, PA-C 01/17/21 1406

## 2021-01-17 NOTE — ED Triage Notes (Signed)
Rectal bleeding x 4 days. States his stools are gray and purple.

## 2021-01-17 NOTE — Care Plan (Signed)
Transfer from [facility] for GIB Dr. Ralene Bathe  64yom presented to [facility] w/ rectal bleeding on ASA 81mg , Brilinta s/p CVA 12/2020. BRBPR x1 week.   Has melena on exam. Started on Protonix in ED  PMH includes  HTN  Exam: AF VSS Exam benign per EDP  Data  CMP unremarkable  Hgb 01.0  A/P GIB complicated by ASA, Brilinta. Stable hemodynamically in ED. Will need GI consult.  Notified by EDP of need for admission for GIB. [facility] accepts patient to medical bed at [facility]. EDP remains responsible for orders/medical decisions while patient is holding at [facility]. Upon arrival to [facility], [facility] will assume care. Nursing/Floor staff will call flow manager/carelink to notify them of patient's arrival so that the proper [facility] member may be contacted.   Murray Hodgkins, MD [facility]

## 2021-01-17 NOTE — H&P (Addendum)
History and Physical    Dustin Golden:[id number] DOB: [birth year] DOA: 01/17/2021  PCP: Patient, No Pcp Per (Inactive)  Patient coming from: Home via [place]  I have personally briefly reviewed patient's old medical records in [place]  Chief Complaint: BRBPR  HPI: Dustin Golden is a 65 y.o. male with medical history significant for recent left ICA and left MCA/M2 stroke (thought vessel to vessel embolism from high-grade left ICA stenosis) s/p mechanical thrombectomy and rescue left ICA stenting, mild residual right-sided weakness and dysarthria, hypertension, hyperlipidemia, tobacco use who presents to the ED for evaluation of BRBPR.  As above, patient recently admitted 12/13/2020-12/16/2020 for right-sided weakness and aphasia due to to left ICA and left MCA/M2 stroke.  He was found to have high-grade left ICA stenosis and underwent mechanical thrombectomy followed by rescue left ICA stenting.  He was started on aspirin and Brilinta and ultimately discharged to inpatient rehab.  Patient states he has been taking his medications regularly.  He said about 1 week ago he noticed an unusual smell to his stool.  He then noticed seeing bright red blood mixed in with the stool and over the next few days a change in color to a dark maroon appearance.  He says he is now getting lightheaded after having bowel movements but has not lost consciousness or fallen.  He says he felt so weak he came to the ED for further evaluation.  [facility] ED Course:  Initial vitals showed BP 109/84, pulse 99, RR 18, temp 98.4 F, SPO2 100% on room air.  Labs show WBC 7.5, hemoglobin 11.2, platelets 238,000, sodium 134, potassium 3.8, bicarb 25, BUN 13, creatinine 1.01, serum glucose 98, LFTs within normal limits, INR 0.9.  SARS-CoV-2 and influenza A/B PCR is negative.  Patient had reported melena on ED examination.  Patient was started on IV Protonix infusion.  The hospitalist service was  consulted to admit for further evaluation and management.  Review of Systems: All systems reviewed and are negative except as documented in history of present illness above.   Past Medical History:  Diagnosis Date  . Hypertension   . Stroke [facility])     Past Surgical History:  Procedure Laterality Date  . APPENDECTOMY    . gastric ulcer surgery    . IR CT HEAD LTD  12/13/2020  . IR INTRAVSC STENT CERV CAROTID W/EMB-PROT MOD SED INCL ANGIO  12/14/2020  . IR PERCUTANEOUS ART THROMBECTOMY/INFUSION INTRACRANIAL INC DIAG ANGIO  12/13/2020  . IR US GUIDE VASC ACCESS RIGHT  12/13/2020  . RADIOLOGY WITH ANESTHESIA N/A 12/13/2020   Procedure: IR WITH ANESTHESIA;  Surgeon: Luanne Bras, MD;  Location: [place];  Service: Radiology;  Laterality: N/A;    Social History:  reports that he has been smoking. He has a 96.00 pack-year smoking history. He has never used smokeless tobacco. He reports current alcohol use. He reports current drug use. Drug: Marijuana.  Allergies  Allergen Reactions  . Ibuprofen Other (See Comments)    Nosebleeds   . Sulfa Antibiotics Rash and Other (See Comments)    "Blisters to skin with cream"  . Sulfamethoxazole Rash and Other (See Comments)    "Blisters to skin with cream"    Family History  Problem Relation Age of Onset  . Hypertension Mother   . Hypertension Father      Prior to Admission medications   Medication Sig Start Date End Date Taking? Authorizing Provider  acetaminophen (TYLENOL)  325 MG tablet Take 2 tablets (650 mg total) by mouth every 4 (four) hours as needed for mild pain (or temp > 37.5 C (99.5 F)). 12/17/20   Bailey-Modzik, Delila A, NP  amLODipine (NORVASC) 10 MG tablet Take 1 tablet (10 mg total) by mouth daily. 01/05/21   Bayard Hugger, NP  aspirin 81 MG chewable tablet Chew 1 tablet (81 mg total) by mouth daily. 12/18/20   Bailey-Modzik, Delila A, NP  atorvastatin (LIPITOR) 20 MG tablet Take 1 tablet (20 mg total) by mouth daily. 01/05/21    Bayard Hugger, NP  docusate sodium (COLACE) 100 MG capsule Take 1 capsule (100 mg total) by mouth 2 (two) times daily as needed for mild constipation. 12/17/20   Bailey-Modzik, Delila A, NP  folic acid (FOLVITE) 1 MG tablet Take 1 tablet (1 mg total) by mouth daily. 12/27/20   Angiulli, Lavon Paganini, PA-C  lisinopril (ZESTRIL) 2.5 MG tablet Take 1 tablet (2.5 mg total) by mouth daily. 01/05/21   Bayard Hugger, NP  Multiple Vitamin (MULTIVITAMIN WITH MINERALS) TABS tablet Take 1 tablet by mouth daily. 12/18/20   Bailey-Modzik, Delila A, NP  nicotine (NICODERM CQ - DOSED IN MG/24 HR) 7 mg/24hr patch 7 mg patch daily x2 weeks and stop 12/27/20   Angiulli, Lavon Paganini, PA-C  polyethylene glycol (MIRALAX / GLYCOLAX) 17 g packet Take 17 g by mouth daily as needed for moderate constipation. 12/17/20   Bailey-Modzik, Delila A, NP  ticagrelor (BRILINTA) 90 MG TABS tablet Take 1 tablet (90 mg total) by mouth 2 (two) times daily. 12/27/20   Cathlyn Parsons, PA-C    Physical Exam: Vitals:   01/17/21 1600 01/17/21 1700 01/17/21 1905 01/17/21 2117  BP: 124/82 (!) 144/84 (!) 152/90 125/78  Pulse: 79 94 75 77  Resp: 20 16 15 18   Temp:   98.6 F (37 C) 98.1 F (36.7 C)  TempSrc:   Oral Oral  SpO2: 99% 99% 99% 99%  Weight:      Height:       Constitutional: Resting in bed, NAD, calm, comfortable Eyes: PERRL, lids and conjunctivae normal ENMT: Mucous membranes are moist. Posterior pharynx clear of any exudate or lesions.Normal dentition.  Neck: normal, supple, no masses. Respiratory: clear to auscultation bilaterally, no wheezing, no crackles. Normal respiratory effort. No accessory muscle use.  Cardiovascular: Regular rate and rhythm, no murmurs / rubs / gallops. No extremity edema. 2+ pedal pulses. Abdomen: no tenderness, no masses palpated. Bowel sounds positive.  Musculoskeletal: no clubbing / cyanosis. No joint deformity upper and lower extremities. Good ROM, no contractures. Normal muscle tone.  Skin: no  rashes, lesions, ulcers. No induration Neurologic: Mild dysarthria otherwise CN 2-12 grossly intact. Sensation intact. Strength slightly weaker right upper and lower extremities but largely 5/5 in all 4 extremities.  Psychiatric: Normal judgment and insight. Alert and oriented x 3. Normal mood.   Labs on Admission: I have personally reviewed following labs and imaging studies  CBC: Recent Labs  Lab 01/17/21 1444  WBC 7.5  HGB 11.2*  HCT 33.4*  MCV 78.6*  PLT 431   Basic Metabolic Panel: Recent Labs  Lab 01/17/21 1444  NA 134*  K 3.8  CL 100  CO2 25  GLUCOSE 98  BUN 13  CREATININE 1.01  CALCIUM 9.3   GFR: Estimated Creatinine Clearance: 88.3 mL/min (by C-G formula based on SCr of 1.01 mg/dL). Liver Function Tests: Recent Labs  Lab 01/17/21 1444  AST 20  ALT 19  ALKPHOS 84  BILITOT 0.6  PROT 8.0  ALBUMIN 4.5   No results for input(s): LIPASE, AMYLASE in the last 168 hours. No results for input(s): AMMONIA in the last 168 hours. Coagulation Profile: Recent Labs  Lab 01/17/21 1444  INR 0.9   Cardiac Enzymes: No results for input(s): CKTOTAL, CKMB, CKMBINDEX, TROPONINI in the last 168 hours. BNP (last 3 results) No results for input(s): PROBNP in the last 8760 hours. HbA1C: No results for input(s): HGBA1C in the last 72 hours. CBG: No results for input(s): GLUCAP in the last 168 hours. Lipid Profile: No results for input(s): CHOL, HDL, LDLCALC, TRIG, CHOLHDL, LDLDIRECT in the last 72 hours. Thyroid Function Tests: No results for input(s): TSH, T4TOTAL, FREET4, T3FREE, THYROIDAB in the last 72 hours. Anemia Panel: No results for input(s): VITAMINB12, FOLATE, FERRITIN, TIBC, IRON, RETICCTPCT in the last 72 hours. Urine analysis:    Component Value Date/Time   COLORURINE STRAW (A) 12/13/2020 1245   APPEARANCEUR CLEAR 12/13/2020 1245   LABSPEC 1.019 12/13/2020 1245   PHURINE 7.0 12/13/2020 1245   GLUCOSEU NEGATIVE 12/13/2020 1245   HGBUR NEGATIVE  12/13/2020 1245   [place] 12/13/2020 1245   KETONESUR NEGATIVE 12/13/2020 1245   PROTEINUR NEGATIVE 12/13/2020 1245   NITRITE NEGATIVE 12/13/2020 1245   LEUKOCYTESUR NEGATIVE 12/13/2020 1245    Radiological Exams on Admission: No results found.  EKG: Personally reviewed. Sinus rhythm with PACs.  No prior for comparison.  Assessment/Plan Principal Problem:   GIB (gastrointestinal bleeding) Active Problems:   Essential hypertension   Hyperlipidemia   History of stroke   Dustin Golden is a 65 y.o. male with medical history significant for recent left ICA and left MCA/M2 stroke (thought vessel to vessel embolism from high-grade left ICA stenosis) s/p mechanical thrombectomy and rescue left ICA stenting, mild residual right-sided weakness and dysarthria, hypertension, hyperlipidemia, tobacco use who is admitted with reported GI bleeding.  GI bleed: Patient reports initial BRBPR followed by dark maroon appearing stool.  Per documentation, had melena on ED exam.  Hemoglobin currently stable at 11.2.  He was started on continuous IV PPI infusion in the ED. -Hold aspirin and Brilinta -Change to IV Protonix twice daily -We will keep n.p.o. after midnight -Repeat CBC in a.m. -Will need GI consultation in a.m.  Recent left ICA and MCA/M2 stroke s/p left ICA mechanical thrombectomy followed by rescue stenting: Has mild residual right-sided weakness and dysarthria.  Aspirin and Brilinta on hold as above.  Hypertension: Resume home amlodipine and lisinopril.  Hyperlipidemia: Continue atorvastatin.  DVT prophylaxis: SCDs Code Status: Full code, confirmed with patient Family Communication: Discussed with patient, he has discussed with family Disposition Plan: From home and likely discharge to home pending clinical progress Consults called: None Level of care: Med-Surg Admission status:  Status is: Observation  The patient remains OBS appropriate and will d/c before 2  midnights.  Dispo: The patient is from: Home              Anticipated d/c is to: Home              Patient currently is not medically stable to d/c.   Difficult to place patient No  Zada Finders MD [facility]  If 7PM-7AM, please contact night-coverage [email or web address]  01/17/2021, 9:18 PM

## 2021-01-18 DIAGNOSIS — K922 Gastrointestinal hemorrhage, unspecified: Secondary | ICD-10-CM | POA: Diagnosis not present

## 2021-01-18 DIAGNOSIS — E785 Hyperlipidemia, unspecified: Secondary | ICD-10-CM

## 2021-01-18 DIAGNOSIS — I63512 Cerebral infarction due to unspecified occlusion or stenosis of left middle cerebral artery: Secondary | ICD-10-CM

## 2021-01-18 DIAGNOSIS — I10 Essential (primary) hypertension: Secondary | ICD-10-CM

## 2021-01-18 LAB — BASIC METABOLIC PANEL
Anion gap: 11 (ref 5–15)
BUN: 12 mg/dL (ref 8–23)
CO2: 24 mmol/L (ref 22–32)
Calcium: 9.4 mg/dL (ref 8.9–10.3)
Chloride: 104 mmol/L (ref 98–111)
Creatinine, Ser: 1.24 mg/dL (ref 0.61–1.24)
GFR, Estimated: >60 mL/min (ref >60)
Glucose, Bld: 100 mg/dL — ABNORMAL HIGH (ref 70–99)
Potassium: 3.9 mmol/L (ref 3.5–5.1)
Sodium: 139 mmol/L (ref 135–145)

## 2021-01-18 LAB — CBC
HCT: 33 % — ABNORMAL LOW (ref 39.0–52.0)
Hemoglobin: 10.8 g/dL — ABNORMAL LOW (ref 13.0–17.0)
MCH: 26.2 pg (ref 26.0–34.0)
MCHC: 32.7 g/dL (ref 30.0–36.0)
MCV: 79.9 fL — ABNORMAL LOW (ref 80.0–100.0)
Platelets: 220 10*3/uL (ref 150–400)
RBC: 4.13 MIL/uL — ABNORMAL LOW (ref 4.22–5.81)
RDW: 13.9 % (ref 11.5–15.5)
WBC: 7.2 10*3/uL (ref 4.0–10.5)
nRBC: 0 % (ref 0.0–0.2)

## 2021-01-18 MED ORDER — ATORVASTATIN CALCIUM 20 MG PO TABS
20.0000 mg | ORAL_TABLET | Freq: Every day | ORAL | Status: DC
  Administered 2021-01-18 – 2021-01-26 (×8): 20 mg via ORAL
  Filled 2021-01-18 (×8): qty 1

## 2021-01-18 MED ORDER — PANTOPRAZOLE SODIUM 40 MG IV SOLR
40.0000 mg | Freq: Two times a day (BID) | INTRAVENOUS | Status: DC
  Administered 2021-01-18 – 2021-01-21 (×8): 40 mg via INTRAVENOUS
  Filled 2021-01-18 (×8): qty 40

## 2021-01-18 MED ORDER — ASPIRIN 81 MG PO CHEW
81.0000 mg | CHEWABLE_TABLET | Freq: Every day | ORAL | Status: DC
  Administered 2021-01-18 – 2021-01-21 (×4): 81 mg via ORAL
  Filled 2021-01-18 (×4): qty 1

## 2021-01-18 MED ORDER — SUCRALFATE 1 GM/10ML PO SUSP
1.0000 g | Freq: Three times a day (TID) | ORAL | Status: AC
  Administered 2021-01-18 (×2): 1 g via ORAL
  Filled 2021-01-18 (×2): qty 10

## 2021-01-18 MED ORDER — LISINOPRIL 5 MG PO TABS
2.5000 mg | ORAL_TABLET | Freq: Every day | ORAL | Status: DC
  Administered 2021-01-18 – 2021-01-26 (×8): 2.5 mg via ORAL
  Filled 2021-01-18 (×8): qty 1

## 2021-01-18 MED ORDER — FENTANYL CITRATE (PF) 100 MCG/2ML IJ SOLN
25.0000 ug | INTRAMUSCULAR | Status: DC | PRN
  Administered 2021-01-18: 50 ug via INTRAVENOUS
  Filled 2021-01-18: qty 2

## 2021-01-18 MED ORDER — FOLIC ACID 1 MG PO TABS
1.0000 mg | ORAL_TABLET | Freq: Every day | ORAL | Status: DC
  Administered 2021-01-18 – 2021-01-26 (×8): 1 mg via ORAL
  Filled 2021-01-18 (×8): qty 1

## 2021-01-18 MED ORDER — AMLODIPINE BESYLATE 10 MG PO TABS
10.0000 mg | ORAL_TABLET | Freq: Every day | ORAL | Status: DC
  Administered 2021-01-18 – 2021-01-26 (×8): 10 mg via ORAL
  Filled 2021-01-18 (×8): qty 1

## 2021-01-18 NOTE — H&P (View-Only) (Signed)
Referring Provider: [facility] Primary Care Physician:  Patient, No Pcp Per (Inactive) Primary Gastroenterologist: None (unassigned).  May have seen Dr. Juanda Chance many years ago.  Reason for Consultation: Melena and anemia  HPI: Dustin Golden is a 65 y.o. male 1 month status post left CVA with residual right hemiparesis status post rehabilitation stay, who underwent mechanical thrombectomy and left ICA stenting, admitted through the emergency yesterday because of severe weakness in association with bloody and dark stools which had been going on for about a week.  No dyspeptic symptoms.  In the emergency room, his stool was noted to be melenic in character.  In retrospect, the patient thinks he may have been having similar stools even when he was at [facility] about a month ago.  The patient does have a history of prior ulcer disease, by his report.  In fact, he underwent surgery for an ulcer about 20 years ago in [facility] (by his report, it sounds like the ulcer was oversewn, and that no gastric resection was performed), and he subsequently underwent endoscopic evaluation (it sounds like it was done by Dr. Juanda Chance here in [place], records not available) and was treated with medication for about 30 days, and since that time had had no further problems until recently.  He would use occasional aspirin for a "hangover" but no nonsteroidal anti-inflammatory drugs. He does smoke fairly heavily.  At the time of his recent stroke, he was started on low-dose aspirin and Brilinta.  Those medications are on hold at present.  At this time, he is comfortable.  No chest pain, shortness of breath, or significant abdominal pain.  He just feels "hungry."    Past Medical History:  Diagnosis Date  . Hypertension   . Stroke [facility])     Past Surgical History:  Procedure Laterality Date  . APPENDECTOMY    . gastric ulcer surgery    . IR CT HEAD LTD  12/13/2020  . IR INTRAVSC STENT CERV CAROTID  W/EMB-PROT MOD SED INCL ANGIO  12/14/2020  . IR PERCUTANEOUS ART THROMBECTOMY/INFUSION INTRACRANIAL INC DIAG ANGIO  12/13/2020  . IR US GUIDE VASC ACCESS RIGHT  12/13/2020  . RADIOLOGY WITH ANESTHESIA N/A 12/13/2020   Procedure: IR WITH ANESTHESIA;  Surgeon: Luanne Bras, MD;  Location: [facility];  Service: Radiology;  Laterality: N/A;    Prior to Admission medications   Medication Sig Start Date End Date Taking? Authorizing Provider  acetaminophen (TYLENOL) 325 MG tablet Take 2 tablets (650 mg total) by mouth every 4 (four) hours as needed for mild pain (or temp > 37.5 C (99.5 F)). 12/17/20  Yes Bailey-Modzik, Delila A, NP  amLODipine (NORVASC) 10 MG tablet Take 1 tablet (10 mg total) by mouth daily. 01/05/21  Yes Bayard Hugger, NP  aspirin 81 MG chewable tablet Chew 1 tablet (81 mg total) by mouth daily. 12/18/20  Yes Bailey-Modzik, Delila A, NP  atorvastatin (LIPITOR) 20 MG tablet Take 1 tablet (20 mg total) by mouth daily. 01/05/21  Yes Bayard Hugger, NP  folic acid (FOLVITE) 1 MG tablet Take 1 tablet (1 mg total) by mouth daily. 12/27/20  Yes Angiulli, Lavon Paganini, PA-C  lisinopril (ZESTRIL) 2.5 MG tablet Take 1 tablet (2.5 mg total) by mouth daily. 01/05/21  Yes Bayard Hugger, NP  ticagrelor (BRILINTA) 90 MG TABS tablet Take 1 tablet (90 mg total) by mouth 2 (two) times daily. 12/27/20  Yes Angiulli, Lavon Paganini, PA-C    Current Facility-Administered Medications  Medication Dose Route Frequency Provider Last Rate Last Admin  . acetaminophen (TYLENOL) tablet 650 mg  650 mg Oral Q6H PRN Lenore Cordia, MD       Or  . acetaminophen (TYLENOL) suppository 650 mg  650 mg Rectal Q6H PRN Zada Finders R, MD      . amLODipine (NORVASC) tablet 10 mg  10 mg Oral Daily Lenore Cordia, MD   10 mg at 01/18/21 0953  . atorvastatin (LIPITOR) tablet 20 mg  20 mg Oral Daily Lenore Cordia, MD   20 mg at 01/18/21 0953  . fentaNYL (SUBLIMAZE) injection 25-50 mcg  25-50 mcg Intravenous Q2H PRN Vianne Bulls,  MD   50 mcg at 01/18/21 0237  . folic acid (FOLVITE) tablet 1 mg  1 mg Oral Daily Lenore Cordia, MD   1 mg at 01/18/21 0953  . lisinopril (ZESTRIL) tablet 2.5 mg  2.5 mg Oral Daily Zada Finders R, MD   2.5 mg at 01/18/21 0953  . ondansetron (ZOFRAN) tablet 4 mg  4 mg Oral Q6H PRN Lenore Cordia, MD       Or  . ondansetron (ZOFRAN) injection 4 mg  4 mg Intravenous Q6H PRN Zada Finders R, MD      . pantoprazole (PROTONIX) injection 40 mg  40 mg Intravenous Q12H Lenore Cordia, MD   40 mg at 01/18/21 7619    Allergies as of 01/17/2021 - Review Complete 01/17/2021  Allergen Reaction Noted  . Ibuprofen Other (See Comments) 04/21/2016  . Sulfa antibiotics Rash and Other (See Comments) 06/08/2015  . Sulfamethoxazole Rash and Other (See Comments) 05/12/2019    Family History  Problem Relation Age of Onset  . Hypertension Mother   . Hypertension Father     Social History   Socioeconomic History  . Marital status: Unknown    Spouse name: Not on file  . Number of children: Not on file  . Years of education: Not on file  . Highest education level: Not on file  Occupational History  . Not on file  Tobacco Use  . Smoking status: Current Every Day Smoker    Packs/day: 2.00    Years: 48.00    Pack years: 96.00  . Smokeless tobacco: Never Used  Vaping Use  . Vaping Use: Never used  Substance and Sexual Activity  . Alcohol use: Yes    Comment: weekly  . Drug use: Yes    Types: Marijuana  . Sexual activity: Yes  Other Topics Concern  . Not on file  Social History Narrative  . Not on file   Social Determinants of Health   Financial Resource Strain: Not on file  Food Insecurity: Not on file  Transportation Needs: Not on file  Physical Activity: Not on file  Stress: Not on file  Social Connections: Not on file  Intimate Partner Violence: Not on file    Review of Systems: See HPI  Physical Exam: Vital signs in last 24 hours: Temp:  [97.6 F (36.4 C)-98.6 F (37 C)]  98.2 F (36.8 C) (04/05 0950) Pulse Rate:  [72-99] 80 (04/05 0950) Resp:  [15-20] 20 (04/05 0950) BP: (109-152)/(71-90) 119/82 (04/05 0950) SpO2:  [99 %-100 %] 100 % (04/05 0950) Weight:  [87.2 kg] 87.2 kg (04/04 1352) Last BM Date: 01/17/21 General:   Alert,  Well-developed, well-nourished, pleasant and cooperative in NAD Head:  Normocephalic and atraumatic. Eyes:  Sclera clear, no icterus.   Conjunctiva pink. Lungs:  Clear throughout to  auscultation.   No wheezes, crackles, or rhonchi. No evident respiratory distress. Heart:   Regular rate and rhythm; no murmurs, clicks, rubs,  or gallops. Abdomen:  Soft, nontender, and nondistended.  Rectal: In ER, showed melenic stool.  Not repeated. Msk:   Symmetrical without gross deformities. Pulses:  Normal radial pulse is noted. Extremities:   Without edema. Neurologic: Slight facial droop and minimal drooling, slight dysarthria.  The patient indicates, however, he can walk a mile even since his stroke.  Slight right upper extremity spasticity. Skin:  Intact without significant lesions or rashes. Psych:   Alert and cooperative. Normal mood and affect.  Intake/Output from previous day: 04/04 0701 - 04/05 0700 In: 463.7 [P.O.:360; I.V.:103.7] Out: 700 [Urine:700] Intake/Output this shift: Total I/O In: 0  Out: 300 [Urine:300]  Lab Results: Recent Labs    01/17/21 1444 01/18/21 0506  WBC 7.5 7.2  HGB 11.2* 10.8*  HCT 33.4* 33.0*  PLT 238 220   BMET Recent Labs    01/17/21 1444 01/18/21 0506  NA 134* 139  K 3.8 3.9  CL 100 104  CO2 25 24  GLUCOSE 98 100*  BUN 13 12  CREATININE 1.01 1.24  CALCIUM 9.3 9.4   LFT Recent Labs    01/17/21 1444  PROT 8.0  ALBUMIN 4.5  AST 20  ALT 19  ALKPHOS 84  BILITOT 0.6   PT/INR Recent Labs    01/17/21 1444  LABPROT 12.2  INR 0.9    Studies/Results: No results found.  Impression: 1.  Melenic stool, recurrent 2.  Posthemorrhagic anemia, mild (hemoglobin is only about 1 g  lower than it was at time of hospital discharge a month ago).  Current BUN normal. 3.  Recent initiation of aspirin and Brilinta for CVA and cerebral artery stenting 4.  Prior history of ulcer disease with surgery (?  Oversew?)  20 years ago  Discussion: It would seem likely that the patient's use of aspirin over the past month or so has led to recurrent ulcer disease, with bleeding exacerbated by antiplatelet therapy with Brilinta.  However, it is also possible that this patient, in view of the relatively minor amount of bleeding, has just had mucosal oozing from hemorrhagic gastritis.  Plan: 1.  Endoscopic evaluation tomorrow (discussed with Dr. Paulita Fujita, who would like to wait 1 day before doing the procedure to let the antiplatelet therapy get out of his system just a little bit more).  I have placed orders for that procedure, and will allow the patient a heart healthy diet today.  I have reviewed the nature, purpose, alternatives (observation, upper GI series) and risks of the procedure with the patient, and with 2 family members at the bedside (I believe his wife and daughter).  I explained that although it is our preference to avoid the sedation for procedures for 6 months following a neurologic event, in this setting, I feel that the risk of anesthesia is still relatively low and is justified by the need to clarify the patient's diagnostic picture.  They understand and agree.  They also understand that, because of lingering antiplatelet effects, tomorrow's endoscopy will be primarily diagnostic in character, and if a significant intervention such as clipping of a visible vessel is needed, that would need to be deferred for several more days until the antiplatelet effect of the patient's recent medications has diminished. 2.  Continue empiric antipeptic therapy with IV Protonix in the meantime 3.  I will give a couple of doses of sucralfate  suspension as adjunctive antiulcer therapy, in case an ulcer  is present 4.  Consider consultation with neurology to see if they feel that a "bridge" with IV heparin is needed until antiplatelet therapy is able to be resumed.   LOS: 0 days   Youlanda Mighty Mekel [place]stock  01/18/2021, 10:04 AM   Pager [phone] If no answer or after 5 PM call [phone]

## 2021-01-18 NOTE — Progress Notes (Signed)
PROGRESS NOTE  Dustin Golden YYT:[id number] DOB: 16-Apr-1956 DOA: 01/17/2021 PCP: Patient, No Pcp Per (Inactive)  Brief History   65 year old man PMH recent left ICA and left MCA/M2 stroke (thought vessel to vessel embolism from high-grade left ICA stenosis) s/p mechanical thrombectomy and rescue left ICA stenting, mild residual right-sided weakness and dysarthria discharged on Brilinta and aspirin presented with bleeding from rectum.  Admitted for GI bleed.  A & P  GI bleed, maroon-colored stool. --Hemoglobin relatively stable with only 1 g change.  Hemodynamic stable.  Asymptomatic. --Discussed with Dr. Cristina Gong.  Continue PPI.  Plan for endoscopy 4/6. --Discussed with Dr. Erlinda Hong stroke service who has conferred with Dr. Karenann Cai in regard to the stent placed February 28.  Recommendation is to continue aspirin--this was cleared with Dr. Cristina Gong. --Trend hemoglobin, n.p.o. after midnight  Recent left ICA and MCA/M2 stroke s/p left ICA mechanical thrombectomy followed by rescue stenting with mild residual upper and lower extremity weakness and dysarthria. --Continue aspirin as above.  Resume Brilinta as soon as possible. --Appears to be recovering well from stroke late February.  Essential hypertension --Continue amlodipine and lisinopril  Hyperlipidemia --Continue atorvastatin     Disposition Plan:  Discussion:   Dispo: The patient is from: Home              Anticipated d/c is to: Home              Patient currently is not medically stable to d/c.   Difficult to place patient No  DVT prophylaxis: SCDs Start: 01/17/21 2046   Code Status: Full Code Level of care: Med-Surg Family Communication: mother and daughter at bedside  Murray Hodgkins, MD  [facility] Direct contact: see [email or web address] (further directions at bottom of note if needed) 7PM-7AM contact night coverage as at bottom of note 01/18/2021, 3:41 PM  LOS: 0 days   Significant Hospital Events   . 4/4 admit  for GIB   Consults:  . GI   Procedures:  .   Significant Diagnostic Tests:  . CXR NAD   Micro Data:  .    Antimicrobials:  .   Interval History/Subjective  CC: f/u bleeding  Stools have been nourished.  No pain.  Breathing fine.  Objective   Vitals:  Vitals:   01/18/21 0950 01/18/21 1328  BP: 119/82 124/73  Pulse: 80 85  Resp: 20 18  Temp: 98.2 F (36.8 C) 98.2 F (36.8 C)  SpO2: 100% 100%    Exam:  Constitutional:   . Appears calm and comfortable ENMT:  . grossly normal hearing  Respiratory:  . CTA bilaterally, no w/r/r.  . Respiratory effort normal.  Cardiovascular:  . RRR, no m/r/g . No LE extremity edema   Abdomen:  . soft Psychiatric:  . Mental status o Mood, affect appropriate  I have personally reviewed the following:   Today's Data  . Basic metabolic panel unremarkable . CBC stable  Scheduled Meds: . amLODipine  10 mg Oral Daily  . aspirin  81 mg Oral Daily  . atorvastatin  20 mg Oral Daily  . folic acid  1 mg Oral Daily  . lisinopril  2.5 mg Oral Daily  . pantoprazole (PROTONIX) IV  40 mg Intravenous Q12H  . sucralfate  1 g Oral TID WC & HS   Continuous Infusions:  Principal Problem:   GIB (gastrointestinal bleeding) Active Problems:   Left middle cerebral artery stroke [facility])   Essential hypertension   Hyperlipidemia   LOS: 0  days   How to contact the [facility] Attending or Consulting provider [place] or covering provider during after hours [place], for this patient?  1. Check the care team in [facility] and look for a) attending/consulting [facility] provider listed and b) the [facility] team listed 2. Log into [email or web address] and use [place]'s universal password to access. If you do not have the password, please contact the hospital operator. 3. Locate the [facility] provider you are looking for under [facility] and page to a number that you can be directly reached. 4. If you still have difficulty reaching the provider, please page the [facility]  (Director on Call) for the Hospitalists listed on amion for assistance.

## 2021-01-18 NOTE — Anesthesia Preprocedure Evaluation (Addendum)
Anesthesia Evaluation  Patient identified by MRN, date of birth, ID band Patient awake    Reviewed: Allergy & Precautions, NPO status , Patient's Chart, lab work & pertinent test results  Airway Mallampati: I  TM Distance: >3 FB Neck ROM: Full    Dental  (+) Teeth Intact, Dental Advisory Given, Missing,    Pulmonary former smoker,  Quit smoking 11/2020 when he had CVA, 38 pack year history  No inhalers   Pulmonary exam normal breath sounds clear to auscultation       Cardiovascular hypertension, Pt. on medications Normal cardiovascular exam Rhythm:Regular Rate:Normal     Neuro/Psych CVA 12/13/20- significant residual R sided weakness CVA (11/2020- on brillinta (last dose 01/17/21)), Residual Symptoms negative psych ROS   GI/Hepatic PUD, (+)     substance abuse  marijuana use, GIB   Endo/Other  negative endocrine ROS  Renal/GU Renal InsufficiencyRenal diseaseCr 1.24  negative genitourinary   Musculoskeletal negative musculoskeletal ROS (+)   Abdominal   Peds  Hematology  (+) Blood dyscrasia, anemia , H/H 10.4/31.6   Anesthesia Other Findings   Reproductive/Obstetrics negative OB ROS                         Anesthesia Physical Anesthesia Plan  ASA: III  Anesthesia Plan: MAC   Post-op Pain Management:    Induction:   PONV Risk Score and Plan: Propofol infusion, TIVA and Treatment may vary due to age or medical condition  Airway Management Planned: Natural Airway and Simple Face Mask  Additional Equipment: None  Intra-op Plan:   Post-operative Plan:   Informed Consent: I have reviewed the patients History and Physical, chart, labs and discussed the procedure including the risks, benefits and alternatives for the proposed anesthesia with the patient or authorized representative who has indicated his/her understanding and acceptance.     Dental advisory given  Plan Discussed with:  CRNA  Anesthesia Plan Comments:        Anesthesia Quick Evaluation

## 2021-01-18 NOTE — Consult Note (Signed)
Referring Provider: [facility] Primary Care Physician:  Patient, No Pcp Per (Inactive) Primary Gastroenterologist: None (unassigned).  May have seen Dr. Juanda Chance many years ago.  Reason for Consultation: Melena and anemia  HPI: Dustin Golden is a 65 y.o. male 1 month status post left CVA with residual right hemiparesis status post rehabilitation stay, who underwent mechanical thrombectomy and left ICA stenting, admitted through the emergency yesterday because of severe weakness in association with bloody and dark stools which had been going on for about a week.  No dyspeptic symptoms.  In the emergency room, his stool was noted to be melenic in character.  In retrospect, the patient thinks he may have been having similar stools even when he was at [facility] about a month ago.  The patient does have a history of prior ulcer disease, by his report.  In fact, he underwent surgery for an ulcer about 20 years ago in [facility] (by his report, it sounds like the ulcer was oversewn, and that no gastric resection was performed), and he subsequently underwent endoscopic evaluation (it sounds like it was done by Dr. Juanda Chance here in [place], records not available) and was treated with medication for about 30 days, and since that time had had no further problems until recently.  He would use occasional aspirin for a "hangover" but no nonsteroidal anti-inflammatory drugs. He does smoke fairly heavily.  At the time of his recent stroke, he was started on low-dose aspirin and Brilinta.  Those medications are on hold at present.  At this time, he is comfortable.  No chest pain, shortness of breath, or significant abdominal pain.  He just feels "hungry."    Past Medical History:  Diagnosis Date  . Hypertension   . Stroke [facility])     Past Surgical History:  Procedure Laterality Date  . APPENDECTOMY    . gastric ulcer surgery    . IR CT HEAD LTD  12/13/2020  . IR INTRAVSC STENT CERV CAROTID  W/EMB-PROT MOD SED INCL ANGIO  12/14/2020  . IR PERCUTANEOUS ART THROMBECTOMY/INFUSION INTRACRANIAL INC DIAG ANGIO  12/13/2020  . IR US GUIDE VASC ACCESS RIGHT  12/13/2020  . RADIOLOGY WITH ANESTHESIA N/A 12/13/2020   Procedure: IR WITH ANESTHESIA;  Surgeon: Luanne Bras, MD;  Location: [place];  Service: Radiology;  Laterality: N/A;    Prior to Admission medications   Medication Sig Start Date End Date Taking? Authorizing Provider  acetaminophen (TYLENOL) 325 MG tablet Take 2 tablets (650 mg total) by mouth every 4 (four) hours as needed for mild pain (or temp > 37.5 C (99.5 F)). 12/17/20  Yes Bailey-Modzik, Delila A, NP  amLODipine (NORVASC) 10 MG tablet Take 1 tablet (10 mg total) by mouth daily. 01/05/21  Yes Bayard Hugger, NP  aspirin 81 MG chewable tablet Chew 1 tablet (81 mg total) by mouth daily. 12/18/20  Yes Bailey-Modzik, Delila A, NP  atorvastatin (LIPITOR) 20 MG tablet Take 1 tablet (20 mg total) by mouth daily. 01/05/21  Yes Bayard Hugger, NP  folic acid (FOLVITE) 1 MG tablet Take 1 tablet (1 mg total) by mouth daily. 12/27/20  Yes Angiulli, Lavon Paganini, PA-C  lisinopril (ZESTRIL) 2.5 MG tablet Take 1 tablet (2.5 mg total) by mouth daily. 01/05/21  Yes Bayard Hugger, NP  ticagrelor (BRILINTA) 90 MG TABS tablet Take 1 tablet (90 mg total) by mouth 2 (two) times daily. 12/27/20  Yes Angiulli, Lavon Paganini, PA-C    Current Facility-Administered Medications  Medication Dose Route Frequency Provider Last Rate Last Admin  . acetaminophen (TYLENOL) tablet 650 mg  650 mg Oral Q6H PRN Lenore Cordia, MD       Or  . acetaminophen (TYLENOL) suppository 650 mg  650 mg Rectal Q6H PRN Zada Finders R, MD      . amLODipine (NORVASC) tablet 10 mg  10 mg Oral Daily Lenore Cordia, MD   10 mg at 01/18/21 0953  . atorvastatin (LIPITOR) tablet 20 mg  20 mg Oral Daily Lenore Cordia, MD   20 mg at 01/18/21 0953  . fentaNYL (SUBLIMAZE) injection 25-50 mcg  25-50 mcg Intravenous Q2H PRN Vianne Bulls,  MD   50 mcg at 01/18/21 0237  . folic acid (FOLVITE) tablet 1 mg  1 mg Oral Daily Lenore Cordia, MD   1 mg at 01/18/21 0953  . lisinopril (ZESTRIL) tablet 2.5 mg  2.5 mg Oral Daily Zada Finders R, MD   2.5 mg at 01/18/21 0953  . ondansetron (ZOFRAN) tablet 4 mg  4 mg Oral Q6H PRN Lenore Cordia, MD       Or  . ondansetron (ZOFRAN) injection 4 mg  4 mg Intravenous Q6H PRN Zada Finders R, MD      . pantoprazole (PROTONIX) injection 40 mg  40 mg Intravenous Q12H Lenore Cordia, MD   40 mg at 01/18/21 6213    Allergies as of 01/17/2021 - Review Complete 01/17/2021  Allergen Reaction Noted  . Ibuprofen Other (See Comments) 04/21/2016  . Sulfa antibiotics Rash and Other (See Comments) 06/08/2015  . Sulfamethoxazole Rash and Other (See Comments) 05/12/2019    Family History  Problem Relation Age of Onset  . Hypertension Mother   . Hypertension Father     Social History   Socioeconomic History  . Marital status: Unknown    Spouse name: Not on file  . Number of children: Not on file  . Years of education: Not on file  . Highest education level: Not on file  Occupational History  . Not on file  Tobacco Use  . Smoking status: Current Every Day Smoker    Packs/day: 2.00    Years: 48.00    Pack years: 96.00  . Smokeless tobacco: Never Used  Vaping Use  . Vaping Use: Never used  Substance and Sexual Activity  . Alcohol use: Yes    Comment: weekly  . Drug use: Yes    Types: Marijuana  . Sexual activity: Yes  Other Topics Concern  . Not on file  Social History Narrative  . Not on file   Social Determinants of Health   Financial Resource Strain: Not on file  Food Insecurity: Not on file  Transportation Needs: Not on file  Physical Activity: Not on file  Stress: Not on file  Social Connections: Not on file  Intimate Partner Violence: Not on file    Review of Systems: See HPI  Physical Exam: Vital signs in last 24 hours: Temp:  [97.6 F (36.4 C)-98.6 F (37 C)]  98.2 F (36.8 C) (04/05 0950) Pulse Rate:  [72-99] 80 (04/05 0950) Resp:  [15-20] 20 (04/05 0950) BP: (109-152)/(71-90) 119/82 (04/05 0950) SpO2:  [99 %-100 %] 100 % (04/05 0950) Weight:  [87.2 kg] 87.2 kg (04/04 1352) Last BM Date: 01/17/21 General:   Alert,  Well-developed, well-nourished, pleasant and cooperative in NAD Head:  Normocephalic and atraumatic. Eyes:  Sclera clear, no icterus.   Conjunctiva pink. Lungs:  Clear throughout to  auscultation.   No wheezes, crackles, or rhonchi. No evident respiratory distress. Heart:   Regular rate and rhythm; no murmurs, clicks, rubs,  or gallops. Abdomen:  Soft, nontender, and nondistended.  Rectal: In ER, showed melenic stool.  Not repeated. Msk:   Symmetrical without gross deformities. Pulses:  Normal radial pulse is noted. Extremities:   Without edema. Neurologic: Slight facial droop and minimal drooling, slight dysarthria.  The patient indicates, however, he can walk a mile even since his stroke.  Slight right upper extremity spasticity. Skin:  Intact without significant lesions or rashes. Psych:   Alert and cooperative. Normal mood and affect.  Intake/Output from previous day: 04/04 0701 - 04/05 0700 In: 463.7 [P.O.:360; I.V.:103.7] Out: 700 [Urine:700] Intake/Output this shift: Total I/O In: 0  Out: 300 [Urine:300]  Lab Results: Recent Labs    01/17/21 1444 01/18/21 0506  WBC 7.5 7.2  HGB 11.2* 10.8*  HCT 33.4* 33.0*  PLT 238 220   BMET Recent Labs    01/17/21 1444 01/18/21 0506  NA 134* 139  K 3.8 3.9  CL 100 104  CO2 25 24  GLUCOSE 98 100*  BUN 13 12  CREATININE 1.01 1.24  CALCIUM 9.3 9.4   LFT Recent Labs    01/17/21 1444  PROT 8.0  ALBUMIN 4.5  AST 20  ALT 19  ALKPHOS 84  BILITOT 0.6   PT/INR Recent Labs    01/17/21 1444  LABPROT 12.2  INR 0.9    Studies/Results: No results found.  Impression: 1.  Melenic stool, recurrent 2.  Posthemorrhagic anemia, mild (hemoglobin is only about 1 g  lower than it was at time of hospital discharge a month ago).  Current BUN normal. 3.  Recent initiation of aspirin and Brilinta for CVA and cerebral artery stenting 4.  Prior history of ulcer disease with surgery (?  Oversew?)  20 years ago  Discussion: It would seem likely that the patient's use of aspirin over the past month or so has led to recurrent ulcer disease, with bleeding exacerbated by antiplatelet therapy with Brilinta.  However, it is also possible that this patient, in view of the relatively minor amount of bleeding, has just had mucosal oozing from hemorrhagic gastritis.  Plan: 1.  Endoscopic evaluation tomorrow (discussed with Dr. Paulita Fujita, who would like to wait 1 day before doing the procedure to let the antiplatelet therapy get out of his system just a little bit more).  I have placed orders for that procedure, and will allow the patient a heart healthy diet today.  I have reviewed the nature, purpose, alternatives (observation, upper GI series) and risks of the procedure with the patient, and with 2 family members at the bedside (I believe his wife and daughter).  I explained that although it is our preference to avoid the sedation for procedures for 6 months following a neurologic event, in this setting, I feel that the risk of anesthesia is still relatively low and is justified by the need to clarify the patient's diagnostic picture.  They understand and agree.  They also understand that, because of lingering antiplatelet effects, tomorrow's endoscopy will be primarily diagnostic in character, and if a significant intervention such as clipping of a visible vessel is needed, that would need to be deferred for several more days until the antiplatelet effect of the patient's recent medications has diminished. 2.  Continue empiric antipeptic therapy with IV Protonix in the meantime 3.  I will give a couple of doses of sucralfate  suspension as adjunctive antiulcer therapy, in case an ulcer  is present 4.  Consider consultation with neurology to see if they feel that a "bridge" with IV heparin is needed until antiplatelet therapy is able to be resumed.   LOS: 0 days   Youlanda Mighty Jaslene Marsteller  01/18/2021, 10:04 AM   Pager [phone] If no answer or after 5 PM call [phone]

## 2021-01-18 NOTE — Hospital Course (Signed)
65 year old man PMH recent left ICA and left MCA/M2 stroke (thought vessel to vessel embolism from high-grade left ICA stenosis) s/p mechanical thrombectomy and rescue left ICA stenting, mild residual right-sided weakness and dysarthria discharged on Brilinta and aspirin presented with bleeding from rectum.  Admitted for GI bleed.  A & P  GI bleed, maroon-colored stool. --Hemoglobin relatively stable with only 1 g change.  Hemodynamic stable.  Asymptomatic. --Discussed with Dr. Cristina Gong.  Continue PPI.  Plan for endoscopy 4/6. --Discussed with Dr. Erlinda Hong stroke service who has conferred with Dr. Karenann Cai in regard to the stent placed February 28.  Recommendation is to continue aspirin--this was cleared with Dr. Cristina Gong. --Trend hemoglobin, n.p.o. after midnight  Recent left ICA and MCA/M2 stroke s/p left ICA mechanical thrombectomy followed by rescue stenting with mild residual upper and lower extremity weakness and dysarthria. --Continue aspirin as above.  Resume Brilinta as soon as possible. --Appears to be recovering well from stroke late February.  Essential hypertension --Continue amlodipine and lisinopril  Hyperlipidemia --Continue atorvastatin

## 2021-01-19 ENCOUNTER — Encounter (HOSPITAL_COMMUNITY)
Admission: EM | Disposition: A | Discharge status: Home / Self Care | Payer: Self-pay | Source: Home / Self Care | Attending: Student

## 2021-01-19 ENCOUNTER — Inpatient Hospital Stay (HOSPITAL_COMMUNITY): Payer: Commercial Managed Care - PPO | Admitting: Anesthesiology

## 2021-01-19 ENCOUNTER — Encounter (HOSPITAL_COMMUNITY): Payer: Self-pay | Admitting: Internal Medicine

## 2021-01-19 DIAGNOSIS — I6612 Occlusion and stenosis of left anterior cerebral artery: Secondary | ICD-10-CM | POA: Diagnosis present

## 2021-01-19 DIAGNOSIS — C182 Malignant neoplasm of ascending colon: Secondary | ICD-10-CM | POA: Diagnosis not present

## 2021-01-19 DIAGNOSIS — F1210 Cannabis abuse, uncomplicated: Secondary | ICD-10-CM | POA: Diagnosis present

## 2021-01-19 DIAGNOSIS — E785 Hyperlipidemia, unspecified: Secondary | ICD-10-CM | POA: Diagnosis present

## 2021-01-19 DIAGNOSIS — Z20822 Contact with and (suspected) exposure to covid-19: Secondary | ICD-10-CM | POA: Diagnosis present

## 2021-01-19 DIAGNOSIS — K922 Gastrointestinal hemorrhage, unspecified: Secondary | ICD-10-CM | POA: Diagnosis present

## 2021-01-19 DIAGNOSIS — R159 Full incontinence of feces: Secondary | ICD-10-CM | POA: Diagnosis not present

## 2021-01-19 DIAGNOSIS — K921 Melena: Secondary | ICD-10-CM | POA: Diagnosis not present

## 2021-01-19 DIAGNOSIS — I10 Essential (primary) hypertension: Secondary | ICD-10-CM | POA: Diagnosis present

## 2021-01-19 DIAGNOSIS — I959 Hypotension, unspecified: Secondary | ICD-10-CM | POA: Diagnosis not present

## 2021-01-19 DIAGNOSIS — K3189 Other diseases of stomach and duodenum: Secondary | ICD-10-CM | POA: Diagnosis present

## 2021-01-19 DIAGNOSIS — N179 Acute kidney failure, unspecified: Secondary | ICD-10-CM | POA: Diagnosis not present

## 2021-01-19 DIAGNOSIS — F1020 Alcohol dependence, uncomplicated: Secondary | ICD-10-CM | POA: Diagnosis present

## 2021-01-19 DIAGNOSIS — I63512 Cerebral infarction due to unspecified occlusion or stenosis of left middle cerebral artery: Secondary | ICD-10-CM | POA: Diagnosis not present

## 2021-01-19 DIAGNOSIS — F1410 Cocaine abuse, uncomplicated: Secondary | ICD-10-CM | POA: Diagnosis present

## 2021-01-19 DIAGNOSIS — K449 Diaphragmatic hernia without obstruction or gangrene: Secondary | ICD-10-CM | POA: Diagnosis present

## 2021-01-19 DIAGNOSIS — I69351 Hemiplegia and hemiparesis following cerebral infarction affecting right dominant side: Secondary | ICD-10-CM | POA: Diagnosis not present

## 2021-01-19 DIAGNOSIS — K9189 Other postprocedural complications and disorders of digestive system: Secondary | ICD-10-CM | POA: Diagnosis not present

## 2021-01-19 DIAGNOSIS — I6522 Occlusion and stenosis of left carotid artery: Secondary | ICD-10-CM | POA: Diagnosis present

## 2021-01-19 DIAGNOSIS — D62 Acute posthemorrhagic anemia: Secondary | ICD-10-CM | POA: Diagnosis present

## 2021-01-19 DIAGNOSIS — Z5331 Laparoscopic surgical procedure converted to open procedure: Secondary | ICD-10-CM | POA: Diagnosis not present

## 2021-01-19 DIAGNOSIS — C180 Malignant neoplasm of cecum: Secondary | ICD-10-CM | POA: Diagnosis present

## 2021-01-19 DIAGNOSIS — E876 Hypokalemia: Secondary | ICD-10-CM | POA: Diagnosis not present

## 2021-01-19 DIAGNOSIS — K5669 Other partial intestinal obstruction: Secondary | ICD-10-CM | POA: Diagnosis present

## 2021-01-19 DIAGNOSIS — C189 Malignant neoplasm of colon, unspecified: Secondary | ICD-10-CM | POA: Diagnosis not present

## 2021-01-19 DIAGNOSIS — E871 Hypo-osmolality and hyponatremia: Secondary | ICD-10-CM | POA: Diagnosis not present

## 2021-01-19 DIAGNOSIS — I69322 Dysarthria following cerebral infarction: Secondary | ICD-10-CM | POA: Diagnosis not present

## 2021-01-19 DIAGNOSIS — R066 Hiccough: Secondary | ICD-10-CM | POA: Diagnosis not present

## 2021-01-19 DIAGNOSIS — K567 Ileus, unspecified: Secondary | ICD-10-CM | POA: Diagnosis not present

## 2021-01-19 HISTORY — PX: ESOPHAGOGASTRODUODENOSCOPY: SHX5428

## 2021-01-19 HISTORY — DX: Gastrointestinal hemorrhage, unspecified: K92.2

## 2021-01-19 LAB — CBC
HCT: 31.6 % — ABNORMAL LOW (ref 39.0–52.0)
Hemoglobin: 10.4 g/dL — ABNORMAL LOW (ref 13.0–17.0)
MCH: 26.3 pg (ref 26.0–34.0)
MCHC: 32.9 g/dL (ref 30.0–36.0)
MCV: 80 fL (ref 80.0–100.0)
Platelets: 211 10*3/uL (ref 150–400)
RBC: 3.95 MIL/uL — ABNORMAL LOW (ref 4.22–5.81)
RDW: 13.8 % (ref 11.5–15.5)
WBC: 5.6 10*3/uL (ref 4.0–10.5)
nRBC: 0 % (ref 0.0–0.2)

## 2021-01-19 SURGERY — EGD (ESOPHAGOGASTRODUODENOSCOPY)
Anesthesia: Monitor Anesthesia Care

## 2021-01-19 MED ORDER — PROPOFOL 10 MG/ML IV BOLUS
INTRAVENOUS | Status: DC | PRN
  Administered 2021-01-19 (×3): 40 mg via INTRAVENOUS

## 2021-01-19 MED ORDER — PROPOFOL 500 MG/50ML IV EMUL
INTRAVENOUS | Status: AC
  Filled 2021-01-19: qty 50

## 2021-01-19 MED ORDER — LACTATED RINGERS IV SOLN: INTRAVENOUS | Status: DC

## 2021-01-19 MED ORDER — PEG-KCL-NACL-NASULF-NA ASC-C 100 G PO SOLR
0.5000 | ORAL | Status: AC
  Administered 2021-01-19 – 2021-01-20 (×2): 100 g via ORAL
  Filled 2021-01-19: qty 1

## 2021-01-19 MED ORDER — LIDOCAINE 2% (20 MG/ML) 5 ML SYRINGE
INTRAMUSCULAR | Status: DC | PRN
  Administered 2021-01-19: 80 mg via INTRAVENOUS

## 2021-01-19 MED ORDER — PROPOFOL 500 MG/50ML IV EMUL
INTRAVENOUS | Status: DC | PRN
  Administered 2021-01-19: 150 ug/kg/min via INTRAVENOUS

## 2021-01-19 MED ORDER — EPHEDRINE SULFATE-NACL 50-0.9 MG/10ML-% IV SOSY
PREFILLED_SYRINGE | INTRAVENOUS | Status: DC | PRN
  Administered 2021-01-19: 10 mg via INTRAVENOUS
  Administered 2021-01-19: 5 mg via INTRAVENOUS

## 2021-01-19 MED ORDER — PHENYLEPHRINE 40 MCG/ML (10ML) SYRINGE FOR IV PUSH (FOR BLOOD PRESSURE SUPPORT)
PREFILLED_SYRINGE | INTRAVENOUS | Status: DC | PRN
  Administered 2021-01-19: 160 ug via INTRAVENOUS
  Administered 2021-01-19: 200 ug via INTRAVENOUS
  Administered 2021-01-19: 120 ug via INTRAVENOUS
  Administered 2021-01-19: 80 ug via INTRAVENOUS
  Administered 2021-01-19: 200 ug via INTRAVENOUS
  Administered 2021-01-19: 160 ug via INTRAVENOUS

## 2021-01-19 NOTE — Interval H&P Note (Signed)
History and Physical Interval Note:  01/19/2021 9:50 AM  Dustin Golden  has presented today for surgery, with the diagnosis of Melena and anemia.  The various methods of treatment have been discussed with the patient and family. After consideration of risks, benefits and other options for treatment, the patient has consented to  Procedure(s): ESOPHAGOGASTRODUODENOSCOPY (EGD) (N/A) as a surgical intervention.  The patient's history has been reviewed, patient examined, no change in status, stable for surgery.  I have reviewed the patient's chart and labs.  Questions were answered to the patient's satisfaction.     Landry Dyke

## 2021-01-19 NOTE — Progress Notes (Signed)
[facility] PROGRESS NOTE    Progress Note  Dustin Golden  ZTI:[id number] DOB: 11-29-1955 DOA: 01/17/2021 PCP: Patient, No Pcp Per (Inactive)     Brief Narrative:   Dustin Golden is an 65 y.o. male of recent left ICA and left ICA stent small stroke (thought vessel to vessel embolism from a high-grade left ACA stenosis) status post mechanical thrombectomy and rescued left ICA stenting with mid residual right-sided weakness and this dysarthria upon discharge on aspirin and Brilinta presenting for bright red blood per rectum.  Significant studies: Endoscopy on 01/19/2021  Antibiotics: None  Microbiology data: Blood culture:  Procedures: None  Assessment/Plan:   Acute GI bleed: From 12.5-10.5 currently asymptomatic. Discussed with Dr. Cristina Gong scheduled for EGD on 01/19/2021. Discussed with interventional radiology recommended to continue aspirin discussed with Dr. Janeece Riggers who agreed.  Recent left ACA and MCA/M2 stroke status post left ICA mechanical thrombectomy followed by rescue stenting with mid residual upper and lower extremity weakness and dysarthria Continue aspirin GI has agreed, resume Brilinta as soon as GIs agrees with it.  Essential hypertension: Continue amlodipine and lisinopril.  Hyperlipidemia: Continue statins.  DVT prophylaxis: SCD Family Communication:none Status is: Observation  The patient will require care spanning > 2 midnights and should be moved to inpatient because: Hemodynamically unstable  Dispo: The patient is from: Home              Anticipated d/c is to: Home              Patient currently is not medically stable to d/c.   Difficult to place patient No        Code Status:     Code Status Orders  (From admission, onward)         Start     Ordered   01/17/21 2046  Full code  Continuous        01/17/21 2046        Code Status History    Date Active Date Inactive Code Status Order ID Comments User Context   12/17/2020 1603  12/28/2020 1700 Full Code [id number]  Elizabeth Sauer Inpatient   12/17/2020 1603 12/17/2020 1603 Full Code [id number]  Elizabeth Sauer Inpatient   12/13/2020 0559 12/17/2020 1524 Full Code [id number]  Lorenza Chick, MD Inpatient   12/13/2020 0559 12/13/2020 0559 Full Code [id number]  Lorenza Chick, MD Inpatient   Advance Care Planning Activity        IV Access:    Peripheral IV   Procedures and diagnostic studies:   No results found.   Medical Consultants:    None.   Subjective:    Dustin Golden denies any abdominal pain no bloody bowel movements overnight.  Objective:    Vitals:   01/18/21 0950 01/18/21 1328 01/18/21 2059 01/19/21 0505  BP: 119/82 124/73 117/79 118/73  Pulse: 80 85 80 74  Resp: 20 18 16 17   Temp: 98.2 F (36.8 C) 98.2 F (36.8 C) 97.6 F (36.4 C) 98.1 F (36.7 C)  TempSrc: Oral Oral Oral Oral  SpO2: 100% 100% 100% 98%  Weight:      Height:       SpO2: 98 % O2 Flow Rate (L/min): 0 L/min   Intake/Output Summary (Last 24 hours) at 01/19/2021 0805 Last data filed at 01/19/2021 0710 Gross per 24 hour  Intake 720 ml  Output 1502 ml  Net -782 ml   Filed Weights   01/17/21 1352  Weight: 87.2  kg    Exam: General exam: In no acute distress. Respiratory system: Good air movement and clear to auscultation. Cardiovascular system: S1 & S2 heard, RRR. No JVD. Gastrointestinal system: Abdomen is nondistended, soft and nontender.  Extremities: No pedal edema. Skin: No rashes, lesions or ulcers Psychiatry: Judgement and insight appear normal. Mood & affect appropriate.    Data Reviewed:    Labs: Basic Metabolic Panel: Recent Labs  Lab 01/17/21 1444 01/18/21 0506  NA 134* 139  K 3.8 3.9  CL 100 104  CO2 25 24  GLUCOSE 98 100*  BUN 13 12  CREATININE 1.01 1.24  CALCIUM 9.3 9.4   GFR Estimated Creatinine Clearance: 71.9 mL/min (by C-G formula based on SCr of 1.24 mg/dL). Liver Function Tests: Recent Labs  Lab  01/17/21 1444  AST 20  ALT 19  ALKPHOS 84  BILITOT 0.6  PROT 8.0  ALBUMIN 4.5   No results for input(s): LIPASE, AMYLASE in the last 168 hours. No results for input(s): AMMONIA in the last 168 hours. Coagulation profile Recent Labs  Lab 01/17/21 1444  INR 0.9   COVID-19 Labs  No results for input(s): DDIMER, FERRITIN, LDH, CRP in the last 72 hours.  Lab Results  Component Value Date   SARSCOV2NAA NEGATIVE 01/17/2021   [place] NEGATIVE 12/13/2020    CBC: Recent Labs  Lab 01/17/21 1444 01/18/21 0506 01/19/21 0415  WBC 7.5 7.2 5.6  HGB 11.2* 10.8* 10.4*  HCT 33.4* 33.0* 31.6*  MCV 78.6* 79.9* 80.0  PLT 238 220 211   Cardiac Enzymes: No results for input(s): CKTOTAL, CKMB, CKMBINDEX, TROPONINI in the last 168 hours. BNP (last 3 results) No results for input(s): PROBNP in the last 8760 hours. CBG: No results for input(s): GLUCAP in the last 168 hours. D-Dimer: No results for input(s): DDIMER in the last 72 hours. Hgb A1c: No results for input(s): HGBA1C in the last 72 hours. Lipid Profile: No results for input(s): CHOL, HDL, LDLCALC, TRIG, CHOLHDL, LDLDIRECT in the last 72 hours. Thyroid function studies: No results for input(s): TSH, T4TOTAL, T3FREE, THYROIDAB in the last 72 hours.  Invalid input(s): FREET3 Anemia work up: No results for input(s): VITAMINB12, FOLATE, FERRITIN, TIBC, IRON, RETICCTPCT in the last 72 hours. Sepsis Labs: Recent Labs  Lab 01/17/21 1444 01/18/21 0506 01/19/21 0415  WBC 7.5 7.2 5.6   Microbiology Recent Results (from the past 240 hour(s))  Resp Panel by RT-PCR (Flu A&B, Covid) Nasopharyngeal Swab     Status: None   Collection Time: 01/17/21  3:47 PM   Specimen: Nasopharyngeal Swab; Nasopharyngeal(NP) swabs in vial transport medium  Result Value Ref Range Status   SARS Coronavirus 2 by RT PCR NEGATIVE NEGATIVE Final    Comment: (NOTE) SARS-CoV-2 target nucleic acids are NOT DETECTED.  The SARS-CoV-2 RNA is generally  detectable in upper respiratory specimens during the acute phase of infection. The lowest concentration of SARS-CoV-2 viral copies this assay can detect is 138 copies/mL. A negative result does not preclude SARS-Cov-2 infection and should not be used as the sole basis for treatment or other patient management decisions. A negative result may occur with  improper specimen collection/handling, submission of specimen other than nasopharyngeal swab, presence of viral mutation(s) within the areas targeted by this assay, and inadequate number of viral copies(<138 copies/mL). A negative result must be combined with clinical observations, patient history, and epidemiological information. The expected result is Negative.  Fact Sheet for Patients:  EntrepreneurPulse.com.au  Fact Sheet for Healthcare Providers:  IncredibleEmployment.be  This test is no t yet approved or cleared by the [place] and  has been authorized for detection and/or diagnosis of SARS-CoV-2 by FDA under an Emergency Use Authorization (EUA). This EUA will remain  in effect (meaning this test can be used) for the duration of the COVID-19 declaration under Section 564(b)(1) of the Act, 21 U.S.C.section 360bbb-3(b)(1), unless the authorization is terminated  or revoked sooner.       Influenza A by PCR NEGATIVE NEGATIVE Final   Influenza B by PCR NEGATIVE NEGATIVE Final    Comment: (NOTE) The Xpert Xpress SARS-CoV-2/FLU/RSV plus assay is intended as an aid in the diagnosis of influenza from Nasopharyngeal swab specimens and should not be used as a sole basis for treatment. Nasal washings and aspirates are unacceptable for Xpert Xpress SARS-CoV-2/FLU/RSV testing.  Fact Sheet for Patients: EntrepreneurPulse.com.au  Fact Sheet for Healthcare Providers: IncredibleEmployment.be  This test is not yet approved or cleared by the [place] FDA  and has been authorized for detection and/or diagnosis of SARS-CoV-2 by FDA under an Emergency Use Authorization (EUA). This EUA will remain in effect (meaning this test can be used) for the duration of the COVID-19 declaration under Section 564(b)(1) of the Act, 21 U.S.C. section 360bbb-3(b)(1), unless the authorization is terminated or revoked.  Performed at [facility], [address] [place], [place] [address]      Medications:   . amLODipine  10 mg Oral Daily  . aspirin  81 mg Oral Daily  . atorvastatin  20 mg Oral Daily  . folic acid  1 mg Oral Daily  . lisinopril  2.5 mg Oral Daily  . pantoprazole (PROTONIX) IV  40 mg Intravenous Q12H   Continuous Infusions:    LOS: 0 days   Charlynne Cousins  [facility]  01/19/2021, 8:05 AM

## 2021-01-19 NOTE — Op Note (Signed)
[facility] Patient Name: Dustin Golden Procedure Date: 01/19/2021 MRN: [id number] Attending MD: Arta Silence , MD Date of Birth: 09-Sep-1956 CSN: [id number] Age: 65 Admit Type: Outpatient Procedure:                Upper GI endoscopy Indications:              Acute post hemorrhagic anemia, Melena Providers:                Arta Silence, MD, Cleda Daub, RN, Fransico Setters                            Mbumina, Technician Referring MD:             [facility] Medicines:                Monitored Anesthesia Care Complications:            No immediate complications. Estimated Blood Loss:     Estimated blood loss: none. Procedure:                Pre-Anesthesia Assessment:                           - Prior to the procedure, a History and Physical                            was performed, and patient medications and                            allergies were reviewed. The patient's tolerance of                            previous anesthesia was also reviewed. The risks                            and benefits of the procedure and the sedation                            options and risks were discussed with the patient.                            All questions were answered, and informed consent                            was obtained. Prior Anticoagulants: The patient has                            taken Brilinta, last dose was 2 days prior to                            procedure. ASA Grade Assessment: III - A patient                            with severe systemic disease. After reviewing the  risks and benefits, the patient was deemed in                            satisfactory condition to undergo the procedure.                           After obtaining informed consent, the endoscope was                            passed under direct vision. Throughout the                            procedure, the patient's blood pressure, pulse, and                             oxygen saturations were monitored continuously. The                            GIF-H190 ([id number]) was introduced through the                            mouth, and advanced to the second part of duodenum.                            The upper GI endoscopy was accomplished without                            difficulty. The patient tolerated the procedure                            well. Scope In: Scope Out: Findings:      A small hiatal hernia was present.      The exam of the esophagus was otherwise normal.      The entire examined stomach was normal.      Patchy mildly erythematous mucosa was found in the duodenal bulb.      The exam of the duodenum was otherwise normal.      No old or fresh blood was seen to the extent of our examination. Impression:               - Small hiatal hernia.                           - Normal stomach.                           - Erythematous duodenopathy.                           - No source of GI bleeding identified. Moderate Sedation:      Not Applicable - Patient had care per Anesthesia. Recommendation:           - Clear liquid diet today.                           - Perform a colonoscopy tomorrow. Procedure Code(s):        ---  Professional ---                           [phone], Esophagogastroduodenoscopy, flexible,                            transoral; diagnostic, including collection of                            specimen(s) by brushing or washing, when performed                            (separate procedure) Diagnosis Code(s):        --- Professional ---                           K44.9, Diaphragmatic hernia without obstruction or                            gangrene                           K31.89, Other diseases of stomach and duodenum                           D62, Acute posthemorrhagic anemia                           K92.1, Melena (includes Hematochezia) CPT copyright 2019 [employer]. All rights reserved. The codes  documented in this report are preliminary and upon coder review may  be revised to meet current compliance requirements. Arta Silence, MD 01/19/2021 10:22:53 AM This report has been signed electronically. Number of Addenda: 0

## 2021-01-19 NOTE — Transfer of Care (Signed)
Immediate Anesthesia Transfer of Care Note  Patient: Dustin Golden  Procedure(s) Performed: ESOPHAGOGASTRODUODENOSCOPY (EGD) (N/A )  Patient Location: PACU and Endoscopy Unit  Anesthesia Type:MAC  Level of Consciousness: awake, alert  and oriented  Airway & Oxygen Therapy: Patient Spontanous Breathing and Patient connected to face mask  Post-op Assessment: Report given to RN and Post -op Vital signs reviewed and stable  Post vital signs: Reviewed and stable  Last Vitals:  Vitals Value Taken Time  BP 119/62 01/19/21 1022  Temp    Pulse 76 01/19/21 1024  Resp 15 01/19/21 1024  SpO2 100 % 01/19/21 1024  Vitals shown include unvalidated device data.  Last Pain:  Vitals:   01/19/21 1022  TempSrc:   PainSc: Asleep      Patients Stated Pain Goal: 3 (48/18/59 0931)  Complications: No complications documented.

## 2021-01-19 NOTE — Anesthesia Postprocedure Evaluation (Signed)
Anesthesia Post Note  Patient: Dustin Golden  Procedure(s) Performed: ESOPHAGOGASTRODUODENOSCOPY (EGD) (N/A )     Patient location during evaluation: PACU Anesthesia Type: MAC Level of consciousness: awake and alert Pain management: pain level controlled Vital Signs Assessment: post-procedure vital signs reviewed and stable Respiratory status: spontaneous breathing, nonlabored ventilation and respiratory function stable Cardiovascular status: blood pressure returned to baseline and stable Postop Assessment: no apparent nausea or vomiting Anesthetic complications: no   No complications documented.  Last Vitals:  Vitals:   01/19/21 1030 01/19/21 1036  BP: (!) 110/59 118/72  Pulse: 86 91  Resp: 18 15  Temp:    SpO2: 100% 100%    Last Pain:  Vitals:   01/19/21 1036  TempSrc:   PainSc: 0-No pain                 Pervis Hocking

## 2021-01-19 NOTE — Anesthesia Preprocedure Evaluation (Addendum)
Anesthesia Evaluation  Patient identified by MRN, date of birth, ID band Patient awake    Reviewed: Allergy & Precautions, H&P , NPO status , Patient's Chart, lab work & pertinent test results  Airway Mallampati: II  TM Distance: >3 FB Neck ROM: Full    Dental no notable dental hx. (+) Teeth Intact, Dental Advisory Given   Pulmonary former smoker,    Pulmonary exam normal breath sounds clear to auscultation       Cardiovascular hypertension, Pt. on medications Normal cardiovascular exam Rhythm:Regular Rate:Normal  12/13/20 Echo  1. Left ventricular ejection fraction, by estimation, is 35 to 40%. The  left ventricle has moderately decreased function. The left ventricle  demonstrates global hypokinesis. There is moderate concentric left  ventricular hypertrophy. Left ventricular  diastolic parameters are consistent with Grade I diastolic dysfunction  (impaired relaxation).  2. Right ventricular systolic function is mildly reduced. The right  ventricular size is mildly enlarged. There is normal pulmonary artery  systolic pressure. The estimated right ventricular systolic pressure is  39.7 mmHg.    Neuro/Psych R sided weakness CVA, Residual Symptoms    GI/Hepatic negative GI ROS, Neg liver ROS,   Endo/Other    Renal/GU Renal disease     Musculoskeletal   Abdominal   Peds  Hematology  (+) anemia , Lab Results      Component                Value               Date                      WBC                      5.6                 01/19/2021                HGB                      10.4 (L)            01/19/2021                HCT                      31.6 (L)            01/19/2021                MCV                      80.0                01/19/2021                PLT                      211                 01/19/2021              Anesthesia Other Findings   Reproductive/Obstetrics                             Anesthesia Physical Anesthesia Plan  ASA: IV  Anesthesia Plan: MAC   Post-op Pain Management:  Induction:   PONV Risk Score and Plan: Treatment may vary due to age or medical condition  Airway Management Planned: Natural Airway  Additional Equipment: None  Intra-op Plan:   Post-operative Plan:   Informed Consent: I have reviewed the patients History and Physical, chart, labs and discussed the procedure including the risks, benefits and alternatives for the proposed anesthesia with the patient or authorized representative who has indicated his/her understanding and acceptance.     Dental advisory given  Plan Discussed with: CRNA  Anesthesia Plan Comments: (Melena anemia for colonoscopy)       Anesthesia Quick Evaluation

## 2021-01-20 ENCOUNTER — Inpatient Hospital Stay (HOSPITAL_COMMUNITY): Payer: Commercial Managed Care - PPO

## 2021-01-20 ENCOUNTER — Inpatient Hospital Stay (HOSPITAL_COMMUNITY): Payer: Commercial Managed Care - PPO | Admitting: Anesthesiology

## 2021-01-20 ENCOUNTER — Encounter (HOSPITAL_COMMUNITY)
Admission: EM | Disposition: A | Discharge status: Home / Self Care | Payer: Self-pay | Source: Home / Self Care | Attending: Student

## 2021-01-20 ENCOUNTER — Encounter (HOSPITAL_COMMUNITY): Payer: Self-pay | Admitting: Gastroenterology

## 2021-01-20 DIAGNOSIS — I10 Essential (primary) hypertension: Secondary | ICD-10-CM | POA: Diagnosis not present

## 2021-01-20 DIAGNOSIS — Z8673 Personal history of transient ischemic attack (TIA), and cerebral infarction without residual deficits: Secondary | ICD-10-CM

## 2021-01-20 DIAGNOSIS — K922 Gastrointestinal hemorrhage, unspecified: Secondary | ICD-10-CM | POA: Diagnosis not present

## 2021-01-20 DIAGNOSIS — I63512 Cerebral infarction due to unspecified occlusion or stenosis of left middle cerebral artery: Secondary | ICD-10-CM | POA: Diagnosis not present

## 2021-01-20 HISTORY — PX: COLONOSCOPY WITH PROPOFOL: SHX5780

## 2021-01-20 HISTORY — PX: BIOPSY: SHX5522

## 2021-01-20 SURGERY — COLONOSCOPY WITH PROPOFOL
Anesthesia: Monitor Anesthesia Care

## 2021-01-20 SURGERY — COLONOSCOPY WITH PROPOFOL
Anesthesia: Monitor Anesthesia Care | Laterality: Left

## 2021-01-20 MED ORDER — SODIUM CHLORIDE 0.9 % IV SOLN: INTRAVENOUS | Status: DC

## 2021-01-20 MED ORDER — QUETIAPINE FUMARATE 25 MG PO TABS: 25.0000 mg | ORAL_TABLET | Freq: Every evening | ORAL | Status: DC | PRN

## 2021-01-20 MED ORDER — IOHEXOL 300 MG/ML  SOLN
100.0000 mL | Freq: Once | INTRAMUSCULAR | Status: AC | PRN
  Administered 2021-01-20: 100 mL via INTRAVENOUS

## 2021-01-20 MED ORDER — ALBUMIN HUMAN 5 % IV SOLN
INTRAVENOUS | Status: AC
  Filled 2021-01-20: qty 250

## 2021-01-20 MED ORDER — QUETIAPINE FUMARATE 25 MG PO TABS
25.0000 mg | ORAL_TABLET | Freq: Two times a day (BID) | ORAL | Status: DC
  Administered 2021-01-20 – 2021-01-21 (×3): 25 mg via ORAL
  Filled 2021-01-20 (×3): qty 1

## 2021-01-20 MED ORDER — PHENYLEPHRINE HCL (PRESSORS) 10 MG/ML IV SOLN
INTRAVENOUS | Status: DC | PRN
  Administered 2021-01-20 (×2): 120 ug via INTRAVENOUS
  Administered 2021-01-20: 40 ug via INTRAVENOUS
  Administered 2021-01-20: 120 ug via INTRAVENOUS

## 2021-01-20 MED ORDER — ALPRAZOLAM 0.5 MG PO TABS
0.5000 mg | ORAL_TABLET | Freq: Once | ORAL | Status: AC
  Administered 2021-01-20: 0.5 mg via ORAL
  Filled 2021-01-20: qty 1

## 2021-01-20 MED ORDER — IOHEXOL 9 MG/ML PO SOLN
500.0000 mL | ORAL | Status: AC
  Administered 2021-01-20 (×2): 500 mL via ORAL

## 2021-01-20 MED ORDER — PROPOFOL 500 MG/50ML IV EMUL
INTRAVENOUS | Status: DC | PRN
  Administered 2021-01-20: 200 ug/kg/min via INTRAVENOUS

## 2021-01-20 MED ORDER — LACTATED RINGERS IV SOLN: INTRAVENOUS | Status: DC | PRN

## 2021-01-20 MED ORDER — ALBUMIN HUMAN 5 % IV SOLN: INTRAVENOUS | Status: DC | PRN

## 2021-01-20 MED ORDER — HALOPERIDOL LACTATE 5 MG/ML IJ SOLN: 1.0000 mg | Freq: Four times a day (QID) | INTRAMUSCULAR | Status: DC | PRN

## 2021-01-20 SURGICAL SUPPLY — 22 items

## 2021-01-20 NOTE — Interval H&P Note (Signed)
History and Physical Interval Note:  01/20/2021 12:07 PM  Dustin Golden  has presented today for surgery, with the diagnosis of melena, anemia, negative endoscopy.  The various methods of treatment have been discussed with the patient and family. After consideration of risks, benefits and other options for treatment, the patient has consented to  Procedure(s): COLONOSCOPY WITH PROPOFOL (Left) as a surgical intervention.  The patient's history has been reviewed, patient examined, no change in status, stable for surgery.  I have reviewed the patient's chart and labs.  Questions were answered to the patient's satisfaction.     Landry Dyke

## 2021-01-20 NOTE — Progress Notes (Signed)
Called by hospitalist Dr. Aileen Fass regarding antiplatelets/anticoagulation. Patient going in for colonoscopy with biopsy for a possible colonic mass which was etiology of GI bleed. Case was prior discussed with Dr. Erlinda Hong, Dr. Alonza Smoker and Dr. Tommie Sams recent left ICA stenting. According to the interventionalists, it is ideal to have some antiplatelet on board for at least 6 weeks to prevent stent thrombosis. But in his case, he requires a procedure urgently and emergently. He should be on a heparin drip-better than being on nothing and then antiplatelets resumed as soon as able postsurgically. Discussed my plan again in detail with Dr. Olevia Bowens Please call with further questions.  -- Amie Portland, MD Neurologist [facility] Pager: [phone]

## 2021-01-20 NOTE — Op Note (Signed)
[facility] Patient Name: Dustin Golden Procedure Date: 01/20/2021 MRN: [id number] Attending MD: Arta Silence , MD Date of Birth: October 06, 1956 CSN: [id number] Age: 65 Admit Type: Inpatient Procedure:                Colonoscopy Indications:              This is the patient's first colonoscopy,                            Hematochezia, Melena, Acute post hemorrhagic anemia Providers:                Arta Silence, MD, Cameron, Carmie End,                            RN, Cletis Athens, Technician, Courtney Heys. [place],                            CRNA Referring MD:             [facility] Medicines:                Monitored Anesthesia Care Complications:            No immediate complications. Estimated Blood Loss:     Estimated blood loss was minimal. Procedure:                Pre-Anesthesia Assessment:                           - Prior to the procedure, a History and Physical                            was performed, and patient medications and                            allergies were reviewed. The patient's tolerance of                            previous anesthesia was also reviewed. The risks                            and benefits of the procedure and the sedation                            options and risks were discussed with the patient.                            All questions were answered, and informed consent                            was obtained. Prior Anticoagulants: The patient has                            taken Brilinta, last dose was 2 days prior to  procedure. ASA Grade Assessment: IV - A patient                            with severe systemic disease that is a constant                            threat to life. After reviewing the risks and                            benefits, the patient was deemed in satisfactory                            condition to undergo the procedure.                           After  obtaining informed consent, the colonoscope                            was passed under direct vision. Throughout the                            procedure, the patient's blood pressure, pulse, and                            oxygen saturations were monitored continuously. The                            [id number]-H190DL ([id number]) Olympus pediatric colonscope                            was introduced through the anus and advanced to the                            the cecum, identified by appendiceal orifice and                            ileocecal valve. The ileocecal valve, appendiceal                            orifice, and rectum were photographed. The entire                            colon was examined. The colonoscopy was performed                            without difficulty. The patient tolerated the                            procedure well. The quality of the bowel                            preparation was good. Scope In: 12:34:21 PM Scope Out: 12:48:45 PM Scope Withdrawal Time: 0 hours 9 minutes 6 seconds  Total Procedure Duration: 0  hours 14 minutes 24 seconds  Findings:      The perianal and digital rectal examinations were normal.      A few medium-mouthed diverticula were found in the sigmoid colon,       descending colon, transverse colon and ascending colon.      A 10 mm polyp was found in the proximal ascending colon. The polyp was       sessile. Not biopsied or removed (will be in upcoming surgical field).      A frond-like/villous, fungating, infiltrative and ulcerated partially       obstructing large mass was found in the cecum. The mass was       circumferential. Oozing was present. This was biopsied with a cold       forceps for histology.      Colon otherwise normal; no other polyps, masses, vascular ectasias, or       inflammatory changes were seen.      The retroflexed view of the distal rectum and anal verge was normal and       showed no anal or rectal  abnormalities. Impression:               - Diverticulosis in the sigmoid colon, in the                            descending colon, in the transverse colon and in                            the ascending colon.                           - One 10 mm polyp in the proximal ascending colon                            (neither biopsied or removed, as it will be in                            upcoming surgical field).                           - Rule out malignancy, partially obstructing tumor                            in the cecum. Biopsied.                           - The distal rectum and anal verge are normal on                            retroflexion view.                           - The examination was otherwise normal. Moderate Sedation:      Not Applicable - Patient had care per Anesthesia. Recommendation:           - Return patient to hospital ward for ongoing care.                           -  Clear liquid diet today.                           - Continue present medications.                           - Await pathology results (sent "stat").                           - Perform a CT scan (computed tomography) of chest                            with contrast, abdomen with contrast and pelvis                            with contrast today.                           - I have spoken with Dr. Harlow Asa ([facility]), who will see                            patient tomorrow.                           - Given patient's recent stroke and cerebral stent,                            patient will need to stay in hospital with bridging                            therapy with goal for surgery on his colon mass                            prior to discharge from hospital.                           - Case discussed with Dr. Olevia Bowens [facility]).                           - [facility] will follow along at a distance. Procedure Code(s):        --- Professional ---                           [phone], Colonoscopy, flexible; with  biopsy, single                            or multiple Diagnosis Code(s):        --- Professional ---                           K63.5, Polyp of colon                           D49.0, Neoplasm of unspecified behavior of  digestive system                           K56.690, Other partial intestinal obstruction                           K92.1, Melena (includes Hematochezia)                           D62, Acute posthemorrhagic anemia                           K57.30, Diverticulosis of large intestine without                            perforation or abscess without bleeding CPT copyright 2019 American [employer] Association. All rights reserved. The codes documented in this report are preliminary and upon coder review may  be revised to meet current compliance requirements. Arta Silence, MD 01/20/2021 1:09:32 PM This report has been signed electronically. Number of Addenda: 0

## 2021-01-20 NOTE — Anesthesia Postprocedure Evaluation (Signed)
Anesthesia Post Note  Patient: Dustin Golden  Procedure(s) Performed: COLONOSCOPY WITH PROPOFOL (Left ) BIOPSY     Patient location during evaluation: Endoscopy Anesthesia Type: MAC Level of consciousness: awake and alert Pain management: pain level controlled Vital Signs Assessment: post-procedure vital signs reviewed and stable Respiratory status: spontaneous breathing, nonlabored ventilation, respiratory function stable and patient connected to nasal cannula oxygen Cardiovascular status: blood pressure returned to baseline and stable Postop Assessment: no apparent nausea or vomiting Anesthetic complications: no   No complications documented.  Last Vitals:  Vitals:   01/20/21 1330 01/20/21 1340  BP: (!) 130/92 (!) 145/76  Pulse: 79 66  Resp: 15 (!) 24  Temp:    SpO2: 97% 97%    Last Pain:  Vitals:   01/20/21 1340  TempSrc:   PainSc: 0-No pain                 Barnet Glasgow

## 2021-01-20 NOTE — Anesthesia Procedure Notes (Signed)
Procedure Name: MAC Date/Time: 01/20/2021 12:21 PM Performed by: Lissa Morales, CRNA Pre-anesthesia Checklist: Patient identified, Emergency Drugs available, Suction available and Patient being monitored Patient Re-evaluated:Patient Re-evaluated prior to induction Oxygen Delivery Method: Simple face mask Preoxygenation: Pre-oxygenation with 100% oxygen Placement Confirmation: positive ETCO2

## 2021-01-20 NOTE — Progress Notes (Signed)
[facility] PROGRESS NOTE    Progress Note  JASSIEL FLYE  TFT:[id number] DOB: Apr 20, 1956 DOA: 01/17/2021 PCP: Patient, No Pcp Per (Inactive)     Brief Narrative:   ROSALIE GELPI is an 65 y.o. male of recent left ICA and left ICA stent small stroke (thought vessel to vessel embolism from a high-grade left ACA stenosis) status post mechanical thrombectomy and rescued left ICA stenting with mid residual right-sided weakness and this dysarthria upon discharge on aspirin and Brilinta presenting for bright red blood per rectum.  Significant studies: Endoscopy on 01/19/2021 showed mild erythematous mucosa in the duodenum mild hiatal hernia no other acute findings.  Antibiotics: None  Microbiology data: Blood culture:  Procedures: None  Assessment/Plan:   Acute GI bleed: Currently asymptomatic hemoglobin stable. EGD on 01/19/2021 show mild edematous erythematous mucosa. For colonoscopy 12/21/2018 GI to dictate when to start Brilinta.  Recent left ACA and MCA/M2 stroke status post left ICA mechanical thrombectomy followed by rescue stenting with mid residual upper and lower extremity weakness and dysarthria: Continue aspirin GI has agreed, resume Brilinta as soon as GIs agrees with it.  Essential hypertension: Continue amlodipine and lisinopril.  Hyperlipidemia: Continue statins.  DVT prophylaxis: SCD Family Communication:none Status is: Inpatient The patient will require care spanning > 2 midnights and should be moved to inpatient because: Hemodynamically unstable  Dispo: The patient is from: Home              Anticipated d/c is to: Home              Patient currently is not medically stable to d/c.   Difficult to place patient No        Code Status:     Code Status Orders  (From admission, onward)         Start     Ordered   01/17/21 2046  Full code  Continuous        01/17/21 2046        Code Status History    Date Active Date Inactive Code Status  Order ID Comments User Context   12/17/2020 1603 12/28/2020 1700 Full Code [id number]  Elizabeth Sauer Inpatient   12/17/2020 1603 12/17/2020 1603 Full Code [id number]  Elizabeth Sauer Inpatient   12/13/2020 0559 12/17/2020 1524 Full Code [id number]  Lorenza Chick, MD Inpatient   12/13/2020 0559 12/13/2020 0559 Full Code [id number]  Lorenza Chick, MD Inpatient   Advance Care Planning Activity        IV Access:    Peripheral IV   Procedures and diagnostic studies:   No results found.   Medical Consultants:    None.   Subjective:    Kathe Mariner denies any abdominal pain wants to go home, not under good mood today.  Objective:    Vitals:   01/19/21 1030 01/19/21 1036 01/19/21 2016 01/20/21 0535  BP: (!) 110/59 118/72 118/79 132/88  Pulse: 86 91 85 79  Resp: 18 15 16 16   Temp:   98.2 F (36.8 C) 98.6 F (37 C)  TempSrc:   Oral Oral  SpO2: 100% 100% 98% 99%  Weight:      Height:       SpO2: 99 % O2 Flow Rate (L/min): 4 L/min   Intake/Output Summary (Last 24 hours) at 01/20/2021 0842 Last data filed at 01/20/2021 0551 Gross per 24 hour  Intake 1260 ml  Output 975 ml  Net 285 ml   [employer]  01/17/21 1352  Weight: 87.2 kg    Exam: General exam: In no acute distress. Respiratory system: Good air movement and clear to auscultation. Cardiovascular system: S1 & S2 heard, RRR. No JVD. Gastrointestinal system: Abdomen is nondistended, soft and nontender.  Extremities: No pedal edema. Skin: No rashes, lesions or ulcers Psychiatry: Judgement and insight appear normal. Mood & affect appropriate.   Data Reviewed:    Labs: Basic Metabolic Panel: Recent Labs  Lab 01/17/21 1444 01/18/21 0506  NA 134* 139  K 3.8 3.9  CL 100 104  CO2 25 24  GLUCOSE 98 100*  BUN 13 12  CREATININE 1.01 1.24  CALCIUM 9.3 9.4   GFR Estimated Creatinine Clearance: 71.9 mL/min (by C-G formula based on SCr of 1.24 mg/dL). Liver Function Tests: Recent Labs   Lab 01/17/21 1444  AST 20  ALT 19  ALKPHOS 84  BILITOT 0.6  PROT 8.0  ALBUMIN 4.5   No results for input(s): LIPASE, AMYLASE in the last 168 hours. No results for input(s): AMMONIA in the last 168 hours. Coagulation profile Recent Labs  Lab 01/17/21 1444  INR 0.9   COVID-19 Labs  No results for input(s): DDIMER, FERRITIN, LDH, CRP in the last 72 hours.  Lab Results  Component Value Date   SARSCOV2NAA NEGATIVE 01/17/2021   [place] NEGATIVE 12/13/2020    CBC: Recent Labs  Lab 01/17/21 1444 01/18/21 0506 01/19/21 0415  WBC 7.5 7.2 5.6  HGB 11.2* 10.8* 10.4*  HCT 33.4* 33.0* 31.6*  MCV 78.6* 79.9* 80.0  PLT 238 220 211   Cardiac Enzymes: No results for input(s): CKTOTAL, CKMB, CKMBINDEX, TROPONINI in the last 168 hours. BNP (last 3 results) No results for input(s): PROBNP in the last 8760 hours. CBG: No results for input(s): GLUCAP in the last 168 hours. D-Dimer: No results for input(s): DDIMER in the last 72 hours. Hgb A1c: No results for input(s): HGBA1C in the last 72 hours. Lipid Profile: No results for input(s): CHOL, HDL, LDLCALC, TRIG, CHOLHDL, LDLDIRECT in the last 72 hours. Thyroid function studies: No results for input(s): TSH, T4TOTAL, T3FREE, THYROIDAB in the last 72 hours.  Invalid input(s): FREET3 Anemia work up: No results for input(s): VITAMINB12, FOLATE, FERRITIN, TIBC, IRON, RETICCTPCT in the last 72 hours. Sepsis Labs: Recent Labs  Lab 01/17/21 1444 01/18/21 0506 01/19/21 0415  WBC 7.5 7.2 5.6   Microbiology Recent Results (from the past 240 hour(s))  Resp Panel by RT-PCR (Flu A&B, Covid) Nasopharyngeal Swab     Status: None   Collection Time: 01/17/21  3:47 PM   Specimen: Nasopharyngeal Swab; Nasopharyngeal(NP) swabs in vial transport medium  Result Value Ref Range Status   SARS Coronavirus 2 by RT PCR NEGATIVE NEGATIVE Final    Comment: (NOTE) SARS-CoV-2 target nucleic acids are NOT DETECTED.  The SARS-CoV-2 RNA is  generally detectable in upper respiratory specimens during the acute phase of infection. The lowest concentration of SARS-CoV-2 viral copies this assay can detect is 138 copies/mL. A negative result does not preclude SARS-Cov-2 infection and should not be used as the sole basis for treatment or other patient management decisions. A negative result may occur with  improper specimen collection/handling, submission of specimen other than nasopharyngeal swab, presence of viral mutation(s) within the areas targeted by this assay, and inadequate number of viral copies(<138 copies/mL). A negative result must be combined with clinical observations, patient history, and epidemiological information. The expected result is Negative.  Fact Sheet for Patients:  EntrepreneurPulse.com.au  Fact Sheet for Healthcare  Providers:  IncredibleEmployment.be  This test is no t yet approved or cleared by the [place] and  has been authorized for detection and/or diagnosis of SARS-CoV-2 by FDA under an Emergency Use Authorization (EUA). This EUA will remain  in effect (meaning this test can be used) for the duration of the COVID-19 declaration under Section 564(b)(1) of the Act, 21 U.S.C.section 360bbb-3(b)(1), unless the authorization is terminated  or revoked sooner.       Influenza A by PCR NEGATIVE NEGATIVE Final   Influenza B by PCR NEGATIVE NEGATIVE Final    Comment: (NOTE) The Xpert Xpress SARS-CoV-2/FLU/RSV plus assay is intended as an aid in the diagnosis of influenza from Nasopharyngeal swab specimens and should not be used as a sole basis for treatment. Nasal washings and aspirates are unacceptable for Xpert Xpress SARS-CoV-2/FLU/RSV testing.  Fact Sheet for Patients: EntrepreneurPulse.com.au  Fact Sheet for Healthcare Providers: IncredibleEmployment.be  This test is not yet approved or cleared by the [place] FDA and has been authorized for detection and/or diagnosis of SARS-CoV-2 by FDA under an Emergency Use Authorization (EUA). This EUA will remain in effect (meaning this test can be used) for the duration of the COVID-19 declaration under Section 564(b)(1) of the Act, 21 U.S.C. section 360bbb-3(b)(1), unless the authorization is terminated or revoked.  Performed at [facility], [address] [place], [place] [address]      Medications:   . amLODipine  10 mg Oral Daily  . aspirin  81 mg Oral Daily  . atorvastatin  20 mg Oral Daily  . folic acid  1 mg Oral Daily  . lisinopril  2.5 mg Oral Daily  . pantoprazole (PROTONIX) IV  40 mg Intravenous Q12H   Continuous Infusions: . sodium chloride    . sodium chloride        LOS: 1 day   Charlynne Cousins  [facility]  01/20/2021, 8:42 AM

## 2021-01-20 NOTE — Transfer of Care (Signed)
Immediate Anesthesia Transfer of Care Note  Patient: Dustin Golden  Procedure(s) Performed: COLONOSCOPY WITH PROPOFOL (Left ) BIOPSY  Patient Location: PACU  Anesthesia Type:MAC  Level of Consciousness: sedated and patient cooperative  Airway & Oxygen Therapy: Patient Spontanous Breathing and Patient connected to face mask oxygen  Post-op Assessment: Report given to RN and Post -op Vital signs reviewed and stable  Post vital signs: stable  Last Vitals:  Vitals Value Taken Time  BP 89/60 01/20/21 1300  Temp 36.7 C 01/20/21 1300  Pulse 62 01/20/21 1305  Resp 21 01/20/21 1306  SpO2 100 % 01/20/21 1305  Vitals shown include unvalidated device data.  Last Pain:  Vitals:   01/20/21 1300  TempSrc: Axillary  PainSc: Asleep      Patients Stated Pain Goal: 3 (15/40/08 6761)  Complications: No complications documented.

## 2021-01-21 DIAGNOSIS — I10 Essential (primary) hypertension: Secondary | ICD-10-CM | POA: Diagnosis not present

## 2021-01-21 DIAGNOSIS — K922 Gastrointestinal hemorrhage, unspecified: Secondary | ICD-10-CM | POA: Diagnosis not present

## 2021-01-21 DIAGNOSIS — I63512 Cerebral infarction due to unspecified occlusion or stenosis of left middle cerebral artery: Secondary | ICD-10-CM | POA: Diagnosis not present

## 2021-01-21 LAB — TYPE AND SCREEN
ABO/RH(D): O POS
Antibody Screen: NEGATIVE

## 2021-01-21 LAB — ABO/RH: ABO/RH(D): O POS

## 2021-01-21 LAB — SURGICAL PATHOLOGY

## 2021-01-21 MED ORDER — BOOST / RESOURCE BREEZE PO LIQD CUSTOM
1.0000 | Freq: Three times a day (TID) | ORAL | Status: DC
  Administered 2021-01-21 (×3): 1 via ORAL

## 2021-01-21 MED ORDER — ASPIRIN EC 81 MG PO TBEC: 81.0000 mg | DELAYED_RELEASE_TABLET | Freq: Every day | ORAL | Status: DC

## 2021-01-21 MED ORDER — CLONAZEPAM 0.125 MG PO TBDP
0.2500 mg | ORAL_TABLET | Freq: Two times a day (BID) | ORAL | Status: DC | PRN
  Administered 2021-01-21: 0.25 mg via ORAL
  Filled 2021-01-21: qty 2

## 2021-01-21 MED ORDER — ASPIRIN EC 81 MG PO TBEC
81.0000 mg | DELAYED_RELEASE_TABLET | Freq: Every day | ORAL | Status: DC
  Administered 2021-01-22: 81 mg via ORAL
  Filled 2021-01-21: qty 1

## 2021-01-21 NOTE — Progress Notes (Addendum)
[facility] PROGRESS NOTE    Progress Note  TREYVEON MOCHIZUKI  KAJ:[id number] DOB: 1956-10-12 DOA: 01/17/2021 PCP: Patient, No Pcp Per (Inactive)     Brief Narrative:   Dustin Golden is an 65 y.o. male of recent left ICA and left ICA stent small stroke (thought vessel to vessel embolism from a high-grade left ACA stenosis) status post mechanical thrombectomy and rescued left ICA stenting with mid residual right-sided weakness and this dysarthria upon discharge on aspirin and Brilinta presenting for bright red blood per rectum.  Significant studies: Endoscopy on 01/19/2021 showed mild erythematous mucosa in the duodenum mild hiatal hernia no other acute findings. Colonoscopy on 01/21/2019 from ulcerated cecal mass 10/22/2020 CT scan of the abdomen and pelvis evaluate no solid mass in the cecum compatible with malignancy.  Findings suspicious for metastatic disease in his CT abdomen and pelvis no liver masses. There are 3 scattered small solid subpleural nodules probably benign will need to follow-up as an outpatient 3 mm in size.  Antibiotics: None  Microbiology data: Blood culture:  Procedures: None  Assessment/Plan:   Acute GI bleed due to a fungating infiltrative and ulcerated large mass found in the cecum with partial obstruction: Recently discharged from the hospital his hemoglobin was 12.5 on admission 10.5. Currently asymptomatic hemoglobin stable. GI was consulted, colonoscopy showed a large cecal mass. CT scan of the abdomen pelvis showed cecal mass no findingds suspicious for metastatic disease, with partial obstruction, biopsy results are pending General surgery was consulted, who recommended surgical intervention. Discussed the case with neurology we agreed to start him on IV heparin bridge hold antiplatelet therapy resume as soon as possible and transfuse him as needed. Surgery to dictated when to re-start heparin or DAPT after surgical intervention.  Recent left ACA and  MCA/M2 stroke status post left ICA mechanical thrombectomy followed by rescue stenting with mid residual upper and lower extremity weakness and dysarthria: Hold aspirin and Brilinta. Start him on IV heparin he had a recent stent placed 3 to 4 weeks ago. After surgical procedure surgery to dictate when we can start back on IV heparin to try to protect that stent  Essential hypertension: Continue amlodipine and lisinopril.  Hyperlipidemia: Continue statins.  DVT prophylaxis: SCD Family Communication:none Status is: Inpatient The patient will require care spanning > 2 midnights and should be moved to inpatient because: Hemodynamically unstable  Dispo: The patient is from: Home              Anticipated d/c is to: Home              Patient currently is not medically stable to d/c.   Difficult to place patient No        Code Status:     Code Status Orders  (From admission, onward)         Start     Ordered   01/17/21 2046  Full code  Continuous        01/17/21 2046        Code Status History    Date Active Date Inactive Code Status Order ID Comments User Context   12/17/2020 1603 12/28/2020 1700 Full Code [id number]  Elizabeth Sauer Inpatient   12/17/2020 1603 12/17/2020 1603 Full Code [id number]  Cathlyn Parsons, PA-C Inpatient   12/13/2020 0559 12/17/2020 1524 Full Code [id number]  Lorenza Chick, MD Inpatient   12/13/2020 0559 12/13/2020 0559 Full Code [id number]  Lorenza Chick, MD Inpatient   Advance Care  Planning Activity        IV Access:    Peripheral IV   Procedures and diagnostic studies:   CT CHEST W CONTRAST  Result Date: 01/20/2021 CLINICAL DATA:  Inpatient. Colorectal cancer staging. Large ulcerated partially obstructing mass found in the cecum on colonoscopy today. EXAM: CT CHEST, ABDOMEN, AND PELVIS WITH CONTRAST TECHNIQUE: Multidetector CT imaging of the chest, abdomen and pelvis was performed following the standard protocol during bolus  administration of intravenous contrast. CONTRAST:  172mL OMNIPAQUE IOHEXOL 300 MG/ML  SOLN COMPARISON:  None. FINDINGS: CT CHEST FINDINGS Cardiovascular: Normal heart size. No significant pericardial effusion/thickening. Three-vessel coronary atherosclerosis. Atherosclerotic nonaneurysmal thoracic aorta. Normal caliber pulmonary arteries. No central pulmonary emboli. Mediastinum/Nodes: No discrete thyroid nodules. Unremarkable esophagus. No pathologically enlarged axillary, mediastinal or hilar lymph nodes. Lungs/Pleura: No pneumothorax. No pleural effusion. No acute consolidative airspace disease or lung masses. Three scattered small solid subpleural right pulmonary nodules, largest 3 mm in the right middle lobe (series 4/image 131). Musculoskeletal:  No aggressive appearing focal osseous lesions. CT ABDOMEN PELVIS FINDINGS Hepatobiliary: Normal liver with no liver mass. Normal gallbladder with no radiopaque cholelithiasis. No biliary ductal dilatation. Pancreas: Normal, with no mass or duct dilation. Spleen: Normal size. No mass. Adrenals/Urinary Tract: Normal adrenals. Normal kidneys with no hydronephrosis and no renal mass. Normal bladder. Stomach/Bowel: Normal non-distended stomach. Normal caliber small bowel with no small bowel wall thickening. Oral contrast transits to the transverse colon. Appendectomy. Irregular solid 4.5 x 2.9 x 3.2 cm polypoid mass in the cecum (series 2/image 101). Moderate diffuse colonic diverticulosis with no additional sites of definite large bowel wall thickening and no significant pericolonic fat stranding. Vascular/Lymphatic: Atherosclerotic abdominal aorta with dilated 2.8 cm infrarenal abdominal aorta. Patent portal, splenic, hepatic and renal veins. Small pericecal lymph nodes measure up to 0.4 cm short axis diameter (series 2/image 101). No pathologically enlarged lymph nodes in the abdomen or pelvis. Reproductive: Mildly enlarged prostate. Other: No pneumoperitoneum, ascites  or focal fluid collection. Musculoskeletal: No aggressive appearing focal osseous lesions. Moderate lumbar spondylosis. IMPRESSION: 1. Irregular solid 4.5 x 2.9 x 3.2 cm polypoid mass in the cecum, compatible with primary cecal malignancy. 2. Small pericecal lymph nodes measure up to 0.4 cm short axis diameter, nonspecific. 3. No findings highly suspicious for metastatic disease in the chest, abdomen or pelvis. No liver masses. 4. Three scattered small solid subpleural right pulmonary nodules, largest 3 mm, probably benign. Initial follow-up chest CT suggested in 3 months. 5. Moderate diffuse colonic diverticulosis. 6. Mildly enlarged prostate. 7. Aortic Atherosclerosis (ICD10-I70.0). Electronically Signed   By: Ilona Sorrel M.D.   On: 01/20/2021 20:12   CT ABDOMEN PELVIS W CONTRAST  Result Date: 01/20/2021 CLINICAL DATA:  Inpatient. Colorectal cancer staging. Large ulcerated partially obstructing mass found in the cecum on colonoscopy today. EXAM: CT CHEST, ABDOMEN, AND PELVIS WITH CONTRAST TECHNIQUE: Multidetector CT imaging of the chest, abdomen and pelvis was performed following the standard protocol during bolus administration of intravenous contrast. CONTRAST:  161mL OMNIPAQUE IOHEXOL 300 MG/ML  SOLN COMPARISON:  None. FINDINGS: CT CHEST FINDINGS Cardiovascular: Normal heart size. No significant pericardial effusion/thickening. Three-vessel coronary atherosclerosis. Atherosclerotic nonaneurysmal thoracic aorta. Normal caliber pulmonary arteries. No central pulmonary emboli. Mediastinum/Nodes: No discrete thyroid nodules. Unremarkable esophagus. No pathologically enlarged axillary, mediastinal or hilar lymph nodes. Lungs/Pleura: No pneumothorax. No pleural effusion. No acute consolidative airspace disease or lung masses. Three scattered small solid subpleural right pulmonary nodules, largest 3 mm in the right middle lobe (series 4/image 131). Musculoskeletal:  No aggressive appearing focal osseous lesions.  CT ABDOMEN PELVIS FINDINGS Hepatobiliary: Normal liver with no liver mass. Normal gallbladder with no radiopaque cholelithiasis. No biliary ductal dilatation. Pancreas: Normal, with no mass or duct dilation. Spleen: Normal size. No mass. Adrenals/Urinary Tract: Normal adrenals. Normal kidneys with no hydronephrosis and no renal mass. Normal bladder. Stomach/Bowel: Normal non-distended stomach. Normal caliber small bowel with no small bowel wall thickening. Oral contrast transits to the transverse colon. Appendectomy. Irregular solid 4.5 x 2.9 x 3.2 cm polypoid mass in the cecum (series 2/image 101). Moderate diffuse colonic diverticulosis with no additional sites of definite large bowel wall thickening and no significant pericolonic fat stranding. Vascular/Lymphatic: Atherosclerotic abdominal aorta with dilated 2.8 cm infrarenal abdominal aorta. Patent portal, splenic, hepatic and renal veins. Small pericecal lymph nodes measure up to 0.4 cm short axis diameter (series 2/image 101). No pathologically enlarged lymph nodes in the abdomen or pelvis. Reproductive: Mildly enlarged prostate. Other: No pneumoperitoneum, ascites or focal fluid collection. Musculoskeletal: No aggressive appearing focal osseous lesions. Moderate lumbar spondylosis. IMPRESSION: 1. Irregular solid 4.5 x 2.9 x 3.2 cm polypoid mass in the cecum, compatible with primary cecal malignancy. 2. Small pericecal lymph nodes measure up to 0.4 cm short axis diameter, nonspecific. 3. No findings highly suspicious for metastatic disease in the chest, abdomen or pelvis. No liver masses. 4. Three scattered small solid subpleural right pulmonary nodules, largest 3 mm, probably benign. Initial follow-up chest CT suggested in 3 months. 5. Moderate diffuse colonic diverticulosis. 6. Mildly enlarged prostate. 7. Aortic Atherosclerosis (ICD10-I70.0). Electronically Signed   By: Ilona Sorrel M.D.   On: 01/20/2021 20:12     Medical Consultants:     None.   Subjective:    Kathe Mariner in a good mood today  Objective:    Vitals:   01/20/21 1350 01/20/21 1405 01/20/21 2048 01/21/21 0532  BP: 140/75 113/65 119/78 132/70  Pulse: 67  78 84  Resp: 14 18 16 16   Temp:   97.8 F (36.6 C)   TempSrc:   Oral   SpO2: 98% 97% 96% 99%  Weight:      Height:       SpO2: 99 % O2 Flow Rate (L/min): 5 L/min   Intake/Output Summary (Last 24 hours) at 01/21/2021 0955 Last data filed at 01/21/2021 0904 Gross per 24 hour  Intake 860 ml  Output 2350 ml  Net -1490 ml   Filed Weights   01/17/21 1352 01/20/21 1135  Weight: 87.2 kg 87.2 kg    Exam: General exam: In no acute distress. Respiratory system: Good air movement and clear to auscultation. Cardiovascular system: S1 & S2 heard, RRR. No JVD. Gastrointestinal system: Abdomen is nondistended, soft and nontender.  Extremities: No pedal edema. Skin: No rashes, lesions or ulcers Psychiatry: Judgement and insight appear normal. Mood & affect appropriate.   Data Reviewed:    Labs: Basic Metabolic Panel: Recent Labs  Lab 01/17/21 1444 01/18/21 0506  NA 134* 139  K 3.8 3.9  CL 100 104  CO2 25 24  GLUCOSE 98 100*  BUN 13 12  CREATININE 1.01 1.24  CALCIUM 9.3 9.4   GFR Estimated Creatinine Clearance: 71.9 mL/min (by C-G formula based on SCr of 1.24 mg/dL). Liver Function Tests: Recent Labs  Lab 01/17/21 1444  AST 20  ALT 19  ALKPHOS 84  BILITOT 0.6  PROT 8.0  ALBUMIN 4.5   No results for input(s): LIPASE, AMYLASE in the last 168 hours. No results for input(s): AMMONIA  in the last 168 hours. Coagulation profile Recent Labs  Lab 01/17/21 1444  INR 0.9   COVID-19 Labs  No results for input(s): DDIMER, FERRITIN, LDH, CRP in the last 72 hours.  Lab Results  Component Value Date   SARSCOV2NAA NEGATIVE 01/17/2021   [place] NEGATIVE 12/13/2020    CBC: Recent Labs  Lab 01/17/21 1444 01/18/21 0506 01/19/21 0415  WBC 7.5 7.2 5.6  HGB 11.2* 10.8*  10.4*  HCT 33.4* 33.0* 31.6*  MCV 78.6* 79.9* 80.0  PLT 238 220 211   Cardiac Enzymes: No results for input(s): CKTOTAL, CKMB, CKMBINDEX, TROPONINI in the last 168 hours. BNP (last 3 results) No results for input(s): PROBNP in the last 8760 hours. CBG: No results for input(s): GLUCAP in the last 168 hours. D-Dimer: No results for input(s): DDIMER in the last 72 hours. Hgb A1c: No results for input(s): HGBA1C in the last 72 hours. Lipid Profile: No results for input(s): CHOL, HDL, LDLCALC, TRIG, CHOLHDL, LDLDIRECT in the last 72 hours. Thyroid function studies: No results for input(s): TSH, T4TOTAL, T3FREE, THYROIDAB in the last 72 hours.  Invalid input(s): FREET3 Anemia work up: No results for input(s): VITAMINB12, FOLATE, FERRITIN, TIBC, IRON, RETICCTPCT in the last 72 hours. Sepsis Labs: Recent Labs  Lab 01/17/21 1444 01/18/21 0506 01/19/21 0415  WBC 7.5 7.2 5.6   Microbiology Recent Results (from the past 240 hour(s))  Resp Panel by RT-PCR (Flu A&B, Covid) Nasopharyngeal Swab     Status: None   Collection Time: 01/17/21  3:47 PM   Specimen: Nasopharyngeal Swab; Nasopharyngeal(NP) swabs in vial transport medium  Result Value Ref Range Status   SARS Coronavirus 2 by RT PCR NEGATIVE NEGATIVE Final    Comment: (NOTE) SARS-CoV-2 target nucleic acids are NOT DETECTED.  The SARS-CoV-2 RNA is generally detectable in upper respiratory specimens during the acute phase of infection. The lowest concentration of SARS-CoV-2 viral copies this assay can detect is 138 copies/mL. A negative result does not preclude SARS-Cov-2 infection and should not be used as the sole basis for treatment or other patient management decisions. A negative result may occur with  improper specimen collection/handling, submission of specimen other than nasopharyngeal swab, presence of viral mutation(s) within the areas targeted by this assay, and inadequate number of viral copies(<138 copies/mL). A  negative result must be combined with clinical observations, patient history, and epidemiological information. The expected result is Negative.  Fact Sheet for Patients:  EntrepreneurPulse.com.au  Fact Sheet for Healthcare Providers:  IncredibleEmployment.be  This test is no t yet approved or cleared by the [place] FDA and  has been authorized for detection and/or diagnosis of SARS-CoV-2 by FDA under an Emergency Use Authorization (EUA). This EUA will remain  in effect (meaning this test can be used) for the duration of the COVID-19 declaration under Section 564(b)(1) of the Act, 21 U.S.C.section 360bbb-3(b)(1), unless the authorization is terminated  or revoked sooner.       Influenza A by PCR NEGATIVE NEGATIVE Final   Influenza B by PCR NEGATIVE NEGATIVE Final    Comment: (NOTE) The Xpert Xpress SARS-CoV-2/FLU/RSV plus assay is intended as an aid in the diagnosis of influenza from Nasopharyngeal swab specimens and should not be used as a sole basis for treatment. Nasal washings and aspirates are unacceptable for Xpert Xpress SARS-CoV-2/FLU/RSV testing.  Fact Sheet for Patients: EntrepreneurPulse.com.au  Fact Sheet for Healthcare Providers: IncredibleEmployment.be  This test is not yet approved or cleared by the [place] and has been authorized for  detection and/or diagnosis of SARS-CoV-2 by FDA under an Emergency Use Authorization (EUA). This EUA will remain in effect (meaning this test can be used) for the duration of the COVID-19 declaration under Section 564(b)(1) of the Act, 21 U.S.C. section 360bbb-3(b)(1), unless the authorization is terminated or revoked.  Performed at [facility], [address] [place], [place] [address]      Medications:   . amLODipine  10 mg Oral Daily  . atorvastatin  20 mg Oral Daily  . feeding supplement  1 Container Oral TID BM  .  folic acid  1 mg Oral Daily  . lisinopril  2.5 mg Oral Daily  . pantoprazole (PROTONIX) IV  40 mg Intravenous Q12H  . QUEtiapine  25 mg Oral BID   Continuous Infusions:     LOS: 2 days   Charlynne Cousins  [facility]  01/21/2021, 9:55 AM

## 2021-01-21 NOTE — Consult Note (Signed)
[facility] Surgery Consult Note  Dustin Golden 1956/07/18  [id number].    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 States that this started at least 1 week prior to admission, possibly longer but he's not sure. He reports associated weakness and dizziness. Denies abdominal pain, nausea, vomiting, fever, change in stools. He was tolerating a regular diet prior to admission. He has never had a colonoscopy.  Patient underwent endoscopic evaluation yesterday and was found to have a partially obstructing tumor in the cecum, this was biopsied. General surgery asked to see.  In regards to his stroke he states that he has been improving. Continues to have some right sided weakness but this is getting better. He ambulates without assistive device and lives at home independently.   -Abdominal surgical history: open appendectomy, surgery 20 years ago for ulcer disease -Former smoker, quit 11/2020 -Admits to occasional cocaine use (last use 1 month ago), THC -Drinks alcohol 3 days/week, beer/whiskey and has 3-5 drinks at a time -Employment: not working since stroke, previously was a Administrator, also worked in [employer]  Review of Systems  Constitutional: Negative.   Respiratory: Negative.   Cardiovascular: Negative.   Gastrointestinal: Positive for blood in stool. Negative for abdominal pain, constipation, diarrhea, nausea and vomiting.  Neurological: Positive for weakness.   All systems reviewed and otherwise negative except for as above  Family History  Problem Relation Age of Onset  . Hypertension Mother   . Hypertension Father     Past Medical History:  Diagnosis Date  .  Hypertension   . Stroke [facility])     Past Surgical History:  Procedure Laterality Date  . APPENDECTOMY    . ESOPHAGOGASTRODUODENOSCOPY N/A 01/19/2021   Procedure: ESOPHAGOGASTRODUODENOSCOPY (EGD);  Surgeon: Arta Silence, MD;  Location: [facility];  Service: Endoscopy;  Laterality: N/A;  . gastric ulcer surgery    . IR CT HEAD LTD  12/13/2020  . IR INTRAVSC STENT CERV CAROTID W/EMB-PROT MOD SED INCL ANGIO  12/14/2020  . IR PERCUTANEOUS ART THROMBECTOMY/INFUSION INTRACRANIAL INC DIAG ANGIO  12/13/2020  . IR US GUIDE VASC ACCESS RIGHT  12/13/2020  . RADIOLOGY WITH ANESTHESIA N/A 12/13/2020   Procedure: IR WITH ANESTHESIA;  Surgeon: Luanne Bras, MD;  Location: [place];  Service: Radiology;  Laterality: N/A;    Social History:  reports that he quit smoking about 5 weeks ago. He has a 96.00 pack-year smoking history. He has never used smokeless tobacco. He reports current alcohol use. He reports current drug use. Drug: Marijuana.  Allergies:  Allergies  Allergen Reactions  . Ibuprofen Other (See Comments)    Nosebleeds   . Sulfa Antibiotics Rash and Other (See Comments)    "Blisters to skin with cream"  . Sulfamethoxazole Rash and Other (See Comments)    "Blisters to skin with cream"    Medications Prior to Admission  Medication Sig Dispense Refill  . acetaminophen (TYLENOL) 325 MG tablet Take 2 tablets (650 mg total) by mouth every 4 (four) hours as needed for mild pain (or temp > 37.5 C (99.5 F)).    Marland Kitchen amLODipine (NORVASC) 10 MG tablet Take 1 tablet (10 mg total) by mouth daily. 30 tablet 0  .  aspirin 81 MG chewable tablet Chew 1 tablet (81 mg total) by mouth daily.    Marland Kitchen atorvastatin (LIPITOR) 20 MG tablet Take 1 tablet (20 mg total) by mouth daily. 30 tablet 0  . folic acid (FOLVITE) 1 MG tablet Take 1 tablet (1 mg total) by mouth daily. 30 tablet 0  . lisinopril (ZESTRIL) 2.5 MG tablet Take 1 tablet (2.5 mg total) by mouth daily. 30 tablet 0  . ticagrelor (BRILINTA) 90 MG TABS  tablet Take 1 tablet (90 mg total) by mouth 2 (two) times daily. 60 tablet 0    Prior to Admission medications   Medication Sig Start Date End Date Taking? Authorizing Provider  acetaminophen (TYLENOL) 325 MG tablet Take 2 tablets (650 mg total) by mouth every 4 (four) hours as needed for mild pain (or temp > 37.5 C (99.5 F)). 12/17/20  Yes Bailey-Modzik, Delila A, NP  amLODipine (NORVASC) 10 MG tablet Take 1 tablet (10 mg total) by mouth daily. 01/05/21  Yes Bayard Hugger, NP  aspirin 81 MG chewable tablet Chew 1 tablet (81 mg total) by mouth daily. 12/18/20  Yes Bailey-Modzik, Delila A, NP  atorvastatin (LIPITOR) 20 MG tablet Take 1 tablet (20 mg total) by mouth daily. 01/05/21  Yes Bayard Hugger, NP  folic acid (FOLVITE) 1 MG tablet Take 1 tablet (1 mg total) by mouth daily. 12/27/20  Yes Angiulli, Lavon Paganini, PA-C  lisinopril (ZESTRIL) 2.5 MG tablet Take 1 tablet (2.5 mg total) by mouth daily. 01/05/21  Yes Bayard Hugger, NP  ticagrelor (BRILINTA) 90 MG TABS tablet Take 1 tablet (90 mg total) by mouth 2 (two) times daily. 12/27/20  Yes Angiulli, Lavon Paganini, PA-C    Blood pressure 132/70, pulse 84, temperature 97.8 F (36.6 C), temperature source Oral, resp. rate 16, height 6\' 3"  (1.905 m), weight 87.2 kg, SpO2 99 %. Physical Exam: General: WD/WN male who is laying in bed in NAD HEENT: head is normocephalic, atraumatic.  Sclera are noninjected.  Pupils equal and round.  Ears and nose without any masses or lesions.  Mouth is pink and moist. Dentition fair Heart: regular, rate, and rhythm.  Normal s1,s2. No obvious murmurs, gallops, or rubs noted.  Palpable pedal pulses bilaterally  Lungs: CTAB, no wheezes, rhonchi, or rales noted.  Respiratory effort nonlabored Abd: well healed vertical RLQ incision and midline incision, soft, NT/ND, +BS, no masses, hernias, or organomegaly MS: no BUE/BLE edema, calves soft and nontender Skin: warm and dry with no masses, lesions, or rashes Psych: A&Ox4 with an  appropriate affect Neuro: cranial nerves grossly intact, moving all 4 extremities but some weakness noted to RUE and RLE, mild dysarthria, thought process intact  No results found for this or any previous visit (from the past 48 hour(s)). CT CHEST W CONTRAST  Result Date: 01/20/2021 CLINICAL DATA:  Inpatient. Colorectal cancer staging. Large ulcerated partially obstructing mass found in the cecum on colonoscopy today. EXAM: CT CHEST, ABDOMEN, AND PELVIS WITH CONTRAST TECHNIQUE: Multidetector CT imaging of the chest, abdomen and pelvis was performed following the standard protocol during bolus administration of intravenous contrast. CONTRAST:  110mL OMNIPAQUE IOHEXOL 300 MG/ML  SOLN COMPARISON:  None. FINDINGS: CT CHEST FINDINGS Cardiovascular: Normal heart size. No significant pericardial effusion/thickening. Three-vessel coronary atherosclerosis. Atherosclerotic nonaneurysmal thoracic aorta. Normal caliber pulmonary arteries. No central pulmonary emboli. Mediastinum/Nodes: No discrete thyroid nodules. Unremarkable esophagus. No pathologically enlarged axillary, mediastinal or hilar lymph nodes. Lungs/Pleura: No pneumothorax. No pleural effusion. No acute consolidative airspace disease or lung  masses. Three scattered small solid subpleural right pulmonary nodules, largest 3 mm in the right middle lobe (series 4/image 131). Musculoskeletal:  No aggressive appearing focal osseous lesions. CT ABDOMEN PELVIS FINDINGS Hepatobiliary: Normal liver with no liver mass. Normal gallbladder with no radiopaque cholelithiasis. No biliary ductal dilatation. Pancreas: Normal, with no mass or duct dilation. Spleen: Normal size. No mass. Adrenals/Urinary Tract: Normal adrenals. Normal kidneys with no hydronephrosis and no renal mass. Normal bladder. Stomach/Bowel: Normal non-distended stomach. Normal caliber small bowel with no small bowel wall thickening. Oral contrast transits to the transverse colon. Appendectomy. Irregular  solid 4.5 x 2.9 x 3.2 cm polypoid mass in the cecum (series 2/image 101). Moderate diffuse colonic diverticulosis with no additional sites of definite large bowel wall thickening and no significant pericolonic fat stranding. Vascular/Lymphatic: Atherosclerotic abdominal aorta with dilated 2.8 cm infrarenal abdominal aorta. Patent portal, splenic, hepatic and renal veins. Small pericecal lymph nodes measure up to 0.4 cm short axis diameter (series 2/image 101). No pathologically enlarged lymph nodes in the abdomen or pelvis. Reproductive: Mildly enlarged prostate. Other: No pneumoperitoneum, ascites or focal fluid collection. Musculoskeletal: No aggressive appearing focal osseous lesions. Moderate lumbar spondylosis. IMPRESSION: 1. Irregular solid 4.5 x 2.9 x 3.2 cm polypoid mass in the cecum, compatible with primary cecal malignancy. 2. Small pericecal lymph nodes measure up to 0.4 cm short axis diameter, nonspecific. 3. No findings highly suspicious for metastatic disease in the chest, abdomen or pelvis. No liver masses. 4. Three scattered small solid subpleural right pulmonary nodules, largest 3 mm, probably benign. Initial follow-up chest CT suggested in 3 months. 5. Moderate diffuse colonic diverticulosis. 6. Mildly enlarged prostate. 7. Aortic Atherosclerosis (ICD10-I70.0). Electronically Signed   By: Ilona Sorrel M.D.   On: 01/20/2021 20:12   CT ABDOMEN PELVIS W CONTRAST  Result Date: 01/20/2021 CLINICAL DATA:  Inpatient. Colorectal cancer staging. Large ulcerated partially obstructing mass found in the cecum on colonoscopy today. EXAM: CT CHEST, ABDOMEN, AND PELVIS WITH CONTRAST TECHNIQUE: Multidetector CT imaging of the chest, abdomen and pelvis was performed following the standard protocol during bolus administration of intravenous contrast. CONTRAST:  140mL OMNIPAQUE IOHEXOL 300 MG/ML  SOLN COMPARISON:  None. FINDINGS: CT CHEST FINDINGS Cardiovascular: Normal heart size. No significant pericardial  effusion/thickening. Three-vessel coronary atherosclerosis. Atherosclerotic nonaneurysmal thoracic aorta. Normal caliber pulmonary arteries. No central pulmonary emboli. Mediastinum/Nodes: No discrete thyroid nodules. Unremarkable esophagus. No pathologically enlarged axillary, mediastinal or hilar lymph nodes. Lungs/Pleura: No pneumothorax. No pleural effusion. No acute consolidative airspace disease or lung masses. Three scattered small solid subpleural right pulmonary nodules, largest 3 mm in the right middle lobe (series 4/image 131). Musculoskeletal:  No aggressive appearing focal osseous lesions. CT ABDOMEN PELVIS FINDINGS Hepatobiliary: Normal liver with no liver mass. Normal gallbladder with no radiopaque cholelithiasis. No biliary ductal dilatation. Pancreas: Normal, with no mass or duct dilation. Spleen: Normal size. No mass. Adrenals/Urinary Tract: Normal adrenals. Normal kidneys with no hydronephrosis and no renal mass. Normal bladder. Stomach/Bowel: Normal non-distended stomach. Normal caliber small bowel with no small bowel wall thickening. Oral contrast transits to the transverse colon. Appendectomy. Irregular solid 4.5 x 2.9 x 3.2 cm polypoid mass in the cecum (series 2/image 101). Moderate diffuse colonic diverticulosis with no additional sites of definite large bowel wall thickening and no significant pericolonic fat stranding. Vascular/Lymphatic: Atherosclerotic abdominal aorta with dilated 2.8 cm infrarenal abdominal aorta. Patent portal, splenic, hepatic and renal veins. Small pericecal lymph nodes measure up to 0.4 cm short axis diameter (series 2/image 101). No pathologically  enlarged lymph nodes in the abdomen or pelvis. Reproductive: Mildly enlarged prostate. Other: No pneumoperitoneum, ascites or focal fluid collection. Musculoskeletal: No aggressive appearing focal osseous lesions. Moderate lumbar spondylosis. IMPRESSION: 1. Irregular solid 4.5 x 2.9 x 3.2 cm polypoid mass in the cecum,  compatible with primary cecal malignancy. 2. Small pericecal lymph nodes measure up to 0.4 cm short axis diameter, nonspecific. 3. No findings highly suspicious for metastatic disease in the chest, abdomen or pelvis. No liver masses. 4. Three scattered small solid subpleural right pulmonary nodules, largest 3 mm, probably benign. Initial follow-up chest CT suggested in 3 months. 5. Moderate diffuse colonic diverticulosis. 6. Mildly enlarged prostate. 7. Aortic Atherosclerosis (ICD10-I70.0). Electronically Signed   By: Ilona Sorrel M.D.   On: 01/20/2021 20:12   Anti-infectives (From admission, onward)   None        Assessment/Plan HTN Former heavy smoker - quit 11/2020 Alcohol dependence Polysubstance abuse - THC, cocaine (most recently 1 month ago) Anemia  Recent left ICA and left MCA/M2 infarction s/p mechanical thrombectomy and left carotid stenting and angioplasty 12/13/20 on aspirin and brillinta (last dose 01/17/21 in AM)  - per neurology "it is ideal to have some antiplatelet on board for at least 6 weeks to prevent stent thrombosis. But in his case, he requires a procedure urgently and emergently. He should be on a heparin drip-better than being on nothing and then antiplatelets resumed as soon as able postsurgically."  Partially obstructing tumor in the cecum - s/p colonoscopy 01/20/21, biopsy pending - CEA pending - CT chest/ abdomen/ pelvis shows solid 4.5 x 2.9 x 3.2 cm polypoid mass in the cecum, 3 scattered small solid subpleural right pulmonary nodules, No other findings highly suspicious for metastatic disease  - Patient is high risk for surgery at this time given recent stroke, but he is unable to tolerate antiplatelet therapy due to bleeding from cecal mass and therefore will need partial colectomy. Bowels are still prepped after colonoscopy yesterday. Continue clear liquid diet, add Boost breeze. Continue to hold brillinta and aspirin, brillinta will need to be held at least 5 days  prior to surgical intervention (today is day #4). Per [facility] plan to start IV heparin today, we can hold this several hours prior to surgery. Will discuss timing of surgery with MD, possibly over the weekend vs early next week.   ID - none VTE - SCDs, per [facility] plan to start IV heparin today FEN - CLD, Boost breeze Foley - none Follow up - TBD   Wellington [place], [facility] Surgery 01/21/2021, 8:27 AM Please see Amion for pager number during day hours 7:00am-4:30pm

## 2021-01-21 NOTE — Progress Notes (Signed)
Physical Therapy Treatment Patient Details Name: Dustin Golden MRN: [id number] DOB: 12/25/1955 Today's Date: 01/21/2021    History of Present Illness Pt admitted with melena and anemia and with history of recent CVA with residual mild R side weakness, mild right side inattention/awarenss and dysarthria.    PT Comments    Pt admitted as above and presenting as IND with all mobility tasks.  Pt up to ambulate significant distance in halls with no balance issues noted including quick turns and stop starts.  Pt states he was ambulating 2 miles a day prior to admit and feels he is near that baseline now.  Pt recently dc from OP neuro PT following recent CVA and pt states he works on his exercises on his own.  PT services will sign off at this time - pt with no present PT needs.   Follow Up Recommendations  No PT follow up     Equipment Recommendations  None recommended by PT    Recommendations for Other Services       Precautions / Restrictions Precautions Precautions: Fall Restrictions Weight Bearing Restrictions: No    Mobility  Bed Mobility               General bed mobility comments: Pt OOB and states no difficulty getting up    Transfers Overall transfer level: Modified independent Equipment used: None             General transfer comment: No physical assist - pt up/down from bench to complete dressing  Ambulation/Gait Ambulation/Gait assistance: Supervision;Independent Gait Distance (Feet): 600 Feet Assistive device: None Gait Pattern/deviations: WFL(Within Functional Limits)         Stairs             Wheelchair Mobility    Modified Rankin (Stroke Patients Only)       Balance Overall balance assessment: Independent     Sitting balance - Comments: Pt standing on one leg to don pants but then moved to sitting.                                    Cognition Arousal/Alertness: Awake/alert Behavior During Therapy: WFL for  tasks assessed/performed;Flat affect Overall Cognitive Status: Within Functional Limits for tasks assessed                                        Exercises      General Comments        Pertinent Vitals/Pain Pain Assessment: No/denies pain    Home Living Family/patient expects to be discharged to:: Private residence Living Arrangements: Spouse/significant other Available Help at Discharge: Family;Available 24 hours/day Type of Home: House Home Access: Stairs to enter   Home Layout: One level;Other (Comment);Laundry or 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;Crutches      Prior Function Level of Independence: Independent      Comments: States walking 2 miles a day prior to this admit   PT Goals (current goals can now be found in the care plan section) Acute Rehab PT Goals Patient Stated Goal: Survive this latest health issue PT Goal Formulation: All assessment and education complete, DC therapy    Frequency           PT Plan      Co-evaluation  AM-PAC PT "6 Clicks" Mobility   Outcome Measure  Help needed turning from your back to your side while in a flat bed without using bedrails?: None Help needed moving from lying on your back to sitting on the side of a flat bed without using bedrails?: None Help needed moving to and from a bed to a chair (including a wheelchair)?: None Help needed standing up from a chair using your arms (e.g., wheelchair or bedside chair)?: None Help needed to walk in hospital room?: None Help needed climbing 3-5 steps with a railing? : None 6 Click Score: 24    End of Session   Activity Tolerance: Patient tolerated treatment well Patient left: Other (comment) (standing in room working on "my exercises") Nurse Communication: Mobility status PT Visit Diagnosis: Unsteadiness on feet (R26.81)     Time: 3662-9476 PT Time Calculation (min) (ACUTE ONLY): 19 min  Charges:                         Debe Coder PT [place] Pager [phone] Office [phone]    Desirae Mancusi 01/21/2021, 12:53 PM

## 2021-01-21 NOTE — Progress Notes (Signed)
Patient with colon mass on imaging and colonoscopy.  Needs anticoagulation for recent stroke and intracerebral stent.  CT showed cecal mass with possible adenopathy but no widespread metastasis.  Further management per surgery and primary care team.

## 2021-01-22 DIAGNOSIS — K921 Melena: Secondary | ICD-10-CM | POA: Diagnosis not present

## 2021-01-22 DIAGNOSIS — C182 Malignant neoplasm of ascending colon: Secondary | ICD-10-CM

## 2021-01-22 DIAGNOSIS — I6522 Occlusion and stenosis of left carotid artery: Secondary | ICD-10-CM

## 2021-01-22 DIAGNOSIS — K635 Polyp of colon: Secondary | ICD-10-CM | POA: Diagnosis present

## 2021-01-22 DIAGNOSIS — K922 Gastrointestinal hemorrhage, unspecified: Secondary | ICD-10-CM | POA: Diagnosis not present

## 2021-01-22 DIAGNOSIS — I10 Essential (primary) hypertension: Secondary | ICD-10-CM | POA: Diagnosis not present

## 2021-01-22 HISTORY — DX: Polyp of colon: K63.5

## 2021-01-22 LAB — CBC
HCT: 32.6 % — ABNORMAL LOW (ref 39.0–52.0)
Hemoglobin: 10.8 g/dL — ABNORMAL LOW (ref 13.0–17.0)
MCH: 26.6 pg (ref 26.0–34.0)
MCHC: 33.1 g/dL (ref 30.0–36.0)
MCV: 80.3 fL (ref 80.0–100.0)
Platelets: 257 10*3/uL (ref 150–400)
RBC: 4.06 MIL/uL — ABNORMAL LOW (ref 4.22–5.81)
RDW: 13.8 % (ref 11.5–15.5)
WBC: 5.8 10*3/uL (ref 4.0–10.5)
nRBC: 0 % (ref 0.0–0.2)

## 2021-01-22 LAB — HEPARIN LEVEL (UNFRACTIONATED): Heparin Unfractionated: 0.18 IU/mL — ABNORMAL LOW (ref 0.30–0.70)

## 2021-01-22 LAB — CEA: CEA: 15.2 ng/mL — ABNORMAL HIGH (ref 0.0–4.7)

## 2021-01-22 MED ORDER — ASPIRIN EC 81 MG PO TBEC
81.0000 mg | DELAYED_RELEASE_TABLET | Freq: Every day | ORAL | Status: DC
  Administered 2021-01-23 – 2021-01-26 (×3): 81 mg via ORAL
  Filled 2021-01-22 (×3): qty 1

## 2021-01-22 MED ORDER — GABAPENTIN 300 MG PO CAPS
300.0000 mg | ORAL_CAPSULE | ORAL | Status: AC
  Administered 2021-01-25: 300 mg via ORAL

## 2021-01-22 MED ORDER — POLYETHYLENE GLYCOL 3350 17 GM/SCOOP PO POWD: 1.0000 | Freq: Once | ORAL | Status: DC

## 2021-01-22 MED ORDER — BOOST / RESOURCE BREEZE PO LIQD CUSTOM
1.0000 | Freq: Three times a day (TID) | ORAL | Status: DC
  Administered 2021-01-22 – 2021-01-29 (×9): 1 via ORAL

## 2021-01-22 MED ORDER — ENOXAPARIN SODIUM 40 MG/0.4ML ~~LOC~~ SOLN: 40.0000 mg | Freq: Once | SUBCUTANEOUS | Status: DC

## 2021-01-22 MED ORDER — ALVIMOPAN 12 MG PO CAPS
12.0000 mg | ORAL_CAPSULE | ORAL | Status: DC
  Administered 2021-01-25: 12 mg via ORAL
  Filled 2021-01-22: qty 1

## 2021-01-22 MED ORDER — ACETAMINOPHEN 500 MG PO TABS: 1000.0000 mg | ORAL_TABLET | ORAL | Status: AC

## 2021-01-22 MED ORDER — BUPIVACAINE LIPOSOME 1.3 % IJ SUSP
20.0000 mL | Freq: Once | INTRAMUSCULAR | Status: DC
  Filled 2021-01-22: qty 20

## 2021-01-22 MED ORDER — NEOMYCIN SULFATE 500 MG PO TABS
1000.0000 mg | ORAL_TABLET | ORAL | Status: AC
  Administered 2021-01-23 (×3): 1000 mg via ORAL
  Filled 2021-01-22 (×3): qty 2

## 2021-01-22 MED ORDER — SODIUM CHLORIDE 0.9 % IV SOLN: 2.0000 g | INTRAVENOUS | Status: DC

## 2021-01-22 MED ORDER — HEPARIN (PORCINE) 25000 UT/250ML-% IV SOLN
1250.0000 [IU]/h | INTRAVENOUS | Status: DC
  Administered 2021-01-22: 1200 [IU]/h via INTRAVENOUS
  Administered 2021-01-23: 1250 [IU]/h via INTRAVENOUS
  Filled 2021-01-22 (×2): qty 250

## 2021-01-22 MED ORDER — ENSURE PRE-SURGERY PO LIQD
296.0000 mL | Freq: Once | ORAL | Status: AC
  Administered 2021-01-24: 296 mL via ORAL
  Filled 2021-01-22: qty 296

## 2021-01-22 MED ORDER — POLYETHYLENE GLYCOL 3350 17 G PO PACK
17.0000 g | PACK | Freq: Two times a day (BID) | ORAL | Status: DC
  Administered 2021-01-22 – 2021-01-24 (×6): 17 g via ORAL
  Filled 2021-01-22 (×6): qty 1

## 2021-01-22 MED ORDER — METRONIDAZOLE 500 MG PO TABS
1000.0000 mg | ORAL_TABLET | ORAL | Status: AC
  Administered 2021-01-23 (×3): 1000 mg via ORAL
  Filled 2021-01-22 (×3): qty 2

## 2021-01-22 MED ORDER — ENSURE PRE-SURGERY PO LIQD
592.0000 mL | Freq: Once | ORAL | Status: AC
  Administered 2021-01-23: 592 mL via ORAL
  Filled 2021-01-22: qty 592

## 2021-01-22 MED ORDER — PANTOPRAZOLE SODIUM 40 MG PO TBEC
40.0000 mg | DELAYED_RELEASE_TABLET | Freq: Two times a day (BID) | ORAL | Status: DC
  Administered 2021-01-22 – 2021-01-26 (×9): 40 mg via ORAL
  Filled 2021-01-22 (×9): qty 1

## 2021-01-22 NOTE — Progress Notes (Signed)
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  EGD on 4/6 with mild erythematous mucosa in duodenum.  Colonoscopy on 4/7 with ulcerated cecal mass.  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  Pathology with high-grade dysplasia.  General surgery consulted and planning surgery on 4/11.  Patient is off DAPT.  Neurology recommended bridging with IV heparin.  Subjective: Seen and examined earlier this morning.  No major events overnight of this morning.  Patient is upset about "psychotic medicines".  He reports having headache as a result.  He is not sure why that was prescribed to him.  Otherwise, no complaints.  He denies chest pain, dyspnea, nausea, vomiting or abdominal pain.  Objective: Vitals:   01/21/21 0532 01/21/21 1324 01/21/21 2054 01/22/21 0607  BP: 132/70 118/87 98/63 107/66  Pulse: 84 96 80 79  Resp: 16 18 16 16   Temp:  98.2 F (36.8 C) 98.7 F (37.1 C) (!) 97.5 F (36.4 C)  TempSrc:  Oral Oral Oral  SpO2: 99% 99% 99% 97%  Weight:      Height:        Intake/Output Summary (Last 24 hours) at 01/22/2021 1319 Last data filed at 01/22/2021 0921 Gross per 24 hour  Intake 1080 ml  Output 1100 ml  Net -20 ml   Filed Weights   01/17/21 1352 01/20/21 1135  Weight: 87.2 kg 87.2 kg    Examination:  GENERAL: No apparent distress.  Nontoxic. HEENT: MMM.  Vision and hearing grossly intact.  NECK: Supple.  No apparent JVD.  RESP:  No IWOB.  Fair aeration bilaterally. CVS:  RRR. Heart sounds normal.  ABD/GI/GU:  BS+. Abd soft, NTND.  MSK/EXT:  Moves extremities. No apparent deformity. No edema.  SKIN: no apparent skin lesion or wound NEURO: Awake, alert and oriented appropriately.  RUE strength is 4/5.  Dysarthria. PSYCH: Somewhat upset about medications prescribed to him.  Procedures:  4/6-EGD with erythematous mucosa and duodenum 4/7-colonoscopy with fungating mass in cecum.  Pathology with high-grade dysplasia.  Microbiology summarized: COVID-19 and influenza PCR nonreactive.  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 -General surgery following-plan for surgical intervention early next week -Bridging DAPT with IV heparin per neurology recommendation  Hematochezia: Likely due to the above.  Relatively stable Recent Labs    12/13/20 0151 12/13/20 0200 12/14/20 0412 12/15/20 0235 12/20/20 0439 01/17/21 1444 01/18/21 0506 01/19/21 0415 01/22/21 0745  HGB 14.4 15.0 12.6* 12.2* 12.5* 11.2* 10.8* 10.4* 10.8*  -Monitor H&H now he is on IV heparin. -Check anemia panel  Recent left ACA and MCA/M2 stroke s/p left ICA mechanical thrombectomy followed by rescue stenting with mid residual upper and lower extremity weakness and dysarthria: Stable. -Hold aspirin and Brilinta.  Bridge with IV heparin per neurology recommendation -Resume aspirin and Brilinta after surgery as early as possible -PT/OT  Essential hypertension: Normotensive  -continue amlodipine and lisinopril.  Hyperlipidemia: Stable -Continue statins.  Concerns about medications: Patient was started on Seroquel with as needed Haldol and  Klonopin.  There is no documentation about the indication for these medications although Klonopin stays as needed for seizure.  Patient is questioning as to why these medications were prescribed to him.  Not sure if patient had some agitation or delirium after GI procedures.  -Discontinue Seroquel, as needed Haldol and  Klonopin  Body mass index is 24.03 kg/m.         DVT prophylaxis:  SCD's Start: 01/22/21 0818 SCDs Start: 01/17/21 2046 Patient is also on IV heparin. Code Status: Full code Family Communication: Patient and/or RN. Available if any question.  Level of care: Med-Surg Status is: Inpatient  Remains inpatient appropriate because:Ongoing diagnostic testing needed not appropriate for outpatient work up, IV treatments appropriate due to intensity of illness or inability to take PO and Inpatient level of care appropriate due to severity of illness   Dispo: The patient is from: Home              Anticipated d/c is to: Home              Patient currently is not medically stable to d/c.   Difficult to place patient No       Consultants:  Gastroenterology Neurology General surgery   Sch Meds:  Scheduled Meds: . [START ON 01/24/2021] acetaminophen  1,000 mg Oral On Call to OR  . [START ON 01/24/2021] alvimopan  12 mg Oral On Call to OR  . amLODipine  10 mg Oral Daily  . aspirin EC  81 mg Oral Daily  . atorvastatin  20 mg Oral Daily  . [START ON 01/24/2021] bupivacaine liposome  20 mL Infiltration Once  . feeding supplement  1 Container Oral TID WC  . [START ON 01/24/2021] feeding supplement  296 mL Oral Once  . [START ON 01/23/2021] feeding supplement  592 mL Oral Once  . folic acid  1 mg Oral Daily  . [START ON 01/24/2021] gabapentin  300 mg Oral On Call to OR  . lisinopril  2.5 mg Oral Daily  . [START ON 01/23/2021] neomycin  1,000 mg Oral 3 times per day   And  . [START ON 01/23/2021] metroNIDAZOLE  1,000 mg Oral 3 times per day  . pantoprazole  40 mg Oral BID  . polyethylene glycol  17 g Oral BID   Continuous Infusions: . [START ON 01/24/2021] cefoTEtan (CEFOTAN) IV    . heparin 1,200 Units/hr (01/22/21 1303)   PRN Meds:.acetaminophen **OR** acetaminophen, ondansetron **OR** ondansetron (ZOFRAN) IV  Antimicrobials: Anti-infectives (From admission, onward)   Start      Dose/Rate Route Frequency Ordered Stop   01/24/21 0600  cefoTEtan (CEFOTAN) 2 g in sodium chloride 0.9 % 100 mL IVPB        2 g 200 mL/hr over 30 Minutes Intravenous On call to O.R. 01/22/21 0350 01/25/21 0559   01/23/21 1400  neomycin (MYCIFRADIN) tablet 1,000 mg       "And" Linked Group Details   1,000 mg Oral 3 times per day 01/22/21 0821 01/24/21 1359   01/23/21 1400  metroNIDAZOLE (FLAGYL) tablet 1,000 mg       "And" Linked Group Details   1,000 mg Oral 3 times per day 01/22/21 0821 01/24/21 1359       I have personally reviewed the following labs and images: CBC: Recent Labs  Lab 01/17/21 1444 01/18/21 0506 01/19/21 0415 01/22/21 0745  WBC 7.5 7.2 5.6 5.8  HGB 11.2* 10.8* 10.4* 10.8*  HCT 33.4* 33.0* 31.6* 32.6*  MCV 78.6*  79.9* 80.0 80.3  PLT 238 220 211 257   BMP &GFR Recent Labs  Lab 01/17/21 1444 01/18/21 0506  NA 134* 139  K 3.8 3.9  CL 100 104  CO2 25 24  GLUCOSE 98 100*  BUN 13 12  CREATININE 1.01 1.24  CALCIUM 9.3 9.4   Estimated Creatinine Clearance: 71.9 mL/min (by C-G formula based on SCr of 1.24 mg/dL). Liver & Pancreas: Recent Labs  Lab 01/17/21 1444  AST 20  ALT 19  ALKPHOS 84  BILITOT 0.6  PROT 8.0  ALBUMIN 4.5   No results for input(s): LIPASE, AMYLASE in the last 168 hours. No results for input(s): AMMONIA in the last 168 hours. Diabetic: No results for input(s): HGBA1C in the last 72 hours. No results for input(s): GLUCAP in the last 168 hours. Cardiac Enzymes: No results for input(s): CKTOTAL, CKMB, CKMBINDEX, TROPONINI in the last 168 hours. No results for input(s): PROBNP in the last 8760 hours. Coagulation Profile: Recent Labs  Lab 01/17/21 1444  INR 0.9   Thyroid Function Tests: No results for input(s): TSH, T4TOTAL, FREET4, T3FREE, THYROIDAB in the last 72 hours. Lipid Profile: No results for input(s): CHOL, HDL, LDLCALC, TRIG, CHOLHDL, LDLDIRECT in the last 72 hours. Anemia Panel: No results for input(s):  VITAMINB12, FOLATE, FERRITIN, TIBC, IRON, RETICCTPCT in the last 72 hours. Urine analysis:    Component Value Date/Time   COLORURINE STRAW (A) 12/13/2020 1245   APPEARANCEUR CLEAR 12/13/2020 1245   LABSPEC 1.019 12/13/2020 1245   PHURINE 7.0 12/13/2020 1245   GLUCOSEU NEGATIVE 12/13/2020 1245   HGBUR NEGATIVE 12/13/2020 1245   [place] 12/13/2020 1245   [place] 12/13/2020 1245   PROTEINUR NEGATIVE 12/13/2020 1245   NITRITE NEGATIVE 12/13/2020 1245   LEUKOCYTESUR NEGATIVE 12/13/2020 1245   Sepsis Labs: Invalid input(s): PROCALCITONIN, [place]  Microbiology: Recent Results (from the past 240 hour(s))  Resp Panel by RT-PCR (Flu A&B, Covid) Nasopharyngeal Swab     Status: None   Collection Time: 01/17/21  3:47 PM   Specimen: Nasopharyngeal Swab; Nasopharyngeal(NP) swabs in vial transport medium  Result Value Ref Range Status   SARS Coronavirus 2 by RT PCR NEGATIVE NEGATIVE Final    Comment: (NOTE) SARS-CoV-2 target nucleic acids are NOT DETECTED.  The SARS-CoV-2 RNA is generally detectable in upper respiratory specimens during the acute phase of infection. The lowest concentration of SARS-CoV-2 viral copies this assay can detect is 138 copies/mL. A negative result does not preclude SARS-Cov-2 infection and should not be used as the sole basis for treatment or other patient management decisions. A negative result may occur with  improper specimen collection/handling, submission of specimen other than nasopharyngeal swab, presence of viral mutation(s) within the areas targeted by this assay, and inadequate number of viral copies(<138 copies/mL). A negative result must be combined with clinical observations, patient history, and epidemiological information. The expected result is Negative.  Fact Sheet for Patients:  EntrepreneurPulse.com.au  Fact Sheet for Healthcare Providers:  IncredibleEmployment.be  This test  is no t yet approved or cleared by the [place] FDA and  has been authorized for detection and/or diagnosis of SARS-CoV-2 by FDA under an Emergency Use Authorization (EUA). This EUA will remain  in effect (meaning this test can be used) for the duration of the COVID-19 declaration under Section 564(b)(1) of the Act, 21 U.S.C.section 360bbb-3(b)(1), unless the authorization is terminated  or revoked sooner.       Influenza A by PCR NEGATIVE NEGATIVE Final   Influenza B  by PCR NEGATIVE NEGATIVE Final    Comment: (NOTE) The Xpert Xpress SARS-CoV-2/FLU/RSV plus assay is intended as an aid in the diagnosis of influenza from Nasopharyngeal swab specimens and should not be used as a sole basis for treatment. Nasal washings and aspirates are unacceptable for Xpert Xpress SARS-CoV-2/FLU/RSV testing.  Fact Sheet for Patients: EntrepreneurPulse.com.au  Fact Sheet for Healthcare Providers: IncredibleEmployment.be  This test is not yet approved or cleared by the [place] FDA and has been authorized for detection and/or diagnosis of SARS-CoV-2 by FDA under an Emergency Use Authorization (EUA). This EUA will remain in effect (meaning this test can be used) for the duration of the COVID-19 declaration under Section 564(b)(1) of the Act, 21 U.S.C. section 360bbb-3(b)(1), unless the authorization is terminated or revoked.  Performed at [facility], [address] [place], [place] [address]     Radiology Studies: No results found.    Bricen Victory T. Nambe  If 7PM-7AM, please contact night-coverage [email or web address] 01/22/2021, 1:19 PM

## 2021-01-22 NOTE — Progress Notes (Addendum)
Dustin Golden [id number] [birth year]  CARE TEAM:  PCP: Patient, No Pcp Per (Inactive)  Outpatient Care Team: Patient Care Team: Patient, No Pcp Per (Inactive) as PCP - General (General Practice)  Inpatient Treatment Team: Treatment Team: Attending Provider: Mercy Riding, MD; Rounding Team: Fatima Blank, MD; Consulting Physician: Arta Silence, MD; Consulting Physician: Ronald Lobo, MD; Consulting Physician: Edison Pace, Md, MD; Utilization Review: Alease Medina, RN; Consulting Physician: Amie Portland, MD   Problem List:   Principal Problem:   GIB (gastrointestinal bleeding) Active Problems:   Left middle cerebral artery stroke [facility])   Essential hypertension   Hyperlipidemia   Acute GI bleeding   2 Days Post-Op  01/17/2021 - 01/20/2021  Procedure(s): COLONOSCOPY WITH PROPOFOL BIOPSY  SURGICAL PATHOLOGY  CASE: [id number]  PATIENT: Dustin Golden  Surgical Pathology Report      Clinical History: Melena, anemia, negative endoscopy (jmc)      FINAL MICROSCOPIC DIAGNOSIS:   A. 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 Saralyn Pilar reviewed the case and concurs with the diagnosis. Dr.  Paulita Fujita was notified on 01/21/2021.       Dustin Golden DESCRIPTION:   Received in formalin are pink-gray soft tissue fragments that are  submitted in toto. Number: 7. Size: 0.1 to 0.4 cm. Blocks: 1.   SW 01/20/2021    Final Diagnosis performed by Jaquita Folds, MD.  Electronically  signed 01/21/2021  Technical and / or Professional components performed at [facility], [place] [address] [place], [place] [address].  Immunohistochemistry Technical component (if applicable) was performed  at [facility]. [address], [place],  [place], [place] [address].  IMMUNOHISTOCHEMISTRY DISCLAIMER (if applicable):  Some of these  immunohistochemical stains may have been developed and the  performance characteristics determine by [facility]. Some  may not have been cleared or approved by the U.S. Food and Drug  Administration. The FDA has determined that such clearance or approval  is not necessary. This test is used for clinical purposes. It should not  be regarded as investigational or for research. This laboratory is  certified under the [place]  (CLIA-88) as qualified to perform high complexity clinical laboratory  testing. The controls stained appropriately.   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  Need for anticoagulation given recent stroke and stenting.  Normally on Brilinta.  Last dose 4/4.  [facility] Stay = 3 days)  Plan:  -I think the patient would benefit from segmental colonic resection to remove the cecal tumor that is probably cancer.  Plan resection with immediate anastomosis since he is not in shock or perforated.  Ideally would allow his oral anticoagulation to wear off beyond 5-7 days.  I have tentatively posted the patient for Monday morning for Dr. [name] to do minimally invasive approach.  I spent a long time trying to explain to the patient the reasoning for the surgery.  He is concerned about the risks of surgery especially death.  I noted while his risks are increased given his recent stroke, he otherwise has a decent performance status and it seems like prognosis will be good with his stroke management if he can get back on his blood thinner safely which is not possible given the bleeding tumor.  I also noted the long-term risk of  untreated cancer will grow and ultimately threaten his life.  I think he was understanding this a little better.  He is leaning toward surgery but hesitates - will post for Monday AM.  He is worried about being malnourished since he has not  had solid food for a while.  It has been 4 days.  He is taking supplemental shakes.  I encouraged him to keep using those.  He can do dysphagia 1/full liquids and MiraLAX.  Short-term anticoagulation such as heparin drip as discussed by neurology.  Will defer to medicine primary service.  Okay to stay on aspirin from our standpoint perioperatively.  Hopefully can get back on oral full anticoagulation within 24-48 hours after surgery if hemoglobin stable and surgery goes well.  His risk of perioperative stroke are obviously increased but he has been on Brilinta for at least 5 weeks.  Neurology feels 6 weeks is more ideal but understands in this bleeding situation it is what it is.    -VTE prophylaxis- SCDs, etc  -mobilize as tolerated to help recovery        30 minutes spent in review, evaluation, examination, counseling, and coordination of care.   I have reviewed this patient's available data, including medical history, events of note, physical examination and test results as part of my evaluation.  A significant portion of that time was spent in counseling.  Care during the described time interval was provided by me.  01/22/2021    Subjective: (Chief complaint)  Patient denies abdominal pain.  No significant bleeding.  Has many questions about risks of surgery.  Trying to wrap his head around this.  recent left ICA and left MCA/M2 stroke (thought vessel to vessel embolism from high-grade left ICA stenosis) s/p mechanical thrombectomy and rescue left ICA stenting, mild residual right-sided weakness and dysarthria, hypertension, hyperlipidemia, tobacco use who presents to the ED for evaluation of BRBPR.  As above, patient recently admitted 12/13/2020-12/16/2020 for right-sided weakness and aphasia due to to left ICA and left MCA/M2 stroke.  He was found to have high-grade left ICA stenosis and underwent mechanical thrombectomy followed by rescue left ICA stenting.  He was started on  aspirin and Brilinta and ultimately discharged to inpatient rehab.  Objective:  Vital signs:  Vitals:   01/21/21 0532 01/21/21 1324 01/21/21 2054 01/22/21 0607  BP: 132/70 118/87 98/63 107/66  Pulse: 84 96 80 79  Resp: 16 18 16 16   Temp:  98.2 F (36.8 C) 98.7 F (37.1 C) (!) 97.5 F (36.4 C)  TempSrc:  Oral Oral Oral  SpO2: 99% 99% 99% 97%  Weight:      Height:        Last BM Date: 01/19/21  Intake/Output   Yesterday:  04/08 0701 - 04/09 0700 In: 1440 [P.O.:1440] Out: 1400 [Urine:1400] This shift:  No intake/output data recorded.  Bowel function:  Flatus: YES  BM:  YES  Drain: (No drain)   Physical Exam:  General: Pt awake/alert in no acute distress Eyes: PERRL, normal EOM.  Sclera clear.  No icterus Neuro: CN II-XII intact w/o focal sensory/motor deficits.  Somewhat stoic and slow in speech but no definite expressive aphasia/aphrenia. Lymph: No head/neck/groin lymphadenopathy Psych:  No delerium/psychosis/paranoia.  Oriented x 4 HENT: Normocephalic, Mucus membranes moist.  No thrush Neck: Supple, No tracheal deviation.  No obvious thyromegaly Chest: No pain to chest wall compression.  Good respiratory excursion.  No audible wheezing CV:  Pulses intact.  Regular rhythm.  No major extremity edema  MS: Normal AROM mjr joints.  No obvious deformity  Abdomen: Soft.  Nondistended.  Nontender.  No evidence of peritonitis.  No incarcerated hernias.  Ext:   No deformity.  No mjr edema.  No cyanosis Skin: No petechiae / purpurea.  No major sores.  Warm and dry    Results:   Cultures: Recent Results (from the past 720 hour(s))  Resp Panel by RT-PCR (Flu A&B, Covid) Nasopharyngeal Swab     Status: None   Collection Time: 01/17/21  3:47 PM   Specimen: Nasopharyngeal Swab; Nasopharyngeal(NP) swabs in vial transport medium  Result Value Ref Range Status   SARS Coronavirus 2 by RT PCR NEGATIVE NEGATIVE Final    Comment: (NOTE) SARS-CoV-2 target nucleic acids are  NOT DETECTED.  The SARS-CoV-2 RNA is generally detectable in upper respiratory specimens during the acute phase of infection. The lowest concentration of SARS-CoV-2 viral copies this assay can detect is 138 copies/mL. A negative result does not preclude SARS-Cov-2 infection and should not be used as the sole basis for treatment or other patient management decisions. A negative result may occur with  improper specimen collection/handling, submission of specimen other than nasopharyngeal swab, presence of viral mutation(s) within the areas targeted by this assay, and inadequate number of viral copies(<138 copies/mL). A negative result must be combined with clinical observations, patient history, and epidemiological information. The expected result is Negative.  Fact Sheet for Patients:  EntrepreneurPulse.com.au  Fact Sheet for Healthcare Providers:  IncredibleEmployment.be  This test is no t yet approved or cleared by the [place] FDA and  has been authorized for detection and/or diagnosis of SARS-CoV-2 by FDA under an Emergency Use Authorization (EUA). This EUA will remain  in effect (meaning this test can be used) for the duration of the COVID-19 declaration under Section 564(b)(1) of the Act, 21 U.S.C.section 360bbb-3(b)(1), unless the authorization is terminated  or revoked sooner.       Influenza A by PCR NEGATIVE NEGATIVE Final   Influenza B by PCR NEGATIVE NEGATIVE Final    Comment: (NOTE) The Xpert Xpress SARS-CoV-2/FLU/RSV plus assay is intended as an aid in the diagnosis of influenza from Nasopharyngeal swab specimens and should not be used as a sole basis for treatment. Nasal washings and aspirates are unacceptable for Xpert Xpress SARS-CoV-2/FLU/RSV testing.  Fact Sheet for Patients: EntrepreneurPulse.com.au  Fact Sheet for Healthcare Providers: IncredibleEmployment.be  This test is not  yet approved or cleared by the [place] FDA and has been authorized for detection and/or diagnosis of SARS-CoV-2 by FDA under an Emergency Use Authorization (EUA). This EUA will remain in effect (meaning this test can be used) for the duration of the COVID-19 declaration under Section 564(b)(1) of the Act, 21 U.S.C. section 360bbb-3(b)(1), unless the authorization is terminated or revoked.  Performed at [facility], [address] [place], [place] [address]     Labs: Results for orders placed or performed during the hospital encounter of 01/17/21 (from the past 48 hour(s))  Surgical pathology     Status: None   Collection Time: 01/20/21 12:39 PM  Result Value Ref Range   SURGICAL PATHOLOGY      SURGICAL PATHOLOGY CASE: [id number] PATIENT: Dustin Golden Surgical Pathology Report     Clinical History: Melena, anemia, negative endoscopy ([facility])     FINAL MICROSCOPIC DIAGNOSIS:  A. 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  Saralyn Pilar reviewed the case and concurs with the diagnosis.  Dr. Paulita Fujita was notified on 01/21/2021.      Vedanth Sirico DESCRIPTION:  Received in formalin are pink-gray soft tissue fragments that are submitted in toto. Number: 7.  Size: 0.1 to 0.4 cm.  Blocks: 1.  SW 01/20/2021   Final Diagnosis performed by Jaquita Folds, MD.   Electronically signed 01/21/2021 Technical and / or Professional components performed at [facility], [place] [address] [place], [place] [address].  Immunohistochemistry Technical component (if applicable) was performed at [facility]. [address],  [place], [place], [place] [address].   IMMUNOHISTOCHEMISTRY DISCLAIMER (if applicable): Some of these immunohistochemical stains may have been developed and the performance characteristics determine by  [facility]. Some may not have been cleared or approved by the U.S. Food and Drug Administration. The FDA has determined that such clearance or approval is not necessary. This test is used for clinical purposes. It should not be regarded as investigational or for research. This laboratory is certified under the [place] (CLIA-88) as qualified to perform high complexity clinical laboratory testing.  The controls stained appropriately.   Type and screen [place]     Status: None   Collection Time: 01/21/21 10:48 AM  Result Value Ref Range   ABO/RH(D) O POS    Antibody Screen NEG    Sample Expiration      01/24/2021,2359 Performed at [facility], [place] [address] [place], [place] [address]   ABO/Rh     Status: None   Collection Time: 01/21/21 10:52 AM  Result Value Ref Range   ABO/RH(D)      O POS Performed at [facility], [place] [address] [place], [place] [address]   CBC     Status: Abnormal   Collection Time: 01/22/21  7:45 AM  Result Value Ref Range   WBC 5.8 4.0 - 10.5 K/uL   RBC 4.06 (L) 4.22 - 5.81 MIL/uL   Hemoglobin 10.8 (L) 13.0 - 17.0 g/dL   HCT 32.6 (L) 39.0 - 52.0 %   MCV 80.3 80.0 - 100.0 fL   MCH 26.6 26.0 - 34.0 pg   MCHC 33.1 30.0 - 36.0 g/dL   RDW 13.8 11.5 - 15.5 %   Platelets 257 150 - 400 K/uL   nRBC 0.0 0.0 - 0.2 %    Comment: Performed at [facility], [place] [address] [place], [place] [address]    Imaging / Studies: CT CHEST W CONTRAST  Result Date: 01/20/2021 CLINICAL DATA:  Inpatient. Colorectal cancer staging. Large ulcerated partially obstructing mass found in the cecum on colonoscopy today. EXAM: CT CHEST, ABDOMEN, AND PELVIS WITH CONTRAST TECHNIQUE: Multidetector CT imaging of the chest, abdomen and pelvis was performed following the standard protocol during bolus administration of intravenous contrast. CONTRAST:   1103mL OMNIPAQUE IOHEXOL 300 MG/ML  SOLN COMPARISON:  None. FINDINGS: CT CHEST FINDINGS Cardiovascular: Normal heart size. No significant pericardial effusion/thickening. Three-vessel coronary atherosclerosis. Atherosclerotic nonaneurysmal thoracic aorta. Normal caliber pulmonary arteries. No central pulmonary emboli. Mediastinum/Nodes: No discrete thyroid nodules. Unremarkable esophagus. No pathologically enlarged axillary, mediastinal or hilar lymph nodes. Lungs/Pleura: No pneumothorax. No pleural effusion. No acute consolidative airspace disease or lung masses. Three scattered small solid subpleural right pulmonary nodules, largest 3 mm in the right middle lobe (series 4/image 131). Musculoskeletal:  No aggressive appearing focal osseous lesions. CT ABDOMEN PELVIS FINDINGS Hepatobiliary: Normal liver with no liver  mass. Normal gallbladder with no radiopaque cholelithiasis. No biliary ductal dilatation. Pancreas: Normal, with no mass or duct dilation. Spleen: Normal size. No mass. Adrenals/Urinary Tract: Normal adrenals. Normal kidneys with no hydronephrosis and no renal mass. Normal bladder. Stomach/Bowel: Normal non-distended stomach. Normal caliber small bowel with no small bowel wall thickening. Oral contrast transits to the transverse colon. Appendectomy. Irregular solid 4.5 x 2.9 x 3.2 cm polypoid mass in the cecum (series 2/image 101). Moderate diffuse colonic diverticulosis with no additional sites of definite large bowel wall thickening and no significant pericolonic fat stranding. Vascular/Lymphatic: Atherosclerotic abdominal aorta with dilated 2.8 cm infrarenal abdominal aorta. Patent portal, splenic, hepatic and renal veins. Small pericecal lymph nodes measure up to 0.4 cm short axis diameter (series 2/image 101). No pathologically enlarged lymph nodes in the abdomen or pelvis. Reproductive: Mildly enlarged prostate. Other: No pneumoperitoneum, ascites or focal fluid collection. Musculoskeletal: No  aggressive appearing focal osseous lesions. Moderate lumbar spondylosis. IMPRESSION: 1. Irregular solid 4.5 x 2.9 x 3.2 cm polypoid mass in the cecum, compatible with primary cecal malignancy. 2. Small pericecal lymph nodes measure up to 0.4 cm short axis diameter, nonspecific. 3. No findings highly suspicious for metastatic disease in the chest, abdomen or pelvis. No liver masses. 4. Three scattered small solid subpleural right pulmonary nodules, largest 3 mm, probably benign. Initial follow-up chest CT suggested in 3 months. 5. Moderate diffuse colonic diverticulosis. 6. Mildly enlarged prostate. 7. Aortic Atherosclerosis (ICD10-I70.0). Electronically Signed   By: Ilona Sorrel M.D.   On: 01/20/2021 20:12   CT ABDOMEN PELVIS W CONTRAST  Result Date: 01/20/2021 CLINICAL DATA:  Inpatient. Colorectal cancer staging. Large ulcerated partially obstructing mass found in the cecum on colonoscopy today. EXAM: CT CHEST, ABDOMEN, AND PELVIS WITH CONTRAST TECHNIQUE: Multidetector CT imaging of the chest, abdomen and pelvis was performed following the standard protocol during bolus administration of intravenous contrast. CONTRAST:  168mL OMNIPAQUE IOHEXOL 300 MG/ML  SOLN COMPARISON:  None. FINDINGS: CT CHEST FINDINGS Cardiovascular: Normal heart size. No significant pericardial effusion/thickening. Three-vessel coronary atherosclerosis. Atherosclerotic nonaneurysmal thoracic aorta. Normal caliber pulmonary arteries. No central pulmonary emboli. Mediastinum/Nodes: No discrete thyroid nodules. Unremarkable esophagus. No pathologically enlarged axillary, mediastinal or hilar lymph nodes. Lungs/Pleura: No pneumothorax. No pleural effusion. No acute consolidative airspace disease or lung masses. Three scattered small solid subpleural right pulmonary nodules, largest 3 mm in the right middle lobe (series 4/image 131). Musculoskeletal:  No aggressive appearing focal osseous lesions. CT ABDOMEN PELVIS FINDINGS Hepatobiliary: Normal  liver with no liver mass. Normal gallbladder with no radiopaque cholelithiasis. No biliary ductal dilatation. Pancreas: Normal, with no mass or duct dilation. Spleen: Normal size. No mass. Adrenals/Urinary Tract: Normal adrenals. Normal kidneys with no hydronephrosis and no renal mass. Normal bladder. Stomach/Bowel: Normal non-distended stomach. Normal caliber small bowel with no small bowel wall thickening. Oral contrast transits to the transverse colon. Appendectomy. Irregular solid 4.5 x 2.9 x 3.2 cm polypoid mass in the cecum (series 2/image 101). Moderate diffuse colonic diverticulosis with no additional sites of definite large bowel wall thickening and no significant pericolonic fat stranding. Vascular/Lymphatic: Atherosclerotic abdominal aorta with dilated 2.8 cm infrarenal abdominal aorta. Patent portal, splenic, hepatic and renal veins. Small pericecal lymph nodes measure up to 0.4 cm short axis diameter (series 2/image 101). No pathologically enlarged lymph nodes in the abdomen or pelvis. Reproductive: Mildly enlarged prostate. Other: No pneumoperitoneum, ascites or focal fluid collection. Musculoskeletal: No aggressive appearing focal osseous lesions. Moderate lumbar spondylosis. IMPRESSION: 1. Irregular solid 4.5 x 2.9 x  3.2 cm polypoid mass in the cecum, compatible with primary cecal malignancy. 2. Small pericecal lymph nodes measure up to 0.4 cm short axis diameter, nonspecific. 3. No findings highly suspicious for metastatic disease in the chest, abdomen or pelvis. No liver masses. 4. Three scattered small solid subpleural right pulmonary nodules, largest 3 mm, probably benign. Initial follow-up chest CT suggested in 3 months. 5. Moderate diffuse colonic diverticulosis. 6. Mildly enlarged prostate. 7. Aortic Atherosclerosis (ICD10-I70.0). Electronically Signed   By: Ilona Sorrel M.D.   On: 01/20/2021 20:12    Medications / Allergies: per chart  Antibiotics: Anti-infectives (From admission,  onward)   Start     Dose/Rate Route Frequency Ordered Stop   01/23/21 1400  neomycin (MYCIFRADIN) tablet 1,000 mg       "And" Linked Group Details   1,000 mg Oral 3 times per day 01/22/21 0821 01/24/21 1359   01/23/21 1400  metroNIDAZOLE (FLAGYL) tablet 1,000 mg       "And" Linked Group Details   1,000 mg Oral 3 times per day 01/22/21 0821 01/24/21 1359   01/22/21 0915  cefoTEtan (CEFOTAN) 2 g in sodium chloride 0.9 % 100 mL IVPB        2 g 200 mL/hr over 30 Minutes Intravenous On call to O.R. 01/22/21 1937 01/23/21 0559        Note: Portions of this report may have been transcribed using voice recognition software. Every effort was made to ensure accuracy; however, inadvertent computerized transcription errors may be present.   Any transcriptional errors that result from this process are unintentional.    Adin Hector, MD, FACS, MASCRS  Esophageal, Gastrointestinal & Colorectal Surgery Robotic and Minimally Invasive Surgery [facility] [address] [place], [place] [address] [phone] Fax [phone] Main/Paging  CONTACT INFORMATION: Weekday (9AM-5PM) concerns: Call [facility] main office at [phone] Weeknight (5PM-9AM) or Weekend/Holiday concerns: Check [email or web address] for General Surgery [facility] coverage (Please, do not use SecureChat as it is not reliable communication to operating surgeons for immediate patient care)      01/22/2021  8:22 AM

## 2021-01-22 NOTE — Progress Notes (Signed)
[place] for Heparin Indication: stroke  Allergies  Allergen Reactions  . Ibuprofen Other (See Comments)    Nosebleeds   . Sulfa Antibiotics Rash and Other (See Comments)    "Blisters to skin with cream"  . Sulfamethoxazole Rash and Other (See Comments)    "Blisters to skin with cream"   Patient Measurements: Height: 6\' 3"  (190.5 cm) Weight: 87.2 kg (192 lb 3.9 oz) IBW/kg (Calculated) : 84.5 Heparin Dosing Weight: 87 kg  Vital Signs: Temp: 97.5 F (36.4 C) (04/09 0607) Temp Source: Oral (04/09 0607) BP: 107/66 (04/09 0607) Pulse Rate: 79 (04/09 0607)  Labs: Recent Labs    01/22/21 0745  HGB 10.8*  HCT 32.6*  PLT 257   Estimated Creatinine Clearance: 71.9 mL/min (by C-G formula based on SCr of 1.24 mg/dL).  Medical History: Past Medical History:  Diagnosis Date  . Hypertension   . Stroke [facility])    Medications:  Scheduled:  . [START ON 01/24/2021] acetaminophen  1,000 mg Oral On Call to OR  . [START ON 01/24/2021] alvimopan  12 mg Oral On Call to OR  . amLODipine  10 mg Oral Daily  . aspirin EC  81 mg Oral Daily  . atorvastatin  20 mg Oral Daily  . [START ON 01/24/2021] bupivacaine liposome  20 mL Infiltration Once  . feeding supplement  1 Container Oral TID WC  . [START ON 01/24/2021] feeding supplement  296 mL Oral Once  . [START ON 01/23/2021] feeding supplement  592 mL Oral Once  . folic acid  1 mg Oral Daily  . [START ON 01/24/2021] gabapentin  300 mg Oral On Call to OR  . lisinopril  2.5 mg Oral Daily  . [START ON 01/23/2021] neomycin  1,000 mg Oral 3 times per day   And  . [START ON 01/23/2021] metroNIDAZOLE  1,000 mg Oral 3 times per day  . pantoprazole  40 mg Oral BID  . polyethylene glycol  17 g Oral BID   Infusions:  . [START ON 01/24/2021] cefoTEtan (CEFOTAN) IV     Assessment: Admit with GI bleed, on Dual Antiplatelet therapy PTA (ASA - resumed & Ticagrelor held on admit).  Colonoscopy 4/4 noted cecal mass,  biopsied - plan colon resection 4/11, begin Heparin infusion for bridging DAPT for recent CVA w/ L ICA stent  H/H low-stable, Plt wnl  Goal of Therapy:  Heparin level 0.3-0.7 units/ml Monitor platelets by anticoagulation protocol: Yes   Plan:  Continue ASA per MD note Begin Heparin infusion at 1200 units/hr, no load Check 6 hr Heparin level Daily CBC, begin daily Heparin level at steady state Note Planned surgery for 4/11, MD to determine Heparin stop time prior to surgery  Minda Ditto PharmD 01/22/2021,11:25 AM

## 2021-01-22 NOTE — Progress Notes (Signed)
[place] for Heparin Indication: stroke  Allergies  Allergen Reactions  . Ibuprofen Other (See Comments)    Nosebleeds   . Sulfa Antibiotics Rash and Other (See Comments)    "Blisters to skin with cream"  . Sulfamethoxazole Rash and Other (See Comments)    "Blisters to skin with cream"   Patient Measurements: Height: 6\' 3"  (190.5 cm) Weight: 87.2 kg (192 lb 3.9 oz) IBW/kg (Calculated) : 84.5 Heparin Dosing Weight: 87 kg  Vital Signs: Temp: 97.6 F (36.4 C) (04/09 1351) Temp Source: Oral (04/09 1351) BP: 111/69 (04/09 1351) Pulse Rate: 91 (04/09 1351)  Labs: Recent Labs    01/22/21 0745 01/22/21 2041  HGB 10.8*  --   HCT 32.6*  --   PLT 257  --   HEPARINUNFRC  --  0.18*   Estimated Creatinine Clearance: 71.9 mL/min (by C-G formula based on SCr of 1.24 mg/dL).  Medical History: Past Medical History:  Diagnosis Date  . Hypertension   . Stroke [facility])    Medications:  Scheduled:  . [START ON 01/24/2021] acetaminophen  1,000 mg Oral On Call to OR  . [START ON 01/24/2021] alvimopan  12 mg Oral On Call to OR  . amLODipine  10 mg Oral Daily  . aspirin EC  81 mg Oral Daily  . atorvastatin  20 mg Oral Daily  . [START ON 01/24/2021] bupivacaine liposome  20 mL Infiltration Once  . feeding supplement  1 Container Oral TID WC  . [START ON 01/24/2021] feeding supplement  296 mL Oral Once  . [START ON 01/23/2021] feeding supplement  592 mL Oral Once  . folic acid  1 mg Oral Daily  . [START ON 01/24/2021] gabapentin  300 mg Oral On Call to OR  . lisinopril  2.5 mg Oral Daily  . [START ON 01/23/2021] neomycin  1,000 mg Oral 3 times per day   And  . [START ON 01/23/2021] metroNIDAZOLE  1,000 mg Oral 3 times per day  . pantoprazole  40 mg Oral BID  . polyethylene glycol  17 g Oral BID   Infusions:  . [START ON 01/24/2021] cefoTEtan (CEFOTAN) IV    . heparin 1,200 Units/hr (01/22/21 1303)   Assessment: Admit with GI bleed, on Dual  Antiplatelet therapy PTA (ASA - resumed & Ticagrelor held on admit).  Colonoscopy 4/4 noted cecal mass, biopsied - plan colon resection 4/11, begin Heparin infusion for bridging DAPT for recent CVA w/ L ICA stent  H/H low-stable, Plt wnl  2041 HL 0.18 sub-therapeutic Per RN heparin off for 5-10 minutes around 2000 tonight No bleeding noted  Goal of Therapy:  Heparin level 0.3-0.7 units/ml Monitor platelets by anticoagulation protocol: Yes   Plan:  Continue ASA per MD note Increase heparin to 1400 units/hr Check 6 hr Heparin level Daily CBC, begin daily Heparin level at steady state Note Planned surgery for 4/11, MD to determine Heparin stop time prior to surgery  Dolly Rias RPh 01/22/2021, 9:31 PM

## 2021-01-23 DIAGNOSIS — K922 Gastrointestinal hemorrhage, unspecified: Secondary | ICD-10-CM | POA: Diagnosis not present

## 2021-01-23 DIAGNOSIS — I10 Essential (primary) hypertension: Secondary | ICD-10-CM | POA: Diagnosis not present

## 2021-01-23 DIAGNOSIS — K921 Melena: Secondary | ICD-10-CM | POA: Diagnosis not present

## 2021-01-23 DIAGNOSIS — C182 Malignant neoplasm of ascending colon: Secondary | ICD-10-CM | POA: Diagnosis not present

## 2021-01-23 LAB — CBC
HCT: 31.1 % — ABNORMAL LOW (ref 39.0–52.0)
Hemoglobin: 10.1 g/dL — ABNORMAL LOW (ref 13.0–17.0)
MCH: 26 pg (ref 26.0–34.0)
MCHC: 32.5 g/dL (ref 30.0–36.0)
MCV: 80.2 fL (ref 80.0–100.0)
Platelets: 252 10*3/uL (ref 150–400)
RBC: 3.88 MIL/uL — ABNORMAL LOW (ref 4.22–5.81)
RDW: 13.6 % (ref 11.5–15.5)
WBC: 5.5 10*3/uL (ref 4.0–10.5)
nRBC: 0 % (ref 0.0–0.2)

## 2021-01-23 LAB — HEPARIN LEVEL (UNFRACTIONATED): Heparin Unfractionated: 0.84 IU/mL — ABNORMAL HIGH (ref 0.30–0.70)

## 2021-01-23 NOTE — Consult Note (Addendum)
[place] Nurse ostomy consult note  [place] Nurse requested for preoperative stoma site marking by Dr. Johney Maine.  Discussed surgical procedure and stoma creation with patient and family.  Explained role of the [place] nurse team.  Answered patient and family questions.   Examined patient lying, sitting, and standing in order to place the marking in the patient's visual field, away from any creases or abdominal contour issues and within the rectus muscle.  Patient with large scarred area from long ago surgery in RLQ.  Second choice: Marked for ileostomy in the RLQ  4.5cm to the right of the umbilicus and  1cm below the umbilicus.  While initially this location might be complicated by its proximity to the previously scarred tissue, a flat pouching surface may be able to be obtained.  First choice: Marked for ileostomy in the RLQ  5cm to the right of the umbilicus and  5cm above the umbilicus. While on the abdomen and with some peristomal creasing, this is the first choice for a stoma siting.   Patient's abdomen cleansed with CHG wipes at site markings, allowed to air dry prior to marking. Covered marks with thin film transparent dressing to preserve until date of surgery (tomorrow, 01/24/21).   [place] Nurse will be available for post operative education and support if a stoma is created intraoperatively.  Please reconsult if that is the case.   Thanks, Maudie Flakes, MSN, RN, [place], Arther Abbott  Pager# [phone]

## 2021-01-23 NOTE — Progress Notes (Signed)
[place] for Heparin Indication: stroke  Allergies  Allergen Reactions  . Ibuprofen Other (See Comments)    Nosebleeds   . Sulfa Antibiotics Rash and Other (See Comments)    "Blisters to skin with cream"  . Sulfamethoxazole Rash and Other (See Comments)    "Blisters to skin with cream"   Patient Measurements: Height: 6\' 3"  (190.5 cm) Weight: 87.2 kg (192 lb 3.9 oz) IBW/kg (Calculated) : 84.5 Heparin Dosing Weight: 87 kg  Vital Signs: Temp: 98.2 F (36.8 C) (04/10 0500) Temp Source: Oral (04/10 0500) BP: 126/82 (04/10 0500) Pulse Rate: 72 (04/10 0500)  Labs: Recent Labs    01/22/21 0745 01/22/21 2041 01/23/21 0448  HGB 10.8*  --  10.1*  HCT 32.6*  --  31.1*  PLT 257  --  252  HEPARINUNFRC  --  0.18* 0.84*   Estimated Creatinine Clearance: 71.9 mL/min (by C-G formula based on SCr of 1.24 mg/dL).  Medical History: Past Medical History:  Diagnosis Date  . Hypertension   . Stroke [facility])    Medications:  Scheduled:  . [START ON 01/24/2021] acetaminophen  1,000 mg Oral On Call to OR  . [START ON 01/24/2021] alvimopan  12 mg Oral On Call to OR  . amLODipine  10 mg Oral Daily  . aspirin EC  81 mg Oral Daily  . atorvastatin  20 mg Oral Daily  . [START ON 01/24/2021] bupivacaine liposome  20 mL Infiltration Once  . feeding supplement  1 Container Oral TID WC  . [START ON 01/24/2021] feeding supplement  296 mL Oral Once  . feeding supplement  592 mL Oral Once  . folic acid  1 mg Oral Daily  . [START ON 01/24/2021] gabapentin  300 mg Oral On Call to OR  . lisinopril  2.5 mg Oral Daily  . neomycin  1,000 mg Oral 3 times per day   And  . metroNIDAZOLE  1,000 mg Oral 3 times per day  . pantoprazole  40 mg Oral BID  . polyethylene glycol  17 g Oral BID   Infusions:  . [START ON 01/24/2021] cefoTEtan (CEFOTAN) IV    . heparin 1,400 Units/hr (01/22/21 2157)   Assessment: Admit with GI bleed, on Dual Antiplatelet therapy PTA (ASA -  resumed & Ticagrelor held on admit).  Colonoscopy 4/4 noted cecal mass, biopsied - plan colon resection 4/11, begin Heparin infusion for bridging DAPT for recent CVA w/ L ICA stent  H/H low-stable, Plt wnl  01/23/2021 HL 0.84 supra-therapeutic on 1400 units/hr No bleeding or line interruptions per RN Hgb 10.1, Plts WNL  Goal of Therapy:  Heparin level 0.3-0.7 units/ml Monitor platelets by anticoagulation protocol: Yes   Plan:  Continue ASA per MD note Decrease heparin to 1250 units/hr Check 6 hr Heparin level Daily CBC, begin daily Heparin level at steady state Note Planned surgery for 4/11, MD to determine Heparin stop time prior to surgery  Seba Dalkai 01/23/2021, 6:39 AM

## 2021-01-23 NOTE — Progress Notes (Signed)
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  EGD on 4/6 with mild erythematous mucosa in duodenum.  Colonoscopy on 4/7 with ulcerated cecal mass.  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  Pathology with high-grade dysplasia.  General surgery consulted and planning surgery on 4/11.  Patient is off DAPT.  Neurology recommended bridging with IV heparin.  Subjective: Seen and examined earlier this morning.  No major events overnight or this morning.  Somewhat anxious and worried about the upcoming surgery and leaving the hospital alive.  Seems to have skepticism about modern medicine.  He does not like taking medications.  He denies chest pain, shortness of breath, nausea, vomiting or abdominal pain.  Objective: Vitals:   01/22/21 2152 01/23/21 0150 01/23/21 0500 01/23/21 1257  BP: 103/68 103/76 126/82 121/84  Pulse: 78 69 72 81  Resp: 18 18 18 18   Temp: 97.7 F (36.5 C) 98.4 F (36.9 C) 98.2 F (36.8 C) 98.4 F (36.9 C)  TempSrc: Oral Oral Oral   SpO2: 98% 98% 99%   Weight:      Height:        Intake/Output Summary (Last 24 hours) at 01/23/2021 1310 Last data filed at 01/23/2021 1114 Gross per 24 hour  Intake 1335.32 ml  Output 2150 ml  Net -814.68 ml   Filed Weights   01/17/21 1352 01/20/21 1135  Weight: 87.2 kg 87.2 kg    Examination:  GENERAL: No apparent distress.  Nontoxic. HEENT: MMM.  Vision and hearing grossly intact.  NECK: Supple.  No apparent JVD.  RESP: On room air.  No IWOB.  Fair aeration  bilaterally. CVS:  RRR. Heart sounds normal.  ABD/GI/GU: BS+. Abd soft, NTND.  MSK/EXT:  Moves extremities. No apparent deformity. No edema.  SKIN: no apparent skin lesion or wound NEURO: Awake, alert and oriented appropriately.  Some dysarthria.  RUE strength is 4/5  PSYCH: Calm. Normal affect.   Procedures:  4/6-EGD with erythematous mucosa and duodenum 4/7-colonoscopy with fungating mass in cecum.  Pathology with high-grade dysplasia.  Microbiology summarized: COVID-19 and influenza PCR nonreactive.  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 -General surgery following-plan for surgical intervention early next week -Holding Brilinta.  Okay to continue aspirin per surgery.  Discontinued IV heparin. -Patient seems to be optimized for surgery from cardiopulmonary standpoint.  Recent TTE reassuring.  Hematochezia: Likely due to the above.  Relatively stable Recent Labs    12/13/20 0151 12/13/20 0200 12/14/20 0412 12/15/20 0235 12/20/20 0439 01/17/21 1444 01/18/21 0506 01/19/21 0415 01/22/21 0745 01/23/21 0448  HGB 14.4 15.0 12.6* 12.2* 12.5* 11.2* 10.8* 10.4* 10.8* 10.1*  -Monitor H&H  Recent left ACA and MCA/M2 stroke s/p left ICA mechanical thrombectomy followed by rescue stenting with mid residual upper and lower extremity weakness and dysarthria: Stable. -Holding Brilinta for upcoming surgery.  We will resume as soon as possible after surgery. -Okay to continue aspirin per surgery. -Discontinue IV heparin -PT/OT  Essential hypertension: Normotensive  -continue amlodipine and  lisinopril.  Hyperlipidemia: Stable -Continue statins.  Concerns about medications: Patient was started on Seroquel with as needed Haldol and Klonopin.  There is no documentation about the indication for these medications although Klonopin stays as needed for seizure.  Patient is questioning as to why these medications  were prescribed to him.  Not sure if patient had some agitation or delirium after GI procedures.  -Discontinue Seroquel, as needed Haldol and Klonopin  Body mass index is 24.03 kg/m.         DVT prophylaxis:  SCD's Start: 01/22/21 0818 SCDs Start: 01/17/21 2046 Patient is also on IV heparin. Code Status: Full code Family Communication: Patient and/or RN. Available if any question.  Level of care: Med-Surg Status is: Inpatient  Remains inpatient appropriate because:Ongoing diagnostic testing needed not appropriate for outpatient work up, IV treatments appropriate due to intensity of illness or inability to take PO and Inpatient level of care appropriate due to severity of illness   Dispo: The patient is from: Home              Anticipated d/c is to: Home              Patient currently is not medically stable to d/c.   Difficult to place patient No       Consultants:  Gastroenterology Neurology General surgery   Sch Meds:  Scheduled Meds: . [START ON 01/24/2021] acetaminophen  1,000 mg Oral On Call to OR  . [START ON 01/24/2021] alvimopan  12 mg Oral On Call to OR  . amLODipine  10 mg Oral Daily  . aspirin EC  81 mg Oral Daily  . atorvastatin  20 mg Oral Daily  . [START ON 01/24/2021] bupivacaine liposome  20 mL Infiltration Once  . feeding supplement  1 Container Oral TID WC  . [START ON 01/24/2021] feeding supplement  296 mL Oral Once  . feeding supplement  592 mL Oral Once  . folic acid  1 mg Oral Daily  . [START ON 01/24/2021] gabapentin  300 mg Oral On Call to OR  . lisinopril  2.5 mg Oral Daily  . neomycin  1,000 mg Oral 3 times per day   And  . metroNIDAZOLE  1,000 mg Oral 3 times per day  . pantoprazole  40 mg Oral BID  . polyethylene glycol  17 g Oral BID   Continuous Infusions: . [START ON 01/24/2021] cefoTEtan (CEFOTAN) IV     PRN Meds:.acetaminophen **OR** acetaminophen, ondansetron **OR** ondansetron (ZOFRAN) IV  Antimicrobials: Anti-infectives  (From admission, onward)   Start     Dose/Rate Route Frequency Ordered Stop   01/24/21 0600  cefoTEtan (CEFOTAN) 2 g in sodium chloride 0.9 % 100 mL IVPB        2 g 200 mL/hr over 30 Minutes Intravenous On call to O.R. 01/22/21 2409 01/25/21 0559   01/23/21 1400  neomycin (MYCIFRADIN) tablet 1,000 mg       "And" Linked Group Details   1,000 mg Oral 3 times per day 01/22/21 0821 01/24/21 1359   01/23/21 1400  metroNIDAZOLE (FLAGYL) tablet 1,000 mg       "And" Linked Group Details   1,000 mg Oral 3 times per day 01/22/21 0821 01/24/21 1359       I have personally reviewed the following labs and images: CBC: Recent Labs  Lab 01/17/21 1444 01/18/21 0506 01/19/21 0415 01/22/21 0745 01/23/21 0448  WBC 7.5 7.2 5.6 5.8 5.5  HGB 11.2* 10.8* 10.4* 10.8* 10.1*  HCT 33.4* 33.0* 31.6* 32.6* 31.1*  MCV 78.6* 79.9* 80.0 80.3 80.2  PLT 238 220 211 257 252   BMP &GFR Recent Labs  Lab 01/17/21 1444 01/18/21 0506  NA 134* 139  K 3.8 3.9  CL 100 104  CO2 25 24  GLUCOSE 98 100*  BUN 13 12  CREATININE 1.01 1.24  CALCIUM 9.3 9.4   Estimated Creatinine Clearance: 71.9 mL/min (by C-G formula based on SCr of 1.24 mg/dL). Liver & Pancreas: Recent Labs  Lab 01/17/21 1444  AST 20  ALT 19  ALKPHOS 84  BILITOT 0.6  PROT 8.0  ALBUMIN 4.5   No results for input(s): LIPASE, AMYLASE in the last 168 hours. No results for input(s): AMMONIA in the last 168 hours. Diabetic: No results for input(s): HGBA1C in the last 72 hours. No results for input(s): GLUCAP in the last 168 hours. Cardiac Enzymes: No results for input(s): CKTOTAL, CKMB, CKMBINDEX, TROPONINI in the last 168 hours. No results for input(s): PROBNP in the last 8760 hours. Coagulation Profile: Recent Labs  Lab 01/17/21 1444  INR 0.9   Thyroid Function Tests: No results for input(s): TSH, T4TOTAL, FREET4, T3FREE, THYROIDAB in the last 72 hours. Lipid Profile: No results for input(s): CHOL, HDL, LDLCALC, TRIG, CHOLHDL,  LDLDIRECT in the last 72 hours. Anemia Panel: No results for input(s): VITAMINB12, FOLATE, FERRITIN, TIBC, IRON, RETICCTPCT in the last 72 hours. Urine analysis:    Component Value Date/Time   COLORURINE STRAW (A) 12/13/2020 1245   APPEARANCEUR CLEAR 12/13/2020 1245   LABSPEC 1.019 12/13/2020 1245   PHURINE 7.0 12/13/2020 1245   GLUCOSEU NEGATIVE 12/13/2020 1245   HGBUR NEGATIVE 12/13/2020 1245   [place] 12/13/2020 1245   [place] 12/13/2020 1245   PROTEINUR NEGATIVE 12/13/2020 1245   NITRITE NEGATIVE 12/13/2020 1245   LEUKOCYTESUR NEGATIVE 12/13/2020 1245   Sepsis Labs: Invalid input(s): PROCALCITONIN, [place]  Microbiology: Recent Results (from the past 240 hour(s))  Resp Panel by RT-PCR (Flu A&B, Covid) Nasopharyngeal Swab     Status: None   Collection Time: 01/17/21  3:47 PM   Specimen: Nasopharyngeal Swab; Nasopharyngeal(NP) swabs in vial transport medium  Result Value Ref Range Status   SARS Coronavirus 2 by RT PCR NEGATIVE NEGATIVE Final    Comment: (NOTE) SARS-CoV-2 target nucleic acids are NOT DETECTED.  The SARS-CoV-2 RNA is generally detectable in upper respiratory specimens during the acute phase of infection. The lowest concentration of SARS-CoV-2 viral copies this assay can detect is 138 copies/mL. A negative result does not preclude SARS-Cov-2 infection and should not be used as the sole basis for treatment or other patient management decisions. A negative result may occur with  improper specimen collection/handling, submission of specimen other than nasopharyngeal swab, presence of viral mutation(s) within the areas targeted by this assay, and inadequate number of viral copies(<138 copies/mL). A negative result must be combined with clinical observations, patient history, and epidemiological information. The expected result is Negative.  Fact Sheet for Patients:  EntrepreneurPulse.com.au  Fact Sheet for  Healthcare Providers:  IncredibleEmployment.be  This test is no t yet approved or cleared by the [place] FDA and  has been authorized for detection and/or diagnosis of SARS-CoV-2 by FDA under an Emergency Use Authorization (EUA). This EUA will remain  in effect (meaning this test can be used) for the duration of the COVID-19 declaration under Section 564(b)(1) of the Act, 21 U.S.C.section 360bbb-3(b)(1), unless the authorization is terminated  or revoked sooner.  Influenza A by PCR NEGATIVE NEGATIVE Final   Influenza B by PCR NEGATIVE NEGATIVE Final    Comment: (NOTE) The Xpert Xpress SARS-CoV-2/FLU/RSV plus assay is intended as an aid in the diagnosis of influenza from Nasopharyngeal swab specimens and should not be used as a sole basis for treatment. Nasal washings and aspirates are unacceptable for Xpert Xpress SARS-CoV-2/FLU/RSV testing.  Fact Sheet for Patients: EntrepreneurPulse.com.au  Fact Sheet for Healthcare Providers: IncredibleEmployment.be  This test is not yet approved or cleared by the [place] FDA and has been authorized for detection and/or diagnosis of SARS-CoV-2 by FDA under an Emergency Use Authorization (EUA). This EUA will remain in effect (meaning this test can be used) for the duration of the COVID-19 declaration under Section 564(b)(1) of the Act, 21 U.S.C. section 360bbb-3(b)(1), unless the authorization is terminated or revoked.  Performed at [facility], [address], [place], [place] [address]     Radiology Studies: No results found.    Zaveon Gillen T. [place]  If 7PM-7AM, please contact night-coverage [email or web address] 01/23/2021, 1:10 PM

## 2021-01-23 NOTE — Progress Notes (Signed)
ANTICOAGULATION CONSULT NOTE  - Follow Up  Pharmacy Consult for Heparin Indication: stroke  Allergies  Allergen Reactions  . Ibuprofen Other (See Comments)    Nosebleeds   . Sulfa Antibiotics Rash and Other (See Comments)    "Blisters to skin with cream"  . Sulfamethoxazole Rash and Other (See Comments)    "Blisters to skin with cream"   Patient Measurements: Height: 6\' 3"  (190.5 cm) Weight: 87.2 kg (192 lb 3.9 oz) IBW/kg (Calculated) : 84.5 Heparin Dosing Weight: 87 kg  Vital Signs: Temp: 98.2 F (36.8 C) (04/10 0500) Temp Source: Oral (04/10 0500) BP: 126/82 (04/10 0500) Pulse Rate: 72 (04/10 0500)  Labs: Recent Labs    01/22/21 0745 01/22/21 2041 01/23/21 0448  HGB 10.8*  --  10.1*  HCT 32.6*  --  31.1*  PLT 257  --  252  HEPARINUNFRC  --  0.18* 0.84*   Estimated Creatinine Clearance: 71.9 mL/min (by C-G formula based on SCr of 1.24 mg/dL).  Medical History: Past Medical History:  Diagnosis Date  . Hypertension   . Stroke [facility])    Medications:  Scheduled:  . [START ON 01/24/2021] acetaminophen  1,000 mg Oral On Call to OR  . [START ON 01/24/2021] alvimopan  12 mg Oral On Call to OR  . amLODipine  10 mg Oral Daily  . aspirin EC  81 mg Oral Daily  . atorvastatin  20 mg Oral Daily  . [START ON 01/24/2021] bupivacaine liposome  20 mL Infiltration Once  . feeding supplement  1 Container Oral TID WC  . [START ON 01/24/2021] feeding supplement  296 mL Oral Once  . feeding supplement  592 mL Oral Once  . folic acid  1 mg Oral Daily  . [START ON 01/24/2021] gabapentin  300 mg Oral On Call to OR  . lisinopril  2.5 mg Oral Daily  . neomycin  1,000 mg Oral 3 times per day   And  . metroNIDAZOLE  1,000 mg Oral 3 times per day  . pantoprazole  40 mg Oral BID  . polyethylene glycol  17 g Oral BID   Infusions:  . [START ON 01/24/2021] cefoTEtan (CEFOTAN) IV     Assessment: Pt is a 58 yoM admitted with BRBPR and GIB. PMH significant for recent CVA w/left ICA stenting  on 12/13/20 currently prescribed ASA + ticagrelor for DAPT.  GI consulted: Pt underwent EGD 4/6, colonoscopy 4/7 revealed ulcerated cecal mass. CCS consulted, planning for surgery right colectomy to remove cecal mass on 4/11.   DAPT management: -Ticagrelor held on admission  -ASA continued on admission -IV UFH initiated on 4/9, ASA 81 mg discontinued (dose given 4/9 AM)  Plan per neuro recommendation was to bridge peri-operatively with IV UFH while antiplatelets held.   Patient currently on heparin infusion for perioperative bridging. Per CCS note 4/10 "Okay to stay on aspirin from our standpoint perioperatively - I reordered."   Goal of Therapy:  Heparin level 0.3-0.7 units/ml Monitor platelets by anticoagulation protocol: Yes   Plan:  Discussed with attending, heparin drip discontinued since ASA resumed.   Pharmacy to sign off.  Lenis Noon PharmD 01/23/2021,10:34 AM

## 2021-01-23 NOTE — Progress Notes (Addendum)
Dustin Golden [id number] 09-Oct-1956  CARE TEAM:  PCP: Patient, No Pcp Per (Inactive)  Outpatient Care Team: Patient Care Team: Patient, No Pcp Per (Inactive) as PCP - General (General Practice) Dorothy Spark, MD as Consulting Physician (Cardiology) Garvin Fila, MD as Consulting Physician (Neurology)  Inpatient Treatment Team: Treatment Team: Attending Provider: Mercy Riding, MD; Rounding Team: Fatima Blank, MD; Consulting Physician: Arta Silence, MD; Consulting Physician: Ronald Lobo, MD; Consulting Physician: Nolon Nations, MD; Consulting Physician: Amie Portland, MD; Technician: Ernest Mallick, NT; Utilization Review: Alease Medina, RN; Registered Nurse: Tanda Rockers, RN; Consulting Physician: Dorothy Spark, MD   Problem List:   Principal Problem:   GIB (gastrointestinal bleeding) Active Problems:   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   Left middle cerebral artery stroke [facility])   Essential hypertension   Hyperlipidemia   Acute GI bleeding   3 Days Post-Op  01/17/2021 - 01/20/2021  Procedure(s): COLONOSCOPY WITH PROPOFOL BIOPSY  SURGICAL PATHOLOGY  CASE: [id number]  PATIENT: Dustin Golden  Surgical Pathology Report      Clinical History: Melena, anemia, negative endoscopy ([facility])      FINAL MICROSCOPIC DIAGNOSIS:   A. 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 Saralyn Pilar reviewed the case and concurs with the diagnosis. Dr.  Paulita Fujita was notified on 01/21/2021.       Caio Devera DESCRIPTION:   Received in formalin are pink-gray soft tissue fragments that are  submitted in toto. Number: 7. Size: 0.1 to 0.4 cm. Blocks: 1.   SW 01/20/2021    Final Diagnosis performed by Jaquita Folds, MD.  Electronically  signed 01/21/2021  Technical and / or Professional components performed  at [facility], [place] [address], [place], [place] [address].  Immunohistochemistry Technical component (if applicable) was performed  at [facility]. [address], [place],  [place], [place] [address].  IMMUNOHISTOCHEMISTRY DISCLAIMER (if applicable):  Some of these immunohistochemical stains may have been developed and the  performance characteristics determine by [facility] Some  may not have been cleared or approved by the U.S. Food and Drug  Administration. The FDA has determined that such clearance or approval  is not necessary. This test is used for clinical purposes. It should not  be regarded as investigational or for research. This laboratory is  certified under the [employer]  (CLIA-88) as qualified to perform high complexity clinical laboratory  testing. The controls stained appropriately.   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  Need for anticoagulation given recent stroke and stenting.  Normally on Brilinta.  Last dose 4/4.  [facility] Stay = 4 days)  Plan:  Plan from segmental proximal "right" colectomy to remove the cecal tumor that is probably cancer.  Plan resection with immediate anastomosis since he is not in shock or perforated.  Ideally would allow his oral anticoagulation to wear off beyond 5-7 days.    Patient posted the patient for Monday morning for Dr. [name] to do minimally invasive approach.  The anatomy & physiology of the digestive tract was discussed.  The pathophysiology of the colon was discussed.  Natural history risks without surgery was discussed.   I feel the risks of no intervention will lead to serious problems that outweigh the  operative risks; therefore, I recommended a partial colectomy to remove the pathology.  Minimally invasive (Robotic/Laparoscopic) & open techniques were discussed.    Risks such as bleeding, infection, abscess, leak, reoperation, injury to other organs, need for repair of tissues / organs, possible ostomy, hernia, heart attack, stroke, death, and other risks were discussed.  I noted a good likelihood this will help address the problem.   Goals of post-operative recovery were discussed as well.   Need for adequate nutrition, daily bowel regimen and healthy physical activity, to optimize recovery was noted as well. We will work to minimize complications.  Educational materials were available as well.  Questions were answered.  The patient expresses understanding & wishes to proceed with surgery.  Discussed with patient.  He told me he felt ready to proceed with surgery tomorrow.  Appears to be medically cleared & not felt by [facility] that formal cardiac clearance is needed.  Patient had echocardiogram with good function in February and has decent performance status.  As optimized    As he can be.   He is worried about being malnourished since he has not had solid food for a while.  It has been 4 days.  He is taking supplemental shakes.  Albumin 4.5 on admit argues against major malnutrition. I encouraged him to keep using those.  I tried to reassure him. Go back to clear liquids with supplemental shakes for the next 23 hours and bowel prep gentle with MiraLAX only.  Oral antibiotics as well.  Short-term anticoagulation such as heparin drip as discussed by neurology.  Need to hold heparin 6 hours preop = hold for a.m. tomorrow morning 4/11 0400 for surgery anticipated around 10 AM.  Will defer to medicine primary service.  Okay to stay on aspirin from our standpoint perioperatively - I reordered.    Hopefully can get back on oral full anticoagulation within 24-48 hours after surgery if hemoglobin stable and surgery goes well.  His risk of perioperative stroke are obviously increased but he has been on Brilinta for at least 5 weeks.  Neurology feels 6 weeks is more ideal but  understands in this bleeding situation it is what it is.    -VTE prophylaxis- SCDs, etc  -mobilize as tolerated to help recovery        30 minutes spent in review, evaluation, examination, counseling, and coordination of care.   I have reviewed this patient's available data, including medical history, events of note, physical examination and test results as part of my evaluation.  A significant portion of that time was spent in counseling.  Care during the described time interval was provided by me.  01/23/2021    Subjective: (Chief complaint)  Patient denies abdominal pain.  No significant bleeding.  Has many questions about risks of surgery.  Trying to wrap his head around this.  recent left ICA and left MCA/M2 stroke (thought vessel to vessel embolism from high-grade left ICA stenosis) s/p mechanical thrombectomy and rescue left ICA stenting, mild residual right-sided weakness and dysarthria, hypertension, hyperlipidemia, tobacco use who presents to the ED for evaluation of BRBPR.  As above, patient recently admitted 12/13/2020-12/16/2020 for right-sided weakness and aphasia due to to left ICA and left MCA/M2 stroke.  He was found to have high-grade left ICA stenosis and underwent mechanical thrombectomy followed by rescue left ICA stenting.  He was started on aspirin and Brilinta and ultimately discharged to inpatient rehab.  Objective:  Vital signs:  Vitals:   01/22/21 1351 01/22/21  2152 01/23/21 0150 01/23/21 0500  BP: 111/69 103/68 103/76 126/82  Pulse: 91 78 69 72  Resp: 18 18 18 18   Temp: 97.6 F (36.4 C) 97.7 F (36.5 C) 98.4 F (36.9 C) 98.2 F (36.8 C)  TempSrc: Oral Oral Oral Oral  SpO2: 100% 98% 98% 99%  Weight:      Height:        Last BM Date: 01/19/21  Intake/Output   Yesterday:  04/09 0701 - 04/10 0700 In: 1215.3 [P.O.:1000; I.V.:215.3] Out: 1450 [Urine:1450] This shift:  Total I/O In: -  Out: 200 [Urine:200]  Bowel function:  Flatus:  YES  BM:  YES  Drain: (No drain)   Physical Exam:  General: Pt awake/alert in no acute distress Eyes: PERRL, normal EOM.  Sclera clear.  No icterus Neuro: CN II-XII intact w/o focal sensory/motor deficits.  Somewhat stoic and slow in speech but no definite expressive aphasia/aphrenia. Lymph: No head/neck/groin lymphadenopathy Psych:  No delerium/psychosis/paranoia.  Oriented x 4 HENT: Normocephalic, Mucus membranes moist.  No thrush Neck: Supple, No tracheal deviation.  No obvious thyromegaly Chest: No pain to chest wall compression.  Good respiratory excursion.  No audible wheezing CV:  Pulses intact.  Regular rhythm.  No major extremity edema MS: Normal AROM mjr joints.  No obvious deformity  Abdomen: Soft.  Nondistended.  Nontender.  No evidence of peritonitis.  No incarcerated hernias.  Ext:   No deformity.  No mjr edema.  No cyanosis Skin: No petechiae / purpurea.  No major sores.  Warm and dry    Results:   Cultures: Recent Results (from the past 720 hour(s))  Resp Panel by RT-PCR (Flu A&B, Covid) Nasopharyngeal Swab     Status: None   Collection Time: 01/17/21  3:47 PM   Specimen: Nasopharyngeal Swab; Nasopharyngeal(NP) swabs in vial transport medium  Result Value Ref Range Status   SARS Coronavirus 2 by RT PCR NEGATIVE NEGATIVE Final    Comment: (NOTE) SARS-CoV-2 target nucleic acids are NOT DETECTED.  The SARS-CoV-2 RNA is generally detectable in upper respiratory specimens during the acute phase of infection. The lowest concentration of SARS-CoV-2 viral copies this assay can detect is 138 copies/mL. A negative result does not preclude SARS-Cov-2 infection and should not be used as the sole basis for treatment or other patient management decisions. A negative result may occur with  improper specimen collection/handling, submission of specimen other than nasopharyngeal swab, presence of viral mutation(s) within the areas targeted by this assay, and inadequate  number of viral copies(<138 copies/mL). A negative result must be combined with clinical observations, patient history, and epidemiological information. The expected result is Negative.  Fact Sheet for Patients:  EntrepreneurPulse.com.au  Fact Sheet for Healthcare Providers:  IncredibleEmployment.be  This test is no t yet approved or cleared by the [place] FDA and  has been authorized for detection and/or diagnosis of SARS-CoV-2 by FDA under an Emergency Use Authorization (EUA). This EUA will remain  in effect (meaning this test can be used) for the duration of the COVID-19 declaration under Section 564(b)(1) of the Act, 21 U.S.C.section 360bbb-3(b)(1), unless the authorization is terminated  or revoked sooner.       Influenza A by PCR NEGATIVE NEGATIVE Final   Influenza B by PCR NEGATIVE NEGATIVE Final    Comment: (NOTE) The Xpert Xpress SARS-CoV-2/FLU/RSV plus assay is intended as an aid in the diagnosis of influenza from Nasopharyngeal swab specimens and should not be used as a sole basis for treatment. Nasal washings  and aspirates are unacceptable for Xpert Xpress SARS-CoV-2/FLU/RSV testing.  Fact Sheet for Patients: EntrepreneurPulse.com.au  Fact Sheet for Healthcare Providers: IncredibleEmployment.be  This test is not yet approved or cleared by the [place] FDA and has been authorized for detection and/or diagnosis of SARS-CoV-2 by FDA under an Emergency Use Authorization (EUA). This EUA will remain in effect (meaning this test can be used) for the duration of the COVID-19 declaration under Section 564(b)(1) of the Act, 21 U.S.C. section 360bbb-3(b)(1), unless the authorization is terminated or revoked.  Performed at [facility], [address] [place], [place] [address]     Labs: Results for orders placed or performed during the hospital encounter of 01/17/21 (from  the past 48 hour(s))  Type and screen [place]     Status: None   Collection Time: 01/21/21 10:48 AM  Result Value Ref Range   ABO/RH(D) O POS    Antibody Screen NEG    Sample Expiration      01/24/2021,2359 Performed at [facility], [place] [address], [place], [place] [address]   ABO/Rh     Status: None   Collection Time: 01/21/21 10:52 AM  Result Value Ref Range   ABO/RH(D)      O POS Performed at [facility], [place] [address] [place], [place] [address]   CBC     Status: Abnormal   Collection Time: 01/22/21  7:45 AM  Result Value Ref Range   WBC 5.8 4.0 - 10.5 K/uL   RBC 4.06 (L) 4.22 - 5.81 MIL/uL   Hemoglobin 10.8 (L) 13.0 - 17.0 g/dL   HCT 32.6 (L) 39.0 - 52.0 %   MCV 80.3 80.0 - 100.0 fL   MCH 26.6 26.0 - 34.0 pg   MCHC 33.1 30.0 - 36.0 g/dL   RDW 13.8 11.5 - 15.5 %   Platelets 257 150 - 400 K/uL   nRBC 0.0 0.0 - 0.2 %    Comment: Performed at [facility], [place] [address], [place], [place] [address]  Heparin level (unfractionated)     Status: Abnormal   Collection Time: 01/22/21  8:41 PM  Result Value Ref Range   Heparin Unfractionated 0.18 (L) 0.30 - 0.70 IU/mL    Comment: (NOTE) If heparin results are below expected values, and patient dosage has  been confirmed, suggest follow up testing of antithrombin III levels. Performed at [facility], [place] [address], [place], [place] [address]   Heparin level (unfractionated)     Status: Abnormal   Collection Time: 01/23/21  4:48 AM  Result Value Ref Range   Heparin Unfractionated 0.84 (H) 0.30 - 0.70 IU/mL    Comment: (NOTE) If heparin results are below expected values, and patient dosage has  been confirmed, suggest follow up testing of antithrombin III levels. Performed at [facility], [place] [address], [place], [place] [address]   CBC     Status: Abnormal   Collection Time: 01/23/21  4:48 AM  Result  Value Ref Range   WBC 5.5 4.0 - 10.5 K/uL   RBC 3.88 (L) 4.22 - 5.81 MIL/uL   Hemoglobin 10.1 (L) 13.0 - 17.0 g/dL   HCT 31.1 (L) 39.0 - 52.0 %   MCV 80.2 80.0 - 100.0 fL   MCH 26.0 26.0 - 34.0 pg   MCHC 32.5 30.0 - 36.0 g/dL   RDW 13.6 11.5 - 15.5 %   Platelets 252 150 - 400 K/uL  nRBC 0.0 0.0 - 0.2 %    Comment: Performed at [facility], [place] [address], [place], [place] [address]    Imaging / Studies: No results found.  Medications / Allergies: per chart  Antibiotics: Anti-infectives (From admission, onward)   Start     Dose/Rate Route Frequency Ordered Stop   01/24/21 0600  cefoTEtan (CEFOTAN) 2 g in sodium chloride 0.9 % 100 mL IVPB        2 g 200 mL/hr over 30 Minutes Intravenous On call to O.R. 01/22/21 6045 01/25/21 0559   01/23/21 1400  neomycin (MYCIFRADIN) tablet 1,000 mg       "And" Linked Group Details   1,000 mg Oral 3 times per day 01/22/21 0821 01/24/21 1359   01/23/21 1400  metroNIDAZOLE (FLAGYL) tablet 1,000 mg       "And" Linked Group Details   1,000 mg Oral 3 times per day 01/22/21 4098 01/24/21 1359        Note: Portions of this report may have been transcribed using voice recognition software. Every effort was made to ensure accuracy; however, inadvertent computerized transcription errors may be present.   Any transcriptional errors that result from this process are unintentional.    Adin Hector, MD, FACS, MASCRS  Esophageal, Gastrointestinal & Colorectal Surgery Robotic and Minimally Invasive Surgery [facility] [address] [place], [place] [phone] Fax [phone] Main/Paging  CONTACT INFORMATION: Weekday (9AM-5PM) concerns: Call [facility] main office at [phone] Weeknight (5PM-9AM) or Weekend/Holiday concerns: Check [email or web address] for General Surgery [facility] coverage (Please, do not use SecureChat as it is not reliable communication to operating surgeons for immediate  patient care)      01/23/2021  8:38 AM

## 2021-01-24 ENCOUNTER — Encounter (HOSPITAL_COMMUNITY): Payer: Self-pay | Admitting: Gastroenterology

## 2021-01-24 DIAGNOSIS — K921 Melena: Secondary | ICD-10-CM | POA: Diagnosis not present

## 2021-01-24 DIAGNOSIS — I10 Essential (primary) hypertension: Secondary | ICD-10-CM | POA: Diagnosis not present

## 2021-01-24 DIAGNOSIS — C182 Malignant neoplasm of ascending colon: Secondary | ICD-10-CM | POA: Diagnosis not present

## 2021-01-24 DIAGNOSIS — K922 Gastrointestinal hemorrhage, unspecified: Secondary | ICD-10-CM | POA: Diagnosis not present

## 2021-01-24 LAB — HEMOGLOBIN AND HEMATOCRIT, BLOOD
HCT: 31.6 % — ABNORMAL LOW (ref 39.0–52.0)
Hemoglobin: 10.5 g/dL — ABNORMAL LOW (ref 13.0–17.0)

## 2021-01-24 LAB — SURGICAL PCR SCREEN
MRSA, PCR: NEGATIVE
Staphylococcus aureus: NEGATIVE

## 2021-01-24 MED ORDER — SODIUM CHLORIDE 0.9 % IV SOLN
2.0000 g | INTRAVENOUS | Status: AC
  Administered 2021-01-25: 2 g via INTRAVENOUS
  Filled 2021-01-24 (×2): qty 2

## 2021-01-24 MED ORDER — ALVIMOPAN 12 MG PO CAPS
12.0000 mg | ORAL_CAPSULE | ORAL | Status: AC
  Filled 2021-01-24: qty 1

## 2021-01-24 MED ORDER — HEPARIN (PORCINE) 25000 UT/250ML-% IV SOLN
1250.0000 [IU]/h | INTRAVENOUS | Status: DC
  Filled 2021-01-24: qty 250

## 2021-01-24 NOTE — Progress Notes (Signed)
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  EGD on 4/6 with mild erythematous mucosa in duodenum.  Colonoscopy on 4/7 with ulcerated cecal mass.  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  Pathology with high-grade dysplasia.  Surgery postponed to 4/12.  Brilinta on hold.  Subjective: Seen and examined earlier this morning.  He is upset about his surgery being postponed.  Otherwise, no complaints.  Objective: Vitals:   01/23/21 0500 01/23/21 1257 01/23/21 2130 01/24/21 0450  BP: 126/82 121/84 115/71 121/75  Pulse: 72 81 72 71  Resp: 18 18 18 18   Temp: 98.2 F (36.8 C) 98.4 F (36.9 C) 98.4 F (36.9 C) (!) 97.5 F (36.4 C)  TempSrc: Oral  Oral Oral  SpO2: 99%  98% 98%  Weight:      Height:        Intake/Output Summary (Last 24 hours) at 01/24/2021 1054 Last data filed at 01/24/2021 1000 Gross per 24 hour  Intake 3040 ml  Output 2000 ml  Net 1040 ml   Filed Weights   01/17/21 1352 01/20/21 1135  Weight: 87.2 kg 87.2 kg    Examination:  GENERAL: No apparent distress.  Nontoxic. HEENT: MMM.  Vision and hearing grossly intact.  NECK: Supple.  No apparent JVD.  RESP: On RA.  No IWOB.  Fair aeration bilaterally. CVS:  RRR. Heart sounds normal.  ABD/GI/GU: BS+. Abd soft, NTND.  MSK/EXT:  Moves extremities. No apparent deformity. No edema.  SKIN: no apparent skin lesion or wound NEURO: Awake, alert and oriented appropriately.  Some dysarthria.  RUE strength is 4/5.  Ambulating in the hallway. PSYCH: Calm.  Normal affect.  Procedures:  4/6-EGD with erythematous mucosa and duodenum 4/7-colonoscopy with fungating mass in cecum.  Pathology with high-grade dysplasia.  Microbiology summarized: COVID-19 and influenza PCR nonreactive.  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 -General surgery following-surgery postponed to/12/22. -Continue holding Brilinta.  Okay to continue aspirin per surgery.  Discontinued IV heparin. -Patient seems to be optimized for surgery from cardiopulmonary standpoint.  Recent TTE reassuring.  Hematochezia: Likely due to the above.  Relatively stable Recent Labs    12/13/20 0200 12/14/20 0412 12/15/20 0235 12/20/20 0439 01/17/21 1444 01/18/21 0506 01/19/21 0415 01/22/21 0745 01/23/21 0448 01/24/21 0430  HGB 15.0 12.6* 12.2* 12.5* 11.2* 10.8* 10.4* 10.8* 10.1* 10.5*  -Monitor H&H  Recent left ACA and MCA/M2 stroke s/p left ICA mechanical thrombectomy followed by rescue stenting with mid residual upper and lower extremity weakness and dysarthria: Stable. -Holding Brilinta for upcoming surgery.  We will resume as soon as possible after surgery. -Okay to continue aspirin per surgery. -Discontinued IV heparin -Patient ambulating independently.  Essential hypertension: Normotensive  -continue amlodipine and lisinopril.  Hyperlipidemia: Stable -Continue statins.  Concerns about medications: started on Seroquel and as needed Haldol and Klonopin.  The indication was not clear nor documented.  Patient was upset.  Seroquel, Haldol and Klonopin discontinued.  Has been stable off those medications.  Body mass  index is 24.03 kg/m.         DVT prophylaxis:  SCD's Start: 01/22/21 0818 SCDs Start: 01/17/21 2046 Patient is also on IV heparin. Code Status: Full code Family Communication: Patient and/or RN. Available if any question.  Level of care: Med-Surg Status is:  Inpatient  Remains inpatient appropriate because:Ongoing diagnostic testing needed not appropriate for outpatient work up, IV treatments appropriate due to intensity of illness or inability to take PO and Inpatient level of care appropriate due to severity of illness   Dispo: The patient is from: Home              Anticipated d/c is to: Home              Patient currently is not medically stable to d/c.   Difficult to place patient No       Consultants:  Gastroenterology-signed off Neurology over the phone General surgery   Sch Meds:  Scheduled Meds: . acetaminophen  1,000 mg Oral On Call to OR  . alvimopan  12 mg Oral On Call to OR  . amLODipine  10 mg Oral Daily  . aspirin EC  81 mg Oral Daily  . atorvastatin  20 mg Oral Daily  . bupivacaine liposome  20 mL Infiltration Once  . feeding supplement  1 Container Oral TID WC  . folic acid  1 mg Oral Daily  . gabapentin  300 mg Oral On Call to OR  . lisinopril  2.5 mg Oral Daily  . pantoprazole  40 mg Oral BID  . polyethylene glycol  17 g Oral BID   Continuous Infusions: . cefoTEtan (CEFOTAN) IV     PRN Meds:.acetaminophen **OR** acetaminophen, ondansetron **OR** ondansetron (ZOFRAN) IV  Antimicrobials: Anti-infectives (From admission, onward)   Start     Dose/Rate Route Frequency Ordered Stop   01/24/21 0600  cefoTEtan (CEFOTAN) 2 g in sodium chloride 0.9 % 100 mL IVPB        2 g 200 mL/hr over 30 Minutes Intravenous On call to O.R. 01/22/21 7425 01/25/21 0559   01/23/21 1400  neomycin (MYCIFRADIN) tablet 1,000 mg       "And" Linked Group Details   1,000 mg Oral 3 times per day 01/22/21 0821 01/23/21 2233   01/23/21 1400  metroNIDAZOLE (FLAGYL) tablet 1,000 mg       "And" Linked Group Details   1,000 mg Oral 3 times per day 01/22/21 0821 01/23/21 2233       I have personally reviewed the following labs and images: CBC: Recent Labs  Lab 01/17/21 1444 01/18/21 0506 01/19/21 0415 01/22/21 0745 01/23/21 0448  01/24/21 0430  WBC 7.5 7.2 5.6 5.8 5.5  --   HGB 11.2* 10.8* 10.4* 10.8* 10.1* 10.5*  HCT 33.4* 33.0* 31.6* 32.6* 31.1* 31.6*  MCV 78.6* 79.9* 80.0 80.3 80.2  --   PLT 238 220 211 257 252  --    BMP &GFR Recent Labs  Lab 01/17/21 1444 01/18/21 0506  NA 134* 139  K 3.8 3.9  CL 100 104  CO2 25 24  GLUCOSE 98 100*  BUN 13 12  CREATININE 1.01 1.24  CALCIUM 9.3 9.4   Estimated Creatinine Clearance: 71.9 mL/min (by C-G formula based on SCr of 1.24 mg/dL). Liver & Pancreas: Recent Labs  Lab 01/17/21 1444  AST 20  ALT 19  ALKPHOS 84  BILITOT 0.6  PROT 8.0  ALBUMIN 4.5   No results for input(s): LIPASE, AMYLASE in the last 168 hours.  No results for input(s): AMMONIA in the last 168 hours. Diabetic: No results for input(s): HGBA1C in the last 72 hours. No results for input(s): GLUCAP in the last 168 hours. Cardiac Enzymes: No results for input(s): CKTOTAL, CKMB, CKMBINDEX, TROPONINI in the last 168 hours. No results for input(s): PROBNP in the last 8760 hours. Coagulation Profile: Recent Labs  Lab 01/17/21 1444  INR 0.9   Thyroid Function Tests: No results for input(s): TSH, T4TOTAL, FREET4, T3FREE, THYROIDAB in the last 72 hours. Lipid Profile: No results for input(s): CHOL, HDL, LDLCALC, TRIG, CHOLHDL, LDLDIRECT in the last 72 hours. Anemia Panel: No results for input(s): VITAMINB12, FOLATE, FERRITIN, TIBC, IRON, RETICCTPCT in the last 72 hours. Urine analysis:    Component Value Date/Time   COLORURINE STRAW (A) 12/13/2020 1245   APPEARANCEUR CLEAR 12/13/2020 1245   LABSPEC 1.019 12/13/2020 1245   PHURINE 7.0 12/13/2020 1245   GLUCOSEU NEGATIVE 12/13/2020 1245   HGBUR NEGATIVE 12/13/2020 1245   [place] 12/13/2020 1245   [place] 12/13/2020 1245   PROTEINUR NEGATIVE 12/13/2020 1245   NITRITE NEGATIVE 12/13/2020 1245   LEUKOCYTESUR NEGATIVE 12/13/2020 1245   Sepsis Labs: Invalid input(s): PROCALCITONIN,  [place]  Microbiology: Recent Results (from the past 240 hour(s))  Resp Panel by RT-PCR (Flu A&B, Covid) Nasopharyngeal Swab     Status: None   Collection Time: 01/17/21  3:47 PM   Specimen: Nasopharyngeal Swab; Nasopharyngeal(NP) swabs in vial transport medium  Result Value Ref Range Status   SARS Coronavirus 2 by RT PCR NEGATIVE NEGATIVE Final    Comment: (NOTE) SARS-CoV-2 target nucleic acids are NOT DETECTED.  The SARS-CoV-2 RNA is generally detectable in upper respiratory specimens during the acute phase of infection. The lowest concentration of SARS-CoV-2 viral copies this assay can detect is 138 copies/mL. A negative result does not preclude SARS-Cov-2 infection and should not be used as the sole basis for treatment or other patient management decisions. A negative result may occur with  improper specimen collection/handling, submission of specimen other than nasopharyngeal swab, presence of viral mutation(s) within the areas targeted by this assay, and inadequate number of viral copies(<138 copies/mL). A negative result must be combined with clinical observations, patient history, and epidemiological information. The expected result is Negative.  Fact Sheet for Patients:  EntrepreneurPulse.com.au  Fact Sheet for Healthcare Providers:  IncredibleEmployment.be  This test is no t yet approved or cleared by the [place] FDA and  has been authorized for detection and/or diagnosis of SARS-CoV-2 by FDA under an Emergency Use Authorization (EUA). This EUA will remain  in effect (meaning this test can be used) for the duration of the COVID-19 declaration under Section 564(b)(1) of the Act, 21 U.S.C.section 360bbb-3(b)(1), unless the authorization is terminated  or revoked sooner.       Influenza A by PCR NEGATIVE NEGATIVE Final   Influenza B by PCR NEGATIVE NEGATIVE Final    Comment: (NOTE) The Xpert Xpress SARS-CoV-2/FLU/RSV plus  assay is intended as an aid in the diagnosis of influenza from Nasopharyngeal swab specimens and should not be used as a sole basis for treatment. Nasal washings and aspirates are unacceptable for Xpert Xpress SARS-CoV-2/FLU/RSV testing.  Fact Sheet for Patients: EntrepreneurPulse.com.au  Fact Sheet for Healthcare Providers: IncredibleEmployment.be  This test is not yet approved or cleared by the [place] FDA and has been authorized for detection and/or diagnosis of SARS-CoV-2 by FDA under an Emergency Use Authorization (EUA). This EUA will remain in effect (meaning this test can be used)  for the duration of the COVID-19 declaration under Section 564(b)(1) of the Act, 21 U.S.C. section 360bbb-3(b)(1), unless the authorization is terminated or revoked.  Performed at [facility], [address] [place], [place] [address]   Surgical pcr screen     Status: None   Collection Time: 01/24/21  2:56 AM   Specimen: Nasal Mucosa; Nasal Swab  Result Value Ref Range Status   MRSA, PCR NEGATIVE NEGATIVE Final   Staphylococcus aureus NEGATIVE NEGATIVE Final    Comment: (NOTE) The Xpert SA Assay (FDA approved for NASAL specimens in patients 68 years of age and older), is one component of a comprehensive surveillance program. It is not intended to diagnose infection nor to guide or monitor treatment. Performed at [facility], [place] [address], [place], [place] [address]     Radiology Studies: No results found.    Dustin Golden T. [place]  If 7PM-7AM, please contact night-coverage [email or web address] 01/24/2021, 10:54 AM

## 2021-01-24 NOTE — Progress Notes (Signed)
4 Days Post-Op   Subjective/Chief Complaint: Pt with no acute changes Lots of questions this AM   Objective: Vital signs in last 24 hours: Temp:  [97.5 F (36.4 C)-98.4 F (36.9 C)] 97.5 F (36.4 C) (04/11 0450) Pulse Rate:  [71-81] 71 (04/11 0450) Resp:  [18] 18 (04/11 0450) BP: (115-121)/(71-84) 121/75 (04/11 0450) SpO2:  [98 %] 98 % (04/11 0450) Last BM Date: 01/19/21  Intake/Output from previous day: 04/10 0701 - 04/11 0700 In: 3280 [P.O.:3280] Out: 2000 [Urine:2000] Intake/Output this shift: No intake/output data recorded.  PE:  Constitutional: No acute distress, conversant, appears states age. Eyes: Anicteric sclerae, moist conjunctiva, no lid lag Lungs: Clear to auscultation bilaterally, normal respiratory effort CV: regular rate and rhythm, no murmurs, no peripheral edema, pedal pulses 2+ GI: Soft, no masses or hepatosplenomegaly, non-tender to palpation,RLQ incsion Skin: No rashes, palpation reveals normal turgor Psychiatric: appropriate judgment and insight, oriented to person, place, and time   Lab Results:  Recent Labs    01/22/21 0745 01/23/21 0448 01/24/21 0430  WBC 5.8 5.5  --   HGB 10.8* 10.1* 10.5*  HCT 32.6* 31.1* 31.6*  PLT 257 252  --    Anti-infectives: Anti-infectives (From admission, onward)   Start     Dose/Rate Route Frequency Ordered Stop   01/24/21 0600  cefoTEtan (CEFOTAN) 2 g in sodium chloride 0.9 % 100 mL IVPB        2 g 200 mL/hr over 30 Minutes Intravenous On call to O.R. 01/22/21 0037 01/25/21 0559   01/23/21 1400  neomycin (MYCIFRADIN) tablet 1,000 mg       "And" Linked Group Details   1,000 mg Oral 3 times per day 01/22/21 0821 01/23/21 2233   01/23/21 1400  metroNIDAZOLE (FLAGYL) tablet 1,000 mg       "And" Linked Group Details   1,000 mg Oral 3 times per day 01/22/21 0488 01/23/21 2233      Assessment/Plan: 23M cecal mass LGIB, anemia CVA  -I had a long d/w the pt with regard to his colonoscopy finding.  He is  agreeable to proceeding with surgery at this time.  D/t the surgery schedule, we will plan on scheduling tomorrow.  OK for clears for now -I d/w him the risks and benefits of the procedure to include but not limited to: infection, bleeding, damage to surrounding structures, stroke, possible need for ostomy, and possible need for further surgery.  Patient voiced understanding and wishes to proceed.  LOS: 5 days    Ralene Ok 01/24/2021

## 2021-01-24 NOTE — Progress Notes (Signed)
Pharmacy note regarding heparin infusion:  Pharmacy consulted for heparin infusion this morning.  PMH significant for recent CVA w/left ICA stenting on 12/13/20 currently prescribed only antiplatelet agents (ASA + ticagrelor for dual antiplatelet therapy), no PTA anticoagulation. Patient is currently on aspirin which was restarted on 01/21/21.  Clarified with [facility] to discontinue heparin infusion since patient currently on aspirin.  Kino Dunsworth P. Legrand Como, PharmD, [place] Please utilize Amion for appropriate phone number to reach the unit pharmacist ([place] [place]) 01/24/2021 10:09 AM

## 2021-01-24 NOTE — Progress Notes (Deleted)
ANTICOAGULATION CONSULT NOTE - Initial Consult  Pharmacy Consult for heparin infusion Indication: stroke  Allergies  Allergen Reactions  . Ibuprofen Other (See Comments)    Nosebleeds   . Sulfa Antibiotics Rash and Other (See Comments)    "Blisters to skin with cream"  . Sulfamethoxazole Rash and Other (See Comments)    "Blisters to skin with cream"    Patient Measurements: Height: 6\' 3"  (190.5 cm) Weight: 87.2 kg (192 lb 3.9 oz) IBW/kg (Calculated) : 84.5 Heparin Dosing Weight: 87.2 kg  Vital Signs: Temp: 97.5 F (36.4 C) (04/11 0450) Temp Source: Oral (04/11 0450) BP: 121/75 (04/11 0450) Pulse Rate: 71 (04/11 0450)  Labs: Recent Labs    01/22/21 0745 01/22/21 2041 01/23/21 0448 01/24/21 0430  HGB 10.8*  --  10.1* 10.5*  HCT 32.6*  --  31.1* 31.6*  PLT 257  --  252  --   HEPARINUNFRC  --  0.18* 0.84*  --     Estimated Creatinine Clearance: 71.9 mL/min (by C-G formula based on SCr of 1.24 mg/dL).   Medical History: Past Medical History:  Diagnosis Date  . Hypertension   . Stroke [facility])     Medications:  Currently on aspirin EC tablet 81 mg po daily Previously on heparin infusion 4/9-4/10, rate was at 1250 units/hr when discontinued yesterday  Assessment: Pharmacy to dose heparin infusion for CVA s/p thrombectomy and stents, off Brilinta/ASA for surgery which has been rescheduled for 4/12.  Aspirin restarted 4/9 per surgery who noted, "Okay to stay on aspirin from our standpoint perioperatively - I reordered.". Consult requests to hold heparin 0400 4/12 prior to surgery.  Goal of Therapy:  Heparin level 0.3-0.7 units/ml Monitor platelets by anticoagulation protocol: Yes   Plan:  Restart heparin infusion at 1250 units/hour, no load Obtain 6 hour heparin level and daily heparin level if indicated Monitor CBC, s/s bleeding Planned surgery for 4/12; hold heparin 01/25/21 0400   Ashey Tramontana P. Legrand Como, PharmD, [place] Please  utilize Amion for appropriate phone number to reach the unit pharmacist ([place]) 01/24/2021 8:16 AM

## 2021-01-24 NOTE — Progress Notes (Signed)
Pt refused to sign consent. Pt stated he doesn't know who Dr. Sherrie Sport is and doesn't trust the hospital to take care of him. He doesn't understand the surgery or what is going on. Pt appears fearful of the situation and stated the doctors that come to see him "do not look me in the eyes". Pt expressed worries that he will not survive the surgery. [place], MD informed.

## 2021-01-24 NOTE — TOC Initial Note (Signed)
Transition of Care [facility]) - Initial/Assessment Note    Patient Details  Name: Dustin Golden MRN: [id number] Date of Birth: 1956/02/16  Transition [facility]) CM/SW Contact:    Lennart Pall, LCSW Phone Number: 01/24/2021, 2:44 PM  Clinical Narrative:                 Met with pt and daughter, Dustin Golden, today to introduce self/ role and complete readmission risk assessment.  Pt with some complaints about his length of stay and frustrated that surgery not until tomorrow now.  Daughter very pleasant and reassuring to pt.  Pt reports that he lives alone and was managing well since his dc from [place] in March following CVA.  Daughter confirms.  Pt has just established PCP with Dr. Fredda Hammed (added to pt's info in Epic)  and denies any concerns about transportation.  Aware and agreeable for TOC SW to follow for any possible dc needs.  Expected Discharge Plan: OP Rehab Barriers to Discharge: Continued Medical Work up   Patient Goals and CMS Choice Patient states their goals for this hospitalization and ongoing recovery are:: return home      Expected Discharge Plan and Services Expected Discharge Plan: OP Rehab       Living arrangements for the past 2 months: Single Family Home                 DME Arranged: N/A DME Agency: NA                  Prior Living Arrangements/Services Living arrangements for the past 2 months: Single Family Home Lives with:: Self Patient language and need for interpreter reviewed:: Yes Do you feel safe going back to the place where you live?: Yes      Need for Family Participation in Patient Care: No (Comment) Care giver support system in place?: Yes (comment)   Criminal Activity/Legal Involvement Pertinent to Current Situation/Hospitalization: No - Comment as needed  Activities of Daily Living Home Assistive Devices/Equipment: None ADL Screening (condition at time of admission) Patient's cognitive ability adequate to safely complete daily  activities?: Yes Is the patient deaf or have difficulty hearing?: No Does the patient have difficulty seeing, even when wearing glasses/contacts?: No Does the patient have difficulty concentrating, remembering, or making decisions?: No Patient able to express need for assistance with ADLs?: Yes Does the patient have difficulty dressing or bathing?: Yes Independently performs ADLs?: No Communication: Independent Dressing (OT): Needs assistance Is this a change from baseline?: Pre-admission baseline Grooming: Needs assistance Is this a change from baseline?: Pre-admission baseline Feeding: Independent Bathing: Needs assistance Is this a change from baseline?: Pre-admission baseline Toileting: Needs assistance Is this a change from baseline?: Pre-admission baseline In/Out Bed: Needs assistance Is this a change from baseline?: Pre-admission baseline Walks in Home: Needs assistance Is this a change from baseline?: Pre-admission baseline Does the patient have difficulty walking or climbing stairs?: No Weakness of Legs: Right Weakness of Arms/Hands: Right  Permission Sought/Granted Permission sought to share information with : Family Supports Permission granted to share information with : Yes, Verbal Permission Granted  Share Information with NAME: Dustin Golden     Permission granted to share info w Relationship: daughter  Permission granted to share info w Contact Information: [phone]  Emotional Assessment Appearance:: Appears stated age Attitude/Demeanor/Rapport: Complaining,Engaged Affect (typically observed): Frustrated Orientation: : Oriented to Self,Oriented to Place,Oriented to  Time,Oriented to Situation Alcohol / Substance Use: Not Applicable Psych Involvement: No (comment)  Admission  diagnosis:  GIB (gastrointestinal bleeding) [K92.2] Acute GI bleeding [K92.2] Patient Active Problem List   Diagnosis Date Noted  . Polyp of cecum with bleeding and high-grade  dysplasia.  Probable cancer. 01/22/2021  . Acute GI bleeding 01/19/2021  . GIB (gastrointestinal bleeding) 01/17/2021  . Hyperlipidemia 01/17/2021  . History of stroke 01/17/2021  . Hyponatremia   . AKI (acute kidney injury) ([place])   . Eye discharge   . Essential hypertension   . Left middle cerebral artery stroke ([place]) 12/17/2020  . Stroke ([place]) 12/13/2020  . Polysubstance (excluding opioids) dependence ([place])    PCP:  [name], PA Pharmacy:   [facility] [phone] - [place], [place] [address] [address] Phone: [phone] Fax: [phone]     Social Determinants of Health (SDOH) Interventions    Readmission Risk Interventions Readmission Risk Prevention Plan 01/24/2021  Transportation Screening Complete  PCP or Specialist Appt within 3-5 Days Complete  HRI or [place] Complete  Social Work Consult for Center Planning/Counseling Complete  Palliative Care Screening Not Applicable  Some recent data might be hidden

## 2021-01-25 ENCOUNTER — Encounter (HOSPITAL_COMMUNITY)
Admission: EM | Disposition: A | Discharge status: Home / Self Care | Payer: Self-pay | Source: Home / Self Care | Attending: Student

## 2021-01-25 ENCOUNTER — Encounter (HOSPITAL_COMMUNITY): Payer: Self-pay | Admitting: Internal Medicine

## 2021-01-25 ENCOUNTER — Inpatient Hospital Stay: Payer: Commercial Managed Care - PPO | Admitting: Adult Health

## 2021-01-25 ENCOUNTER — Inpatient Hospital Stay (HOSPITAL_COMMUNITY): Payer: Commercial Managed Care - PPO | Admitting: Certified Registered Nurse Anesthetist

## 2021-01-25 DIAGNOSIS — I10 Essential (primary) hypertension: Secondary | ICD-10-CM | POA: Diagnosis not present

## 2021-01-25 DIAGNOSIS — C182 Malignant neoplasm of ascending colon: Secondary | ICD-10-CM | POA: Diagnosis not present

## 2021-01-25 DIAGNOSIS — K921 Melena: Secondary | ICD-10-CM | POA: Diagnosis not present

## 2021-01-25 DIAGNOSIS — K922 Gastrointestinal hemorrhage, unspecified: Secondary | ICD-10-CM | POA: Diagnosis not present

## 2021-01-25 HISTORY — PX: LAPAROSCOPIC RIGHT HEMI COLECTOMY: SHX5926

## 2021-01-25 LAB — COMPREHENSIVE METABOLIC PANEL
ALT: 16 U/L (ref 0–44)
AST: 19 U/L (ref 15–41)
Albumin: 4.2 g/dL (ref 3.5–5.0)
Alkaline Phosphatase: 107 U/L (ref 38–126)
Anion gap: 8 (ref 5–15)
BUN: 7 mg/dL — ABNORMAL LOW (ref 8–23)
CO2: 25 mmol/L (ref 22–32)
Calcium: 9.3 mg/dL (ref 8.9–10.3)
Chloride: 100 mmol/L (ref 98–111)
Creatinine, Ser: 0.98 mg/dL (ref 0.61–1.24)
GFR, Estimated: >60 mL/min (ref >60)
Glucose, Bld: 101 mg/dL — ABNORMAL HIGH (ref 70–99)
Potassium: 4.1 mmol/L (ref 3.5–5.1)
Sodium: 133 mmol/L — ABNORMAL LOW (ref 135–145)
Total Bilirubin: 0.6 mg/dL (ref 0.3–1.2)
Total Protein: 7.7 g/dL (ref 6.5–8.1)

## 2021-01-25 LAB — PREALBUMIN: Prealbumin: 22.2 mg/dL (ref 18–38)

## 2021-01-25 LAB — MAGNESIUM: Magnesium: 2 mg/dL (ref 1.7–2.4)

## 2021-01-25 LAB — HEMOGLOBIN AND HEMATOCRIT, BLOOD
HCT: 31.9 % — ABNORMAL LOW (ref 39.0–52.0)
Hemoglobin: 10.5 g/dL — ABNORMAL LOW (ref 13.0–17.0)

## 2021-01-25 SURGERY — LAPAROSCOPIC RIGHT HEMI COLECTOMY
Anesthesia: General | Site: Abdomen

## 2021-01-25 MED ORDER — FENTANYL CITRATE (PF) 250 MCG/5ML IJ SOLN
INTRAMUSCULAR | Status: AC
  Filled 2021-01-25: qty 5

## 2021-01-25 MED ORDER — DEXMEDETOMIDINE (PRECEDEX) IN NS 20 MCG/5ML (4 MCG/ML) IV SYRINGE
PREFILLED_SYRINGE | INTRAVENOUS | Status: DC | PRN
  Administered 2021-01-25 (×5): 4 ug via INTRAVENOUS

## 2021-01-25 MED ORDER — DIPHENHYDRAMINE HCL 50 MG/ML IJ SOLN
INTRAMUSCULAR | Status: DC | PRN
  Administered 2021-01-25: 12.5 mg via INTRAVENOUS

## 2021-01-25 MED ORDER — PROPOFOL 10 MG/ML IV BOLUS
INTRAVENOUS | Status: DC | PRN
  Administered 2021-01-25: 60 mg via INTRAVENOUS
  Administered 2021-01-25: 120 mg via INTRAVENOUS
  Administered 2021-01-25: 20 mg via INTRAVENOUS

## 2021-01-25 MED ORDER — MIDAZOLAM HCL 5 MG/5ML IJ SOLN
INTRAMUSCULAR | Status: DC | PRN
  Administered 2021-01-25: 2 mg via INTRAVENOUS

## 2021-01-25 MED ORDER — EPHEDRINE 5 MG/ML INJ
INTRAVENOUS | Status: AC
  Filled 2021-01-25: qty 10

## 2021-01-25 MED ORDER — ROCURONIUM BROMIDE 10 MG/ML (PF) SYRINGE
PREFILLED_SYRINGE | INTRAVENOUS | Status: AC
  Filled 2021-01-25: qty 10

## 2021-01-25 MED ORDER — SODIUM CHLORIDE 0.9 % IR SOLN
Status: DC | PRN
  Administered 2021-01-25: 1000 mL

## 2021-01-25 MED ORDER — PROPOFOL 10 MG/ML IV BOLUS
INTRAVENOUS | Status: AC
  Filled 2021-01-25: qty 20

## 2021-01-25 MED ORDER — LIDOCAINE 5 % EX PTCH
1.0000 | MEDICATED_PATCH | CUTANEOUS | Status: DC
  Administered 2021-01-25 – 2021-01-31 (×7): 1 via TRANSDERMAL
  Filled 2021-01-25 (×14): qty 1

## 2021-01-25 MED ORDER — PHENYLEPHRINE HCL (PRESSORS) 10 MG/ML IV SOLN
INTRAVENOUS | Status: AC
  Filled 2021-01-25: qty 1

## 2021-01-25 MED ORDER — GABAPENTIN 300 MG PO CAPS
ORAL_CAPSULE | ORAL | Status: AC
  Filled 2021-01-25: qty 1

## 2021-01-25 MED ORDER — FENTANYL CITRATE (PF) 100 MCG/2ML IJ SOLN
INTRAMUSCULAR | Status: DC | PRN
  Administered 2021-01-25 (×3): 50 ug via INTRAVENOUS
  Administered 2021-01-25: 100 ug via INTRAVENOUS
  Administered 2021-01-25 (×2): 50 ug via INTRAVENOUS

## 2021-01-25 MED ORDER — HYDRALAZINE HCL 20 MG/ML IJ SOLN
INTRAMUSCULAR | Status: AC
  Filled 2021-01-25: qty 1

## 2021-01-25 MED ORDER — DEXAMETHASONE SODIUM PHOSPHATE 10 MG/ML IJ SOLN
INTRAMUSCULAR | Status: DC | PRN
  Administered 2021-01-25: 5 mg via INTRAVENOUS

## 2021-01-25 MED ORDER — LIDOCAINE 2% (20 MG/ML) 5 ML SYRINGE
INTRAMUSCULAR | Status: AC
  Filled 2021-01-25: qty 5

## 2021-01-25 MED ORDER — LABETALOL HCL 5 MG/ML IV SOLN
INTRAVENOUS | Status: DC | PRN
  Administered 2021-01-25: 5 mg via INTRAVENOUS

## 2021-01-25 MED ORDER — ACETAMINOPHEN 325 MG PO TABS: 325.0000 mg | ORAL_TABLET | ORAL | Status: DC | PRN

## 2021-01-25 MED ORDER — LIDOCAINE HCL (CARDIAC) PF 100 MG/5ML IV SOSY
PREFILLED_SYRINGE | INTRAVENOUS | Status: DC | PRN
  Administered 2021-01-25: 60 mg via INTRAVENOUS

## 2021-01-25 MED ORDER — LACTATED RINGERS IV SOLN: INTRAVENOUS | Status: DC | PRN

## 2021-01-25 MED ORDER — PHENYLEPHRINE HCL-NACL 10-0.9 MG/250ML-% IV SOLN
INTRAVENOUS | Status: DC | PRN
  Administered 2021-01-25: 25 ug/min via INTRAVENOUS

## 2021-01-25 MED ORDER — OXYCODONE HCL 5 MG PO TABS: 5.0000 mg | ORAL_TABLET | Freq: Once | ORAL | Status: DC | PRN

## 2021-01-25 MED ORDER — DEXAMETHASONE SODIUM PHOSPHATE 10 MG/ML IJ SOLN
INTRAMUSCULAR | Status: AC
  Filled 2021-01-25: qty 1

## 2021-01-25 MED ORDER — OXYCODONE HCL 5 MG PO TABS
5.0000 mg | ORAL_TABLET | ORAL | Status: DC | PRN
Start: 2021-01-25 — End: 2021-01-26
  Administered 2021-01-25 (×2): 5 mg via ORAL
  Filled 2021-01-25 (×3): qty 1

## 2021-01-25 MED ORDER — MORPHINE SULFATE (PF) 4 MG/ML IV SOLN
2.0000 mg | INTRAVENOUS | Status: DC | PRN
  Administered 2021-01-25: 2 mg via INTRAVENOUS
  Administered 2021-01-26: 4 mg via INTRAVENOUS
  Administered 2021-01-27: 2 mg via INTRAVENOUS
  Administered 2021-01-27: 4 mg via INTRAVENOUS
  Administered 2021-01-27: 2 mg via INTRAVENOUS
  Administered 2021-01-28 – 2021-02-02 (×7): 4 mg via INTRAVENOUS
  Administered 2021-02-02: 2 mg via INTRAVENOUS
  Filled 2021-01-25 (×14): qty 1

## 2021-01-25 MED ORDER — DEXMEDETOMIDINE (PRECEDEX) IN NS 20 MCG/5ML (4 MCG/ML) IV SYRINGE
PREFILLED_SYRINGE | INTRAVENOUS | Status: AC
  Filled 2021-01-25: qty 5

## 2021-01-25 MED ORDER — PHENYLEPHRINE 40 MCG/ML (10ML) SYRINGE FOR IV PUSH (FOR BLOOD PRESSURE SUPPORT)
PREFILLED_SYRINGE | INTRAVENOUS | Status: AC
  Filled 2021-01-25: qty 10

## 2021-01-25 MED ORDER — ACETAMINOPHEN 500 MG PO TABS
1000.0000 mg | ORAL_TABLET | Freq: Four times a day (QID) | ORAL | Status: DC
  Administered 2021-01-25 – 2021-01-26 (×4): 1000 mg via ORAL
  Filled 2021-01-25 (×5): qty 2

## 2021-01-25 MED ORDER — BUPIVACAINE-EPINEPHRINE 0.25% -1:200000 IJ SOLN
INTRAMUSCULAR | Status: DC | PRN
Start: 2021-01-25 — End: 2021-01-25
  Administered 2021-01-25: 10 mL

## 2021-01-25 MED ORDER — MEPERIDINE HCL 50 MG/ML IJ SOLN: 6.2500 mg | INTRAMUSCULAR | Status: DC | PRN

## 2021-01-25 MED ORDER — ROCURONIUM BROMIDE 100 MG/10ML IV SOLN
INTRAVENOUS | Status: DC | PRN
  Administered 2021-01-25: 60 mg via INTRAVENOUS

## 2021-01-25 MED ORDER — OXYCODONE HCL 5 MG/5ML PO SOLN: 5.0000 mg | Freq: Once | ORAL | Status: DC | PRN

## 2021-01-25 MED ORDER — ONDANSETRON HCL 4 MG/2ML IJ SOLN: 4.0000 mg | Freq: Once | INTRAMUSCULAR | Status: DC | PRN

## 2021-01-25 MED ORDER — FENTANYL CITRATE (PF) 100 MCG/2ML IJ SOLN: 25.0000 ug | INTRAMUSCULAR | Status: DC | PRN

## 2021-01-25 MED ORDER — MIDAZOLAM HCL 2 MG/2ML IJ SOLN
INTRAMUSCULAR | Status: AC
  Filled 2021-01-25: qty 2

## 2021-01-25 MED ORDER — ACETAMINOPHEN 500 MG PO TABS
ORAL_TABLET | ORAL | Status: AC
  Administered 2021-01-25: 1000 mg via ORAL
  Filled 2021-01-25: qty 2

## 2021-01-25 MED ORDER — HYDRALAZINE HCL 20 MG/ML IJ SOLN
10.0000 mg | Freq: Once | INTRAMUSCULAR | Status: AC
  Administered 2021-01-25: 10 mg via INTRAVENOUS

## 2021-01-25 MED ORDER — FENTANYL CITRATE (PF) 100 MCG/2ML IJ SOLN
INTRAMUSCULAR | Status: AC
  Filled 2021-01-25: qty 2

## 2021-01-25 MED ORDER — ACETAMINOPHEN 160 MG/5ML PO SOLN: 325.0000 mg | ORAL | Status: DC | PRN

## 2021-01-25 MED ORDER — ONDANSETRON HCL 4 MG/2ML IJ SOLN
INTRAMUSCULAR | Status: AC
  Filled 2021-01-25: qty 2

## 2021-01-25 MED ORDER — PHENYLEPHRINE HCL (PRESSORS) 10 MG/ML IV SOLN
INTRAVENOUS | Status: DC | PRN
  Administered 2021-01-25: 80 ug via INTRAVENOUS

## 2021-01-25 MED ORDER — BUPIVACAINE-EPINEPHRINE (PF) 0.25% -1:200000 IJ SOLN
INTRAMUSCULAR | Status: AC
  Filled 2021-01-25: qty 30

## 2021-01-25 MED ORDER — METHOCARBAMOL 500 MG PO TABS: 500.0000 mg | ORAL_TABLET | Freq: Four times a day (QID) | ORAL | Status: DC | PRN

## 2021-01-25 SURGICAL SUPPLY — 40 items
ADH SKN CLS APL DERMABOND .7 (GAUZE/BANDAGES/DRESSINGS)
BLADE EXTENDED COATED 6.5IN (ELECTRODE) ×2 IMPLANT
CABLE HIGH FREQUENCY MONO STRZ (ELECTRODE) ×2 IMPLANT
COVER WAND RF STERILE (DRAPES) IMPLANT
DECANTER SPIKE VIAL GLASS SM (MISCELLANEOUS) IMPLANT
DERMABOND ADVANCED (GAUZE/BANDAGES/DRESSINGS)
DERMABOND ADVANCED .7 DNX12 (GAUZE/BANDAGES/DRESSINGS) ×1 IMPLANT
DRSG OPSITE POSTOP 4X10 (GAUZE/BANDAGES/DRESSINGS) ×1 IMPLANT
DRSG TEGADERM 2-3/8X2-3/4 SM (GAUZE/BANDAGES/DRESSINGS) ×1 IMPLANT
ELECT REM PT RETURN 15FT ADLT (MISCELLANEOUS) ×2 IMPLANT
GAUZE SPONGE 2X2 8PLY STRL LF (GAUZE/BANDAGES/DRESSINGS) ×1 IMPLANT
GLOVE SURG ENC MOIS LTX SZ7.5 (GLOVE) ×2 IMPLANT
GOWN STRL REUS W/TWL LRG LVL3 (GOWN DISPOSABLE) ×2 IMPLANT
GOWN STRL REUS W/TWL XL LVL3 (GOWN DISPOSABLE) ×6 IMPLANT
IRRIG SUCT STRYKERFLOW 2 WTIP (MISCELLANEOUS) ×2
IRRIGATION SUCT STRKRFLW 2 WTP (MISCELLANEOUS) IMPLANT
KIT BASIN OR (CUSTOM PROCEDURE TRAY) ×1 IMPLANT
KIT TURNOVER KIT A (KITS) ×2 IMPLANT
MARKER SKIN DUAL TIP RULER LAB (MISCELLANEOUS) IMPLANT
NDL INSUFFLATION 14GA 120MM (NEEDLE) ×1 IMPLANT
NEEDLE INSUFFLATION 14GA 120MM (NEEDLE) ×2 IMPLANT
PACK COLON (CUSTOM PROCEDURE TRAY) ×2 IMPLANT
RELOAD PROXIMATE 75MM BLUE (ENDOMECHANICALS) ×6 IMPLANT
RELOAD STAPLE 75 3.8 BLU REG (ENDOMECHANICALS) IMPLANT
SCISSORS LAP 5X35 DISP (ENDOMECHANICALS) ×2 IMPLANT
SHEARS HARMONIC ACE PLUS 36CM (ENDOMECHANICALS) IMPLANT
SLEEVE XCEL OPT CAN 5 100 (ENDOMECHANICALS) ×3 IMPLANT
SOL ANTI FOG 6CC (MISCELLANEOUS) ×1 IMPLANT
SOLUTION ANTI FOG 6CC (MISCELLANEOUS) ×1
SPONGE GAUZE 2X2 8PLY STRL LF (GAUZE/BANDAGES/DRESSINGS) ×1 IMPLANT
SPONGE GAUZE 2X2 STER 10/PKG (GAUZE/BANDAGES/DRESSINGS) ×1
SPONGE LAP 18X18 RF (DISPOSABLE) ×1 IMPLANT
STAPLER PROXIMATE 75MM BLUE (STAPLE) ×1 IMPLANT
SUT VIC AB 4-0 PS2 27 (SUTURE) IMPLANT
TOWEL OR 17X26 10 PK STRL BLUE (TOWEL DISPOSABLE) ×2 IMPLANT
TOWEL OR NON WOVEN STRL DISP B (DISPOSABLE) ×2 IMPLANT
TRAY FOLEY MTR SLVR 16FR STAT (SET/KITS/TRAYS/PACK) ×2 IMPLANT
TROCAR BLADELESS OPT 5 100 (ENDOMECHANICALS) ×2 IMPLANT
TROCAR XCEL BLUNT TIP 100MML (ENDOMECHANICALS) IMPLANT
TROCAR XCEL NON-BLD 11X100MML (ENDOMECHANICALS) IMPLANT

## 2021-01-25 NOTE — Op Note (Signed)
01/25/2021  12:43 PM  PATIENT:  Dustin Golden  65 y.o. male  PRE-OPERATIVE DIAGNOSIS: Cecal mass  POST-OPERATIVE DIAGNOSIS: Cecal mass  PROCEDURE:  Procedure(s): Diagnostic laparoscopy, laparoscopic lysis adhesions x45 minutes Exploratory laparotomy, right hemicolectomy  SURGEON:  Surgeon(s) and Role:    Ralene Ok, MD - Primary  PHYSICIAN ASSISTANT: Melina Modena, PA-C  ANESTHESIA:   local and general  EBL:  30 mL   BLOOD ADMINISTERED:none  DRAINS: none   LOCAL MEDICATIONS USED:  BUPIVICAINE   SPECIMEN:  Source of Specimen: Right colon  DISPOSITION OF SPECIMEN:  PATHOLOGY  COUNTS:  YES  TOURNIQUET:  * No tourniquets in log *  DICTATION: .Dragon Dictation Indication procedure: Patient is a 65 year old male, came in secondary to lower GI bleed.  Patient was found to have a right cecal mass.  Patient subsequently underwent work-up.  Patient was then taken to the operating for urgent colectomy.  Findings: Patient did have some very dense adhesions to his previous right lower quadrant incision site.  Laparoscopic lysis of adhesions was undertaken.  Secondary to the density of the adhesions I proceeded with a laparotomy. The right colon was excised in its entirety.  The mass was easily palpated at the the cecum.  There was definitely some small bowel small bowel adhesions into the pelvis.  These were lysed.  The right ileocolic anastomosis appeared to be viable.  The mesenteric window was not reapproximated.  Details of procedure: After the patient was consented he was taken back to the OR placed in the supine position with bilateral SCDs in place.  Foley catheter was placed.  Patient was then prepped and draped in a sterile fashion.  A timeout was called and all facts verified.  At this time a Veress needle technique was used to insufflate the abdomen to 15 mmHg in the left subcostal margin.  Subsequent to this a 5 mmtrocar and camera placed intra-abdominal.  There is  no injury to any intra-abdominal organs.  A second third 5 mm trocar was then placed in the left lower quadrant and suprapubic area.  At this time the patient position.  There is significant mount of omental and small bowel adhesions to the anterior abdominal wall.  These were taken down sharply.  In the area of the right lower quadrant previous incision site there was some significant dense adhesions to the anterior abdominal wall.  These were taken down slowly.  Secondary to the density of the adhesions I proceeded with a laparotomy.  Laparoscopic LOA was approximately 45 minutes.  At this time a upper midline incision was made.  Cautery was used to maintain hemostasis and dissection was taken down to the linea alba.  This was incised.  At this time the fascia was extended to the length of the skin incision.  A Bookwalter retractor was then placed to help with retraction.  At this time I was able to lyse adhesions from the right colon to the anterior abdominal wall.  These were some thin adhesions.  I was able to incise white line of Toldt.  This allowed me to medialize the colon.  The duodenum was visualized and protected.  I proceeded my dissection inferiorly.  There was some small bowel small bowel adhesions in the pelvis.  These were lysed sharply.  This allowed me to visualize in lay out the distal ileum.  The cecal mass was easily palpable.  At this time I proceeded to transect the small bowel/distal ileum after mesenteric window was  made.  This was done with a 75 GIA stapler.  An area just distal to the right colic artery was chosen for division of the colon.  A 75 GIA stapler was used to transect the colon.  At this time the corner of the staple line was excised.  The anastomosis was created using a 75 GIA stapler and the anastomosis was closed with another firing of the GIA stapler, blue load.  This time an apex stitch was placed using a 3-0 silk.  The mesenteric window was left without  closure.  The abdomen was irrigated with sterile saline.  The colorectal protocol was then followed.  At this time the omentum was brought over the midline and the anastomosis.  The fascia was then reapproximated using #1 PDS in a standard running fashion x2.  Skin staples were used to close all port sites and the midline wound.  Patient taught procedure well was taken to the recovery in stable condition.   PLAN OF CARE: Admit to inpatient   PATIENT DISPOSITION:  PACU - hemodynamically stable.   Delay start of Pharmacological VTE agent (>24hrs) due to surgical blood loss or risk of bleeding: yes

## 2021-01-25 NOTE — Transfer of Care (Signed)
Immediate Anesthesia Transfer of Care Note  Patient: Dustin Golden  Procedure(s) Performed: LAPAROSCOPIC RIGHT COLECTOMY (N/A Abdomen)  Patient Location: PACU  Anesthesia Type:General  Level of Consciousness: awake and drowsy  Airway & Oxygen Therapy: Patient Spontanous Breathing and Patient connected to face mask  Post-op Assessment: Report given to RN and Post -op Vital signs reviewed and stable  Post vital signs: Reviewed and stable  Last Vitals:  Vitals Value Taken Time  BP    Temp    Pulse 92 01/25/21 1259  Resp 14 01/25/21 1259  SpO2 100 % 01/25/21 1259  Vitals shown include unvalidated device data.  Last Pain:  Vitals:   01/25/21 0940  TempSrc:   PainSc: 0-No pain      Patients Stated Pain Goal: 2 (69/45/03 8882)  Complications: No complications documented.

## 2021-01-25 NOTE — Progress Notes (Signed)
5 Days Post-Op   Subjective/Chief Complaint: Doing well no c/o   Objective: Vital signs in last 24 hours: Temp:  [97.7 F (36.5 C)-98.1 F (36.7 C)] 97.7 F (36.5 C) (04/12 0434) Pulse Rate:  [64-75] 65 (04/12 0434) Resp:  [16-18] 16 (04/12 0434) BP: (107-122)/(72-77) 118/77 (04/12 0434) SpO2:  [99 %-100 %] 100 % (04/12 0434) Last BM Date: 01/24/21  Intake/Output from previous day: 04/11 0701 - 04/12 0700 In: 720 [P.O.:720] Out: 1475 [Urine:1475] Intake/Output this shift: No intake/output data recorded.  PE:  Constitutional: No acute distress, conversant, appears states age. Eyes: Anicteric sclerae, moist conjunctiva, no lid lag Lungs: Clear to auscultation bilaterally, normal respiratory effort CV: regular rate and rhythm, no murmurs, no peripheral edema, pedal pulses 2+ GI: Soft, no masses or hepatosplenomegaly, non-tender to palpation Skin: No rashes, palpation reveals normal turgor Psychiatric: appropriate judgment and insight, oriented to person, place, and time   Lab Results:  Recent Labs    01/23/21 0448 01/24/21 0430 01/25/21 0351  WBC 5.5  --   --   HGB 10.1* 10.5* 10.5*  HCT 31.1* 31.6* 31.9*  PLT 252  --   --    BMET Recent Labs    01/25/21 0351  NA 133*  K 4.1  CL 100  CO2 25  GLUCOSE 101*  BUN 7*  CREATININE 0.98  CALCIUM 9.3   PT/INR No results for input(s): LABPROT, INR in the last 72 hours. ABG No results for input(s): PHART, HCO3 in the last 72 hours.  Invalid input(s): PCO2, PO2  Studies/Results: No results found.  Anti-infectives: Anti-infectives (From admission, onward)   Start     Dose/Rate Route Frequency Ordered Stop   01/25/21 0600  cefoTEtan (CEFOTAN) 2 g in sodium chloride 0.9 % 100 mL IVPB        2 g 200 mL/hr over 30 Minutes Intravenous On call to O.R. 01/24/21 1405 01/26/21 0559   01/24/21 0600  cefoTEtan (CEFOTAN) 2 g in sodium chloride 0.9 % 100 mL IVPB  Status:  Discontinued        2 g 200 mL/hr over 30  Minutes Intravenous On call to O.R. 01/22/21 0821 01/24/21 1406   01/23/21 1400  neomycin (MYCIFRADIN) tablet 1,000 mg       "And" Linked Group Details   1,000 mg Oral 3 times per day 01/22/21 0821 01/23/21 2233   01/23/21 1400  metroNIDAZOLE (FLAGYL) tablet 1,000 mg       "And" Linked Group Details   1,000 mg Oral 3 times per day 01/22/21 8841 01/23/21 2233      Assessment/Plan: 63M cecal mass LGIB, anemia CVA  -To OR for lap assisted right colon -I d/w him the risks and benefits of the procedure to include but not limited to: infection, bleeding, damage to surrounding structures, stroke, possible need for ostomy, and possible need for further surgery.  Patient voiced understanding and wishes to proceed.   LOS: 6 days    Ralene Ok 01/25/2021

## 2021-01-25 NOTE — Anesthesia Postprocedure Evaluation (Signed)
Anesthesia Post Note  Patient: Kathe Mariner  Procedure(s) Performed: LAPAROSCOPIC RIGHT COLECTOMY (N/A Abdomen)     Patient location during evaluation: PACU Anesthesia Type: General Level of consciousness: awake and alert Pain management: pain level controlled Vital Signs Assessment: post-procedure vital signs reviewed and stable Respiratory status: spontaneous breathing, nonlabored ventilation, respiratory function stable and patient connected to nasal cannula oxygen Cardiovascular status: blood pressure returned to baseline and stable Postop Assessment: no apparent nausea or vomiting Anesthetic complications: no   No complications documented.  Last Vitals:  Vitals:   01/25/21 1407 01/25/21 1501  BP: 126/75 110/71  Pulse: 79 77  Resp: 16 18  Temp:  36.7 C  SpO2: 97% 100%    Last Pain:  Vitals:   01/25/21 1501  TempSrc: Oral  PainSc:                  Dreamer Carillo

## 2021-01-25 NOTE — Anesthesia Preprocedure Evaluation (Addendum)
Anesthesia Evaluation  Patient identified by MRN, date of birth, ID band Patient awake    Reviewed: Allergy & Precautions, H&P , NPO status , Patient's Chart, lab work & pertinent test results  Airway Mallampati: II  TM Distance: >3 FB Neck ROM: Full    Dental no notable dental hx. (+) Teeth Intact, Dental Advisory Given, Missing, Poor Dentition   Pulmonary former smoker,    Pulmonary exam normal breath sounds clear to auscultation       Cardiovascular hypertension, Pt. on medications Normal cardiovascular exam Rhythm:Regular Rate:Normal  12/13/20 Echo  1. Left ventricular ejection fraction, by estimation, is 35 to 40%. The  left ventricle has moderately decreased function. The left ventricle  demonstrates global hypokinesis. There is moderate concentric left  ventricular hypertrophy. Left ventricular  diastolic parameters are consistent with Grade I diastolic dysfunction  (impaired relaxation).  2. Right ventricular systolic function is mildly reduced. The right  ventricular size is mildly enlarged. There is normal pulmonary artery  systolic pressure. The estimated right ventricular systolic pressure is  16.1 mmHg.    Neuro/Psych R sided weakness CVA, Residual Symptoms    GI/Hepatic (+)     substance abuse  ,   Endo/Other    Renal/GU Renal disease     Musculoskeletal  (+) narcotic dependent  Abdominal   Peds  Hematology  (+) Blood dyscrasia, anemia , Lab Results      Component                Value               Date                      WBC                      5.6                 01/19/2021                HGB                      10.4 (L)            01/19/2021                HCT                      31.6 (L)            01/19/2021                MCV                      80.0                01/19/2021                PLT                      211                 01/19/2021              Anesthesia Other  Findings   Reproductive/Obstetrics                           Anesthesia Physical  Anesthesia Plan  ASA: III  Anesthesia Plan: General   Post-op Pain Management:    Induction: Intravenous  PONV Risk Score and Plan: 2 and Ondansetron and Dexamethasone  Airway Management Planned: Oral ETT  Additional Equipment: None  Intra-op Plan:   Post-operative Plan: Extubation in OR  Informed Consent: I have reviewed the patients History and Physical, chart, labs and discussed the procedure including the risks, benefits and alternatives for the proposed anesthesia with the patient or authorized representative who has indicated his/her understanding and acceptance.     Dental advisory given  Plan Discussed with: CRNA and Anesthesiologist  Anesthesia Plan Comments: (Melena anemia for colonoscopy)       Anesthesia Quick Evaluation

## 2021-01-25 NOTE — Anesthesia Procedure Notes (Signed)
Procedure Name: Intubation Date/Time: 01/25/2021 10:49 AM Performed by: Elaina Pattee, CRNA Pre-anesthesia Checklist: Patient identified, Emergency Drugs available, Suction available, Patient being monitored and Timeout performed Patient Re-evaluated:Patient Re-evaluated prior to induction Oxygen Delivery Method: Circle system utilized Preoxygenation: Pre-oxygenation with 100% oxygen Induction Type: IV induction and Cricoid Pressure applied Ventilation: Mask ventilation without difficulty Laryngoscope Size: Mac and 4 Grade View: Grade II Tube type: Oral Tube size: 7.5 mm Number of attempts: 1 Airway Equipment and Method: Stylet Placement Confirmation: ETT inserted through vocal cords under direct vision,  positive ETCO2 and breath sounds checked- equal and bilateral Secured at: 25 cm Tube secured with: Tape Dental Injury: Teeth and Oropharynx as per pre-operative assessment

## 2021-01-25 NOTE — Progress Notes (Signed)
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  EGD on 4/6 with mild erythematous mucosa in duodenum.  Colonoscopy on 4/7 with ulcerated cecal mass.  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  Pathology with high-grade dysplasia.  Brilinta on hold.  Plan for surgery today.  Subjective: Seen and examined earlier this morning.  Reasonably anxious or worried about surgery.  Also frustration about prolonged wait for surgery.  Otherwise, no complaints.  He denies pain, shortness of breath, GI or UTI symptoms.  Objective: Vitals:   01/24/21 1402 01/24/21 2129 01/25/21 0434 01/25/21 0923  BP: 122/72 107/72 118/77 120/88  Pulse: 75 64 65 (!) 101  Resp: 18 16 16 18   Temp: 97.8 F (36.6 C) 98.1 F (36.7 C) 97.7 F (36.5 C) 98.8 F (37.1 C)  TempSrc: Oral Oral Oral Oral  SpO2: 100% 99% 100% 100%  Weight:      Height:        Intake/Output Summary (Last 24 hours) at 01/25/2021 1147 Last data filed at 01/25/2021 1110 Gross per 24 hour  Intake 820 ml  Output 1075 ml  Net -255 ml   Filed Weights   01/17/21 1352 01/20/21 1135  Weight: 87.2 kg 87.2 kg    Examination:  GENERAL: No apparent distress.  Nontoxic. HEENT: MMM.  Vision and hearing grossly intact.  NECK: Supple.  No apparent JVD.  RESP: On RA.  No IWOB.  Fair aeration bilaterally. CVS:  RRR. Heart sounds normal.  ABD/GI/GU: BS+. Abd soft, NTND.  MSK/EXT:  Moves extremities. No apparent deformity. No edema.  SKIN: no apparent skin lesion or wound NEURO: Awake, alert  and oriented appropriately.  Some dysarthria.  RUE strength 4/5 PSYCH: Calm. Normal affect.   Procedures:  4/6-EGD with erythematous mucosa and duodenum 4/7-colonoscopy with fungating mass in cecum.  Pathology with high-grade dysplasia.  Microbiology summarized: COVID-19 and influenza PCR nonreactive.  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 -General surgery following-plan for surgery today -Continue holding Brilinta.  Okay to continue aspirin per surgery.  Discontinued IV heparin. -Patient seems to be optimized for surgery from cardiopulmonary standpoint.  Recent TTE reassuring.  Hematochezia: Likely due to the above.  Relatively stable Recent Labs    12/14/20 0412 12/15/20 0235 12/20/20 0439 01/17/21 1444 01/18/21 0506 01/19/21 0415 01/22/21 0745 01/23/21 0448 01/24/21 0430 01/25/21 0351  HGB 12.6* 12.2* 12.5* 11.2* 10.8* 10.4* 10.8* 10.1* 10.5* 10.5*  -Monitor H&H  Recent left ACA and MCA/M2 stroke s/p left ICA mechanical thrombectomy followed by rescue stenting with mid residual upper and lower extremity weakness and dysarthria: Stable. -Holding Brilinta for upcoming surgery.  We will resume as soon as possible after surgery. -Okay to continue aspirin per surgery. -Discontinued IV heparin -Patient ambulating independently.  Essential hypertension: Normotensive  -continue amlodipine and lisinopril.  Hyperlipidemia: Stable -Continue statins.  Concerns about medications: started on Seroquel and as needed Haldol and Klonopin.  The indication was not clear nor documented.  Patient was upset.  Seroquel, Haldol and  Klonopin discontinued.  Has been stable off those medications.  Body mass index is 24.03 kg/m.         DVT prophylaxis:  SCD's Start: 01/22/21 0818 SCDs Start: 01/17/21 2046 Patient is also on IV heparin. Code Status: Full code Family Communication: Patient and/or RN.  Available if any question.  Level of care: Med-Surg Status is: Inpatient  Remains inpatient appropriate because:Ongoing diagnostic testing needed not appropriate for outpatient work up, IV treatments appropriate due to intensity of illness or inability to take PO and Inpatient level of care appropriate due to severity of illness   Dispo: The patient is from: Home              Anticipated d/c is to: Home              Patient currently is not medically stable to d/c.   Difficult to place patient No       Consultants:  Gastroenterology-signed off Neurology over the phone General surgery   Sch Meds:  Scheduled Meds: . [MAR Hold] alvimopan  12 mg Oral On Call to OR  . [MAR Hold] amLODipine  10 mg Oral Daily  . [MAR Hold] aspirin EC  81 mg Oral Daily  . [MAR Hold] atorvastatin  20 mg Oral Daily  . [MAR Hold] bupivacaine liposome  20 mL Infiltration Once  . [MAR Hold] feeding supplement  1 Container Oral TID WC  . [MAR Hold] folic acid  1 mg Oral Daily  . gabapentin      . [MAR Hold] lisinopril  2.5 mg Oral Daily  . [MAR Hold] pantoprazole  40 mg Oral BID  . [MAR Hold] polyethylene glycol  17 g Oral BID   Continuous Infusions:  PRN Meds:.[MAR Hold] acetaminophen **OR** [MAR Hold] acetaminophen, [MAR Hold] ondansetron **OR** [MAR Hold] ondansetron (ZOFRAN) IV  Antimicrobials: Anti-infectives (From admission, onward)   Start     Dose/Rate Route Frequency Ordered Stop   01/25/21 0600  [MAR Hold]  cefoTEtan (CEFOTAN) 2 g in sodium chloride 0.9 % 100 mL IVPB        (MAR Hold since Tue 01/25/2021 at 0914.Hold Reason: Transfer to a Procedural area.)   2 g 200 mL/hr over 30 Minutes Intravenous On call to O.R. 01/24/21 1405 01/25/21 1110   01/24/21 0600  cefoTEtan (CEFOTAN) 2 g in sodium chloride 0.9 % 100 mL IVPB  Status:  Discontinued        2 g 200 mL/hr over 30 Minutes Intravenous On call to O.R. 01/22/21 0821 01/24/21 1406   01/23/21 1400  neomycin (MYCIFRADIN) tablet 1,000 mg        "And" Linked Group Details   1,000 mg Oral 3 times per day 01/22/21 0821 01/23/21 2233   01/23/21 1400  metroNIDAZOLE (FLAGYL) tablet 1,000 mg       "And" Linked Group Details   1,000 mg Oral 3 times per day 01/22/21 0821 01/23/21 2233       I have personally reviewed the following labs and images: CBC: Recent Labs  Lab 01/19/21 0415 01/22/21 0745 01/23/21 0448 01/24/21 0430 01/25/21 0351  WBC 5.6 5.8 5.5  --   --   HGB 10.4* 10.8* 10.1* 10.5* 10.5*  HCT 31.6* 32.6* 31.1* 31.6* 31.9*  MCV 80.0 80.3 80.2  --   --   PLT 211 257 252  --   --    BMP &GFR Recent Labs  Lab 01/25/21 0351  NA 133*  K 4.1  CL 100  CO2  25  GLUCOSE 101*  BUN 7*  CREATININE 0.98  CALCIUM 9.3  MG 2.0   Estimated Creatinine Clearance: 91 mL/min (by C-G formula based on SCr of 0.98 mg/dL). Liver & Pancreas: Recent Labs  Lab 01/25/21 0351  AST 19  ALT 16  ALKPHOS 107  BILITOT 0.6  PROT 7.7  ALBUMIN 4.2   No results for input(s): LIPASE, AMYLASE in the last 168 hours. No results for input(s): AMMONIA in the last 168 hours. Diabetic: No results for input(s): HGBA1C in the last 72 hours. No results for input(s): GLUCAP in the last 168 hours. Cardiac Enzymes: No results for input(s): CKTOTAL, CKMB, CKMBINDEX, TROPONINI in the last 168 hours. No results for input(s): PROBNP in the last 8760 hours. Coagulation Profile: No results for input(s): INR, PROTIME in the last 168 hours. Thyroid Function Tests: No results for input(s): TSH, T4TOTAL, FREET4, T3FREE, THYROIDAB in the last 72 hours. Lipid Profile: No results for input(s): CHOL, HDL, LDLCALC, TRIG, CHOLHDL, LDLDIRECT in the last 72 hours. Anemia Panel: No results for input(s): VITAMINB12, FOLATE, FERRITIN, TIBC, IRON, RETICCTPCT in the last 72 hours. Urine analysis:    Component Value Date/Time   COLORURINE STRAW (A) 12/13/2020 1245   APPEARANCEUR CLEAR 12/13/2020 1245   LABSPEC 1.019 12/13/2020 1245   PHURINE 7.0 12/13/2020  1245   GLUCOSEU NEGATIVE 12/13/2020 1245   HGBUR NEGATIVE 12/13/2020 1245   [place] 12/13/2020 1245   [place] 12/13/2020 1245   PROTEINUR NEGATIVE 12/13/2020 1245   NITRITE NEGATIVE 12/13/2020 1245   LEUKOCYTESUR NEGATIVE 12/13/2020 1245   Sepsis Labs: Invalid input(s): PROCALCITONIN, [place]  Microbiology: Recent Results (from the past 240 hour(s))  Resp Panel by RT-PCR (Flu A&B, Covid) Nasopharyngeal Swab     Status: None   Collection Time: 01/17/21  3:47 PM   Specimen: Nasopharyngeal Swab; Nasopharyngeal(NP) swabs in vial transport medium  Result Value Ref Range Status   SARS Coronavirus 2 by RT PCR NEGATIVE NEGATIVE Final    Comment: (NOTE) SARS-CoV-2 target nucleic acids are NOT DETECTED.  The SARS-CoV-2 RNA is generally detectable in upper respiratory specimens during the acute phase of infection. The lowest concentration of SARS-CoV-2 viral copies this assay can detect is 138 copies/mL. A negative result does not preclude SARS-Cov-2 infection and should not be used as the sole basis for treatment or other patient management decisions. A negative result may occur with  improper specimen collection/handling, submission of specimen other than nasopharyngeal swab, presence of viral mutation(s) within the areas targeted by this assay, and inadequate number of viral copies(<138 copies/mL). A negative result must be combined with clinical observations, patient history, and epidemiological information. The expected result is Negative.  Fact Sheet for Patients:  EntrepreneurPulse.com.au  Fact Sheet for Healthcare Providers:  IncredibleEmployment.be  This test is no t yet approved or cleared by the [place] FDA and  has been authorized for detection and/or diagnosis of SARS-CoV-2 by FDA under an Emergency Use Authorization (EUA). This EUA will remain  in effect (meaning this test can be used) for the duration  of the COVID-19 declaration under Section 564(b)(1) of the Act, 21 U.S.C.section 360bbb-3(b)(1), unless the authorization is terminated  or revoked sooner.       Influenza A by PCR NEGATIVE NEGATIVE Final   Influenza B by PCR NEGATIVE NEGATIVE Final    Comment: (NOTE) The Xpert Xpress SARS-CoV-2/FLU/RSV plus assay is intended as an aid in the diagnosis of influenza from Nasopharyngeal swab specimens and should not be used as a  sole basis for treatment. Nasal washings and aspirates are unacceptable for Xpert Xpress SARS-CoV-2/FLU/RSV testing.  Fact Sheet for Patients: EntrepreneurPulse.com.au  Fact Sheet for Healthcare Providers: IncredibleEmployment.be  This test is not yet approved or cleared by the [place] FDA and has been authorized for detection and/or diagnosis of SARS-CoV-2 by FDA under an Emergency Use Authorization (EUA). This EUA will remain in effect (meaning this test can be used) for the duration of the COVID-19 declaration under Section 564(b)(1) of the Act, 21 U.S.C. section 360bbb-3(b)(1), unless the authorization is terminated or revoked.  Performed at [facility], [address], [place], [place] [address]   Surgical pcr screen     Status: None   Collection Time: 01/24/21  2:56 AM   Specimen: Nasal Mucosa; Nasal Swab  Result Value Ref Range Status   MRSA, PCR NEGATIVE NEGATIVE Final   Staphylococcus aureus NEGATIVE NEGATIVE Final    Comment: (NOTE) The Xpert SA Assay (FDA approved for NASAL specimens in patients 44 years of age and older), is one component of a comprehensive surveillance program. It is not intended to diagnose infection nor to guide or monitor treatment. Performed at [facility], [facility] [address], [place],  [address]     Radiology Studies: No results found.    Bobie Caris T. [place]  If 7PM-7AM, please contact  night-coverage [email or web address] 01/25/2021, 11:47 AM

## 2021-01-26 ENCOUNTER — Encounter (HOSPITAL_COMMUNITY): Payer: Self-pay | Admitting: General Surgery

## 2021-01-26 DIAGNOSIS — K922 Gastrointestinal hemorrhage, unspecified: Secondary | ICD-10-CM | POA: Diagnosis not present

## 2021-01-26 DIAGNOSIS — I10 Essential (primary) hypertension: Secondary | ICD-10-CM | POA: Diagnosis not present

## 2021-01-26 DIAGNOSIS — C182 Malignant neoplasm of ascending colon: Secondary | ICD-10-CM | POA: Diagnosis not present

## 2021-01-26 DIAGNOSIS — K921 Melena: Secondary | ICD-10-CM | POA: Diagnosis not present

## 2021-01-26 LAB — RENAL FUNCTION PANEL
Albumin: 3.8 g/dL (ref 3.5–5.0)
Anion gap: 11 (ref 5–15)
BUN: 13 mg/dL (ref 8–23)
CO2: 23 mmol/L (ref 22–32)
Calcium: 9 mg/dL (ref 8.9–10.3)
Chloride: 100 mmol/L (ref 98–111)
Creatinine, Ser: 1.21 mg/dL (ref 0.61–1.24)
GFR, Estimated: >60 mL/min (ref >60)
Glucose, Bld: 164 mg/dL — ABNORMAL HIGH (ref 70–99)
Phosphorus: 4.7 mg/dL — ABNORMAL HIGH (ref 2.5–4.6)
Potassium: 4.2 mmol/L (ref 3.5–5.1)
Sodium: 134 mmol/L — ABNORMAL LOW (ref 135–145)

## 2021-01-26 LAB — CBC
HCT: 31.9 % — ABNORMAL LOW (ref 39.0–52.0)
Hemoglobin: 10.7 g/dL — ABNORMAL LOW (ref 13.0–17.0)
MCH: 26.2 pg (ref 26.0–34.0)
MCHC: 33.5 g/dL (ref 30.0–36.0)
MCV: 78.2 fL — ABNORMAL LOW (ref 80.0–100.0)
Platelets: 306 10*3/uL (ref 150–400)
RBC: 4.08 MIL/uL — ABNORMAL LOW (ref 4.22–5.81)
RDW: 13.5 % (ref 11.5–15.5)
WBC: 9.9 10*3/uL (ref 4.0–10.5)
nRBC: 0 % (ref 0.0–0.2)

## 2021-01-26 LAB — MAGNESIUM: Magnesium: 1.9 mg/dL (ref 1.7–2.4)

## 2021-01-26 MED ORDER — METHOCARBAMOL 500 MG PO TABS
500.0000 mg | ORAL_TABLET | Freq: Three times a day (TID) | ORAL | Status: DC
  Administered 2021-01-26 (×2): 500 mg via ORAL
  Filled 2021-01-26 (×3): qty 1

## 2021-01-26 MED ORDER — OXYCODONE HCL 5 MG PO TABS
5.0000 mg | ORAL_TABLET | ORAL | Status: DC | PRN
  Administered 2021-01-26: 5 mg via ORAL

## 2021-01-26 NOTE — Progress Notes (Signed)
PROGRESS NOTE  Dustin Golden DXA:[id number] DOB: Apr 02, 1956   PCP: [name], PA  Patient is from: Home  DOA: 01/17/2021 LOS: 7  Chief complaints: Rectal bleed  Brief Narrative / Interim history: [name] with PMH of recent left ACA and MCA CVA s/p left ICA thrombectomy followed by vascular stenting with mild residual upper and lower extremity weakness and dysarthria on DAPT with aspirin and Brilinta presenting with BRBPR.  EGD on 4/6 with mild erythematous mucosa in duodenum.  Colonoscopy on 4/7 with ulcerated cecal mass.  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  Pathology with high-grade dysplasia.  Brilinta on hold.  Patient underwent laparotomy with right hemicolectomy on 01/25/2021.  Subjective: Seen and examined earlier this morning.  No major events overnight of this morning.  Reports pain over surgical site.  He rates his pain a 2/10.  Denies nausea or vomiting.  Has not had bowel movements or flatus yet.  Denies chest pain or dyspnea.  Objective: Vitals:   01/26/21 0204 01/26/21 0543 01/26/21 0947 01/26/21 1353  BP: 126/74 129/81 133/82 (!) 145/97  Pulse: 98 95 (!) 103 100  Resp: 17 17 18 18   Temp: 99.2 F (37.3 C) 98.9 F (37.2 C) 98.8 F (37.1 C) 97.7 F (36.5 C)  TempSrc: Oral Oral Oral Oral  SpO2: 97% 100% 96% 95%  Weight:      Height:        Intake/Output Summary (Last 24 hours) at 01/26/2021 1639 Last data filed at 01/26/2021 1400 Gross per 24 hour  Intake 360 ml  Output 2425 ml  Net -2065 ml   Filed Weights   01/17/21 1352 01/20/21 1135  Weight: 87.2 kg 87.2 kg    Examination:  GENERAL: No apparent distress.  Nontoxic. HEENT: MMM.  Vision and hearing grossly intact.  NECK: Supple.  No apparent JVD.  RESP: 100% on 2 L.  No IWOB.  Fair aeration bilaterally. CVS:  RRR. Heart sounds normal.  ABD/GI/GU: No BS.  Soft.  Tender.  Small dried blood on honeycomb  dressing. MSK/EXT:  Moves extremities. No apparent deformity. No edema.  SKIN: no apparent skin lesion or wound NEURO: Awake but not alert.  Oriented appropriately.  No apparent focal neuro deficit. PSYCH: Calm. Normal affect.  Procedures:  4/6-EGD with erythematous mucosa and duodenum 4/7-colonoscopy with fungating mass in cecum.  Pathology with high-grade dysplasia.  Microbiology summarized: COVID-19 and influenza PCR nonreactive.  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  Pathology with high-grade dysplasia. -General surgery recommendations  -cont CLD today due to nausea  -needs to mobilize.  PT/OT/SLP consults placed  -can likely restart Brilinta tomorrow as long as hgb remains stable.  -IS/pulm toilet  -Follow surgical pathology  Hematochezia: Likely due to the above.  Relatively stable Recent Labs    12/15/20 0235 12/20/20 0439 01/17/21 1444 01/18/21 0506 01/19/21 0415 01/22/21 0745 01/23/21 0448 01/24/21 0430 01/25/21 0351 01/26/21 0435  HGB 12.2* 12.5* 11.2* 10.8* 10.4* 10.8* 10.1* 10.5* 10.5* 10.7*  -Monitor H&H  Recent left ACA and MCA/M2 stroke s/p left ICA mechanical thrombectomy followed by rescue stenting with mid residual upper and lower extremity weakness and dysarthria: Stable. -Continue aspirin.  Will resume Brilinta on 4/14 per surgery  Essential hypertension: Normotensive  -continue amlodipine and lisinopril.  Hyperlipidemia: Stable -Continue statins.  Concerns about medications: started on Seroquel and as needed Haldol  and Klonopin.  The indication was not clear nor documented.  Patient was upset.  Seroquel, Haldol and Klonopin discontinued.  Has been stable off those medications.  Body mass index is 24.03 kg/m.         DVT prophylaxis:  SCD's Start: 01/22/21 0818 SCDs Start: 01/17/21 2046 Patient is also on IV heparin. Code Status: Full code Family  Communication: Patient and/or RN. Available if any question.  Level of care: Med-Surg Status is: Inpatient  Remains inpatient appropriate because:IV treatments appropriate due to intensity of illness or inability to take PO and Inpatient level of care appropriate due to severity of illness   Dispo: The patient is from: Home              Anticipated d/c is to: Home              Patient currently is not medically stable to d/c.   Difficult to place patient No       Consultants:  Gastroenterology-signed off Neurology over the phone General surgery   Sch Meds:  Scheduled Meds: . acetaminophen  1,000 mg Oral Q6H  . amLODipine  10 mg Oral Daily  . aspirin EC  81 mg Oral Daily  . atorvastatin  20 mg Oral Daily  . bupivacaine liposome  20 mL Infiltration Once  . feeding supplement  1 Container Oral TID WC  . folic acid  1 mg Oral Daily  . lidocaine  1 patch Transdermal Q24H  . lisinopril  2.5 mg Oral Daily  . methocarbamol  500 mg Oral TID  . pantoprazole  40 mg Oral BID   Continuous Infusions:  PRN Meds:.morphine injection, ondansetron **OR** ondansetron (ZOFRAN) IV, oxyCODONE  Antimicrobials: Anti-infectives (From admission, onward)   Start     Dose/Rate Route Frequency Ordered Stop   01/25/21 0600  cefoTEtan (CEFOTAN) 2 g in sodium chloride 0.9 % 100 mL IVPB        2 g 200 mL/hr over 30 Minutes Intravenous On call to O.R. 01/24/21 1405 01/25/21 1110   01/24/21 0600  cefoTEtan (CEFOTAN) 2 g in sodium chloride 0.9 % 100 mL IVPB  Status:  Discontinued        2 g 200 mL/hr over 30 Minutes Intravenous On call to O.R. 01/22/21 0821 01/24/21 1406   01/23/21 1400  neomycin (MYCIFRADIN) tablet 1,000 mg       "And" Linked Group Details   1,000 mg Oral 3 times per day 01/22/21 0821 01/23/21 2233   01/23/21 1400  metroNIDAZOLE (FLAGYL) tablet 1,000 mg       "And" Linked Group Details   1,000 mg Oral 3 times per day 01/22/21 0821 01/23/21 2233       I have personally  reviewed the following labs and images: CBC: Recent Labs  Lab 01/22/21 0745 01/23/21 0448 01/24/21 0430 01/25/21 0351 01/26/21 0435  WBC 5.8 5.5  --   --  9.9  HGB 10.8* 10.1* 10.5* 10.5* 10.7*  HCT 32.6* 31.1* 31.6* 31.9* 31.9*  MCV 80.3 80.2  --   --  78.2*  PLT 257 252  --   --  306   BMP &GFR Recent Labs  Lab 01/25/21 0351 01/26/21 0435  NA 133* 134*  K 4.1 4.2  CL 100 100  CO2 25 23  GLUCOSE 101* 164*  BUN 7* 13  CREATININE 0.98 1.21  CALCIUM 9.3 9.0  MG 2.0 1.9  PHOS  --  4.7*   Estimated Creatinine Clearance: 73.7 mL/min (by  C-G formula based on SCr of 1.21 mg/dL). Liver & Pancreas: Recent Labs  Lab 01/25/21 0351 01/26/21 0435  AST 19  --   ALT 16  --   ALKPHOS 107  --   BILITOT 0.6  --   PROT 7.7  --   ALBUMIN 4.2 3.8   No results for input(s): LIPASE, AMYLASE in the last 168 hours. No results for input(s): AMMONIA in the last 168 hours. Diabetic: No results for input(s): HGBA1C in the last 72 hours. No results for input(s): GLUCAP in the last 168 hours. Cardiac Enzymes: No results for input(s): CKTOTAL, CKMB, CKMBINDEX, TROPONINI in the last 168 hours. No results for input(s): PROBNP in the last 8760 hours. Coagulation Profile: No results for input(s): INR, PROTIME in the last 168 hours. Thyroid Function Tests: No results for input(s): TSH, T4TOTAL, FREET4, T3FREE, THYROIDAB in the last 72 hours. Lipid Profile: No results for input(s): CHOL, HDL, LDLCALC, TRIG, CHOLHDL, LDLDIRECT in the last 72 hours. Anemia Panel: No results for input(s): VITAMINB12, FOLATE, FERRITIN, TIBC, IRON, RETICCTPCT in the last 72 hours. Urine analysis:    Component Value Date/Time   COLORURINE STRAW (A) 12/13/2020 1245   APPEARANCEUR CLEAR 12/13/2020 1245   LABSPEC 1.019 12/13/2020 1245   PHURINE 7.0 12/13/2020 1245   GLUCOSEU NEGATIVE 12/13/2020 1245   HGBUR NEGATIVE 12/13/2020 1245   [place] 12/13/2020 1245   [place] 12/13/2020 1245    PROTEINUR NEGATIVE 12/13/2020 1245   NITRITE NEGATIVE 12/13/2020 1245   LEUKOCYTESUR NEGATIVE 12/13/2020 1245   Sepsis Labs: Invalid input(s): PROCALCITONIN, [place]  Microbiology: Recent Results (from the past 240 hour(s))  Resp Panel by RT-PCR (Flu A&B, Covid) Nasopharyngeal Swab     Status: None   Collection Time: 01/17/21  3:47 PM   Specimen: Nasopharyngeal Swab; Nasopharyngeal(NP) swabs in vial transport medium  Result Value Ref Range Status   SARS Coronavirus 2 by RT PCR NEGATIVE NEGATIVE Final    Comment: (NOTE) SARS-CoV-2 target nucleic acids are NOT DETECTED.  The SARS-CoV-2 RNA is generally detectable in upper respiratory specimens during the acute phase of infection. The lowest concentration of SARS-CoV-2 viral copies this assay can detect is 138 copies/mL. A negative result does not preclude SARS-Cov-2 infection and should not be used as the sole basis for treatment or other patient management decisions. A negative result may occur with  improper specimen collection/handling, submission of specimen other than nasopharyngeal swab, presence of viral mutation(s) within the areas targeted by this assay, and inadequate number of viral copies(<138 copies/mL). A negative result must be combined with clinical observations, patient history, and epidemiological information. The expected result is Negative.  Fact Sheet for Patients:  EntrepreneurPulse.com.au  Fact Sheet for Healthcare Providers:  IncredibleEmployment.be  This test is no t yet approved or cleared by the [place] FDA and  has been authorized for detection and/or diagnosis of SARS-CoV-2 by FDA under an Emergency Use Authorization (EUA). This EUA will remain  in effect (meaning this test can be used) for the duration of the COVID-19 declaration under Section 564(b)(1) of the Act, 21 U.S.C.section 360bbb-3(b)(1), unless the authorization is terminated  or revoked  sooner.       Influenza A by PCR NEGATIVE NEGATIVE Final   Influenza B by PCR NEGATIVE NEGATIVE Final    Comment: (NOTE) The Xpert Xpress SARS-CoV-2/FLU/RSV plus assay is intended as an aid in the diagnosis of influenza from Nasopharyngeal swab specimens and should not be used as a sole basis for treatment. Nasal washings  and aspirates are unacceptable for Xpert Xpress SARS-CoV-2/FLU/RSV testing.  Fact Sheet for Patients: EntrepreneurPulse.com.au  Fact Sheet for Healthcare Providers: IncredibleEmployment.be  This test is not yet approved or cleared by the [place] FDA and has been authorized for detection and/or diagnosis of SARS-CoV-2 by FDA under an Emergency Use Authorization (EUA). This EUA will remain in effect (meaning this test can be used) for the duration of the COVID-19 declaration under Section 564(b)(1) of the Act, 21 U.S.C. section 360bbb-3(b)(1), unless the authorization is terminated or revoked.  Performed at [facility], [address] [place], [place] [address]   Surgical pcr screen     Status: None   Collection Time: 01/24/21  2:56 AM   Specimen: Nasal Mucosa; Nasal Swab  Result Value Ref Range Status   MRSA, PCR NEGATIVE NEGATIVE Final   Staphylococcus aureus NEGATIVE NEGATIVE Final    Comment: (NOTE) The Xpert SA Assay (FDA approved for NASAL specimens in patients 42 years of age and older), is one component of a comprehensive surveillance program. It is not intended to diagnose infection nor to guide or monitor treatment. Performed at [facility], [place] [address], [place], [place] [address]     Radiology Studies: No results found.    Cathlin Buchan T. [place]  If 7PM-7AM, please contact night-coverage [email or web address] 01/26/2021, 4:39 PM

## 2021-01-26 NOTE — Progress Notes (Signed)
1 Day Post-Op  Subjective: Having some pain when he moves.  Some nausea and not taking in much liquids.  No flatus or BM yet.  ROS: See above, otherwise other systems negative  Objective: Vital signs in last 24 hours: Temp:  [97.7 F (36.5 C)-99.5 F (37.5 C)] 98.9 F (37.2 C) (04/13 0543) Pulse Rate:  [75-101] 95 (04/13 0543) Resp:  [11-18] 17 (04/13 0543) BP: (110-170)/(66-94) 129/81 (04/13 0543) SpO2:  [92 %-100 %] 100 % (04/13 0543) Last BM Date: 01/24/21  Intake/Output from previous day: 04/12 0701 - 04/13 0700 In: 1440 [P.O.:240; I.V.:1100; IV Piggyback:100] Out: 1705 [Urine:1675; Blood:30] Intake/Output this shift: No intake/output data recorded.  PE: Abd: soft, appropriately tender, fairly quiet, midline incision is c/d/i with staples and honeycomb dressing in place as well as lap incisions covered with gauze and tegaderms.  Lab Results:  Recent Labs    01/25/21 0351 01/26/21 0435  WBC  --  9.9  HGB 10.5* 10.7*  HCT 31.9* 31.9*  PLT  --  306   BMET Recent Labs    01/25/21 0351 01/26/21 0435  NA 133* 134*  K 4.1 4.2  CL 100 100  CO2 25 23  GLUCOSE 101* 164*  BUN 7* 13  CREATININE 0.98 1.21  CALCIUM 9.3 9.0   PT/INR No results for input(s): LABPROT, INR in the last 72 hours. CMP     Component Value Date/Time   NA 134 (L) 01/26/2021 0435   K 4.2 01/26/2021 0435   CL 100 01/26/2021 0435   CO2 23 01/26/2021 0435   GLUCOSE 164 (H) 01/26/2021 0435   BUN 13 01/26/2021 0435   CREATININE 1.21 01/26/2021 0435   CALCIUM 9.0 01/26/2021 0435   PROT 7.7 01/25/2021 0351   ALBUMIN 3.8 01/26/2021 0435   AST 19 01/25/2021 0351   ALT 16 01/25/2021 0351   ALKPHOS 107 01/25/2021 0351   BILITOT 0.6 01/25/2021 0351   GFRNONAA >60 01/26/2021 0435   Lipase  No results found for: LIPASE     Studies/Results: No results found.  Anti-infectives: Anti-infectives (From admission, onward)   Start     Dose/Rate Route Frequency Ordered Stop   01/25/21  0600  cefoTEtan (CEFOTAN) 2 g in sodium chloride 0.9 % 100 mL IVPB        2 g 200 mL/hr over 30 Minutes Intravenous On call to O.R. 01/24/21 1405 01/25/21 1110   01/24/21 0600  cefoTEtan (CEFOTAN) 2 g in sodium chloride 0.9 % 100 mL IVPB  Status:  Discontinued        2 g 200 mL/hr over 30 Minutes Intravenous On call to O.R. 01/22/21 0821 01/24/21 1406   01/23/21 1400  neomycin (MYCIFRADIN) tablet 1,000 mg       "And" Linked Group Details   1,000 mg Oral 3 times per day 01/22/21 0821 01/23/21 2233   01/23/21 1400  metroNIDAZOLE (FLAGYL) tablet 1,000 mg       "And" Linked Group Details   1,000 mg Oral 3 times per day 01/22/21 8676 01/23/21 2233       Assessment/Plan HTN Former heavy smoker - quit 11/2020 Alcohol dependence Polysubstance abuse - THC, cocaine (most recently 1 month ago) Anemia  Recent left ICA and left MCA/M2 infarction s/p mechanical thrombectomy and left carotid stenting and angioplasty 12/13/20 on aspirin and brillinta (last dose 01/17/21 in AM)  - per neurology "it is ideal to have some antiplatelet on board for at least 6 weeks to prevent stent thrombosis. But  in his case, he requires a procedure urgently and emergently. He should be on a heparin drip-better than being on nothing and then antiplatelets resumed as soon as able postsurgically." -hgb stable, if remains stable tomorrow can resume Brilinta at that time  POD 1, s/p lap converted to open R hemicolectomy forPartially obstructing tumor in the cecum, Dr. [name] 01/25/21 - cont CLD today due to nausea -needs to mobilize.  PT/OT/SLP consults placed -can likely restart Brilinta tomorrow as long as hgb remains stable. -IS/pulm toilet -path pending  ID - none currently VTE - SCDs, ASA, likely to restart Brilinta tomorrow FEN - CLD, Boost breeze Foley - none Follow up - Dr. [name]   LOS: 7 days    Dustin Golden , [facility] Surgery 01/26/2021, 8:50 AM Please see Amion for pager number  during day hours 7:00am-4:30pm or 7:00am -11:30am on weekends

## 2021-01-26 NOTE — Evaluation (Signed)
SLP Cancellation Note  Patient Details Name: Dustin Golden MRN: [id number] DOB: 12-13-1955   Cancelled treatment:       Reason Eval/Treat Not Completed: Other (comment) (RN reports pt is lethargic due to medications for pain and nausea, will continue efforts)  Kathleen Lime, MS [facility] SLP Acute Rehab Services Office [phone] Pager [phone]   [name] 01/26/2021, 2:57 PM

## 2021-01-26 NOTE — Evaluation (Signed)
Physical Therapy Re-Evaluation Patient Details Name: Dustin Golden MRN: [id number] DOB: 06/17/1956 Today's Date: 01/26/2021   History of Present Illness  Pt admitted with melena and anemia.  Colonoscopy on 4/7 with ulcerated cecal mass.  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  Pathology with high-grade dysplasia.  General surgery consulted   s/p lap converted to open R hemicolectomy for Partially obstructing tumor in the cecum per  Dr. [name] on  01/25/21. PMHx: recent CVA with residual mild R side weakness, mild right side inattention/awarenss and dysarthria  Clinical Impression  Pt admitted with above diagnosis.  Pt not feeling well, nauseous but willing to attempt mobility.  Unable to stand d/t dizziness, BP 97/73 in sitting. Will continue to follow in acute setting.  Pt currently with functional limitations due to the deficits listed below (see PT Problem List). Pt will benefit from skilled PT to increase their independence and safety with mobility to allow discharge to the venue listed below.       Follow Up Recommendations Home health PT;No PT follow up (vs)    Equipment Recommendations  None recommended by PT    Recommendations for Other Services       Precautions / Restrictions        Mobility  Bed Mobility Overal bed mobility: Needs Assistance Bed Mobility: Rolling;Sidelying to Sit;Sit to Sidelying Rolling: Min assist Sidelying to sit: Min assist     Sit to sidelying: Min assist General bed mobility comments: assist to complete roll, light assist to elevate trunk and control descent    Transfers                 General transfer comment: deferred d/t pt dizzy in sitting; BP 97/73 in sitting  Ambulation/Gait                Stairs            Wheelchair Mobility    Modified Rankin (Stroke Patients Only)       Balance Overall balance assessment: Needs  assistance Sitting-balance support: Single extremity supported;Feet supported Sitting balance-Leahy Scale: Fair         Standing balance comment: NT d/t dizziness                             Pertinent Vitals/Pain Pain Assessment: Faces Faces Pain Scale: Hurts little more Pain Location: abd Pain Descriptors / Indicators: Discomfort;Grimacing Pain Intervention(s): Limited activity within patient's tolerance;Monitored during session;Repositioned    Home Living Family/patient expects to be discharged to:: Private residence Living Arrangements: Spouse/significant other Available Help at Discharge: Family;Available 24 hours/day Type of Home: House Home Access: Stairs to enter   [employer] of Steps: 1 Home Layout: One level;Other (Comment);Laundry or work area in [place]: Radio producer - single point;Crutches      Prior Function Level of Independence: Independent         Comments: States walking 2 miles a day prior to this admit     Hand Dominance        Extremity/Trunk Assessment   Upper Extremity Assessment Upper Extremity Assessment: RUE deficits/detail RUE Deficits / Details: NT formally, pt reports minimal baseline weakness RUE Coordination: decreased fine motor    Lower Extremity Assessment Lower Extremity Assessment: RLE deficits/detail RLE Coordination: decreased fine motor    Cervical / Trunk Assessment Cervical / Trunk Assessment: Normal  Communication  Communication: Expressive difficulties  Cognition Arousal/Alertness: Awake/alert Behavior During Therapy: WFL for tasks assessed/performed;Flat affect Overall Cognitive Status: Within Functional Limits for tasks assessed                                        General Comments      Exercises     Assessment/Plan    PT Assessment Patient needs continued PT services  PT Problem List Decreased strength;Decreased activity tolerance;Decreased  knowledge of use of DME;Decreased mobility;Decreased balance       PT Treatment Interventions DME instruction;Therapeutic activities;Gait training;Functional mobility training;Therapeutic exercise;Patient/family education;Balance training    PT Goals (Current goals can be found in the Care Plan section)  Acute Rehab PT Goals Patient Stated Goal: Survive this latest health issue PT Goal Formulation: With patient Time For Goal Achievement: 02/09/21 Potential to Achieve Goals: Good    Frequency Min 3X/week   Barriers to discharge        Co-evaluation               AM-PAC PT "6 Clicks" Mobility  Outcome Measure Help needed turning from your back to your side while in a flat bed without using bedrails?: A Little Help needed moving from lying on your back to sitting on the side of a flat bed without using bedrails?: A Little Help needed moving to and from a bed to a chair (including a wheelchair)?: Total Help needed standing up from a chair using your arms (e.g., wheelchair or bedside chair)?: Total Help needed to walk in hospital room?: Total Help needed climbing 3-5 steps with a railing? : Total 6 Click Score: 10    End of Session   Activity Tolerance: Treatment limited secondary to medical complications (Comment) (low BP) Patient left: in bed;with call bell/phone within reach;with bed alarm set Nurse Communication: Mobility status PT Visit Diagnosis: Unsteadiness on feet (R26.81)    Time: 1520-1540 PT Time Calculation (min) (ACUTE ONLY): 20 min   Charges:   PT Evaluation $PT Re-evaluation: 1 Re-eval          Blue Ruggerio, PT  Acute Rehab Dept ([place]) [phone] Pager [phone]  01/26/2021   [facility] 01/26/2021, 4:14 PM

## 2021-01-27 ENCOUNTER — Other Ambulatory Visit: Payer: Self-pay

## 2021-01-27 ENCOUNTER — Inpatient Hospital Stay (HOSPITAL_COMMUNITY): Payer: Commercial Managed Care - PPO

## 2021-01-27 DIAGNOSIS — R066 Hiccough: Secondary | ICD-10-CM

## 2021-01-27 DIAGNOSIS — R112 Nausea with vomiting, unspecified: Secondary | ICD-10-CM

## 2021-01-27 DIAGNOSIS — K567 Ileus, unspecified: Secondary | ICD-10-CM

## 2021-01-27 DIAGNOSIS — E871 Hypo-osmolality and hyponatremia: Secondary | ICD-10-CM

## 2021-01-27 DIAGNOSIS — I10 Essential (primary) hypertension: Secondary | ICD-10-CM | POA: Diagnosis not present

## 2021-01-27 DIAGNOSIS — C182 Malignant neoplasm of ascending colon: Secondary | ICD-10-CM | POA: Diagnosis not present

## 2021-01-27 DIAGNOSIS — K9189 Other postprocedural complications and disorders of digestive system: Secondary | ICD-10-CM

## 2021-01-27 DIAGNOSIS — K921 Melena: Secondary | ICD-10-CM | POA: Diagnosis not present

## 2021-01-27 DIAGNOSIS — D72825 Bandemia: Secondary | ICD-10-CM

## 2021-01-27 DIAGNOSIS — K922 Gastrointestinal hemorrhage, unspecified: Secondary | ICD-10-CM | POA: Diagnosis not present

## 2021-01-27 LAB — CBC
HCT: 36.4 % — ABNORMAL LOW (ref 39.0–52.0)
Hemoglobin: 12.3 g/dL — ABNORMAL LOW (ref 13.0–17.0)
MCH: 26.3 pg (ref 26.0–34.0)
MCHC: 33.8 g/dL (ref 30.0–36.0)
MCV: 77.9 fL — ABNORMAL LOW (ref 80.0–100.0)
Platelets: 399 10*3/uL (ref 150–400)
RBC: 4.67 MIL/uL (ref 4.22–5.81)
RDW: 13.4 % (ref 11.5–15.5)
WBC: 13.5 10*3/uL — ABNORMAL HIGH (ref 4.0–10.5)
nRBC: 0 % (ref 0.0–0.2)

## 2021-01-27 MED ORDER — ACETAMINOPHEN 10 MG/ML IV SOLN
1000.0000 mg | Freq: Four times a day (QID) | INTRAVENOUS | Status: AC
  Administered 2021-01-27 – 2021-01-28 (×4): 1000 mg via INTRAVENOUS
  Filled 2021-01-27 (×4): qty 100

## 2021-01-27 MED ORDER — MAGIC MOUTHWASH
15.0000 mL | Freq: Four times a day (QID) | ORAL | Status: DC | PRN
  Filled 2021-01-27: qty 15

## 2021-01-27 MED ORDER — DEXTROSE-NACL 5-0.9 % IV SOLN: INTRAVENOUS | Status: DC

## 2021-01-27 MED ORDER — METHOCARBAMOL 1000 MG/10ML IJ SOLN
500.0000 mg | Freq: Three times a day (TID) | INTRAVENOUS | Status: DC
  Administered 2021-01-27 – 2021-01-31 (×11): 500 mg via INTRAVENOUS
  Filled 2021-01-27 (×7): qty 500
  Filled 2021-01-27: qty 5
  Filled 2021-01-27 (×3): qty 500

## 2021-01-27 MED ORDER — LIP MEDEX EX OINT
1.0000 "application " | TOPICAL_OINTMENT | Freq: Two times a day (BID) | CUTANEOUS | Status: DC
  Administered 2021-01-27 – 2021-02-07 (×21): 1 via TOPICAL
  Filled 2021-01-27 (×3): qty 7

## 2021-01-27 MED ORDER — MENTHOL 3 MG MT LOZG: 1.0000 | LOZENGE | OROMUCOSAL | Status: DC | PRN

## 2021-01-27 MED ORDER — SODIUM CHLORIDE 0.9 % IV BOLUS
500.0000 mL | Freq: Once | INTRAVENOUS | Status: AC
  Administered 2021-01-27: 500 mL via INTRAVENOUS

## 2021-01-27 MED ORDER — PANTOPRAZOLE SODIUM 40 MG IV SOLR
40.0000 mg | INTRAVENOUS | Status: DC
  Administered 2021-01-27 – 2021-02-06 (×11): 40 mg via INTRAVENOUS
  Filled 2021-01-27 (×11): qty 40

## 2021-01-27 MED ORDER — SODIUM CHLORIDE 0.9 % IV SOLN
25.0000 mg | Freq: Four times a day (QID) | INTRAVENOUS | Status: DC | PRN
  Administered 2021-01-27 – 2021-02-01 (×5): 25 mg via INTRAVENOUS
  Filled 2021-01-27 (×5): qty 1

## 2021-01-27 MED ORDER — SIMETHICONE 80 MG PO CHEW
80.0000 mg | CHEWABLE_TABLET | Freq: Four times a day (QID) | ORAL | Status: AC
  Administered 2021-01-29 (×3): 80 mg via ORAL
  Filled 2021-01-27 (×3): qty 1

## 2021-01-27 MED ORDER — PHENOL 1.4 % MT LIQD
2.0000 | OROMUCOSAL | Status: DC | PRN
  Filled 2021-01-27: qty 177

## 2021-01-27 MED ORDER — LABETALOL HCL 5 MG/ML IV SOLN
10.0000 mg | INTRAVENOUS | Status: DC | PRN
  Filled 2021-01-27: qty 4

## 2021-01-27 MED ORDER — ALUM & MAG HYDROXIDE-SIMETH 200-200-20 MG/5ML PO SUSP
30.0000 mL | Freq: Four times a day (QID) | ORAL | Status: DC | PRN
  Administered 2021-01-30: 30 mL via ORAL
  Filled 2021-01-27: qty 30

## 2021-01-27 NOTE — Progress Notes (Signed)
PROGRESS NOTE  Dustin Golden NTZ:[id number] DOB: [birth year]   PCP: [name], PA  Patient is from: Home  DOA: 01/17/2021 LOS: 8  Chief complaints: Rectal bleed  Brief Narrative / Interim history: [name] with PMH of recent left ACA and MCA CVA s/p left ICA thrombectomy followed by vascular stenting with mild residual upper and lower extremity weakness and dysarthria on DAPT with aspirin and Brilinta presenting with BRBPR.  EGD on 4/6 with mild erythematous mucosa in duodenum.  Colonoscopy on 4/7 with ulcerated cecal mass.  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  Pathology with high-grade dysplasia.  Brilinta on hold.  Patient underwent laparotomy with right hemicolectomy on 01/25/2021.  Postop course complicated by ileus requiring NG tube placement.  Subjective: Seen and examined earlier this morning.  Reports abdominal pain, he cough and emesis.  No flatus or bowel movement.  He denies chest pain or shortness of breath.  Objective: Vitals:   01/26/21 1353 01/26/21 2122 01/27/21 0449 01/27/21 1254  BP: (!) 145/97 123/90 114/82 (!) 82/65  Pulse: 100 (!) 110 (!) 56 100  Resp: 18 17 16 16   Temp: 97.7 F (36.5 C) 98.9 F (37.2 C) (!) 97.5 F (36.4 C) 98.4 F (36.9 C)  TempSrc: Oral Oral Oral Axillary  SpO2: 95% 96% 95% 95%  Weight:      Height:        Intake/Output Summary (Last 24 hours) at 01/27/2021 1430 Last data filed at 01/27/2021 1255 Gross per 24 hour  Intake 266.01 ml  Output 1250 ml  Net -983.99 ml   Filed Weights   01/17/21 1352 01/20/21 1135  Weight: 87.2 kg 87.2 kg    Examination:  GENERAL: Appears to be in pain HEENT: MMM.  Vision and hearing grossly intact.  NECK: Supple.  No apparent JVD.  RESP:  No IWOB.  Fair aeration bilaterally. CVS:  RRR. Heart sounds normal.  ABD/GI/GU: No BS.  Slightly distended.  Mild discomfort with palpation.  No rebound or guarding.   Honeycomb dressing with scant dried blood. MSK/EXT:  Moves extremities. No apparent deformity. No edema.  SKIN: no apparent skin lesion or wound NEURO: Awake but not quite alert.  Appropriately oriented.  No apparent focal neuro deficit. PSYCH: Appears to be in pain.  Procedures:  4/6-EGD with erythematous mucosa and duodenum 4/7-colonoscopy with fungating mass in cecum.  Pathology with high-grade dysplasia.  Microbiology summarized: COVID-19 and influenza PCR nonreactive.  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 Postop ileus-clinical no radiological evidence of this. Nausea/vomiting/hiccups-likely due to postop ileus. -General surgery recommendations  -NG tube and antiemetics including Compazine  -needs to mobilize.  PT/OT/SLP consults placed  -IS/pulm toilet  -Follow surgical pathology  -NS bolus 500 cc followed by D5-NS at 125 cc an hour  -Changed meds to IV  Hematochezia: Likely due to the above.  Hgb up via 2 g likely from dehydration. Recent Labs    12/20/20 0439 01/17/21 1444 01/18/21 0506 01/19/21 0415 01/22/21 0745 01/23/21 0448 01/24/21 0430 01/25/21 0351 01/26/21 0435 01/27/21 0420  HGB 12.5* 11.2* 10.8* 10.4* 10.8* 10.1* 10.5* 10.5* 10.7* 12.3*  -Monitor H&H  Recent left ACA and MCA/M2 stroke s/p left ICA mechanical thrombectomy followed by rescue stenting with mid residual upper and lower extremity weakness and dysarthria: Stable. -Holding DAPT in the setting of ileus.  Will restart once he is able  to take p.o.  Essential hypertension: Hypotensive -Hold p.o. meds -IV fluid as above  Hyponatremia: Stable. -IV fluid as above  Leukocytosis: Likely demargination. -Continue monitoring  Hyperlipidemia: Stable -Restart statin once able to take p.o.  Concerns about medications: started on Seroquel and as needed Haldol and Klonopin.  The indication was not clear nor documented.   Patient was upset.  Seroquel, Haldol and Klonopin discontinued.  Has been stable off those medications.  Body mass index is 24.03 kg/m.         DVT prophylaxis:  SCD's Start: 01/22/21 0818 SCDs Start: 01/17/21 2046 Patient is also on IV heparin. Code Status: Full code Family Communication: Patient and/or RN. Available if any question.  Level of care: Med-Surg Status is: Inpatient  Remains inpatient appropriate because:IV treatments appropriate due to intensity of illness or inability to take PO and Inpatient level of care appropriate due to severity of illness   Dispo: The patient is from: Home              Anticipated d/c is to: Home              Patient currently is not medically stable to d/c.   Difficult to place patient No       Consultants:  Gastroenterology-signed off Neurology over the phone General surgery   Sch Meds:  Scheduled Meds: . bupivacaine liposome  20 mL Infiltration Once  . feeding supplement  1 Container Oral TID WC  . lidocaine  1 patch Transdermal Q24H  . lip balm  1 application Topical BID  . pantoprazole (PROTONIX) IV  40 mg Intravenous Q24H  . simethicone  80 mg Oral QID   Continuous Infusions: . acetaminophen 1,000 mg (01/27/21 1248)  . chlorproMAZINE (THORAZINE) IV 25 mg (01/27/21 6222)  . methocarbamol (ROBAXIN) IV 500 mg (01/27/21 1324)   PRN Meds:.alum & mag hydroxide-simeth, chlorproMAZINE (THORAZINE) IV, labetalol, magic mouthwash, menthol-cetylpyridinium, morphine injection, ondansetron **OR** ondansetron (ZOFRAN) IV, phenol  Antimicrobials: Anti-infectives (From admission, onward)   Start     Dose/Rate Route Frequency Ordered Stop   01/25/21 0600  cefoTEtan (CEFOTAN) 2 g in sodium chloride 0.9 % 100 mL IVPB        2 g 200 mL/hr over 30 Minutes Intravenous On call to O.R. 01/24/21 1405 01/25/21 1110   01/24/21 0600  cefoTEtan (CEFOTAN) 2 g in sodium chloride 0.9 % 100 mL IVPB  Status:  Discontinued        2 g 200 mL/hr over  30 Minutes Intravenous On call to O.R. 01/22/21 0821 01/24/21 1406   01/23/21 1400  neomycin (MYCIFRADIN) tablet 1,000 mg       "And" Linked Group Details   1,000 mg Oral 3 times per day 01/22/21 0821 01/23/21 2233   01/23/21 1400  metroNIDAZOLE (FLAGYL) tablet 1,000 mg       "And" Linked Group Details   1,000 mg Oral 3 times per day 01/22/21 0821 01/23/21 2233       I have personally reviewed the following labs and images: CBC: Recent Labs  Lab 01/22/21 0745 01/23/21 0448 01/24/21 0430 01/25/21 0351 01/26/21 0435 01/27/21 0420  WBC 5.8 5.5  --   --  9.9 13.5*  HGB 10.8* 10.1* 10.5* 10.5* 10.7* 12.3*  HCT 32.6* 31.1* 31.6* 31.9* 31.9* 36.4*  MCV 80.3 80.2  --   --  78.2* 77.9*  PLT 257 252  --   --  306 399   BMP &GFR Recent Labs  Lab 01/25/21 0351  01/26/21 0435  NA 133* 134*  K 4.1 4.2  CL 100 100  CO2 25 23  GLUCOSE 101* 164*  BUN 7* 13  CREATININE 0.98 1.21  CALCIUM 9.3 9.0  MG 2.0 1.9  PHOS  --  4.7*   Estimated Creatinine Clearance: 73.7 mL/min (by C-G formula based on SCr of 1.21 mg/dL). Liver & Pancreas: Recent Labs  Lab 01/25/21 0351 01/26/21 0435  AST 19  --   ALT 16  --   ALKPHOS 107  --   BILITOT 0.6  --   PROT 7.7  --   ALBUMIN 4.2 3.8   No results for input(s): LIPASE, AMYLASE in the last 168 hours. No results for input(s): AMMONIA in the last 168 hours. Diabetic: No results for input(s): HGBA1C in the last 72 hours. No results for input(s): GLUCAP in the last 168 hours. Cardiac Enzymes: No results for input(s): CKTOTAL, CKMB, CKMBINDEX, TROPONINI in the last 168 hours. No results for input(s): PROBNP in the last 8760 hours. Coagulation Profile: No results for input(s): INR, PROTIME in the last 168 hours. Thyroid Function Tests: No results for input(s): TSH, T4TOTAL, FREET4, T3FREE, THYROIDAB in the last 72 hours. Lipid Profile: No results for input(s): CHOL, HDL, LDLCALC, TRIG, CHOLHDL, LDLDIRECT in the last 72 hours. Anemia  Panel: No results for input(s): VITAMINB12, FOLATE, FERRITIN, TIBC, IRON, RETICCTPCT in the last 72 hours. Urine analysis:    Component Value Date/Time   COLORURINE STRAW (A) 12/13/2020 1245   APPEARANCEUR CLEAR 12/13/2020 1245   LABSPEC 1.019 12/13/2020 1245   PHURINE 7.0 12/13/2020 1245   GLUCOSEU NEGATIVE 12/13/2020 1245   HGBUR NEGATIVE 12/13/2020 1245   [place] 12/13/2020 1245   [place] 12/13/2020 1245   PROTEINUR NEGATIVE 12/13/2020 1245   NITRITE NEGATIVE 12/13/2020 1245   LEUKOCYTESUR NEGATIVE 12/13/2020 1245   Sepsis Labs: Invalid input(s): PROCALCITONIN, [place]  Microbiology: Recent Results (from the past 240 hour(s))  Resp Panel by RT-PCR (Flu A&B, Covid) Nasopharyngeal Swab     Status: None   Collection Time: 01/17/21  3:47 PM   Specimen: Nasopharyngeal Swab; Nasopharyngeal(NP) swabs in vial transport medium  Result Value Ref Range Status   SARS Coronavirus 2 by RT PCR NEGATIVE NEGATIVE Final    Comment: (NOTE) SARS-CoV-2 target nucleic acids are NOT DETECTED.  The SARS-CoV-2 RNA is generally detectable in upper respiratory specimens during the acute phase of infection. The lowest concentration of SARS-CoV-2 viral copies this assay can detect is 138 copies/mL. A negative result does not preclude SARS-Cov-2 infection and should not be used as the sole basis for treatment or other patient management decisions. A negative result may occur with  improper specimen collection/handling, submission of specimen other than nasopharyngeal swab, presence of viral mutation(s) within the areas targeted by this assay, and inadequate number of viral copies(<138 copies/mL). A negative result must be combined with clinical observations, patient history, and epidemiological information. The expected result is Negative.  Fact Sheet for Patients:  EntrepreneurPulse.com.au  Fact Sheet for Healthcare Providers:   IncredibleEmployment.be  This test is no t yet approved or cleared by the [place] FDA and  has been authorized for detection and/or diagnosis of SARS-CoV-2 by FDA under an Emergency Use Authorization (EUA). This EUA will remain  in effect (meaning this test can be used) for the duration of the COVID-19 declaration under Section 564(b)(1) of the Act, 21 U.S.C.section 360bbb-3(b)(1), unless the authorization is terminated  or revoked sooner.       Influenza A  by PCR NEGATIVE NEGATIVE Final   Influenza B by PCR NEGATIVE NEGATIVE Final    Comment: (NOTE) The Xpert Xpress SARS-CoV-2/FLU/RSV plus assay is intended as an aid in the diagnosis of influenza from Nasopharyngeal swab specimens and should not be used as a sole basis for treatment. Nasal washings and aspirates are unacceptable for Xpert Xpress SARS-CoV-2/FLU/RSV testing.  Fact Sheet for Patients: EntrepreneurPulse.com.au  Fact Sheet for Healthcare Providers: IncredibleEmployment.be  This test is not yet approved or cleared by the [place] FDA and has been authorized for detection and/or diagnosis of SARS-CoV-2 by FDA under an Emergency Use Authorization (EUA). This EUA will remain in effect (meaning this test can be used) for the duration of the COVID-19 declaration under Section 564(b)(1) of the Act, 21 U.S.C. section 360bbb-3(b)(1), unless the authorization is terminated or revoked.  Performed at [facility], [address] [place], [place] [address]   Surgical pcr screen     Status: None   Collection Time: 01/24/21  2:56 AM   Specimen: Nasal Mucosa; Nasal Swab  Result Value Ref Range Status   MRSA, PCR NEGATIVE NEGATIVE Final   Staphylococcus aureus NEGATIVE NEGATIVE Final    Comment: (NOTE) The Xpert SA Assay (FDA approved for NASAL specimens in patients 26 years of age and older), is one component of a comprehensive surveillance  program. It is not intended to diagnose infection nor to guide or monitor treatment. Performed at [facility], [place] [address], [place], [place] [address]     Radiology Studies: DG Abd Portable 1V  Result Date: 01/27/2021 CLINICAL DATA:  Check gastric catheter placement EXAM: PORTABLE ABDOMEN - 1 VIEW COMPARISON:  Film from earlier in the same day. FINDINGS: Stable small bowel dilatation is noted. Gastric catheter is now noted within the stomach. No free air is seen. IMPRESSION: Gastric catheter within the stomach. Electronically Signed   By: Inez Catalina M.D.   On: 01/27/2021 13:43   DG Abd Portable 1V  Result Date: 01/27/2021 CLINICAL DATA:  Abdominal pain and rectal bleeding EXAM: PORTABLE ABDOMEN - 1 VIEW COMPARISON:  January 20, 2021 CT abdomen and pelvis FINDINGS: There are multiple loops of dilated small bowel without appreciable air-fluid levels. No free air evident on supine examination. Lung bases are clear. There are multiple skin staples. IMPRESSION: Bowel dilatation, most likely representing postoperative ileus. A degree of bowel obstruction cannot be excluded in this circumstance. No free air. Lung bases clear. Electronically Signed   By: Lowella Grip III M.D.   On: 01/27/2021 09:45      Trine Fread T. [place]  If 7PM-7AM, please contact night-coverage [email or web address] 01/27/2021, 2:30 PM

## 2021-01-27 NOTE — Progress Notes (Signed)
SLP Cancellation Note  Patient Details Name: Dustin Golden MRN: [id number] DOB: [birth year]   Cancelled treatment:       Reason Eval/Treat Not Completed: Other (comment) (Per RN, pt may have ileus and is npo.  Will continue efforts when pt allowed po. Thanks.)  Kathleen Lime, MS [facility] SLP Acute Rehab Services Office [phone] Pager [phone]   [name] 01/27/2021, 8:43 AM

## 2021-01-27 NOTE — Progress Notes (Addendum)
2 Days Post-Op  Subjective: Hiccups and nausea/voming for almost 24h. Denies flatus or BM. Is very uncomfortable.   ROS: See above, otherwise other systems negative  Objective: Vital signs in last 24 hours: Temp:  [97.5 F (36.4 C)-98.9 F (37.2 C)] 97.5 F (36.4 C) (04/14 0449) Pulse Rate:  [56-110] 56 (04/14 0449) Resp:  [16-18] 16 (04/14 0449) BP: (114-145)/(82-97) 114/82 (04/14 0449) SpO2:  [95 %-96 %] 95 % (04/14 0449) Last BM Date: 01/24/21  Intake/Output from previous day: 04/13 0701 - 04/14 0700 In: 325 [P.O.:300; IV Piggyback:25] Out: 1800 [Urine:1350; Emesis/NG output:450] Intake/Output this shift: No intake/output data recorded.  PE: Gen - alert, cooperative, appears uncomfortable but non-toxic Abd - soft, moderately distended with hypoactive BS, honeycomb dressing with scant sanguinous strikethorugh/old blood.   Lab Results:  Recent Labs    01/26/21 0435 01/27/21 0420  WBC 9.9 13.5*  HGB 10.7* 12.3*  HCT 31.9* 36.4*  PLT 306 399   BMET Recent Labs    01/25/21 0351 01/26/21 0435  NA 133* 134*  K 4.1 4.2  CL 100 100  CO2 25 23  GLUCOSE 101* 164*  BUN 7* 13  CREATININE 0.98 1.21  CALCIUM 9.3 9.0   PT/INR No results for input(s): LABPROT, INR in the last 72 hours. CMP     Component Value Date/Time   NA 134 (L) 01/26/2021 0435   K 4.2 01/26/2021 0435   CL 100 01/26/2021 0435   CO2 23 01/26/2021 0435   GLUCOSE 164 (H) 01/26/2021 0435   BUN 13 01/26/2021 0435   CREATININE 1.21 01/26/2021 0435   CALCIUM 9.0 01/26/2021 0435   PROT 7.7 01/25/2021 0351   ALBUMIN 3.8 01/26/2021 0435   AST 19 01/25/2021 0351   ALT 16 01/25/2021 0351   ALKPHOS 107 01/25/2021 0351   BILITOT 0.6 01/25/2021 0351   GFRNONAA >60 01/26/2021 0435   Lipase  No results found for: LIPASE     Studies/Results: No results found.  Anti-infectives: Anti-infectives (From admission, onward)   Start     Dose/Rate Route Frequency Ordered Stop   01/25/21 0600   cefoTEtan (CEFOTAN) 2 g in sodium chloride 0.9 % 100 mL IVPB        2 g 200 mL/hr over 30 Minutes Intravenous On call to O.R. 01/24/21 1405 01/25/21 1110   01/24/21 0600  cefoTEtan (CEFOTAN) 2 g in sodium chloride 0.9 % 100 mL IVPB  Status:  Discontinued        2 g 200 mL/hr over 30 Minutes Intravenous On call to O.R. 01/22/21 0821 01/24/21 1406   01/23/21 1400  neomycin (MYCIFRADIN) tablet 1,000 mg       "And" Linked Group Details   1,000 mg Oral 3 times per day 01/22/21 0821 01/23/21 2233   01/23/21 1400  metroNIDAZOLE (FLAGYL) tablet 1,000 mg       "And" Linked Group Details   1,000 mg Oral 3 times per day 01/22/21 9935 01/23/21 2233       Assessment/Plan HTN Former heavy smoker - quit 11/2020 Alcohol dependence Polysubstance abuse - THC, cocaine (most recently 1 month ago) Anemia  Recent left ICA and left MCA/M2 infarction s/p mechanical thrombectomy and left carotid stenting and angioplasty 12/13/20 on aspirin and brillinta (last dose 01/17/21 in AM)  - per neurology "it is ideal to have some antiplatelet on board for at least 6 weeks to prevent stent thrombosis. But in his case, he requires a procedure urgently and emergently. He should be on  a heparin drip-better than being on nothing and then antiplatelets resumed as soon as able postsurgically." -hgb stable, if remains stable tomorrow can resume Brilinta at that time  POD 2, s/p lap converted to open R hemicolectomy forPartially obstructing tumor in the cecum, Dr. [name] 01/25/21 - post-op ileus clinically and on KUB, place NG tube to LIWS -needs to mobilize.  PT/OT/SLP consults placed - H&H stable hgb 12.3 from 10.7, ok to resume Brillinta once NG out -IS/pulm toilet -path pending, follow  ID - none currently VTE - SCDs, ASA FEN - NPO, NG to LIWS Foley - none Follow up - Dr. [name]   LOS: 8 days    Jill Alexanders , [facility] Surgery 01/27/2021, 9:45 AM Please see Amion for pager number during day  hours 7:00am-4:30pm or 7:00am -11:30am on weekends

## 2021-01-27 NOTE — Progress Notes (Signed)
OT Cancellation Note  Patient Details Name: Dustin Golden MRN: [id number] DOB: 08-Jul-1956   Cancelled Treatment:    Reason Eval/Treat Not Completed: Medical issues which prohibited therapy. Hold therapy for now per RN. Patient has been up all night in pain and nauseous. Getting imaging to look for ileus and may be getting a NG tube. Will f/u as able  Nariya Neumeyer L Jazleen Robeck 01/27/2021, 3:32 PM

## 2021-01-28 DIAGNOSIS — C182 Malignant neoplasm of ascending colon: Secondary | ICD-10-CM | POA: Diagnosis not present

## 2021-01-28 DIAGNOSIS — N179 Acute kidney failure, unspecified: Secondary | ICD-10-CM

## 2021-01-28 DIAGNOSIS — C189 Malignant neoplasm of colon, unspecified: Secondary | ICD-10-CM

## 2021-01-28 DIAGNOSIS — K922 Gastrointestinal hemorrhage, unspecified: Secondary | ICD-10-CM | POA: Diagnosis not present

## 2021-01-28 DIAGNOSIS — I10 Essential (primary) hypertension: Secondary | ICD-10-CM | POA: Diagnosis not present

## 2021-01-28 DIAGNOSIS — K921 Melena: Secondary | ICD-10-CM | POA: Diagnosis not present

## 2021-01-28 HISTORY — DX: Malignant neoplasm of colon, unspecified: C18.9

## 2021-01-28 LAB — CBC
HCT: 30.4 % — ABNORMAL LOW (ref 39.0–52.0)
Hemoglobin: 10.1 g/dL — ABNORMAL LOW (ref 13.0–17.0)
MCH: 25.8 pg — ABNORMAL LOW (ref 26.0–34.0)
MCHC: 33.2 g/dL (ref 30.0–36.0)
MCV: 77.7 fL — ABNORMAL LOW (ref 80.0–100.0)
Platelets: 322 10*3/uL (ref 150–400)
RBC: 3.91 MIL/uL — ABNORMAL LOW (ref 4.22–5.81)
RDW: 13.5 % (ref 11.5–15.5)
WBC: 11.2 10*3/uL — ABNORMAL HIGH (ref 4.0–10.5)
nRBC: 0 % (ref 0.0–0.2)

## 2021-01-28 LAB — RENAL FUNCTION PANEL
Albumin: 3.5 g/dL (ref 3.5–5.0)
Anion gap: 10 (ref 5–15)
BUN: 32 mg/dL — ABNORMAL HIGH (ref 8–23)
CO2: 25 mmol/L (ref 22–32)
Calcium: 9.1 mg/dL (ref 8.9–10.3)
Chloride: 104 mmol/L (ref 98–111)
Creatinine, Ser: 1.68 mg/dL — ABNORMAL HIGH (ref 0.61–1.24)
GFR, Estimated: 45 mL/min — ABNORMAL LOW (ref >60)
Glucose, Bld: 148 mg/dL — ABNORMAL HIGH (ref 70–99)
Phosphorus: 4.9 mg/dL — ABNORMAL HIGH (ref 2.5–4.6)
Potassium: 3.8 mmol/L (ref 3.5–5.1)
Sodium: 139 mmol/L (ref 135–145)

## 2021-01-28 LAB — MAGNESIUM: Magnesium: 2.1 mg/dL (ref 1.7–2.4)

## 2021-01-28 MED ORDER — CHLORHEXIDINE GLUCONATE CLOTH 2 % EX PADS
6.0000 | MEDICATED_PAD | Freq: Every day | CUTANEOUS | Status: DC
  Administered 2021-01-28 – 2021-02-07 (×11): 6 via TOPICAL

## 2021-01-28 NOTE — Progress Notes (Signed)
3 Days Post-Op   Subjective/Chief Complaint: Complains of soreness. No flatus or bm yet   Objective: Vital signs in last 24 hours: Temp:  [98.1 F (36.7 C)-98.4 F (36.9 C)] 98.1 F (36.7 C) (04/15 0541) Pulse Rate:  [100-110] 110 (04/15 0541) Resp:  [16] 16 (04/15 0541) BP: (82-117)/(65-79) 117/79 (04/15 0541) SpO2:  [93 %-95 %] 93 % (04/15 0541) Last BM Date: 01/24/21  Intake/Output from previous day: 04/14 0701 - 04/15 0700 In: 1815.8 [I.V.:1291; IV Piggyback:524.8] Out: 1075 [Urine:725; Emesis/NG output:350] Intake/Output this shift: Total I/O In: 125 [I.V.:125] Out: 400 [Urine:400]  General appearance: alert and cooperative Resp: clear to auscultation bilaterally Cardio: regular rate and rhythm GI: distended and quiet. incision ok  Lab Results:  Recent Labs    01/27/21 0420 01/28/21 0409  WBC 13.5* 11.2*  HGB 12.3* 10.1*  HCT 36.4* 30.4*  PLT 399 322   BMET Recent Labs    01/26/21 0435 01/28/21 0409  NA 134* 139  K 4.2 3.8  CL 100 104  CO2 23 25  GLUCOSE 164* 148*  BUN 13 32*  CREATININE 1.21 1.68*  CALCIUM 9.0 9.1   PT/INR No results for input(s): LABPROT, INR in the last 72 hours. ABG No results for input(s): PHART, HCO3 in the last 72 hours.  Invalid input(s): PCO2, PO2  Studies/Results: DG Abd Portable 1V  Result Date: 01/27/2021 CLINICAL DATA:  Check gastric catheter placement EXAM: PORTABLE ABDOMEN - 1 VIEW COMPARISON:  Film from earlier in the same day. FINDINGS: Stable small bowel dilatation is noted. Gastric catheter is now noted within the stomach. No free air is seen. IMPRESSION: Gastric catheter within the stomach. Electronically Signed   By: Inez Catalina M.D.   On: 01/27/2021 13:43   DG Abd Portable 1V  Result Date: 01/27/2021 CLINICAL DATA:  Abdominal pain and rectal bleeding EXAM: PORTABLE ABDOMEN - 1 VIEW COMPARISON:  January 20, 2021 CT abdomen and pelvis FINDINGS: There are multiple loops of dilated small bowel without  appreciable air-fluid levels. No free air evident on supine examination. Lung bases are clear. There are multiple skin staples. IMPRESSION: Bowel dilatation, most likely representing postoperative ileus. A degree of bowel obstruction cannot be excluded in this circumstance. No free air. Lung bases clear. Electronically Signed   By: Lowella Grip III M.D.   On: 01/27/2021 09:45    Anti-infectives: Anti-infectives (From admission, onward)   Start     Dose/Rate Route Frequency Ordered Stop   01/25/21 0600  cefoTEtan (CEFOTAN) 2 g in sodium chloride 0.9 % 100 mL IVPB        2 g 200 mL/hr over 30 Minutes Intravenous On call to O.R. 01/24/21 1405 01/25/21 1110   01/24/21 0600  cefoTEtan (CEFOTAN) 2 g in sodium chloride 0.9 % 100 mL IVPB  Status:  Discontinued        2 g 200 mL/hr over 30 Minutes Intravenous On call to O.R. 01/22/21 0821 01/24/21 1406   01/23/21 1400  neomycin (MYCIFRADIN) tablet 1,000 mg       "And" Linked Group Details   1,000 mg Oral 3 times per day 01/22/21 0821 01/23/21 2233   01/23/21 1400  metroNIDAZOLE (FLAGYL) tablet 1,000 mg       "And" Linked Group Details   1,000 mg Oral 3 times per day 01/22/21 0821 01/23/21 2233      Assessment/Plan: s/p Procedure(s): LAPAROSCOPIC RIGHT COLECTOMY (N/A) Continue ng and bowel rest  Postop ileus persists Ambulate HTN Former heavy smoker - quit  11/2020 Alcohol dependence Polysubstance abuse - THC, cocaine (most recently 1 month ago) Anemia  Recentleft ICA and left MCA/M2 infarctions/p mechanical thrombectomy and left carotid stenting and angioplasty 12/13/20 on aspirin and brillinta (last dose 01/17/21 in AM)  - per neurology "it isideal to have some antiplatelet on board for at least 6 weeks to prevent stent thrombosis.But in his case, he requires a procedure urgently and emergently. He should be on a heparin drip-better than being on nothing and then antiplatelets resumed as soon as able postsurgically." -hgb stable, if  remains stable tomorrow can resume Brilinta at that time  POD 2, s/p lap converted to open R hemicolectomy forPartially obstructing tumor in the cecum, Dr. [name] 01/25/21 -post-op ileus clinically and on KUB, place NG tube to LIWS -needs to mobilize.  PT/OT/SLP consults placed - H&H stable hgb 12.3 from 10.7, ok to resume Brillinta once NG out -IS/pulm toilet -path pending, follow  ID -none currently VTE -SCDs, ASA FEN -NPO, NG to LIWS Foley -none Follow up -Dr. [name]  LOS: 9 days    Autumn Messing III 01/28/2021

## 2021-01-28 NOTE — Progress Notes (Signed)
Initial Nutrition Assessment  INTERVENTION:   -Recommend TPN as pt has not had adequate PO for 9 days d/t inability to have diet advanced  If diet advanced: -On Clears: Boost Breeze -Ensure Surgery po BID, each supplement provides 330 kcal and 18 grams of protein   NUTRITION DIAGNOSIS:   Increased nutrient needs related to post-op healing as evidenced by estimated needs.  GOAL:   Patient will meet greater than or equal to 90% of their needs  MONITOR:   Diet advancement,Labs,Weight trends,I & O's  REASON FOR ASSESSMENT:   NPO/Clear Liquid Diet (x 9 days)    ASSESSMENT:   [name] with PMH of recent left ACA and MCA CVA s/p left ICA thrombectomy followed by vascular stenting with mild residual upper and lower extremity weakness and dysarthria on DAPT with aspirin and Brilinta presenting with BRBPR.  EGD on 4/6 with mild erythematous mucosa in duodenum.  Colonoscopy on 4/7 with ulcerated cecal mass.  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  Pathology with high-grade dysplasia.  4/4: admitted 4/7: s/p colonoscopy: ulcerated cecal mass found 4/9: Dysphagia 1 diet 4/10: CLD 4/11: NPO 4/12: s/p dx lap, LOA, ex lap, rt hemicolectomy 4/14: NGT set to LIS  Patient has had inadequate PO for 9 days d/t inability to have diet advanced past clears d/t ileus and N/V. Now pt with NGT placed set to suction.   Recommend initiation of TPN to meet pt's nutritional needs which are increased d/t recent surgeries.  Per chart review, SLP to see pt for evaluation once able. Recommend Ensure supplements once diet is able to be advanced.  Pt reports his last meal was the Monday he was admitted and he tolerated this. States N/V didn't become an issue until after his surgery on 4/12.   Admission weight: 192 lbs. No other weights for admission. Suspect there has been a decrease.  I/Os: -2.9L since admit UOP: 725 ml x  24 hrs NGT: 350 ml  Medications: D5 infusion, IV Zofran  Labs reviewed: Elevated Phos (4.9)  NUTRITION - FOCUSED PHYSICAL EXAM:  No depletions noted  Diet Order:   Diet Order            Diet NPO time specified Except for: Ice Chips  Diet effective midnight                 EDUCATION NEEDS:   No education needs have been identified at this time  Skin:     Last BM:  4/11  Height:   Ht Readings from Last 1 Encounters:  01/20/21 6\' 3"  (1.905 m)    Weight:   Wt Readings from Last 1 Encounters:  01/20/21 87.2 kg   BMI:  Body mass index is 24.03 kg/m.  Estimated Nutritional Needs:   Kcal:  2200-2400  Protein:  110-125g  Fluid:  2.2L/day  Dustin Bibles, MS, RD, LDN Inpatient Clinical Dietitian Contact information available via Amion

## 2021-01-28 NOTE — Progress Notes (Addendum)
PROGRESS NOTE  Dustin Golden BMW:[id number] DOB: [birth year]   PCP: [name], PA  Patient is from: Home  DOA: 01/17/2021 LOS: 9  Chief complaints: Rectal bleed  Brief Narrative / Interim history: [name] with PMH of recent left ACA and MCA CVA s/p left ICA thrombectomy followed by vascular stenting with mild residual upper and lower extremity weakness and dysarthria on DAPT with aspirin and Brilinta presenting with BRBPR.  EGD on 4/6 with mild erythematous mucosa in duodenum.  Colonoscopy on 4/7 with ulcerated cecal mass.  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  Pathology with high-grade dysplasia.  Brilinta on hold.  Patient underwent laparotomy with right hemicolectomy on 01/25/2021.  Postop course complicated by ileus requiring NG tube placement.  Subjective: Seen and examined earlier this morning.  No major events overnight or this morning.  Feels sore in his abdomen but does not want to take pain medication out of concern for side effects.  He likes to walk so his ileus could resolve quicker.  He hears bowel sounds but no flatus or bowel movement yet.  He denies chest pain or dyspnea.  Objective: Vitals:   01/27/21 1254 01/27/21 2134 01/28/21 0541 01/28/21 1347  BP: (!) 82/65 105/73 117/79 120/76  Pulse: 100 (!) 109 (!) 110 (!) 109  Resp: 16 16 16 17   Temp: 98.4 F (36.9 C)  98.1 F (36.7 C) 98.4 F (36.9 C)  TempSrc: Axillary  Oral   SpO2: 95% 95% 93% 94%  Weight:      Height:        Intake/Output Summary (Last 24 hours) at 01/28/2021 1402 Last data filed at 01/28/2021 1346 Gross per 24 hour  Intake 1879.77 ml  Output 1225 ml  Net 654.77 ml   Filed Weights   01/17/21 1352 01/20/21 1135  Weight: 87.2 kg 87.2 kg    Examination:  GENERAL: No apparent distress.  Nontoxic. HEENT: MMM.  NGT in place. NECK: Supple.  No apparent JVD.  RESP:  No IWOB.  Fair aeration bilaterally. CVS:   RRR. Heart sounds normal.  ABD/GI/GU: BS++. Abd soft and soft to palpation.  Honeycomb dressing DCI. MSK/EXT:  Moves extremities. No apparent deformity. No edema.  SKIN: Honeycomb dressing over abdomen DCI. NEURO: Awake, alert and oriented appropriately.  Some dysarthria.  PSYCH: Calm. Normal affect.  Procedures:  4/6-EGD with erythematous mucosa and duodenum 4/7-colonoscopy with fungating mass in cecum.  Pathology with high-grade dysplasia. 4/12-laparotomy and right hemicolectomy.  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 Nausea/vomiting/hiccups-likely due to postop ileus.  Improved. -General surgery recommendations  -NG tube and antiemetics including Compazine  -Patient is ambulating in hallway.  Continue encouraging  -IS/pulm toilet  -Continue D5-NS at 125 cc an hour  -IV meds until he is able to take p.o.  Hematochezia: Likely due to the above.  H&H stable. Recent Labs    01/17/21 1444 01/18/21 0506 01/19/21 0415 01/22/21 0745 01/23/21 0448 01/24/21 0430 01/25/21 0351 01/26/21 0435 01/27/21 0420 01/28/21 0409  HGB 11.2* 10.8* 10.4* 10.8* 10.1* 10.5* 10.5* 10.7* 12.3* 10.1*  -Monitor H&H  Recent left ACA and MCA/M2 stroke s/p left ICA mechanical thrombectomy followed by rescue stenting with mid residual upper and lower extremity weakness and dysarthria: Stable. -Holding DAPT  in the setting of ileus.  Will restart once he is able to take p.o.  AKI/azotemia: Likely prerenal from poor p.o. intake after surgery. Recent Labs    12/15/20 0235 12/16/20 0411 12/20/20 0439 12/21/20 0545 12/27/20 0442 01/17/21 1444 01/18/21 0506 01/25/21 0351 01/26/21 0435 01/28/21 0409  BUN 10 8 14 13 10 13 12  7* 13 32*  CREATININE 1.08 1.04  1.31* 1.11 1.09 1.01 1.24 0.98 1.21 1.68*  -Continue IV fluid -Further work-up if no improvement -Avoid nephrotoxic meds  Essential hypertension: Hypotensive -Hold p.o. meds -IV fluid as above  Hyponatremia: Resolved. -IV fluid as above  Leukocytosis: Likely demargination.  Improved. -Continue monitoring  Hyperlipidemia: Stable -Restart statin once able to take p.o.   Body mass index is 24.03 kg/m. Nutrition Problem: Increased nutrient needs Etiology: post-op healing Signs/Symptoms: estimated needs Interventions: Refer to RD note for recommendations   DVT prophylaxis:  SCD's Start: 01/22/21 0818 SCDs Start: 01/17/21 2046 Patient is also on IV heparin. Code Status: Full code Family Communication: Patient and/or RN. Available if any question.  Level of care: Med-Surg Status is: Inpatient  Remains inpatient appropriate because:IV treatments appropriate due to intensity of illness or inability to take PO and Inpatient level of care appropriate due to severity of illness   Dispo: The patient is from: Home              Anticipated d/c is to: Home              Patient currently is not medically stable to d/c.   Difficult to place patient No       Consultants:  Gastroenterology-signed off Neurology over the phone General surgery   Sch Meds:  Scheduled Meds: . bupivacaine liposome  20 mL Infiltration Once  . Chlorhexidine Gluconate Cloth  6 each Topical Daily  . feeding supplement  1 Container Oral TID WC  . lidocaine  1 patch Transdermal Q24H  . lip balm  1 application Topical BID  . pantoprazole (PROTONIX) IV  40 mg Intravenous Q24H  . simethicone  80 mg Oral QID   Continuous Infusions: . chlorproMAZINE (THORAZINE) IV 25 mg (01/27/21 6237)  . dextrose 5 % and 0.9% NaCl 125 mL/hr at 01/28/21 0327  . methocarbamol (ROBAXIN) IV 500 mg (01/28/21 0619)   PRN Meds:.alum & mag hydroxide-simeth, chlorproMAZINE (THORAZINE) IV, labetalol, magic mouthwash,  menthol-cetylpyridinium, morphine injection, ondansetron **OR** ondansetron (ZOFRAN) IV, phenol  Antimicrobials: Anti-infectives (From admission, onward)   Start     Dose/Rate Route Frequency Ordered Stop   01/25/21 0600  cefoTEtan (CEFOTAN) 2 g in sodium chloride 0.9 % 100 mL IVPB        2 g 200 mL/hr over 30 Minutes Intravenous On call to O.R. 01/24/21 1405 01/25/21 1110   01/24/21 0600  cefoTEtan (CEFOTAN) 2 g in sodium chloride 0.9 % 100 mL IVPB  Status:  Discontinued        2 g 200 mL/hr over 30 Minutes Intravenous On call to O.R. 01/22/21 0821 01/24/21 1406   01/23/21 1400  neomycin (MYCIFRADIN) tablet 1,000 mg       "And" Linked Group Details   1,000 mg Oral 3 times per day 01/22/21 0821 01/23/21 2233   01/23/21 1400  metroNIDAZOLE (FLAGYL) tablet 1,000 mg       "And" Linked Group Details   1,000 mg Oral 3 times per day 01/22/21 6283 01/23/21 2233       I have personally reviewed the following labs and images: CBC: Recent Labs  Lab 01/22/21 0745 01/23/21 0448 01/24/21 0430 01/25/21 0351 01/26/21 0435 01/27/21 0420 01/28/21 0409  WBC 5.8 5.5  --   --  9.9 13.5* 11.2*  HGB 10.8* 10.1* 10.5* 10.5* 10.7* 12.3* 10.1*  HCT 32.6* 31.1* 31.6* 31.9* 31.9* 36.4* 30.4*  MCV 80.3 80.2  --   --  78.2* 77.9* 77.7*  PLT 257 252  --   --  306 399 322   BMP &GFR Recent Labs  Lab 01/25/21 0351 01/26/21 0435 01/28/21 0409  NA 133* 134* 139  K 4.1 4.2 3.8  CL 100 100 104  CO2 25 23 25   GLUCOSE 101* 164* 148*  BUN 7* 13 32*  CREATININE 0.98 1.21 1.68*  CALCIUM 9.3 9.0 9.1  MG 2.0 1.9 2.1  PHOS  --  4.7* 4.9*   Estimated Creatinine Clearance: 53.1 mL/min (A) (by C-G formula based on SCr of 1.68 mg/dL (H)). Liver & Pancreas: Recent Labs  Lab 01/25/21 0351 01/26/21 0435 01/28/21 0409  AST 19  --   --   ALT 16  --   --   ALKPHOS 107  --   --   BILITOT 0.6  --   --   PROT 7.7  --   --   ALBUMIN 4.2 3.8 3.5   No results for input(s): LIPASE, AMYLASE in the last 168  hours. No results for input(s): AMMONIA in the last 168 hours. Diabetic: No results for input(s): HGBA1C in the last 72 hours. No results for input(s): GLUCAP in the last 168 hours. Cardiac Enzymes: No results for input(s): CKTOTAL, CKMB, CKMBINDEX, TROPONINI in the last 168 hours. No results for input(s): PROBNP in the last 8760 hours. Coagulation Profile: No results for input(s): INR, PROTIME in the last 168 hours. Thyroid Function Tests: No results for input(s): TSH, T4TOTAL, FREET4, T3FREE, THYROIDAB in the last 72 hours. Lipid Profile: No results for input(s): CHOL, HDL, LDLCALC, TRIG, CHOLHDL, LDLDIRECT in the last 72 hours. Anemia Panel: No results for input(s): VITAMINB12, FOLATE, FERRITIN, TIBC, IRON, RETICCTPCT in the last 72 hours. Urine analysis:    Component Value Date/Time   COLORURINE STRAW (A) 12/13/2020 1245   APPEARANCEUR CLEAR 12/13/2020 1245   LABSPEC 1.019 12/13/2020 1245   PHURINE 7.0 12/13/2020 1245   GLUCOSEU NEGATIVE 12/13/2020 1245   HGBUR NEGATIVE 12/13/2020 1245   [place] 12/13/2020 1245   [place] 12/13/2020 1245   PROTEINUR NEGATIVE 12/13/2020 1245   NITRITE NEGATIVE 12/13/2020 1245   LEUKOCYTESUR NEGATIVE 12/13/2020 1245   Sepsis Labs: Invalid input(s): PROCALCITONIN, [place]  Microbiology: Recent Results (from the past 240 hour(s))  Surgical pcr screen     Status: None   Collection Time: 01/24/21  2:56 AM   Specimen: Nasal Mucosa; Nasal Swab  Result Value Ref Range Status   MRSA, PCR NEGATIVE NEGATIVE Final   Staphylococcus aureus NEGATIVE NEGATIVE Final    Comment: (NOTE) The Xpert SA Assay (FDA approved for NASAL specimens in patients 33 years of age and older), is one component of a comprehensive surveillance program. It is not intended to diagnose infection nor to guide or monitor treatment. Performed at [facility], [place] [address] [place], [place] [address]     Radiology  Studies: No results found.    Tora Prunty T. Canavanas  If 7PM-7AM, please contact night-coverage [email or web address] 01/28/2021, 2:02 PM

## 2021-01-28 NOTE — Assessment & Plan Note (Signed)
Stage II

## 2021-01-28 NOTE — Evaluation (Signed)
Occupational Therapy Evaluation Patient Details Name: Dustin Golden MRN: [id number] DOB: April 19, 1956 Today's Date: 01/28/2021    History of Present Illness Pt admitted with melena and anemia.  Colonoscopy on 4/7 with ulcerated cecal mass.  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  Pathology with high-grade dysplasia.  General surgery consulted   s/p lap converted to open R hemicolectomy for Partially obstructing tumor in the cecum per  Dr. [name] on  01/25/21. PMHx: recent CVA with residual mild R side weakness, mild right side inattention/awarenss and dysarthria   Clinical Impression   [name] is a 65 year old man who presents with left sided deficits (sensation, decreased fine motor control, dysarthria) from recent stroke, decreased activity tolerance and abdominal pain. On evaluation patient able to perform bed mobility and limited in room ambulation with RW without physical assistance but min guard for safety and management of lines. Patient exhibits normal ROM and strength of LUE, very mild difference in strength in RUE. Patient's most significant deficit is with right hand motor control and decreased sensation in right and left leg. Patient able to perform ADL tasks with increased time and setup - limited by NG tube, IV line, catheter and abdominal pain. Patient will benefit from skilled OT services while in hospital to improve deficits and learn compensatory strategies as needed in order to return to PLOF.  Patient wants to resume Outpatient therapy services.    Follow Up Recommendations  Outpatient OT (Resume)    Equipment Recommendations  None recommended by OT    Recommendations for Other Services       Precautions / Restrictions Precautions Precaution Comments: NG tube Restrictions Weight Bearing Restrictions: No      Mobility Bed Mobility Overal bed mobility: Needs Assistance Bed Mobility: Supine  to Sit     Supine to sit: Supervision     General bed mobility comments: assistance for lines.    Transfers Overall transfer level: Needs assistance Equipment used: Rolling walker (2 wheeled) Transfers: Sit to/from Stand Sit to Stand: Min guard         General transfer comment: Min guard to transfer to recliner and ambulate forward and back with RW - no obvious balance deficits.    Balance Overall balance assessment: No apparent balance deficits (not formally assessed)                                         ADL either performed or assessed with clinical judgement   ADL Overall ADL's : Modified independent;Needs assistance/impaired Eating/Feeding: NPO   Grooming: Supervision/safety;Set up;Sitting   Upper Body Bathing: Set up;Sitting;Supervision/ safety   Lower Body Bathing: Supervison/ safety;Sit to/from stand;Set up   Upper Body Dressing : Set up;Sitting   Lower Body Dressing: Set up;Sit to/from stand Lower Body Dressing Details (indicate cue type and reason): able to don socks with increased amount of time due to difficulty with right hand. Toilet Transfer: Product/process development scientist and Hygiene: Min guard;Supervision/safety               Vision Patient Visual Report: No change from baseline       Perception     Praxis      Pertinent Vitals/Pain Pain Assessment: 0-10 Pain Score: 7  Pain Location: abd Pain Descriptors / Indicators: Discomfort;Grimacing Pain  Intervention(s): Monitored during session     Hand Dominance Right   Extremity/Trunk Assessment Upper Extremity Assessment Upper Extremity Assessment: RUE deficits/detail;LUE deficits/detail RUE Deficits / Details: WNL ROM, 5/5 shoulder strength, 4+/5 elbow strength, 5/5 wrist strength RUE Sensation: decreased light touch (tingling) RUE Coordination: decreased fine motor (functonal gross motor control but bradykinetic. Decreased motor  control of fingers - difficulty with finger to thumb and in hand manipulation. Decreased sensation.) LUE Deficits / Details: WNl ROM 5/5 strength LUE Sensation: WNL LUE Coordination: WNL   Lower Extremity Assessment Lower Extremity Assessment: Defer to PT evaluation   Cervical / Trunk Assessment Cervical / Trunk Assessment: Normal   Communication Communication Communication: Expressive difficulties;Other (comment) (dysarthria)   Cognition Arousal/Alertness: Awake/alert Behavior During Therapy: WFL for tasks assessed/performed Overall Cognitive Status: Within Functional Limits for tasks assessed                                     General Comments       Exercises     Shoulder Instructions      Home Living Family/patient expects to be discharged to:: Private residence Living Arrangements: Spouse/significant other Available Help at Discharge: Family;Available 24 hours/day Type of Home: House Home Access: Stairs to enter [employer] of Steps: 1   Home Layout: One level;Other (Comment);Laundry or work area in basement     [employer] Shower/Tub: Corporate investment banker: Programmer, systems: Yes How Accessible: Accessible via walker Home Equipment: Kasandra Knudsen - single point;Crutches      Lives With: Significant other    Prior Functioning/Environment Level of Independence: Independent        Comments: States walking 2 miles a day prior to this admit        OT Problem List: Decreased activity tolerance;Decreased knowledge of use of DME or AE;Pain      OT Treatment/Interventions: Self-care/ADL training;DME and/or AE instruction;Patient/family education;Therapeutic activities    OT Goals(Current goals can be found in the care plan section) Acute Rehab OT Goals Patient Stated Goal: walk more OT Goal Formulation: With patient Time For Goal Achievement: 02/11/21 Potential to Achieve Goals: Good  OT Frequency: Min  1X/week   Barriers to D/C:            Co-evaluation              AM-PAC OT "6 Clicks" Daily Activity     Outcome Measure Help from another person eating meals?: Total (NPO) Help from another person taking care of personal grooming?: A Little Help from another person toileting, which includes using toliet, bedpan, or urinal?: A Little Help from another person bathing (including washing, rinsing, drying)?: A Little Help from another person to put on and taking off regular upper body clothing?: A Little Help from another person to put on and taking off regular lower body clothing?: A Little 6 Click Score: 16   End of Session Equipment Utilized During Treatment: Rolling walker Nurse Communication:  (okay to see per RN)  Activity Tolerance: Patient limited by pain Patient left: in chair;with call bell/phone within reach  OT Visit Diagnosis: Pain;Hemiplegia and hemiparesis Hemiplegia - Right/Left: Right Hemiplegia - dominant/non-dominant: Dominant Hemiplegia - caused by: Cerebral infarction                Time: 3532-9924 OT Time Calculation (min): 18 min Charges:  OT General Charges $OT Visit: 1 Visit OT Evaluation $OT Eval Moderate Complexity:  [place], OTR/L [place]  Office [phone] Pager: [place] 01/28/2021, 9:52 AM

## 2021-01-28 NOTE — Progress Notes (Signed)
Patient stated he was not going to take anymore muscle relaxers, Tylenol or morphine. Patient said they make him weak and he feels they are contributing to his ileus and having to keep the NG tube.

## 2021-01-28 NOTE — Progress Notes (Signed)
Physical Therapy Treatment Patient Details Name: Dustin Golden MRN: [id number] DOB: 1956/09/09 Today's Date: 01/28/2021    History of Present Illness Pt admitted with melena and anemia.  Colonoscopy on 4/7 with ulcerated cecal mass.  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  Pathology with high-grade dysplasia.  General surgery consulted   s/p lap converted to open R hemicolectomy for Partially obstructing tumor in the cecum per  Dr. [name] on  01/25/21. 01/28/21-->Pt now with post op ileus PMHx: recent CVA with residual mild R side weakness, mild right side inattention/awarenss and dysarthria    PT Comments    Pt progressing well today. incr activity tolerance compared to last session. Will follow for a few more sessions to progress gait/balance. Do not feel pt will need post acute f/u.  Follow Up Recommendations  No PT follow up     Equipment Recommendations  None recommended by PT    Recommendations for Other Services       Precautions / Restrictions Precautions Precautions: Fall Precaution Comments: NG tube Restrictions Weight Bearing Restrictions: No    Mobility  Bed Mobility Overal bed mobility: Needs Assistance Bed Mobility: Sit to Sidelying     Supine to sit: Supervision   Sit to sidelying: Min guard General bed mobility comments: incr time to elevate LEs, min/guard for lines and safety    Transfers Overall transfer level: Needs assistance Equipment used: None Transfers: Sit to/from Stand Sit to Stand: Supervision;Min guard         General transfer comment: for safety and line management  Ambulation/Gait Ambulation/Gait assistance: Supervision;Min guard Gait Distance (Feet): 600 Feet Assistive device: None Gait Pattern/deviations: Decreased stance time - right;Wide base of support;Drifts right/left     General Gait Details: min/guard to supervision for safety. incr stance time on  LLE d/t delayed initiation of swing phase on R. no overt LOB   Stairs             Wheelchair Mobility    Modified Rankin (Stroke Patients Only)       Balance Overall balance assessment: Needs assistance                           High level balance activites: Side stepping;Direction changes;Head turns High Level Balance Comments: min/guard for safety. no overt LOB            Cognition Arousal/Alertness: Awake/alert Behavior During Therapy: WFL for tasks assessed/performed Overall Cognitive Status: Within Functional Limits for tasks assessed                                        Exercises      General Comments        Pertinent Vitals/Pain Pain Assessment: 0-10 Pain Score: 5  Pain Location: abd Pain Descriptors / Indicators: Discomfort;Grimacing Pain Intervention(s): Limited activity within patient's tolerance;Monitored during session;Repositioned    Home Living Family/patient expects to be discharged to:: Private residence Living Arrangements: Spouse/significant other Available Help at Discharge: Family;Available 24 hours/day Type of Home: House Home Access: Stairs to enter   Home Layout: One level;Other (Comment);Laundry or work area in [place]: Kasandra Knudsen - single point;Crutches      Prior Function Level of Independence: Independent      Comments: States walking 2 miles a day prior  to this admit   PT Goals (current goals can now be found in the care plan section) Acute Rehab PT Goals Patient Stated Goal: walk more PT Goal Formulation: With patient Time For Goal Achievement: 02/09/21 Potential to Achieve Goals: Good Progress towards PT goals: Progressing toward goals    Frequency    Min 2X/week      PT Plan Current plan remains appropriate    Co-evaluation              AM-PAC PT "6 Clicks" Mobility   Outcome Measure  Help needed turning from your back to your side while in a flat bed  without using bedrails?: A Little Help needed moving from lying on your back to sitting on the side of a flat bed without using bedrails?: A Little Help needed moving to and from a bed to a chair (including a wheelchair)?: A Little Help needed standing up from a chair using your arms (e.g., wheelchair or bedside chair)?: A Little Help needed to walk in hospital room?: A Little Help needed climbing 3-5 steps with a railing? : A Little 6 Click Score: 18    End of Session Equipment Utilized During Treatment: Gait belt Activity Tolerance: Patient tolerated treatment well Patient left: in bed;with call bell/phone within reach;with bed alarm set   PT Visit Diagnosis: Unsteadiness on feet (R26.81)     Time: 1000-1017 PT Time Calculation (min) (ACUTE ONLY): 17 min  Charges:  $Gait Training: 8-22 mins                     Baxter Flattery, PT  Acute Rehab Dept ([place]) [phone] Pager [phone]  01/28/2021    [facility] 01/28/2021, 10:56 AM

## 2021-01-29 DIAGNOSIS — K635 Polyp of colon: Secondary | ICD-10-CM

## 2021-01-29 DIAGNOSIS — K922 Gastrointestinal hemorrhage, unspecified: Secondary | ICD-10-CM | POA: Diagnosis not present

## 2021-01-29 DIAGNOSIS — I10 Essential (primary) hypertension: Secondary | ICD-10-CM | POA: Diagnosis not present

## 2021-01-29 DIAGNOSIS — C189 Malignant neoplasm of colon, unspecified: Secondary | ICD-10-CM | POA: Diagnosis not present

## 2021-01-29 DIAGNOSIS — E785 Hyperlipidemia, unspecified: Secondary | ICD-10-CM | POA: Diagnosis not present

## 2021-01-29 LAB — COMPREHENSIVE METABOLIC PANEL
ALT: 14 U/L (ref 0–44)
AST: 17 U/L (ref 15–41)
Albumin: 3.5 g/dL (ref 3.5–5.0)
Alkaline Phosphatase: 72 U/L (ref 38–126)
Anion gap: 9 (ref 5–15)
BUN: 14 mg/dL (ref 8–23)
CO2: 25 mmol/L (ref 22–32)
Calcium: 8.8 mg/dL — ABNORMAL LOW (ref 8.9–10.3)
Chloride: 109 mmol/L (ref 98–111)
Creatinine, Ser: 1.08 mg/dL (ref 0.61–1.24)
GFR, Estimated: >60 mL/min (ref >60)
Glucose, Bld: 128 mg/dL — ABNORMAL HIGH (ref 70–99)
Potassium: 3.7 mmol/L (ref 3.5–5.1)
Sodium: 143 mmol/L (ref 135–145)
Total Bilirubin: 0.4 mg/dL (ref 0.3–1.2)
Total Protein: 7 g/dL (ref 6.5–8.1)

## 2021-01-29 LAB — CBC
HCT: 29.9 % — ABNORMAL LOW (ref 39.0–52.0)
Hemoglobin: 9.6 g/dL — ABNORMAL LOW (ref 13.0–17.0)
MCH: 25.9 pg — ABNORMAL LOW (ref 26.0–34.0)
MCHC: 32.1 g/dL (ref 30.0–36.0)
MCV: 80.6 fL (ref 80.0–100.0)
Platelets: 342 10*3/uL (ref 150–400)
RBC: 3.71 MIL/uL — ABNORMAL LOW (ref 4.22–5.81)
RDW: 13.7 % (ref 11.5–15.5)
WBC: 9.4 10*3/uL (ref 4.0–10.5)
nRBC: 0 % (ref 0.0–0.2)

## 2021-01-29 LAB — PHOSPHORUS: Phosphorus: 3.4 mg/dL (ref 2.5–4.6)

## 2021-01-29 LAB — MAGNESIUM: Magnesium: 2.3 mg/dL (ref 1.7–2.4)

## 2021-01-29 MED ORDER — ASPIRIN EC 81 MG PO TBEC
81.0000 mg | DELAYED_RELEASE_TABLET | Freq: Every day | ORAL | Status: DC
  Administered 2021-01-29 – 2021-01-31 (×2): 81 mg via ORAL
  Filled 2021-01-29 (×3): qty 1

## 2021-01-29 MED ORDER — DIPHENHYDRAMINE HCL 50 MG/ML IJ SOLN
12.5000 mg | Freq: Once | INTRAMUSCULAR | Status: AC
  Administered 2021-01-29: 12.5 mg via INTRAVENOUS
  Filled 2021-01-29: qty 1

## 2021-01-29 MED ORDER — ATORVASTATIN CALCIUM 20 MG PO TABS
20.0000 mg | ORAL_TABLET | Freq: Every day | ORAL | Status: DC
  Administered 2021-01-29 – 2021-01-31 (×2): 20 mg via ORAL
  Filled 2021-01-29 (×3): qty 1

## 2021-01-29 NOTE — Progress Notes (Signed)
4 Days Post-Op   Subjective/Chief Complaint: Having Bm's   Objective: Vital signs in last 24 hours: Temp:  [98.4 F (36.9 C)-98.9 F (37.2 C)] 98.9 F (37.2 C) (04/16 0651) Pulse Rate:  [100-109] 100 (04/16 0651) Resp:  [17-20] 18 (04/16 0651) BP: (120-134)/(76-87) 134/87 (04/16 0651) SpO2:  [91 %-94 %] 91 % (04/16 0651) Last BM Date: 01/28/21  Intake/Output from previous day: 04/15 0701 - 04/16 0700 In: 802 [P.O.:177; I.V.:625] Out: 2951 [Urine:2700; Emesis/NG output:250; Stool:1] Intake/Output this shift: No intake/output data recorded.  General appearance: alert and cooperative Resp: nonlabored GI: soft. incision ok NG output clear Lab Results:  Recent Labs    01/28/21 0409 01/29/21 0420  WBC 11.2* 9.4  HGB 10.1* 9.6*  HCT 30.4* 29.9*  PLT 322 342   BMET Recent Labs    01/28/21 0409 01/29/21 0420  NA 139 143  K 3.8 3.7  CL 104 109  CO2 25 25  GLUCOSE 148* 128*  BUN 32* 14  CREATININE 1.68* 1.08  CALCIUM 9.1 8.8*   PT/INR No results for input(s): LABPROT, INR in the last 72 hours. ABG No results for input(s): PHART, HCO3 in the last 72 hours.  Invalid input(s): PCO2, PO2  Studies/Results: DG Abd Portable 1V  Result Date: 01/27/2021 CLINICAL DATA:  Check gastric catheter placement EXAM: PORTABLE ABDOMEN - 1 VIEW COMPARISON:  Film from earlier in the same day. FINDINGS: Stable small bowel dilatation is noted. Gastric catheter is now noted within the stomach. No free air is seen. IMPRESSION: Gastric catheter within the stomach. Electronically Signed   By: Inez Catalina M.D.   On: 01/27/2021 13:43   DG Abd Portable 1V  Result Date: 01/27/2021 CLINICAL DATA:  Abdominal pain and rectal bleeding EXAM: PORTABLE ABDOMEN - 1 VIEW COMPARISON:  January 20, 2021 CT abdomen and pelvis FINDINGS: There are multiple loops of dilated small bowel without appreciable air-fluid levels. No free air evident on supine examination. Lung bases are clear. There are multiple skin  staples. IMPRESSION: Bowel dilatation, most likely representing postoperative ileus. A degree of bowel obstruction cannot be excluded in this circumstance. No free air. Lung bases clear. Electronically Signed   By: Lowella Grip III M.D.   On: 01/27/2021 09:45    Anti-infectives: Anti-infectives (From admission, onward)   Start     Dose/Rate Route Frequency Ordered Stop   01/25/21 0600  cefoTEtan (CEFOTAN) 2 g in sodium chloride 0.9 % 100 mL IVPB        2 g 200 mL/hr over 30 Minutes Intravenous On call to O.R. 01/24/21 1405 01/25/21 1110   01/24/21 0600  cefoTEtan (CEFOTAN) 2 g in sodium chloride 0.9 % 100 mL IVPB  Status:  Discontinued        2 g 200 mL/hr over 30 Minutes Intravenous On call to O.R. 01/22/21 0821 01/24/21 1406   01/23/21 1400  neomycin (MYCIFRADIN) tablet 1,000 mg       "And" Linked Group Details   1,000 mg Oral 3 times per day 01/22/21 0821 01/23/21 2233   01/23/21 1400  metroNIDAZOLE (FLAGYL) tablet 1,000 mg       "And" Linked Group Details   1,000 mg Oral 3 times per day 01/22/21 0821 01/23/21 2233      Assessment/Plan: s/p Procedure(s): LAPAROSCOPIC RIGHT COLECTOMY (N/A) Continue ng and bowel rest  Postop ileus persists Ambulate HTN Former heavy smoker - quit 11/2020 Alcohol dependence Polysubstance abuse - THC, cocaine (most recently 1 month ago) Anemia  Recentleft ICA and  left MCA/M2 infarctions/p mechanical thrombectomy and left carotid stenting and angioplasty 12/13/20 on aspirin and brillinta (last dose 01/17/21 in AM)  - per neurology "it isideal to have some antiplatelet on board for at least 6 weeks to prevent stent thrombosis.But in his case, he requires a procedure urgently and emergently. He should be on a heparin drip-better than being on nothing and then antiplatelets resumed as soon as able postsurgically." -hgb stable, if remains stable tomorrow can resume Brilinta at that time  POD 3, s/p lap converted to open R hemicolectomy  forPartially obstructing tumor in the cecum, Dr. [name] 01/25/21 -post-op ileus clinically improved, d/c NG tube  -needs to mobilize.  PT/OT/SLP consults placed - H&H stable, ok to resume Brillinta  -IS/pulm toilet -path pending, follow  ID -none currently VTE -SCDs, ASA FEN -clears, advance as tolerated Foley -none Follow up -Dr. [name]  LOS: 10 days    Dustin Golden 03/03/8415

## 2021-01-29 NOTE — Progress Notes (Signed)
PROGRESS NOTE  Dustin Golden KDX:[id number] DOB: 10-19-1955   PCP: [name], PA  Patient is from: Home  DOA: 01/17/2021 LOS: 58  Chief complaints: Rectal bleed  Brief Narrative / Interim history: [name] with PMH of recent left ACA and MCA CVA s/p left ICA thrombectomy followed by vascular stenting with mild residual upper and lower extremity weakness and dysarthria on DAPT with aspirin and Brilinta presenting with BRBPR.  EGD on 4/6 with mild erythematous mucosa in duodenum.  Colonoscopy on 4/7 with ulcerated cecal mass.  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  Pathology with high-grade dysplasia.  Brilinta on hold.  Patient underwent laparotomy with right hemicolectomy on 01/25/2021.  Postop course complicated by ileus requiring NGT.  NG tube removed on 4/16.  Subjective: Seen and examined earlier this morning.  No major events overnight of this morning.  Reports having 2 bowel movements.  He wants to have the NG tube removed.  No other complaints.  Objective: Vitals:   01/28/21 0541 01/28/21 1347 01/28/21 2110 01/29/21 0651  BP: 117/79 120/76 132/79 134/87  Pulse: (!) 110 (!) 109 (!) 109 100  Resp: 16 17 20 18   Temp: 98.1 F (36.7 C) 98.4 F (36.9 C) 98.4 F (36.9 C) 98.9 F (37.2 C)  TempSrc: Oral  Oral Oral  SpO2: 93% 94% 93% 91%  Weight:      Height:        Intake/Output Summary (Last 24 hours) at 01/29/2021 1056 Last data filed at 01/29/2021 0900 Gross per 24 hour  Intake 677 ml  Output 2752 ml  Net -2075 ml   Filed Weights   01/17/21 1352 01/20/21 1135  Weight: 87.2 kg 87.2 kg    Examination:  GENERAL: No apparent distress.  Nontoxic. HEENT: MMM.  Vision and hearing grossly intact.  NECK: Supple.  No apparent JVD.  RESP:  No IWOB.  Fair aeration bilaterally. CVS:  RRR. Heart sounds normal.  ABD/GI/GU: BS+. Abd soft.  Some tenderness.Marland Kitchen  Honeycomb dressing DCI. MSK/EXT:   Moves extremities. No apparent deformity. No edema.  SKIN: no apparent skin lesion or wound NEURO: Awake, alert and oriented appropriately.  Some dysarthria. PSYCH: Somewhat upset about delay in NG tube removal  Procedures:  4/6-EGD with erythematous mucosa and duodenum 4/7-colonoscopy with fungating mass in cecum.  Pathology with high-grade dysplasia. 4/12-laparotomy and right hemicolectomy.  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  Had bowel movements. Nausea/vomiting/hiccups-likely due to postop ileus.  Improved. -General surgery recommendations  -NG tube removed.  -Mobilize  -IS/pulm toilet  -Clear liquid diet -Continue IV fluid for today  Hematochezia: Likely due to the above.  H&H stable. Recent Labs    01/18/21 0506 01/19/21 0415 01/22/21 0745 01/23/21 0448 01/24/21 0430 01/25/21 0351 01/26/21 0435 01/27/21 0420 01/28/21 0409 01/29/21 0420  HGB 10.8* 10.4* 10.8* 10.1* 10.5* 10.5* 10.7* 12.3* 10.1* 9.6*  -Monitor H&H  Recent left ACA and MCA/M2 stroke s/p left ICA mechanical thrombectomy followed by rescue stenting with mid residual upper and lower extremity weakness and dysarthria: Stable. -Restart aspirin and Lipitor -Hopefully we can resume his Brilinta in the morning.  AKI/azotemia: Likely prerenal from poor p.o. intake after surgery.  Resolved. Recent Labs    12/16/20 0411 12/20/20 0439 12/21/20 0545 12/27/20  5462 01/17/21 1444 01/18/21 0506 01/25/21 0351 01/26/21 0435 01/28/21 0409 01/29/21 0420  BUN 8 14 13 10 13 12  7* 13 32* 14  CREATININE 1.04 1.31* 1.11 1.09 1.01 1.24 0.98 1.21 1.68* 1.08  -Continue IV fluid for today -Avoid nephrotoxic meds  Essential hypertension: Normotensive -Restart home meds in the  morning  Hyponatremia: Resolved. -IV fluid as above  Leukocytosis: Likely demargination.  Resolved. -Continue monitoring  Hyperlipidemia: Stable -Restart Lipitor.   Body mass index is 24.03 kg/m. Nutrition Problem: Increased nutrient needs Etiology: post-op healing Signs/Symptoms: estimated needs Interventions: Refer to RD note for recommendations   DVT prophylaxis:  SCD's Start: 01/22/21 0818 SCDs Start: 01/17/21 2046 Patient is also on IV heparin. Code Status: Full code Family Communication: Patient and/or RN. Available if any question.  Level of care: Med-Surg Status is: Inpatient  Remains inpatient appropriate because:IV treatments appropriate due to intensity of illness or inability to take PO and Inpatient level of care appropriate due to severity of illness   Dispo: The patient is from: Home              Anticipated d/c is to: Home              Patient currently is not medically stable to d/c.   Difficult to place patient No       Consultants:  Gastroenterology-signed off Neurology over the phone General surgery   Sch Meds:  Scheduled Meds: . bupivacaine liposome  20 mL Infiltration Once  . Chlorhexidine Gluconate Cloth  6 each Topical Daily  . feeding supplement  1 Container Oral TID WC  . lidocaine  1 patch Transdermal Q24H  . lip balm  1 application Topical BID  . pantoprazole (PROTONIX) IV  40 mg Intravenous Q24H  . simethicone  80 mg Oral QID   Continuous Infusions: . chlorproMAZINE (THORAZINE) IV 25 mg (01/27/21 7035)  . dextrose 5 % and 0.9% NaCl 125 mL/hr at 01/28/21 0327  . methocarbamol (ROBAXIN) IV 500 mg (01/29/21 0540)   PRN Meds:.alum & mag hydroxide-simeth, chlorproMAZINE (THORAZINE) IV, labetalol, magic mouthwash, menthol-cetylpyridinium, morphine injection, ondansetron **OR** ondansetron (ZOFRAN) IV, phenol  Antimicrobials: Anti-infectives (From admission, onward)   Start     Dose/Rate Route Frequency Ordered Stop   01/25/21  0600  cefoTEtan (CEFOTAN) 2 g in sodium chloride 0.9 % 100 mL IVPB        2 g 200 mL/hr over 30 Minutes Intravenous On call to O.R. 01/24/21 1405 01/25/21 1110   01/24/21 0600  cefoTEtan (CEFOTAN) 2 g in sodium chloride 0.9 % 100 mL IVPB  Status:  Discontinued        2 g 200 mL/hr over 30 Minutes Intravenous On call to O.R. 01/22/21 0821 01/24/21 1406   01/23/21 1400  neomycin (MYCIFRADIN) tablet 1,000 mg       "And" Linked Group Details   1,000 mg Oral 3 times per day 01/22/21 0821 01/23/21 2233   01/23/21 1400  metroNIDAZOLE (FLAGYL) tablet 1,000 mg       "And" Linked Group Details   1,000 mg Oral 3 times per day 01/22/21 0821 01/23/21 2233       I have personally reviewed the following labs and images: CBC: Recent Labs  Lab 01/23/21 0448 01/24/21 0430 01/25/21 0351 01/26/21 0435 01/27/21 0420 01/28/21 0409 01/29/21 0420  WBC 5.5  --   --  9.9 13.5* 11.2* 9.4  HGB 10.1*   < > 10.5* 10.7* 12.3* 10.1* 9.6*  HCT 31.1*   < >  31.9* 31.9* 36.4* 30.4* 29.9*  MCV 80.2  --   --  78.2* 77.9* 77.7* 80.6  PLT 252  --   --  306 399 322 342   < > = values in this interval not displayed.   BMP &GFR Recent Labs  Lab 01/25/21 0351 01/26/21 0435 01/28/21 0409 01/29/21 0420  NA 133* 134* 139 143  K 4.1 4.2 3.8 3.7  CL 100 100 104 109  CO2 25 23 25 25   GLUCOSE 101* 164* 148* 128*  BUN 7* 13 32* 14  CREATININE 0.98 1.21 1.68* 1.08  CALCIUM 9.3 9.0 9.1 8.8*  MG 2.0 1.9 2.1 2.3  PHOS  --  4.7* 4.9* 3.4   Estimated Creatinine Clearance: 82.6 mL/min (by C-G formula based on SCr of 1.08 mg/dL). Liver & Pancreas: Recent Labs  Lab 01/25/21 0351 01/26/21 0435 01/28/21 0409 01/29/21 0420  AST 19  --   --  17  ALT 16  --   --  14  ALKPHOS 107  --   --  72  BILITOT 0.6  --   --  0.4  PROT 7.7  --   --  7.0  ALBUMIN 4.2 3.8 3.5 3.5   No results for input(s): LIPASE, AMYLASE in the last 168 hours. No results for input(s): AMMONIA in the last 168 hours. Diabetic: No results for  input(s): HGBA1C in the last 72 hours. No results for input(s): GLUCAP in the last 168 hours. Cardiac Enzymes: No results for input(s): CKTOTAL, CKMB, CKMBINDEX, TROPONINI in the last 168 hours. No results for input(s): PROBNP in the last 8760 hours. Coagulation Profile: No results for input(s): INR, PROTIME in the last 168 hours. Thyroid Function Tests: No results for input(s): TSH, T4TOTAL, FREET4, T3FREE, THYROIDAB in the last 72 hours. Lipid Profile: No results for input(s): CHOL, HDL, LDLCALC, TRIG, CHOLHDL, LDLDIRECT in the last 72 hours. Anemia Panel: No results for input(s): VITAMINB12, FOLATE, FERRITIN, TIBC, IRON, RETICCTPCT in the last 72 hours. Urine analysis:    Component Value Date/Time   COLORURINE STRAW (A) 12/13/2020 1245   APPEARANCEUR CLEAR 12/13/2020 1245   LABSPEC 1.019 12/13/2020 1245   PHURINE 7.0 12/13/2020 1245   GLUCOSEU NEGATIVE 12/13/2020 1245   HGBUR NEGATIVE 12/13/2020 1245   [place] 12/13/2020 1245   [place] 12/13/2020 1245   PROTEINUR NEGATIVE 12/13/2020 1245   NITRITE NEGATIVE 12/13/2020 1245   LEUKOCYTESUR NEGATIVE 12/13/2020 1245   Sepsis Labs: Invalid input(s): PROCALCITONIN, [place]  Microbiology: Recent Results (from the past 240 hour(s))  Surgical pcr screen     Status: None   Collection Time: 01/24/21  2:56 AM   Specimen: Nasal Mucosa; Nasal Swab  Result Value Ref Range Status   MRSA, PCR NEGATIVE NEGATIVE Final   Staphylococcus aureus NEGATIVE NEGATIVE Final    Comment: (NOTE) The Xpert SA Assay (FDA approved for NASAL specimens in patients 78 years of age and older), is one component of a comprehensive surveillance program. It is not intended to diagnose infection nor to guide or monitor treatment. Performed at [facility], [place] [address], [place], [place] [address]     Radiology Studies: No results found.    Dustin Golden T. [place]  If 7PM-7AM, please contact  night-coverage [email or web address] 01/29/2021, 10:56 AM

## 2021-01-30 ENCOUNTER — Inpatient Hospital Stay (HOSPITAL_COMMUNITY): Payer: Commercial Managed Care - PPO

## 2021-01-30 DIAGNOSIS — K922 Gastrointestinal hemorrhage, unspecified: Secondary | ICD-10-CM | POA: Diagnosis not present

## 2021-01-30 DIAGNOSIS — I10 Essential (primary) hypertension: Secondary | ICD-10-CM | POA: Diagnosis not present

## 2021-01-30 DIAGNOSIS — E785 Hyperlipidemia, unspecified: Secondary | ICD-10-CM | POA: Diagnosis not present

## 2021-01-30 DIAGNOSIS — C189 Malignant neoplasm of colon, unspecified: Secondary | ICD-10-CM | POA: Diagnosis not present

## 2021-01-30 LAB — CBC
HCT: 31.9 % — ABNORMAL LOW (ref 39.0–52.0)
Hemoglobin: 10.2 g/dL — ABNORMAL LOW (ref 13.0–17.0)
MCH: 25.7 pg — ABNORMAL LOW (ref 26.0–34.0)
MCHC: 32 g/dL (ref 30.0–36.0)
MCV: 80.4 fL (ref 80.0–100.0)
Platelets: 403 10*3/uL — ABNORMAL HIGH (ref 150–400)
RBC: 3.97 MIL/uL — ABNORMAL LOW (ref 4.22–5.81)
RDW: 13.8 % (ref 11.5–15.5)
WBC: 13.2 10*3/uL — ABNORMAL HIGH (ref 4.0–10.5)
nRBC: 0 % (ref 0.0–0.2)

## 2021-01-30 MED ORDER — KCL IN DEXTROSE-NACL 20-5-0.9 MEQ/L-%-% IV SOLN
INTRAVENOUS | Status: DC
  Filled 2021-01-30 (×2): qty 1000

## 2021-01-30 MED ORDER — METOPROLOL TARTRATE 5 MG/5ML IV SOLN
5.0000 mg | Freq: Four times a day (QID) | INTRAVENOUS | Status: DC
  Administered 2021-01-30 – 2021-01-31 (×4): 5 mg via INTRAVENOUS
  Filled 2021-01-30 (×6): qty 5

## 2021-01-30 MED ORDER — AMLODIPINE BESYLATE 10 MG PO TABS: 10.0000 mg | ORAL_TABLET | Freq: Every day | ORAL | Status: DC

## 2021-01-30 MED ORDER — IOHEXOL 300 MG/ML  SOLN
100.0000 mL | Freq: Once | INTRAMUSCULAR | Status: AC | PRN
  Administered 2021-01-30: 100 mL via INTRAVENOUS

## 2021-01-30 MED ORDER — SODIUM CHLORIDE (PF) 0.9 % IJ SOLN
INTRAMUSCULAR | Status: AC
  Filled 2021-01-30: qty 10

## 2021-01-30 MED ORDER — ONDANSETRON HCL 4 MG/2ML IJ SOLN
4.0000 mg | Freq: Four times a day (QID) | INTRAMUSCULAR | Status: DC
  Administered 2021-01-30 – 2021-02-07 (×29): 4 mg via INTRAVENOUS
  Filled 2021-01-30 (×30): qty 2

## 2021-01-30 MED ORDER — ACETAMINOPHEN 325 MG PO TABS
650.0000 mg | ORAL_TABLET | Freq: Four times a day (QID) | ORAL | Status: DC | PRN
  Administered 2021-02-03: 650 mg via ORAL
  Filled 2021-01-30 (×2): qty 2

## 2021-01-30 NOTE — Progress Notes (Signed)
Patient ID: Dustin Golden, male   DOB: [birth year], 65 y.o.   MRN: [id number] abd xray showed a small amount of free air which could be normal at this postop time point. To rule out leak we will obtain a CT abd pel today.

## 2021-01-30 NOTE — Progress Notes (Signed)
PROGRESS NOTE  Dustin Golden:[id number] DOB: 1955/11/02   PCP: [name], PA  Patient is from: Home  DOA: 01/17/2021 LOS: 4  Chief complaints: Rectal bleed  Brief Narrative / Interim history: [name] with PMH of recent left ACA and MCA CVA s/p left ICA thrombectomy followed by vascular stenting with mild residual upper and lower extremity weakness and dysarthria on DAPT with aspirin and Brilinta presenting with BRBPR.  EGD on 4/6 with mild erythematous mucosa in duodenum.  Colonoscopy on 4/7 with ulcerated cecal mass.  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  Pathology with high-grade dysplasia.  Brilinta on hold.  Patient underwent laparotomy with right hemicolectomy on 01/25/2021.  Postop course complicated by ileus requiring NGT.  NG tube removed on 4/16.  Subjective: Patient had an episode of emesis earlier this morning.  Emesis was nonbloody.  Continues to have bowel movements but they are small.  Has not eaten solid food yet.  He tried clear liquid diet yesterday.  Denies abdominal pain, chest pain or shortness of breath  Objective: Vitals:   01/29/21 0651 01/29/21 1400 01/29/21 2136 01/30/21 0540  BP: 134/87 (!) 143/92 128/84 (!) 150/97  Pulse: 100 (!) 106 95 89  Resp: 18  18 18   Temp: 98.9 F (37.2 C) 98.5 F (36.9 C) 98.3 F (36.8 C) 97.6 F (36.4 C)  TempSrc: Oral Oral Oral Oral  SpO2: 91% 95% 97% 99%  Weight:      Height:        Intake/Output Summary (Last 24 hours) at 01/30/2021 1001 Last data filed at 01/30/2021 0900 Gross per 24 hour  Intake 1305 ml  Output 2402 ml  Net -1097 ml   Filed Weights   01/17/21 1352 01/20/21 1135  Weight: 87.2 kg 87.2 kg    Examination:  GENERAL: No apparent distress.  Nontoxic. HEENT: MMM.  Vision and hearing grossly intact.  NECK: Supple.  No apparent JVD.  RESP:  No IWOB.  Fair aeration bilaterally. CVS:  RRR. Heart sounds normal.   ABD/GI/GU: BS+. Abd soft, NTND.  Honeycomb dressing DCI. MSK/EXT:  Moves extremities. No apparent deformity. No edema.  SKIN: no apparent skin lesion or wound NEURO: Awake, alert and oriented appropriately.  Some residual dysarthria. PSYCH: Calm. Normal affect.   Procedures:  4/6-EGD with erythematous mucosa and duodenum 4/7-colonoscopy with fungating mass in cecum.  Pathology with high-grade dysplasia. 4/12-laparotomy and right hemicolectomy.  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 Nausea/vomiting/hiccups-likely due to postop ileus.  Improved. -General surgery recommendations  -X-ray showed small amount of free air.  CT abdomen and pelvis ordered.  -Mobilize  -IS/pulm toilet  -Scheduled Zofran with as needed Compazine -Continue IV fluid for today  Hematochezia: Likely due to the above.  H&H stable. Recent Labs    01/19/21 0415 01/22/21 0745 01/23/21 0448 01/24/21 0430 01/25/21 0351 01/26/21 0435 01/27/21 0420 01/28/21 0409 01/29/21 0420 01/30/21 0539  HGB 10.4* 10.8* 10.1* 10.5* 10.5* 10.7* 12.3* 10.1* 9.6* 10.2*  -Monitor H&H  Recent left ACA and MCA/M2 stroke s/p left ICA mechanical thrombectomy followed by rescue stenting with mid residual upper and lower extremity weakness and dysarthria: Stable. -Restart aspirin and Lipitor -Hopefully we can resume his Brilinta soon.  AKI/azotemia: Likely prerenal from poor p.o.  intake after surgery.  Resolved. Recent Labs    12/16/20 0411 12/20/20 0439 12/21/20 0545 12/27/20 0442 01/17/21 1444 01/18/21 0506 01/25/21 0351 01/26/21 0435 01/28/21 0409 01/29/21 0420  BUN 8 14 13 10 13 12  7* 13 32* 14  CREATININE 1.04 1.31* 1.11 1.09 1.01 1.24 0.98 1.21 1.68* 1.08  -Continue IV fluid for  today -Avoid nephrotoxic meds  Essential hypertension: BP elevated. -Scheduled IV metoprolol with as needed.  Hyponatremia: Resolved. -IV fluid as above  Leukocytosis: Likely demargination.  Resolved. -Continue monitoring  Hyperlipidemia: Stable -Restart Lipitor.   Body mass index is 24.03 kg/m. Nutrition Problem: Increased nutrient needs Etiology: post-op healing Signs/Symptoms: estimated needs Interventions: Refer to RD note for recommendations   DVT prophylaxis:  SCD's Start: 01/22/21 0818 SCDs Start: 01/17/21 2046  Code Status: Full code Family Communication: Updated patient's daughter over the phone. Level of care: Med-Surg Status is: Inpatient  Remains inpatient appropriate because:IV treatments appropriate due to intensity of illness or inability to take PO and Inpatient level of care appropriate due to severity of illness   Dispo: The patient is from: Home              Anticipated d/c is to: Home              Patient currently is not medically stable to d/c.   Difficult to place patient No       Consultants:  Gastroenterology-signed off Neurology over the phone General surgery   Sch Meds:  Scheduled Meds: . aspirin EC  81 mg Oral Daily  . atorvastatin  20 mg Oral Daily  . bupivacaine liposome  20 mL Infiltration Once  . Chlorhexidine Gluconate Cloth  6 each Topical Daily  . lidocaine  1 patch Transdermal Q24H  . lip balm  1 application Topical BID  . metoprolol tartrate  5 mg Intravenous Q6H  . ondansetron (ZOFRAN) IV  4 mg Intravenous Q6H  . pantoprazole (PROTONIX) IV  40 mg Intravenous Q24H   Continuous Infusions: . chlorproMAZINE (THORAZINE) IV 25 mg (01/27/21 3474)  . dextrose 5 % and 0.9 % NaCl with KCl 20 mEq/L 75 mL/hr at 01/30/21 0836  . methocarbamol (ROBAXIN) IV 500 mg (01/30/21 0541)   PRN Meds:.acetaminophen, alum & mag hydroxide-simeth, chlorproMAZINE (THORAZINE) IV, labetalol, magic mouthwash, menthol-cetylpyridinium,  morphine injection, phenol  Antimicrobials: Anti-infectives (From admission, onward)   Start     Dose/Rate Route Frequency Ordered Stop   01/25/21 0600  cefoTEtan (CEFOTAN) 2 g in sodium chloride 0.9 % 100 mL IVPB        2 g 200 mL/hr over 30 Minutes Intravenous On call to O.R. 01/24/21 1405 01/25/21 1110   01/24/21 0600  cefoTEtan (CEFOTAN) 2 g in sodium chloride 0.9 % 100 mL IVPB  Status:  Discontinued        2 g 200 mL/hr over 30 Minutes Intravenous On call to O.R. 01/22/21 0821 01/24/21 1406   01/23/21 1400  neomycin (MYCIFRADIN) tablet 1,000 mg       "And" Linked Group Details   1,000 mg Oral 3 times per day 01/22/21 0821 01/23/21 2233   01/23/21 1400  metroNIDAZOLE (FLAGYL) tablet 1,000 mg       "And" Linked Group Details   1,000 mg Oral 3 times per day 01/22/21 0821 01/23/21 2233       I have personally reviewed the following labs and images: CBC: Recent Labs  Lab 01/26/21 0435 01/27/21 0420 01/28/21 0409 01/29/21 0420 01/30/21 0539  WBC 9.9 13.5*  11.2* 9.4 13.2*  HGB 10.7* 12.3* 10.1* 9.6* 10.2*  HCT 31.9* 36.4* 30.4* 29.9* 31.9*  MCV 78.2* 77.9* 77.7* 80.6 80.4  PLT 306 399 322 342 403*   BMP &GFR Recent Labs  Lab 01/25/21 0351 01/26/21 0435 01/28/21 0409 01/29/21 0420  NA 133* 134* 139 143  K 4.1 4.2 3.8 3.7  CL 100 100 104 109  CO2 25 23 25 25   GLUCOSE 101* 164* 148* 128*  BUN 7* 13 32* 14  CREATININE 0.98 1.21 1.68* 1.08  CALCIUM 9.3 9.0 9.1 8.8*  MG 2.0 1.9 2.1 2.3  PHOS  --  4.7* 4.9* 3.4   Estimated Creatinine Clearance: 82.6 mL/min (by C-G formula based on SCr of 1.08 mg/dL). Liver & Pancreas: Recent Labs  Lab 01/25/21 0351 01/26/21 0435 01/28/21 0409 01/29/21 0420  AST 19  --   --  17  ALT 16  --   --  14  ALKPHOS 107  --   --  72  BILITOT 0.6  --   --  0.4  PROT 7.7  --   --  7.0  ALBUMIN 4.2 3.8 3.5 3.5   No results for input(s): LIPASE, AMYLASE in the last 168 hours. No results for input(s): AMMONIA in the last 168  hours. Diabetic: No results for input(s): HGBA1C in the last 72 hours. No results for input(s): GLUCAP in the last 168 hours. Cardiac Enzymes: No results for input(s): CKTOTAL, CKMB, CKMBINDEX, TROPONINI in the last 168 hours. No results for input(s): PROBNP in the last 8760 hours. Coagulation Profile: No results for input(s): INR, PROTIME in the last 168 hours. Thyroid Function Tests: No results for input(s): TSH, T4TOTAL, FREET4, T3FREE, THYROIDAB in the last 72 hours. Lipid Profile: No results for input(s): CHOL, HDL, LDLCALC, TRIG, CHOLHDL, LDLDIRECT in the last 72 hours. Anemia Panel: No results for input(s): VITAMINB12, FOLATE, FERRITIN, TIBC, IRON, RETICCTPCT in the last 72 hours. Urine analysis:    Component Value Date/Time   COLORURINE STRAW (A) 12/13/2020 1245   APPEARANCEUR CLEAR 12/13/2020 1245   LABSPEC 1.019 12/13/2020 1245   PHURINE 7.0 12/13/2020 1245   GLUCOSEU NEGATIVE 12/13/2020 1245   HGBUR NEGATIVE 12/13/2020 1245   [place] 12/13/2020 1245   [place] 12/13/2020 1245   PROTEINUR NEGATIVE 12/13/2020 1245   NITRITE NEGATIVE 12/13/2020 1245   LEUKOCYTESUR NEGATIVE 12/13/2020 1245   Sepsis Labs: Invalid input(s): PROCALCITONIN, [place]  Microbiology: Recent Results (from the past 240 hour(s))  Surgical pcr screen     Status: None   Collection Time: 01/24/21  2:56 AM   Specimen: Nasal Mucosa; Nasal Swab  Result Value Ref Range Status   MRSA, PCR NEGATIVE NEGATIVE Final   Staphylococcus aureus NEGATIVE NEGATIVE Final    Comment: (NOTE) The Xpert SA Assay (FDA approved for NASAL specimens in patients 81 years of age and older), is one component of a comprehensive surveillance program. It is not intended to diagnose infection nor to guide or monitor treatment. Performed at [facility], [place] [address] [place], [place] [address]     Radiology Studies: DG Abd 2 Views  Result Date: 01/30/2021 CLINICAL DATA:   Small bowel obstruction. EXAM: ABDOMEN - 2 VIEW COMPARISON:  01/27/2021 FINDINGS: Nasogastric tube has been removed. There is persistent significant dilatation of small bowel loops. The loops measure up to 4.5 centimeters and there is now evidence for small bowel wall thickening. Surgical clips overlie the midline of the abdomen. Small amount of free intraperitoneal air beneath the  RIGHT hemidiaphragm, increased since prior study. IMPRESSION: Persistent small bowel obstruction. There is now evidence for small bowel wall thickening. Free intraperitoneal air possibly postoperative. These results were called by telephone at the time of interpretation on 01/30/2021 at 9:35 am to provider Autumn Messing III , who verbally acknowledged these results. Electronically Signed   By: Nolon Nations M.D.   On: 01/30/2021 09:58      Katyra Tomassetti T. [place]  If 7PM-7AM, please contact night-coverage [email or web address] 01/30/2021, 10:01 AM

## 2021-01-30 NOTE — Plan of Care (Signed)
  Problem: Coping: Goal: Level of anxiety will decrease Outcome: Progressing   Problem: Pain Managment: Goal: General experience of comfort will improve Outcome: Progressing   

## 2021-01-30 NOTE — Progress Notes (Signed)
5 Days Post-Op   Subjective/Chief Complaint: Vomited this am. Also has another bm this am   Objective: Vital signs in last 24 hours: Temp:  [97.6 F (36.4 C)-98.5 F (36.9 C)] 97.6 F (36.4 C) (04/17 0540) Pulse Rate:  [89-106] 89 (04/17 0540) Resp:  [18] 18 (04/17 0540) BP: (128-150)/(84-97) 150/97 (04/17 0540) SpO2:  [95 %-99 %] 99 % (04/17 0540) Last BM Date: 01/29/21  Intake/Output from previous day: 04/16 0701 - 04/17 0700 In: 560 [P.O.:360; IV Piggyback:200] Out: 2603 [Urine:2300; Emesis/NG output:300; Stool:3] Intake/Output this shift: Total I/O In: 745 [P.O.:745] Out: -   General appearance: alert and cooperative Resp: clear to auscultation bilaterally Cardio: regular rate and rhythm GI: soft, distended. mild tenderness. incision looks good. few bs  Lab Results:  Recent Labs    01/29/21 0420 01/30/21 0539  WBC 9.4 13.2*  HGB 9.6* 10.2*  HCT 29.9* 31.9*  PLT 342 403*   BMET Recent Labs    01/28/21 0409 01/29/21 0420  NA 139 143  K 3.8 3.7  CL 104 109  CO2 25 25  GLUCOSE 148* 128*  BUN 32* 14  CREATININE 1.68* 1.08  CALCIUM 9.1 8.8*   PT/INR No results for input(s): LABPROT, INR in the last 72 hours. ABG No results for input(s): PHART, HCO3 in the last 72 hours.  Invalid input(s): PCO2, PO2  Studies/Results: No results found.  Anti-infectives: Anti-infectives (From admission, onward)   Start     Dose/Rate Route Frequency Ordered Stop   01/25/21 0600  cefoTEtan (CEFOTAN) 2 g in sodium chloride 0.9 % 100 mL IVPB        2 g 200 mL/hr over 30 Minutes Intravenous On call to O.R. 01/24/21 1405 01/25/21 1110   01/24/21 0600  cefoTEtan (CEFOTAN) 2 g in sodium chloride 0.9 % 100 mL IVPB  Status:  Discontinued        2 g 200 mL/hr over 30 Minutes Intravenous On call to O.R. 01/22/21 0821 01/24/21 1406   01/23/21 1400  neomycin (MYCIFRADIN) tablet 1,000 mg       "And" Linked Group Details   1,000 mg Oral 3 times per day 01/22/21 0821 01/23/21  2233   01/23/21 1400  metroNIDAZOLE (FLAGYL) tablet 1,000 mg       "And" Linked Group Details   1,000 mg Oral 3 times per day 01/22/21 0821 01/23/21 2233      Assessment/Plan: s/p Procedure(s): LAPAROSCOPIC RIGHT COLECTOMY (N/A) npo now after vomiting this am  Will check abd xrays. Restart IVF Postop ileus persists Ambulate HTN Former heavy smoker - quit 11/2020 Alcohol dependence Polysubstance abuse - THC, cocaine (most recently 1 month ago) Anemia  Recentleft ICA and left MCA/M2 infarctions/p mechanical thrombectomy and left carotid stenting and angioplasty 12/13/20 on aspirin and brillinta (last dose 01/17/21 in AM)  - per neurology "it isideal to have some antiplatelet on board for at least 6 weeks to prevent stent thrombosis.But in his case, he requires a procedure urgently and emergently. He should be on a heparin drip-better than being on nothing and then antiplatelets resumed as soon as able postsurgically." -hgb stable, if remains stable tomorrow can resume Brilinta at that time  POD2, s/p lap converted to open R hemicolectomy forPartially obstructing tumor in the cecum, Dr. [name] 01/25/21 -post-op ileus clinically and on KUB, place NG tube to LIWS -needs to mobilize. PT/OT/SLP consults placed -H&H stable hgb 12.3 from 10.7,ok to resumeBrillinta once NG out -IS/pulm toilet -path pending, follow  ID -none currently VTE -  SCDs, ASA FEN -NPO, NG to LIWS Foley -none Follow up -Dr. [name]  LOS: 11 days    Dustin Golden 01/30/2021

## 2021-01-31 LAB — CBC
HCT: 27.9 % — ABNORMAL LOW (ref 39.0–52.0)
Hemoglobin: 9.2 g/dL — ABNORMAL LOW (ref 13.0–17.0)
MCH: 26.2 pg (ref 26.0–34.0)
MCHC: 33 g/dL (ref 30.0–36.0)
MCV: 79.5 fL — ABNORMAL LOW (ref 80.0–100.0)
Platelets: 334 10*3/uL (ref 150–400)
RBC: 3.51 MIL/uL — ABNORMAL LOW (ref 4.22–5.81)
RDW: 13.7 % (ref 11.5–15.5)
WBC: 12.1 10*3/uL — ABNORMAL HIGH (ref 4.0–10.5)
nRBC: 0 % (ref 0.0–0.2)

## 2021-01-31 MED ORDER — TICAGRELOR 90 MG PO TABS
90.0000 mg | ORAL_TABLET | Freq: Two times a day (BID) | ORAL | Status: DC
  Administered 2021-01-31 (×2): 90 mg via ORAL
  Filled 2021-01-31 (×3): qty 1

## 2021-01-31 MED ORDER — SODIUM CHLORIDE 0.9 % IV SOLN
12.5000 mg | Freq: Four times a day (QID) | INTRAVENOUS | Status: AC | PRN
  Administered 2021-01-31 – 2021-02-01 (×2): 12.5 mg via INTRAVENOUS
  Filled 2021-01-31 (×2): qty 12.5
  Filled 2021-01-31: qty 0.5

## 2021-01-31 MED ORDER — METHOCARBAMOL 1000 MG/10ML IJ SOLN
500.0000 mg | Freq: Four times a day (QID) | INTRAVENOUS | Status: DC
  Administered 2021-01-31 – 2021-02-07 (×24): 500 mg via INTRAVENOUS
  Filled 2021-01-31 (×2): qty 500
  Filled 2021-01-31: qty 5
  Filled 2021-01-31 (×21): qty 500

## 2021-01-31 MED ORDER — METOPROLOL TARTRATE 5 MG/5ML IV SOLN
2.5000 mg | Freq: Four times a day (QID) | INTRAVENOUS | Status: DC
  Administered 2021-01-31 – 2021-02-07 (×27): 2.5 mg via INTRAVENOUS
  Filled 2021-01-31 (×27): qty 5

## 2021-01-31 NOTE — Progress Notes (Signed)
Occupational Therapy Treatment Patient Details Name: Dustin Golden MRN: [id number] DOB: 1956/09/04 Today's Date: 01/31/2021    History of present illness Pt admitted with melena and anemia.  Colonoscopy on 4/7 with ulcerated cecal mass.  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  Pathology with high-grade dysplasia.  General surgery consulted   s/p lap converted to open R hemicolectomy for Partially obstructing tumor in the cecum per  Dr. [name] on  01/25/21. PMHx: recent CVA with residual mild R side weakness, mild right side inattention/awarenss and dysarthria   OT comments  Pt reports he has been doing laps in the hall with his IV pole and following through with HEP for R UE established prior to admission. Pt standing at sink with supervision using R UE for B UE tasks and as lead for 50% of toothbrushing. Pt uses vision to compensate for impaired sensation in R hand. Report having difficulty with pericare with IV now in L hand.   Follow Up Recommendations  Outpatient OT    Equipment Recommendations  None recommended by OT    Recommendations for Other Services      Precautions / Restrictions Restrictions Weight Bearing Restrictions: No       Mobility Bed Mobility               General bed mobility comments: in chair    Transfers   Equipment used: None Transfers: Sit to/from Stand Sit to Stand: Modified independent (Device/Increase time)         General transfer comment: pt managing over bed table and IV pole with urine bag hanging from it.    Balance Overall balance assessment: Needs assistance   Sitting balance-Leahy Scale: Good       Standing balance-Leahy Scale: Good Standing balance comment: able to pick item up from floor and recover without LOB                           ADL either performed or assessed with clinical judgement   ADL Overall ADL's : Needs  assistance/impaired Eating/Feeding: Modified independent;Sitting   Grooming: Oral care;Standing;Supervision/safety Grooming Details (indicate cue type and reason): cues needed for compensatory strategie when opening toothbrush package, pt able to set up toothbrush with toothpaste with increased time, used R hand for 50% of toothbrushing task.                                     Vision       Perception     Praxis      Cognition Arousal/Alertness: Awake/alert Behavior During Therapy: WFL for tasks assessed/performed Overall Cognitive Status: Within Functional Limits for tasks assessed                                          Exercises     Shoulder Instructions       General Comments      Pertinent Vitals/ Pain       Pain Assessment: No/denies pain  Home Living  Prior Functioning/Environment              Frequency  Min 1X/week        Progress Toward Goals  OT Goals(current goals can now be found in the care plan section)  Progress towards OT goals: Progressing toward goals  Acute Rehab OT Goals Patient Stated Goal: return home OT Goal Formulation: With patient Time For Goal Achievement: 02/11/21 Potential to Achieve Goals: Good  Plan Discharge plan remains appropriate    Co-evaluation                 AM-PAC OT "6 Clicks" Daily Activity     Outcome Measure   Help from another person eating meals?: None Help from another person taking care of personal grooming?: A Little Help from another person toileting, which includes using toliet, bedpan, or urinal?: A Little Help from another person bathing (including washing, rinsing, drying)?: A Little Help from another person to put on and taking off regular upper body clothing?: A Little Help from another person to put on and taking off regular lower body clothing?: A Little 6 Click Score: 19    End of  Session    OT Visit Diagnosis: Hemiplegia and hemiparesis;Muscle weakness (generalized) (M62.81) Hemiplegia - Right/Left: Right Hemiplegia - dominant/non-dominant: Dominant Hemiplegia - caused by: Cerebral infarction   Activity Tolerance Patient tolerated treatment well   Patient Left in chair;with call bell/phone within reach;with nursing/sitter in room   Nurse Communication          Time: 3428-7681 OT Time Calculation (min): 21 min  Charges: OT General Charges $OT Visit: 1 Visit OT Treatments $Self Care/Home Management : 8-22 mins  [name], OTR/L Acute Rehabilitation Services Pager: [phone] Office: [phone]   [name] 01/31/2021, 11:57 AM

## 2021-01-31 NOTE — Evaluation (Signed)
Clinical/Bedside Swallow Evaluation Patient Details  Name: Dustin Golden MRN: [id number] Date of Birth: [birth year]  Today's Date: 01/31/2021 Time: SLP Start Time (ACUTE ONLY): 1015 SLP Stop Time (ACUTE ONLY): 1035 SLP Time Calculation (min) (ACUTE ONLY): 20 min  Past Medical History:  Past Medical History:  Diagnosis Date  . Hypertension   . Stroke [facility])    Past Surgical History:  Past Surgical History:  Procedure Laterality Date  . APPENDECTOMY    . BIOPSY  01/20/2021   Procedure: BIOPSY;  Surgeon: Arta Silence, MD;  Location: [facility] ENDOSCOPY;  Service: Endoscopy;;  . COLONOSCOPY WITH PROPOFOL Left 01/20/2021   Procedure: COLONOSCOPY WITH PROPOFOL;  Surgeon: Arta Silence, MD;  Location: [facility] ENDOSCOPY;  Service: Endoscopy;  Laterality: Left;  . ESOPHAGOGASTRODUODENOSCOPY N/A 01/19/2021   Procedure: ESOPHAGOGASTRODUODENOSCOPY (EGD);  Surgeon: Arta Silence, MD;  Location: [facility];  Service: Endoscopy;  Laterality: N/A;  . gastric ulcer surgery    . IR CT HEAD LTD  12/13/2020  . IR INTRAVSC STENT CERV CAROTID W/EMB-PROT MOD SED INCL ANGIO  12/14/2020  . IR PERCUTANEOUS ART THROMBECTOMY/INFUSION INTRACRANIAL INC DIAG ANGIO  12/13/2020  . IR US GUIDE VASC ACCESS RIGHT  12/13/2020  . LAPAROSCOPIC RIGHT HEMI COLECTOMY N/A 01/25/2021   Procedure: LAPAROSCOPIC RIGHT COLECTOMY;  Surgeon: Ralene Ok, MD;  Location: [facility] ORS;  Service: General;  Laterality: N/A;  . RADIOLOGY WITH ANESTHESIA N/A 12/13/2020   Procedure: IR WITH ANESTHESIA;  Surgeon: Luanne Bras, MD;  Location: [facility];  Service: Radiology;  Laterality: N/A;   HPI:  Pt admitted with melena and anemia.  Colonoscopy on 4/7 with ulcerated cecal mass.  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  Pathology with high-grade dysplasia.  General surgery consulted   s/p lap converted to open R hemicolectomy for Partially obstructing tumor in the cecum per  Dr.  [name] on  01/25/21. PMHx: recent CVA with residual mild R side weakness, mild right side inattention/awarenss and dysarthria   Assessment / Plan / Recommendation Clinical Impression  Patient presents with mild oral dysphagia but with a pharyngeal phase of swallow that appears to be [facility]. He has recently been allowed to start clear liquids by MD but with monitoring for nausea, vomitting, etc. Upon SLP entering room, he had recently finished his entire tray of clear liquids diet and did not report any difficulties. He said when he woke up this morning he felt very hungry. Patient was recent admission to CIR previous month and was able to be upgraded from Dys 3 solids to regular solids with modified independence for following swallow precautions and strategies. SLP is recommending patient continue with clear liquids diet  but oropharyngeally, he should tolerate upgrading to some solids without difficulty. SLP Visit Diagnosis: Dysphagia, unspecified (R13.10)    Aspiration Risk  Mild aspiration risk    Diet Recommendation Thin liquid (continue with clear liquids)   Liquid Administration via: Cup;Straw Medication Administration: Whole meds with liquid Supervision: Patient able to self feed Compensations: Lingual sweep for clearance of pocketing;Slow rate;Small sips/bites    Other  Recommendations Oral Care Recommendations: Oral care BID   Follow up Recommendations None      Frequency and Duration min 1 x/week  1 week       Prognosis Prognosis for Safe Diet Advancement: Good      Swallow Study   General Date of Onset: 01/17/21 HPI: Pt admitted with melena and anemia.  Colonoscopy on  4/7 with ulcerated cecal mass.  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  Pathology with high-grade dysplasia.  General surgery consulted   s/p lap converted to open R hemicolectomy for Partially obstructing tumor in the cecum per  Dr.  [name] on  01/25/21. PMHx: recent CVA with residual mild R side weakness, mild right side inattention/awarenss and dysarthria Type of Study: Bedside Swallow Evaluation Previous Swallow Assessment: MBS 12/14/2020- dys3/thin advised Diet Prior to this Study: Thin liquids (clear liquids) Temperature Spikes Noted: No Respiratory Status: Room air History of Recent Intubation: No Behavior/Cognition: Alert;Cooperative;Pleasant mood Oral Cavity Assessment: Within Functional Limits Oral Care Completed by SLP: No Oral Cavity - Dentition: Adequate natural dentition Vision: Functional for self-feeding Self-Feeding Abilities: Able to feed self Patient Positioning: Upright in chair Baseline Vocal Quality: Normal Volitional Cough: Strong Volitional Swallow: Able to elicit    Oral/Motor/Sensory Function Overall Oral Motor/Sensory Function: Mild impairment Facial ROM: Reduced right Facial Symmetry: Within Functional Limits Facial Strength: Reduced right Lingual ROM: Within Functional Limits Lingual Symmetry: Within Functional Limits Lingual Strength: Reduced Velum: Within Functional Limits Mandible: Within Functional Limits   Ice Chips Ice chips: Not tested   Thin Liquid Thin Liquid: Within functional limits Presentation: Straw;Self Fed    Nectar Thick     Honey Thick     Puree Puree: Not tested   Solid     Solid: Not tested     Sonia Baller, MA, CCC-SLP Speech Therapy

## 2021-01-31 NOTE — Progress Notes (Signed)
6 Days Post-Op   Subjective/Chief Complaint: Denies nausea or vomiting. Reports a BM at 5 PM and 8 AM. Having some flatus but not a normal amount. States belching has decreased. Reports pain worse with movement - improves with robaxin, feels morphine doesn't help anymore. Pulled over 1250 cc on IS  Objective: Vital signs in last 24 hours: Temp:  [98 F (36.7 C)-98.8 F (37.1 C)] 98 F (36.7 C) (04/18 0511) Pulse Rate:  [91-101] 101 (04/18 0511) Resp:  [16-20] 18 (04/18 0511) BP: (97-109)/(62-78) 109/78 (04/18 0511) SpO2:  [93 %-100 %] 100 % (04/18 0511) Last BM Date: 01/30/21  Intake/Output from previous day: 04/17 0701 - 04/18 0700 In: 2676.5 [P.O.:745; I.V.:1698; IV Piggyback:233.4] Out: 900 [Urine:900] Intake/Output this shift: No intake/output data recorded.  General appearance: alert and cooperative Resp: clear to auscultation bilaterally Cardio: regular rate and rhythm GI: soft, distended, hypoactive BS, mild tenderness. incision c/d/i w staples- honeycomb removed, no cellulitis or drainage.   Lab Results:  Recent Labs    01/30/21 0539 01/31/21 0419  WBC 13.2* 12.1*  HGB 10.2* 9.2*  HCT 31.9* 27.9*  PLT 403* 334   BMET Recent Labs    01/29/21 0420  NA 143  K 3.7  CL 109  CO2 25  GLUCOSE 128*  BUN 14  CREATININE 1.08  CALCIUM 8.8*   PT/INR No results for input(s): LABPROT, INR in the last 72 hours. ABG No results for input(s): PHART, HCO3 in the last 72 hours.  Invalid input(s): PCO2, PO2  Studies/Results: CT ABDOMEN PELVIS W CONTRAST  Result Date: 01/30/2021 CLINICAL DATA:  Status post right colectomy EXAM: CT ABDOMEN AND PELVIS WITH CONTRAST TECHNIQUE: Multidetector CT imaging of the abdomen and pelvis was performed using the standard protocol following bolus administration of intravenous contrast. CONTRAST:  126mL OMNIPAQUE IOHEXOL 300 MG/ML  SOLN COMPARISON:  01/20/2021 FINDINGS: Lower chest: Mild bibasilar atelectasis is noted. Hepatobiliary: No  focal liver abnormality is seen. No gallstones, gallbladder wall thickening, or biliary dilatation. Pancreas: Unremarkable. No pancreatic ductal dilatation or surrounding inflammatory changes. Spleen: Normal in size without focal abnormality. Adrenals/Urinary Tract: The adrenal glands are within normal limits. Kidneys demonstrate a normal enhancement pattern bilaterally. No renal calculi are seen. Delayed images demonstrate normal excretion bilaterally. The bladder is decompressed by Foley catheter. Stomach/Bowel: Stomach is well distended with fluid. Gastric catheter has been removed in the interval. Small-bowel dilatation is noted from the level of the duodenal bulb to at least the mid ileum. The transition point is best seen in the right lower quadrant but several loops prior to the new small bowel colon anastomosis. These changes are consistent with a postoperative ileus. The colon more distally is within normal limits. Changes consistent with the prior partial colectomy and reanastomosis are noted. Vascular/Lymphatic: Aortic atherosclerosis. No enlarged abdominal or pelvic lymph nodes. Reproductive: Prostate is unremarkable. Other: No abdominal wall hernia or abnormality. No abdominopelvic ascites. Musculoskeletal: No acute or significant osseous findings. IMPRESSION: Changes consistent with postoperative ileus with a transition zone in the mid ileum. The stomach is well distended with fluid and the postoperative ileus extends throughout the proximal small bowel. Postsurgical changes in the right colon consistent with the recent history. Electronically Signed   By: Inez Catalina M.D.   On: 01/30/2021 15:53   DG Abd 2 Views  Result Date: 01/30/2021 CLINICAL DATA:  Small bowel obstruction. EXAM: ABDOMEN - 2 VIEW COMPARISON:  01/27/2021 FINDINGS: Nasogastric tube has been removed. There is persistent significant dilatation of small bowel loops.  The loops measure up to 4.5 centimeters and there is now evidence  for small bowel wall thickening. Surgical clips overlie the midline of the abdomen. Small amount of free intraperitoneal air beneath the RIGHT hemidiaphragm, increased since prior study. IMPRESSION: Persistent small bowel obstruction. There is now evidence for small bowel wall thickening. Free intraperitoneal air possibly postoperative. These results were called by telephone at the time of interpretation on 01/30/2021 at 9:35 am to provider Autumn Messing III , who verbally acknowledged these results. Electronically Signed   By: Nolon Nations M.D.   On: 01/30/2021 09:58    Anti-infectives: Anti-infectives (From admission, onward)   Start     Dose/Rate Route Frequency Ordered Stop   01/25/21 0600  cefoTEtan (CEFOTAN) 2 g in sodium chloride 0.9 % 100 mL IVPB        2 g 200 mL/hr over 30 Minutes Intravenous On call to O.R. 01/24/21 1405 01/25/21 1110   01/24/21 0600  cefoTEtan (CEFOTAN) 2 g in sodium chloride 0.9 % 100 mL IVPB  Status:  Discontinued        2 g 200 mL/hr over 30 Minutes Intravenous On call to O.R. 01/22/21 0821 01/24/21 1406   01/23/21 1400  neomycin (MYCIFRADIN) tablet 1,000 mg       "And" Linked Group Details   1,000 mg Oral 3 times per day 01/22/21 0821 01/23/21 2233   01/23/21 1400  metroNIDAZOLE (FLAGYL) tablet 1,000 mg       "And" Linked Group Details   1,000 mg Oral 3 times per day 01/22/21 0821 01/23/21 2233      Assessment/Plan: s/p Procedure(s): LAPAROSCOPIC RIGHT COLECTOMY (N/A) npo now after vomiting this am  Will check abd xrays. 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 He should be on a heparin drip-better than being on nothing and then antiplatelets resumed as soon as able postsurgically." -hgb stable, if remains stable tomorrow can resume Brilinta at that time  POD5, s/p lap converted to open R hemicolectomy for partially obstructing tumor in the cecum, Dr. [name] 01/25/21 -surgical path: Invasive colorectal adenocarcinoma, 4.4 cm, negative margins, 0/28 nodes. - AFVSS, WBC 12 from 13 - post-op ileus slowly improving -- NG was removed 4/16 due to return of some bowel function but patient does still have distention, hypoactive BS, minimal flatus, and dilated stomach and small bowel on his CT 4/18. Having small BMs. - needs to mobilize. PT/OT/SLP  -H&H stable,ok to resumeBrillinta  - IS/pulm toilet  ID -none currently VTE -SCDs, ASA, ok to resume brillinta  FEN -CLD -- will need to back off to NPO and have NGT replaced if he vomits. For recurrent emesis he would also need PICC TPN Foley -none Follow up -Dr. [name]  LOS: 12 days    [place] 01/31/2021

## 2021-01-31 NOTE — Progress Notes (Signed)
PROGRESS NOTE  Dustin Golden HQP:[id number] DOB: 08/14/1956   PCP: [name], PA  Patient is from: Home  DOA: 01/17/2021 LOS: 40  Chief complaints: Rectal bleed  Brief Narrative / Interim history: [name] with PMH of recent left ACA and MCA CVA s/p left ICA thrombectomy followed by vascular stenting with mild residual upper and lower extremity weakness and dysarthria on DAPT with aspirin and Brilinta presenting with BRBPR.  EGD on 4/6 with mild erythematous mucosa in duodenum.  Colonoscopy on 4/7 with ulcerated cecal mass.  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  Pathology with high-grade dysplasia.  Brilinta on hold.  Patient underwent laparotomy with right hemicolectomy on 01/25/2021.  Postop course complicated by ileus requiring NGT.  NG tube removed on 4/16.  CT abdomen and pelvis on 4/17 with ileus.   Subjective: Seen and examined earlier this morning.  No major events overnight or this morning.  No further emesis.  Reports having bowel movements.  Denies abdominal pain.  Ambulating.  Feels hungry.  He would like to eat.  Objective: Vitals:   01/30/21 1317 01/30/21 1942 01/30/21 2329 01/31/21 0511  BP: 106/68 103/78 97/62 109/78  Pulse: 91 96 96 (!) 101  Resp: 16 20  18   Temp: 98.1 F (36.7 C) 98.8 F (37.1 C)  98 F (36.7 C)  TempSrc: Oral Oral  Oral  SpO2: 97% 93%  100%  Weight:      Height:        Intake/Output Summary (Last 24 hours) at 01/31/2021 1303 Last data filed at 01/31/2021 1000 Gross per 24 hour  Intake 2096.47 ml  Output 850 ml  Net 1246.47 ml   Filed Weights   01/17/21 1352 01/20/21 1135  Weight: 87.2 kg 87.2 kg    Examination:  GENERAL: No apparent distress.  Nontoxic. HEENT: MMM.  Vision and hearing grossly intact.  NECK: Supple.  No apparent JVD.  RESP:  No IWOB.  Fair aeration bilaterally. CVS:  RRR. Heart sounds normal.  ABD/GI/GU: BS+. Abd soft, NTND.   Surgical wound DCI.  Staples in place. MSK/EXT:  Moves extremities. No apparent deformity. No edema.  SKIN: no apparent skin lesion or wound NEURO: Awake, alert and oriented appropriately.  Residual dysarthria. PSYCH: Calm. Normal affect.  Procedures:  4/6-EGD with erythematous mucosa and duodenum 4/7-colonoscopy with fungating mass in cecum.  Pathology with high-grade dysplasia. 4/12-laparotomy and right hemicolectomy.  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 Nausea/vomiting/hiccups-likely due to postop ileus.  Improved. -General surgery recommendations  -Clear liquid diet  -Mobilize  -IS/pulm toilet -Continue scheduled Zofran with as needed Compazine -Discontinue IV fluids  Hematochezia: Likely due to the above.  H&H relatively stable stable. Recent Labs    01/22/21 0745 01/23/21 0448 01/24/21 0430 01/25/21 0351 01/26/21 0435 01/27/21 0420 01/28/21 0409 01/29/21 0420 01/30/21 0539 01/31/21 0419  HGB 10.8* 10.1* 10.5* 10.5* 10.7* 12.3* 10.1* 9.6* 10.2* 9.2*  -Monitor H&H  Recent left ACA and MCA/M2 stroke s/p left ICA mechanical thrombectomy followed by rescue stenting with mid residual upper and lower extremity weakness and dysarthria: Stable. -Continue aspirin and Lipitor -Restart Brilinta  AKI/azotemia: Likely prerenal from poor p.o. intake after surgery.  Resolved. Recent Labs    12/16/20 0411 12/20/20 0439 12/21/20 0545 12/27/20  3329 01/17/21 1444 01/18/21 0506 01/25/21 0351 01/26/21 0435 01/28/21 0409 01/29/21 0420  BUN 8 14 13 10 13 12  7* 13 32* 14  CREATININE 1.04 1.31* 1.11 1.09 1.01 1.24 0.98 1.21 1.68* 1.08  -Discontinue IV fluid -Avoid nephrotoxic meds  Essential hypertension: BP elevated. -Continue IV metoprolol  and instead of home amlodipine for now.  Decrease to 2.5 mg every 6 hours  Hyponatremia: Resolved.  Leukocytosis: Likely demargination.  Improved -Continue monitoring  Hyperlipidemia: Stable -Continue home Lipitor   Body mass index is 24.03 kg/m. Nutrition Problem: Increased nutrient needs Etiology: post-op healing Signs/Symptoms: estimated needs Interventions: Refer to RD note for recommendations   DVT prophylaxis:  SCD's Start: 01/22/21 0818 SCDs Start: 01/17/21 2046  Code Status: Full code Family Communication: Updated patient's daughter over the phone. Level of care: Med-Surg Status is: Inpatient  Remains inpatient appropriate because:Inpatient level of care appropriate due to severity of illness/postop ileus after ex lap and right colectomy   Dispo: The patient is from: Home              Anticipated d/c is to: Home              Patient currently is not medically stable to d/c.   Difficult to place patient No       Consultants:  Gastroenterology-signed off Neurology over the phone General surgery   Sch Meds:  Scheduled Meds: . aspirin EC  81 mg Oral Daily  . atorvastatin  20 mg Oral Daily  . bupivacaine liposome  20 mL Infiltration Once  . Chlorhexidine Gluconate Cloth  6 each Topical Daily  . lidocaine  1 patch Transdermal Q24H  . lip balm  1 application Topical BID  . metoprolol tartrate  5 mg Intravenous Q6H  . ondansetron (ZOFRAN) IV  4 mg Intravenous Q6H  . pantoprazole (PROTONIX) IV  40 mg Intravenous Q24H  . ticagrelor  90 mg Oral BID   Continuous Infusions: . chlorproMAZINE (THORAZINE) IV 25 mg (01/30/21 1121)  . dextrose 5 % and 0.9 % NaCl with KCl 20 mEq/L Stopped (01/31/21 0539)  . methocarbamol (ROBAXIN) IV 500 mg (01/31/21 1146)   PRN Meds:.acetaminophen, alum & mag hydroxide-simeth, chlorproMAZINE (THORAZINE) IV, labetalol, magic mouthwash, menthol-cetylpyridinium, morphine injection, phenol  Antimicrobials: Anti-infectives (From  admission, onward)   Start     Dose/Rate Route Frequency Ordered Stop   01/25/21 0600  cefoTEtan (CEFOTAN) 2 g in sodium chloride 0.9 % 100 mL IVPB        2 g 200 mL/hr over 30 Minutes Intravenous On call to O.R. 01/24/21 1405 01/25/21 1110   01/24/21 0600  cefoTEtan (CEFOTAN) 2 g in sodium chloride 0.9 % 100 mL IVPB  Status:  Discontinued        2 g 200 mL/hr over 30 Minutes Intravenous On call to O.R. 01/22/21 0821 01/24/21 1406   01/23/21 1400  neomycin (MYCIFRADIN) tablet 1,000 mg       "And" Linked Group Details   1,000 mg Oral 3 times per day 01/22/21 0821 01/23/21 2233   01/23/21 1400  metroNIDAZOLE (FLAGYL) tablet 1,000 mg       "And" Linked Group Details   1,000 mg Oral 3 times per day 01/22/21 0821 01/23/21 2233       I have personally reviewed the following labs and images: CBC: Recent Labs  Lab 01/27/21 0420 01/28/21 0409 01/29/21 0420 01/30/21 0539 01/31/21 0419  WBC 13.5* 11.2* 9.4 13.2* 12.1*  HGB 12.3* 10.1* 9.6* 10.2* 9.2*  HCT 36.4* 30.4* 29.9* 31.9* 27.9*  MCV 77.9* 77.7* 80.6 80.4 79.5*  PLT 399 322 342 403* 334   BMP &GFR Recent Labs  Lab 01/25/21 0351 01/26/21 0435 01/28/21 0409 01/29/21 0420  NA 133* 134* 139 143  K 4.1 4.2 3.8 3.7  CL 100 100 104 109  CO2 25 23 25 25   GLUCOSE 101* 164* 148* 128*  BUN 7* 13 32* 14  CREATININE 0.98 1.21 1.68* 1.08  CALCIUM 9.3 9.0 9.1 8.8*  MG 2.0 1.9 2.1 2.3  PHOS  --  4.7* 4.9* 3.4   Estimated Creatinine Clearance: 82.6 mL/min (by C-G formula based on SCr of 1.08 mg/dL). Liver & Pancreas: Recent Labs  Lab 01/25/21 0351 01/26/21 0435 01/28/21 0409 01/29/21 0420  AST 19  --   --  17  ALT 16  --   --  14  ALKPHOS 107  --   --  72  BILITOT 0.6  --   --  0.4  PROT 7.7  --   --  7.0  ALBUMIN 4.2 3.8 3.5 3.5   No results for input(s): LIPASE, AMYLASE in the last 168 hours. No results for input(s): AMMONIA in the last 168 hours. Diabetic: No results for input(s): HGBA1C in the last 72 hours. No  results for input(s): GLUCAP in the last 168 hours. Cardiac Enzymes: No results for input(s): CKTOTAL, CKMB, CKMBINDEX, TROPONINI in the last 168 hours. No results for input(s): PROBNP in the last 8760 hours. Coagulation Profile: No results for input(s): INR, PROTIME in the last 168 hours. Thyroid Function Tests: No results for input(s): TSH, T4TOTAL, FREET4, T3FREE, THYROIDAB in the last 72 hours. Lipid Profile: No results for input(s): CHOL, HDL, LDLCALC, TRIG, CHOLHDL, LDLDIRECT in the last 72 hours. Anemia Panel: No results for input(s): VITAMINB12, FOLATE, FERRITIN, TIBC, IRON, RETICCTPCT in the last 72 hours. Urine analysis:    Component Value Date/Time   COLORURINE STRAW (A) 12/13/2020 1245   APPEARANCEUR CLEAR 12/13/2020 1245   LABSPEC 1.019 12/13/2020 1245   PHURINE 7.0 12/13/2020 1245   GLUCOSEU NEGATIVE 12/13/2020 1245   HGBUR NEGATIVE 12/13/2020 1245   [place] 12/13/2020 1245   [place] 12/13/2020 1245   PROTEINUR NEGATIVE 12/13/2020 1245   NITRITE NEGATIVE 12/13/2020 1245   LEUKOCYTESUR NEGATIVE 12/13/2020 1245   Sepsis Labs: Invalid input(s): PROCALCITONIN, [place]  Microbiology: Recent Results (from the past 240 hour(s))  Surgical pcr screen     Status: None   Collection Time: 01/24/21  2:56 AM   Specimen: Nasal Mucosa; Nasal Swab  Result Value Ref Range Status   MRSA, PCR NEGATIVE NEGATIVE Final   Staphylococcus aureus NEGATIVE NEGATIVE Final    Comment: (NOTE) The Xpert SA Assay (FDA approved for NASAL specimens in patients 56 years of age and older), is one component of a comprehensive surveillance program. It is not intended to diagnose infection nor to guide or monitor treatment. Performed at [facility], [address] [place], [place] [address]     Radiology Studies: No results found.    Andrell Bergeson T. [place]  If 7PM-7AM, please contact night-coverage [email or web address] 01/31/2021, 1:03  PM

## 2021-02-01 ENCOUNTER — Inpatient Hospital Stay: Payer: Commercial Managed Care - PPO | Admitting: Adult Health

## 2021-02-01 ENCOUNTER — Inpatient Hospital Stay: Payer: Self-pay

## 2021-02-01 ENCOUNTER — Inpatient Hospital Stay (HOSPITAL_COMMUNITY): Payer: Commercial Managed Care - PPO

## 2021-02-01 LAB — CBC
HCT: 29.1 % — ABNORMAL LOW (ref 39.0–52.0)
Hemoglobin: 9.5 g/dL — ABNORMAL LOW (ref 13.0–17.0)
MCH: 26 pg (ref 26.0–34.0)
MCHC: 32.6 g/dL (ref 30.0–36.0)
MCV: 79.5 fL — ABNORMAL LOW (ref 80.0–100.0)
Platelets: 367 10*3/uL (ref 150–400)
RBC: 3.66 MIL/uL — ABNORMAL LOW (ref 4.22–5.81)
RDW: 13.8 % (ref 11.5–15.5)
WBC: 10.5 10*3/uL (ref 4.0–10.5)
nRBC: 0 % (ref 0.0–0.2)

## 2021-02-01 LAB — COMPREHENSIVE METABOLIC PANEL
ALT: 57 U/L — ABNORMAL HIGH (ref 0–44)
AST: 32 U/L (ref 15–41)
Albumin: 3.3 g/dL — ABNORMAL LOW (ref 3.5–5.0)
Alkaline Phosphatase: 130 U/L — ABNORMAL HIGH (ref 38–126)
Anion gap: 11 (ref 5–15)
BUN: 11 mg/dL (ref 8–23)
CO2: 22 mmol/L (ref 22–32)
Calcium: 8.8 mg/dL — ABNORMAL LOW (ref 8.9–10.3)
Chloride: 108 mmol/L (ref 98–111)
Creatinine, Ser: 0.97 mg/dL (ref 0.61–1.24)
GFR, Estimated: >60 mL/min (ref >60)
Glucose, Bld: 116 mg/dL — ABNORMAL HIGH (ref 70–99)
Potassium: 3.8 mmol/L (ref 3.5–5.1)
Sodium: 141 mmol/L (ref 135–145)
Total Bilirubin: 0.7 mg/dL (ref 0.3–1.2)
Total Protein: 7 g/dL (ref 6.5–8.1)

## 2021-02-01 LAB — GLUCOSE, CAPILLARY: Glucose-Capillary: 140 mg/dL — ABNORMAL HIGH (ref 70–99)

## 2021-02-01 LAB — MAGNESIUM: Magnesium: 2.2 mg/dL (ref 1.7–2.4)

## 2021-02-01 LAB — PHOSPHORUS: Phosphorus: 4.2 mg/dL (ref 2.5–4.6)

## 2021-02-01 LAB — PREALBUMIN: Prealbumin: 13.7 mg/dL — ABNORMAL LOW (ref 18–38)

## 2021-02-01 MED ORDER — DEXTROSE-NACL 5-0.45 % IV SOLN: INTRAVENOUS | Status: AC

## 2021-02-01 MED ORDER — LACTATED RINGERS IV BOLUS
1000.0000 mL | Freq: Once | INTRAVENOUS | Status: AC
  Administered 2021-02-01: 1000 mL via INTRAVENOUS

## 2021-02-01 MED ORDER — SODIUM CHLORIDE 0.9% FLUSH
10.0000 mL | Freq: Two times a day (BID) | INTRAVENOUS | Status: DC
  Administered 2021-02-01 – 2021-02-02 (×2): 10 mL

## 2021-02-01 MED ORDER — ASPIRIN 300 MG RE SUPP
150.0000 mg | Freq: Every day | RECTAL | Status: DC
  Administered 2021-02-01 – 2021-02-05 (×5): 150 mg via RECTAL
  Filled 2021-02-01 (×5): qty 1

## 2021-02-01 MED ORDER — TRAVASOL 10 % IV SOLN
INTRAVENOUS | Status: AC
  Filled 2021-02-01: qty 547.2

## 2021-02-01 MED ORDER — INSULIN ASPART 100 UNIT/ML ~~LOC~~ SOLN
0.0000 [IU] | Freq: Four times a day (QID) | SUBCUTANEOUS | Status: DC
  Administered 2021-02-01 – 2021-02-05 (×9): 1 [IU] via SUBCUTANEOUS

## 2021-02-01 MED ORDER — SODIUM CHLORIDE 0.9% FLUSH
10.0000 mL | INTRAVENOUS | Status: DC | PRN
  Administered 2021-02-01: 10 mL

## 2021-02-01 NOTE — Progress Notes (Signed)
PT Cancellation Note  Patient Details Name: Dustin Golden MRN: [id number] DOB: 1955/11/03   Cancelled Treatment:    Reason Eval/Treat Not Completed: Attempted PT tx session-nursing requested PT check back another time. Will check back another day. Thanks.    [facility]  Office: [phone] Pager: [phone]

## 2021-02-01 NOTE — Progress Notes (Signed)
Nutrition Follow-up  DOCUMENTATION CODES:   Not applicable  INTERVENTION:  - TPN initiation and management per Pharmacist. - will monitor for diet advancement.   Monitor magnesium, potassium, and phosphorus daily for at least 3 days, MD to replete as needed, as pt is at risk for refeeding syndrome given insufficient nutrition throughout hospitalization.   NUTRITION DIAGNOSIS:   Increased nutrient needs related to post-op healing as evidenced by estimated needs. -ongoing  GOAL:   Patient will meet greater than or equal to 90% of their needs -unmet/unable to meet at this time  MONITOR:   Diet advancement,Labs,Weight trends,I & O's,Other (Comment) (TPN regimen)  REASON FOR ASSESSMENT:   Consult New TPN/TNA  ASSESSMENT:   [name] with PMH of recent left ACA and MCA CVA s/p left ICA thrombectomy followed by vascular stenting with mild residual upper and lower extremity weakness and dysarthria on DAPT with aspirin and Brilinta presenting with BRBPR.  EGD on 4/6 with mild erythematous mucosa in duodenum.  Colonoscopy on 4/7 with ulcerated cecal mass.  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  Pathology with high-grade dysplasia.  Significant Events: 4/4- admission 4/7- colonoscopy--ulcerated cecal mass found 4/9- diet advanced to dysphagia 1 4/10- diet downgraded to CLD 4/12- NPO at midnight and re-advanced to CLD after lunch; ex lap, LOAs, lap R hemicolectomy 4/14- NGT placed; NPO 4/15- initial RD assessment 4/16- NGT removed; re-advanced to CLD then to Soft at dinner 4/17- NPO 4/18- re-advanced to CLD   Patient was again made NPO this AM and NGT re-inserted; currently has 300 ml dark green output in canister.  Patient laying in bed with no family or visitors present. He reports abdominal pain/pressure and nausea. Also points to his head as if to indicate headache.   He indicates he is aware of  plan for PICC to be placed today and for TPN to start this evening. Patient denies any questions or concerns surrounding this. Surgery note from this AM states concern for ileus despite BMs d/t ongoing abdominal pain, N/V, and abdominal distention.   He has not been weighed since 4/4. No information documented in the edema section of flow sheet.   Notes indicate patient has been dx this admission with invasive colorectal adenocarcinoma.   Labs reviewed; alk phos elevated.  Medications reviewed; sliding scale novolog, 40 mg IV protonix/day. IVF; D5-1/2 NS @ 100 ml/hr (408 kcal/24 hours)    NUTRITION - FOCUSED PHYSICAL EXAM:  completed; no muscle or fat depletions.  Diet Order:   Diet Order            Diet NPO time specified  Diet effective now                 EDUCATION NEEDS:   Not appropriate for education at this time  Skin:  Skin Assessment: Skin Integrity Issues: Skin Integrity Issues:: Incisions Incisions: abdomen (4/12)  Last BM:  4/18 (type 3 x1)  Height:   Ht Readings from Last 1 Encounters:  01/20/21 '6\' 3"'  (1.905 m)    Weight:   Wt Readings from Last 1 Encounters:  01/20/21 87.2 kg     Estimated Nutritional Needs:  Kcal:  2200-2400 Protein:  110-125g Fluid:  2.2L/day     Jarome Matin, MS, RD, LDN, CNSC Inpatient Clinical Dietitian RD pager # available in AMION  After hours/weekend pager # available in [facility]

## 2021-02-01 NOTE — Progress Notes (Deleted)
[facility] [address] [place]. [place] [address] [phone]       HOSPITAL FOLLOW UP NOTE  [name] Date of Birth:  1956/05/17 Medical Record Number:  [id number]   Reason for Referral:  hospital stroke follow up    SUBJECTIVE:   CHIEF COMPLAINT:  No chief complaint on file.   HPI:   Dustin Golden a 65 y.o.malewith history of tobacco abuse, alcohol use, and drug use who presented to ED on 12/05/2020  with aphasia and right sided hemiparesis with initial [id number]. Personally reviewed as physician pertinent progress notes, lab work and imaging with summary provided.  Evaluated by Dr. Leonie Man with stroke work-up revealing left ICA and left MCA/M2 stroke s/p IR with TICI 3 reperfusion likely secondary to arthrosclerosis vs cardioembolic. Delay angiogram showed near reocclusion. A carotid stent was deployed across the bifurcation with use of cerebral protection device followed by in stent angioplasty.   2D echo showed EF 30 to 40% with global hypokinesis, LVH and grade 1 diastolic dysfunction.  Placed on aspirin and Brilinta.  Recommended 30-day cardiac event monitor outpatient to further evaluate for arrhythmias.  Elevated BP requiring Cleviprex and initiated amlodipine with long-term BP goal normotensive range.  LDL 109 and recommend initiation on simvastatin.  Pre-DM with A1c 6.8.  Other stroke risk factors include current tobacco use, EtOH use (placed on folic acid, thiamine and MV1), polysubstance abuse (THC and cocaine) and CHF.  Residual deficits of dysphagia, expressive aphasia, mild dysarthria, right lower facial weakness and mild right hemiparesis.  Evaluated by therapies and discharged to CIR for ongoing therapy needs.    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 ASPECTS 10.   CTA head &neck occlusion of the left ICA  CT perfusion:16 cc completed  infarction in the left frontoparietal junction region. Additional 42 cc at risk brain  surrounding that region.  MRI Acute infarct left posterior MCA distribution involving the posterior insula in the left frontal parietal cortex. Small areas of  acute infarct in the frontal lobes bilaterally.  MRA There is moderate stenosis in the middle cerebral artery M1 segment bilaterally. Left internal carotid artery appears widely  patent.  2D Echo: EF: 30-40%, left ventricle  demonstrates global hypokinesis, LVH, Grade 1 diastolic dysfunction  [id number]  HgbA1c6.8  VTE prophylaxis - SCDs only for now  Took occasional aspirin for pain prior to admission, was on Cangrelor x24 hourss/p thrombectomy Now onaspirin 81mg and Brilinta 90mg  BID  Therapy recommendations: CIR  Disposition:  CIR, Insurance approved, PM&R accepted patient and he was transferred to CIR on 3/4.   Today, 02/01/2021, Dustin Golden is being seen for hospital follow up            ROS:   14 system review of systems performed and negative with exception of ***  PMH:  Past Medical History:  Diagnosis Date  . Hypertension   . Stroke [facility])     PSH:  Past Surgical History:  Procedure Laterality Date  . APPENDECTOMY    . BIOPSY  01/20/2021   Procedure: BIOPSY;  Surgeon: Arta Silence, MD;  Location: [facility];  Service: Endoscopy;;  . COLONOSCOPY WITH PROPOFOL Left 01/20/2021   Procedure: COLONOSCOPY WITH PROPOFOL;  Surgeon: Arta Silence, MD;  Location: [facility];  Service: Endoscopy;  Laterality: Left;  . ESOPHAGOGASTRODUODENOSCOPY N/A 01/19/2021   Procedure: ESOPHAGOGASTRODUODENOSCOPY (EGD);  Surgeon: Arta Silence, MD;  Location: [facility];  Service: Endoscopy;  Laterality: N/A;  . gastric ulcer surgery    . IR CT HEAD LTD  12/13/2020  . IR INTRAVSC STENT CERV CAROTID W/EMB-PROT MOD SED INCL ANGIO  12/14/2020  . IR PERCUTANEOUS ART THROMBECTOMY/INFUSION INTRACRANIAL INC DIAG ANGIO  12/13/2020  .  IR US GUIDE VASC ACCESS RIGHT  12/13/2020  . LAPAROSCOPIC RIGHT HEMI COLECTOMY N/A 01/25/2021   Procedure: LAPAROSCOPIC RIGHT COLECTOMY;  Surgeon: Ralene Ok, MD;  Location: [facility];  Service: General;  Laterality: N/A;  . RADIOLOGY WITH ANESTHESIA N/A 12/13/2020   Procedure: IR WITH ANESTHESIA;  Surgeon: Luanne Bras, MD;  Location: [facility];  Service: Radiology;  Laterality: N/A;    Social History:  Social History   Socioeconomic History  . Marital status: Unknown    Spouse name: Not on file  . Number of children: Not on file  . Years of education: Not on file  . Highest education level: Not on file  Occupational History  . Not on file  Tobacco Use  . Smoking status: Former Smoker    Packs/day: 2.00    Years: 48.00    Pack years: 96.00    Quit date: 12/13/2020    Years since quitting: 0.1  . Smokeless tobacco: Never Used  Vaping Use  . Vaping Use: Never used  Substance and Sexual Activity  . Alcohol use: Yes    Comment: weekly  . Drug use: Yes    Types: Marijuana  . Sexual activity: Yes  Other Topics Concern  . Not on file  Social History Narrative  . Not on file   Social Determinants of Health   Financial Resource Strain: Not on file  Food Insecurity: Not on file  Transportation Needs: Not on file  Physical Activity: Not on file  Stress: Not on file  Social Connections: Not on file  Intimate Partner Violence: Not on file    Family History:  Family History  Problem Relation Age of Onset  . Hypertension Mother   . Hypertension Father     Medications:   Current Facility-Administered Medications on File Prior to Visit  Medication Dose Route Frequency Provider Last Rate Last Admin  . acetaminophen (TYLENOL) tablet 650 mg  650 mg Oral Q6H PRN Blount, Scarlette Shorts T, NP      . alum & mag hydroxide-simeth (MAALOX/MYLANTA) 200-200-20 MG/5ML suspension 30 mL  30 mL Oral Q6H PRN Michael Boston, MD   30 mL at 01/30/21 0102  . aspirin EC tablet 81 mg  81 mg Oral Daily  Wendee Beavers T, MD   81 mg at 01/31/21 0955  . atorvastatin (LIPITOR) tablet 20 mg  20 mg Oral Daily Gonfa, Taye T, MD   20 mg at 01/31/21 0955  . bupivacaine liposome (EXPAREL) 1.3 % injection 266 mg  20 mL Infiltration Once Jill Alexanders, PA-C      . Chlorhexidine Gluconate Cloth 2 % PADS 6 each  6 each Topical Daily Mercy Riding, MD   6 each at 01/31/21 0956  . chlorproMAZINE (THORAZINE) 25 mg in sodium chloride 0.9 % 25 mL IVPB  25 mg Intravenous Q6H PRN Michael Boston, MD 50 mL/hr at 02/01/21 0325 25 mg at 02/01/21 0325  . labetalol (NORMODYNE) injection 10 mg  10 mg Intravenous Q2H PRN Gonfa, Taye T, MD      . lidocaine (LIDODERM) 5 % 1 patch  1 patch Transdermal Q24H Jill Alexanders, PA-C   1 patch at 01/31/21 1609  . lip balm (CARMEX)  ointment 1 application  1 application Topical BID Michael Boston, MD   1 application at 38/18/29 0955  . magic mouthwash  15 mL Oral QID PRN Michael Boston, MD      . menthol-cetylpyridinium (CEPACOL) lozenge 3 mg  1 lozenge Oral PRN Michael Boston, MD      . methocarbamol (ROBAXIN) 500 mg in dextrose 5 % 50 mL IVPB  500 mg Intravenous Q6H Jill Alexanders, PA-C 100 mL/hr at 02/01/21 0619 500 mg at 02/01/21 0619  . metoprolol tartrate (LOPRESSOR) injection 2.5 mg  2.5 mg Intravenous Q6H Wendee Beavers T, MD   2.5 mg at 02/01/21 0619  . morphine 4 MG/ML injection 2-4 mg  2-4 mg Intravenous Q3H PRN Jill Alexanders, PA-C   4 mg at 01/31/21 0248  . ondansetron (ZOFRAN) injection 4 mg  4 mg Intravenous Q6H Wendee Beavers T, MD   4 mg at 02/01/21 0616  . pantoprazole (PROTONIX) injection 40 mg  40 mg Intravenous Q24H Wendee Beavers T, MD   40 mg at 01/31/21 2106  . phenol (CHLORASEPTIC) mouth spray 2 spray  2 spray Mouth/Throat PRN Michael Boston, MD      . ticagrelor [facility]) tablet 90 mg  90 mg Oral BID Mercy Riding, MD   90 mg at 01/31/21 2106   Current Outpatient Medications on File Prior to Visit  Medication Sig Dispense Refill  . acetaminophen  (TYLENOL) 325 MG tablet Take 2 tablets (650 mg total) by mouth every 4 (four) hours as needed for mild pain (or temp > 37.5 C (99.5 F)).    Marland Kitchen amLODipine (NORVASC) 10 MG tablet Take 1 tablet (10 mg total) by mouth daily. 30 tablet 0  . aspirin 81 MG chewable tablet Chew 1 tablet (81 mg total) by mouth daily.    Marland Kitchen atorvastatin (LIPITOR) 20 MG tablet Take 1 tablet (20 mg total) by mouth daily. 30 tablet 0  . folic acid (FOLVITE) 1 MG tablet Take 1 tablet (1 mg total) by mouth daily. 30 tablet 0  . lisinopril (ZESTRIL) 2.5 MG tablet Take 1 tablet (2.5 mg total) by mouth daily. 30 tablet 0  . ticagrelor (BRILINTA) 90 MG TABS tablet Take 1 tablet (90 mg total) by mouth 2 (two) times daily. 60 tablet 0    Allergies:   Allergies  Allergen Reactions  . Ibuprofen Other (See Comments)    Nosebleeds   . Sulfa Antibiotics Rash and Other (See Comments)    "Blisters to skin with cream"  . Sulfamethoxazole Rash and Other (See Comments)    "Blisters to skin with cream"      OBJECTIVE:  Physical Exam  There were no vitals filed for this visit. There is no height or weight on file to calculate BMI. No exam data present  Depression screen [facility] 2/9 01/05/2021  Decreased Interest 0  Down, Depressed, Hopeless 0  PHQ - 2 Score 0     General: well developed, well nourished, seated, in no evident distress Head: head normocephalic and atraumatic.   Neck: supple with no carotid or supraclavicular bruits Cardiovascular: regular rate and rhythm, no murmurs Musculoskeletal: no deformity Skin:  no rash/petichiae Vascular:  Normal pulses all extremities   Neurologic Exam Mental Status: Awake and fully alert. Oriented to place and time. Recent and remote memory intact. Attention span, concentration and fund of knowledge appropriate. Mood and affect appropriate.  Cranial Nerves: Fundoscopic exam reveals sharp disc margins. Pupils equal, briskly reactive to light. Extraocular movements full without  nystagmus. Visual fields full to confrontation. Hearing intact. Facial sensation intact. Face, tongue, palate moves normally and symmetrically.  Motor: Normal bulk and tone. Normal strength in all tested extremity muscles Sensory.: intact to touch , pinprick , position and vibratory sensation.  Coordination: Rapid alternating movements normal in all extremities. Finger-to-nose and heel-to-shin performed accurately bilaterally. Gait and Station: Arises from chair without difficulty. Stance is normal. Gait demonstrates normal stride length and balance with ***. Tandem walk and heel toe ***.  Reflexes: 1+ and symmetric. Toes downgoing.     NIHSS  *** Modified Rankin  ***      ASSESSMENT: Dustin Golden is a 65 y.o. year old male presented with aphasia and right hemiparesis on 12/05/2020 with stroke work-up revealing left ICA and left MCA/M2 stroke s/p IR with TICI 3 reperfusion and reocclusion requiring stent angioplasty likely secondary to arthrosclerosis versus cardioembolic. Vascular risk factors include HTN, HLD, tobacco use, EtOH use, THC and cocaine use.      PLAN:  1. L ICA and L MCA/M2 stroke : Residual deficit: ***. Continue aspirin 81 mg daily and Brilinta (ticagrelor) 90 mg bid  and simvastatin  for secondary stroke prevention.  Discussed secondary stroke prevention measures and importance of close PCP follow up for aggressive stroke risk factor management  2. HTN: BP goal <130/90.  Stable on *** per PCP 3. HLD: LDL goal <70. Recent LDL 109 - started simvastatin.  4. Pre-DMII: A1c goal<7.0. Recent A1c 6.8.  5. Polysubstance abuse:     Follow up in *** or call earlier if needed   CC:  [facility] provider: Dr. Leonie Man PCP: Sabra Heck, [place], [place]    I spent *** minutes of face-to-face and non-face-to-face time with patient.  This included previsit chart review, lab review, study review, order entry, electronic health record documentation, patient education regarding recent stroke,  residual deficits, importance of managing stroke risk factors and answered all questions to patient satisfaction     Frann Rider, [facility]  [facility] [address] [place], [place] [address]  Phone [phone] Fax [phone] Note: This document was prepared with digital dictation and possible smart phrase technology. Any transcriptional errors that result from this process are unintentional.

## 2021-02-01 NOTE — Plan of Care (Signed)
  Problem: Education: Goal: Knowledge of General Education information will improve Description: Including pain rating scale, medication(s)/side effects and non-pharmacologic comfort measures Outcome: Progressing   Problem: Nutrition: Goal: Adequate nutrition will be maintained Outcome: Progressing   

## 2021-02-01 NOTE — Progress Notes (Signed)
Consent obtained from patient with daughter being given information via telephone.  Patient and daughter had questions answered.

## 2021-02-01 NOTE — Progress Notes (Addendum)
PHARMACY - TOTAL PARENTERAL NUTRITION CONSULT NOTE   Indication: Prolonged ileus  Patient Measurements: Height: '6\' 3"'  (190.5 cm) Weight: 87.2 kg (192 lb 3.9 oz) IBW/kg (Calculated) : 84.5 TPN AdjBW (KG): 87.2 Body mass index is 24.03 kg/m.  Assessment: 86 y/oM with PMH of recent left ACA and MCA CVA s/p left ICA thrombectomy followed by vascular stenting on DAPT presented 4/4 with BRBPR. Colonoscopy on 4/7 with ulcerated cecal mass. 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 Patient underwent exploratory laparotomy, open right hemicolectomy on 4/12. Postop course complicated by ileus. Pharmacy consulted for TPN.   Glucose / Insulin: no hx of DM. CBG 116 on CMET. No insulin ordered/required.  Electrolytes: all WNL. Noted Phos elevated earlier this admission.  Renal: SCr 0.87, BUN 11 Hepatic: AST WNL. ALT mildly elevated/increased at 57. Alk Phos mildly elevated/increased to 130. Tbili WNL.  Triglycerides: ordered for AM Albumin/Prealbumin: Albumin 3.3, Prealbumin 22.2 (4/12), 13.7 (4/19) Intake / Output; MIVF: 1521m UOP, 8072memesis; I/O -83045mD5 1/2 NS at 100m65m GI Imaging: 4/17 CT abd/pelvis: Changes consistent with postoperative ileus with a transition zone in the mid ileum. The stomach is well distended with fluid and the postoperative ileus extends throughout the proximal small bowel. GI Surgeries / Procedures: 4/12 exploratory laparotomy, open right hemicolectomy for partially obstructing tumor in the cecum  Central access: PICC ordered 4/19 TPN start date: 4/19  Nutritional Goals (per RD recommendation on 4/15): kCal: 2200-2400, Protein: 110-125g, Fluid: 2.2L/day  Goal TPN rate is 90mL36m(provides 123g of protein and 2264 kcals per day)  Current Nutrition:  NPO  Plan:  At 1800:  Start TPN at 40mL/52mlectrolytes in TPN: Na 50mEq/36m 50mEq/L71m 5mEq/L, 72m5mEq/L, a32mPhos 10mmol/L. 39mc 1:1 Add standard MVI and  trace elements to TPN Decrease maintenance IV fluids to 60mL/hr (di33msed with Surgery) Initiate Sensitive q6h SSI and adjust as needed  Monitor TPN labs on Mon/Thurs CMET, Mag, Phos, Triglycerides in AM.    Dustin Mcwilliams GadhiaLindell SparPS Clinical Pharmacist  02/01/2021,8:13 AM

## 2021-02-01 NOTE — Progress Notes (Addendum)
PROGRESS NOTE  Dustin Golden QQI:[id number] DOB: [birth year]   PCP: [name], PA  Patient is from: Home  DOA: 01/17/2021 LOS: 48  Chief complaints: Rectal bleed  Brief Narrative / Interim history: [name] with PMH of recent left ACA and MCA CVA s/p left ICA thrombectomy followed by vascular stenting with mild residual upper and lower extremity weakness and dysarthria on DAPT with aspirin and Brilinta presenting with BRBPR.  EGD on 4/6 with mild erythematous mucosa in duodenum.  Colonoscopy on 4/7 with ulcerated cecal mass.  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  Pathology with high-grade dysplasia.   Patient underwent laparotomy with right hemicolectomy on 01/25/2021.  Surgical pathology with stage II adenocarcinoma.  Postop course complicated by protracted ileus requiring NGT.  Now on TPN.   Subjective: Seen and examined earlier this morning.  Patient was started on clear liquid diet yesterday.  He had a large volume emesis.  He also reports loose bowel movement this morning.  He continues to have hiccups.  Denies abdominal pain, chest pain or dyspnea.  Objective: Vitals:   01/31/21 0511 01/31/21 1343 01/31/21 2049 02/01/21 0620  BP: 109/78 112/76 125/77 (!) 124/93  Pulse: (!) 101 93 85 87  Resp: 18 18 18 18   Temp: 98 F (36.7 C) (!) 97.5 F (36.4 C) 98.2 F (36.8 C) 97.8 F (36.6 C)  TempSrc: Oral Oral Oral Oral  SpO2: 100% 96% 99% 99%  Weight:      Height:        Intake/Output Summary (Last 24 hours) at 02/01/2021 1057 Last data filed at 02/01/2021 1000 Gross per 24 hour  Intake 1080 ml  Output 3300 ml  Net -2220 ml   Filed Weights   01/17/21 1352 01/20/21 1135  Weight: 87.2 kg 87.2 kg    Examination:  GENERAL: No apparent distress.  Nontoxic. HEENT: MMM.  Vision and hearing grossly intact.  NECK: Supple.  No apparent JVD.  RESP: On RA.  No IWOB.  Fair aeration  bilaterally. CVS:  RRR. Heart sounds normal.  ABD/GI/GU: Diminished BS.  Slightly distended.  Surgical wound DCI.  Staples in place. MSK/EXT:  Moves extremities. No apparent deformity. No edema.  SKIN: no apparent skin lesion or wound NEURO: Awake, alert and oriented appropriately.  Residual dysarthria. PSYCH: Calm. Normal affect.  Procedures:  4/6-EGD with erythematous mucosa and duodenum 4/7-colonoscopy with fungating mass in cecum.  Pathology with high-grade dysplasia. 4/12-laparotomy and right hemicolectomy.  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 Nausea/vomiting/hiccups-likely due to postop ileus. -General surgery recommendations  -N.p.o., NGT, PICC line and TPN  -Mobilize  -IS/pulm toilet -Continue scheduled Zofran with as needed Compazine  Hematochezia: Likely due to the above.  H&H relatively stable stable. Recent Labs    01/23/21 0448 01/24/21 0430 01/25/21 0351 01/26/21 0435 01/27/21 0420 01/28/21 0409 01/29/21 0420 01/30/21 0539 01/31/21 0419 02/01/21 0032  HGB 10.1* 10.5* 10.5* 10.7* 12.3* 10.1* 9.6* 10.2* 9.2* 9.5*  -Monitor H&H  Recent left ACA and MCA/M2 stroke s/p left ICA mechanical thrombectomy followed by rescue stenting with mid residual upper and lower extremity weakness and dysarthria: Stable. -Okay to continue aspirin, Brilinta and statin per general surgery although poor observation with NG tube  Addendum Discussed with neurology, Dr. [name]  about DAPT. He discussed with interventional radiologist.  Since patient is over 6 weeks out after stent placement, it is okay to de-escalate to aspirin 81 mg daily.  Since patient has NG tube, we are doing aspirin suppository 150 mg daily until he is able to take  p.o.  AKI/azotemia: Likely prerenal from poor p.o. intake after surgery.  Resolved. Recent Labs    12/20/20 0439 12/21/20 0545 12/27/20 0442 01/17/21 1444 01/18/21 0506 01/25/21 0351 01/26/21 0435 01/28/21 0409 01/29/21 0420 02/01/21 0032  BUN 14 13 10 13 12  7* 13 32* 14 11  CREATININE 1.31* 1.11 1.09 1.01 1.24 0.98 1.21 1.68* 1.08 0.97  -Avoid nephrotoxic meds  Essential hypertension: BP elevated. -Scheduled IV metoprolol with as needed labetalol  Hyponatremia: Resolved.  Leukocytosis: Likely demargination.  Resolved. -Continue monitoring  Hyperlipidemia: Stable -Continue home Lipitor  Increased nutrition need Body mass index is 24.03 kg/m. Nutrition Problem: Increased nutrient needs Etiology: post-op healing Signs/Symptoms: estimated needs Interventions: TPN   DVT prophylaxis:  SCD's Start: 01/22/21 0818 SCDs Start: 01/17/21 2046  Code Status: Full code Family Communication: Updated patient's daughter at the bedside. Level of care: Med-Surg Status is: Inpatient  Remains inpatient appropriate because:IV treatments appropriate due to intensity of illness or inability to take PO and Inpatient level of care appropriate due to severity of illness/postop ileus after ex lap and right colectomy   Dispo: The patient is from: Home              Anticipated d/c is to: Home              Patient currently is not medically stable to d/c.   Difficult to place patient No       Consultants:  Gastroenterology-signed off Neurology over the phone General surgery   Sch Meds:  Scheduled Meds: . aspirin EC  81 mg Oral Daily  . atorvastatin  20 mg Oral Daily  . bupivacaine liposome  20 mL Infiltration Once  . Chlorhexidine Gluconate Cloth  6 each Topical Daily  . insulin aspart  0-9 Units Subcutaneous Q6H  . lidocaine  1 patch Transdermal Q24H  . lip balm  1 application Topical BID  . metoprolol tartrate  2.5 mg Intravenous Q6H  . ondansetron (ZOFRAN) IV  4 mg  Intravenous Q6H  . pantoprazole (PROTONIX) IV  40 mg Intravenous Q24H  . ticagrelor  90 mg Oral BID   Continuous Infusions: . chlorproMAZINE (THORAZINE) IV 25 mg (02/01/21 0325)  . dextrose 5 % and 0.45% NaCl 100 mL/hr at 02/01/21 1037  . methocarbamol (ROBAXIN) IV 500 mg (02/01/21 0619)   PRN Meds:.acetaminophen, chlorproMAZINE (THORAZINE) IV, labetalol, magic mouthwash, menthol-cetylpyridinium, morphine injection, phenol  Antimicrobials: Anti-infectives (From admission, onward)   Start     Dose/Rate Route Frequency Ordered Stop   01/25/21 0600  cefoTEtan (CEFOTAN) 2 g in sodium chloride 0.9 % 100 mL IVPB        2 g 200 mL/hr over 30 Minutes Intravenous On call to O.R. 01/24/21 1405 01/25/21 1110   01/24/21 0600  cefoTEtan (CEFOTAN) 2 g in sodium chloride 0.9 % 100 mL IVPB  Status:  Discontinued        2 g 200 mL/hr over 30 Minutes Intravenous On call to O.R. 01/22/21 0821 01/24/21 1406   01/23/21 1400  neomycin (MYCIFRADIN) tablet 1,000 mg       "And" Linked Group Details   1,000 mg Oral 3 times per day 01/22/21 0821 01/23/21 2233   01/23/21 1400  metroNIDAZOLE (  FLAGYL) tablet 1,000 mg       "And" Linked Group Details   1,000 mg Oral 3 times per day 01/22/21 0821 01/23/21 2233       I have personally reviewed the following labs and images: CBC: Recent Labs  Lab 01/28/21 0409 01/29/21 0420 01/30/21 0539 01/31/21 0419 02/01/21 0032  WBC 11.2* 9.4 13.2* 12.1* 10.5  HGB 10.1* 9.6* 10.2* 9.2* 9.5*  HCT 30.4* 29.9* 31.9* 27.9* 29.1*  MCV 77.7* 80.6 80.4 79.5* 79.5*  PLT 322 342 403* 334 367   BMP &GFR Recent Labs  Lab 01/26/21 0435 01/28/21 0409 01/29/21 0420 02/01/21 0032  NA 134* 139 143 141  K 4.2 3.8 3.7 3.8  CL 100 104 109 108  CO2 23 25 25 22   GLUCOSE 164* 148* 128* 116*  BUN 13 32* 14 11  CREATININE 1.21 1.68* 1.08 0.97  CALCIUM 9.0 9.1 8.8* 8.8*  MG 1.9 2.1 2.3 2.2  PHOS 4.7* 4.9* 3.4 4.2   Estimated Creatinine Clearance: 92 mL/min (by C-G formula  based on SCr of 0.97 mg/dL). Liver & Pancreas: Recent Labs  Lab 01/26/21 0435 01/28/21 0409 01/29/21 0420 02/01/21 0032  AST  --   --  17 32  ALT  --   --  14 57*  ALKPHOS  --   --  72 130*  BILITOT  --   --  0.4 0.7  PROT  --   --  7.0 7.0  ALBUMIN 3.8 3.5 3.5 3.3*   No results for input(s): LIPASE, AMYLASE in the last 168 hours. No results for input(s): AMMONIA in the last 168 hours. Diabetic: No results for input(s): HGBA1C in the last 72 hours. No results for input(s): GLUCAP in the last 168 hours. Cardiac Enzymes: No results for input(s): CKTOTAL, CKMB, CKMBINDEX, TROPONINI in the last 168 hours. No results for input(s): PROBNP in the last 8760 hours. Coagulation Profile: No results for input(s): INR, PROTIME in the last 168 hours. Thyroid Function Tests: No results for input(s): TSH, T4TOTAL, FREET4, T3FREE, THYROIDAB in the last 72 hours. Lipid Profile: No results for input(s): CHOL, HDL, LDLCALC, TRIG, CHOLHDL, LDLDIRECT in the last 72 hours. Anemia Panel: No results for input(s): VITAMINB12, FOLATE, FERRITIN, TIBC, IRON, RETICCTPCT in the last 72 hours. Urine analysis:    Component Value Date/Time   COLORURINE STRAW (A) 12/13/2020 1245   APPEARANCEUR CLEAR 12/13/2020 1245   LABSPEC 1.019 12/13/2020 1245   PHURINE 7.0 12/13/2020 1245   GLUCOSEU NEGATIVE 12/13/2020 1245   HGBUR NEGATIVE 12/13/2020 1245   [place] 12/13/2020 1245   [place] 12/13/2020 1245   PROTEINUR NEGATIVE 12/13/2020 1245   NITRITE NEGATIVE 12/13/2020 1245   LEUKOCYTESUR NEGATIVE 12/13/2020 1245   Sepsis Labs: Invalid input(s): PROCALCITONIN, [place]  Microbiology: Recent Results (from the past 240 hour(s))  Surgical pcr screen     Status: None   Collection Time: 01/24/21  2:56 AM   Specimen: Nasal Mucosa; Nasal Swab  Result Value Ref Range Status   MRSA, PCR NEGATIVE NEGATIVE Final   Staphylococcus aureus NEGATIVE NEGATIVE Final    Comment: (NOTE) The  Xpert SA Assay (FDA approved for NASAL specimens in patients 26 years of age and older), is one component of a comprehensive surveillance program. It is not intended to diagnose infection nor to guide or monitor treatment. Performed at [facility], [place] [address] [place], [place] [address]     Radiology Studies: [place] EKG SITE RITE  Result Date: 02/01/2021 If [facility]  image not attached, placement could not be confirmed due to current cardiac rhythm.     Jaeshaun Riva T. [place]  If 7PM-7AM, please contact night-coverage [email or web address] 02/01/2021, 10:57 AM

## 2021-02-01 NOTE — Progress Notes (Signed)
SLP Cancellation Note  Patient Details Name: Dustin Golden MRN: [id number] DOB: 1956/05/30   Cancelled treatment:       Reason Eval/Treat Not Completed: Other (comment);Medical issues which prohibited therapy (pt now npo - plan for ng, will continue efforts)  Kathleen Lime, MS [facility] SLP Acute Rehab Services Office [phone] Pager [phone]   [name] 02/01/2021, 10:06 AM

## 2021-02-01 NOTE — Progress Notes (Signed)
Peripherally Inserted Central Catheter Placement  The IV Nurse has discussed with the patient and/or persons authorized to consent for the patient, the purpose of this procedure and the potential benefits and risks involved with this procedure.  The benefits include less needle sticks, lab draws from the catheter, and the patient may be discharged home with the catheter. Risks include, but not limited to, infection, bleeding, blood clot (thrombus formation), and puncture of an artery; nerve damage and irregular heartbeat and possibility to perform a PICC exchange if needed/ordered by physician.  Alternatives to this procedure were also discussed.  Bard Power PICC patient education guide, fact sheet on infection prevention and patient information card has been provided to patient /or left at bedside.    PICC Placement Documentation  PICC Double Lumen 02/01/21 PICC Left Brachial 44 cm 0 cm (Active)  Indication for Insertion or Continuance of Line Administration of hyperosmolar/irritating solutions (i.e. TPN, Vancomycin, etc.) 02/01/21 1419  Exposed Catheter (cm) 0 cm 02/01/21 1419  Site Assessment Clean;Dry;Intact 02/01/21 1419  Lumen #1 Status Flushed;Saline locked;Blood return noted 02/01/21 1419  Lumen #2 Status Flushed;Saline locked;Blood return noted 02/01/21 1419  Dressing Type Transparent;Securing device 02/01/21 1419  Dressing Status Clean;Dry;Intact 02/01/21 1419  Antimicrobial disc in place? Yes 02/01/21 1419  Dressing Intervention New dressing 02/01/21 1419  Dressing Change Due 02/08/21 02/01/21 [place], [place] 02/01/2021, 2:20 PM

## 2021-02-01 NOTE — Progress Notes (Signed)
7 Days Post-Op   Subjective/Chief Complaint: Patient took in clear liquids yesterday but had large-volume emesis overnight. Having hiccups and nausea this morning. Did have a BM.  Objective: Vital signs in last 24 hours: Temp:  [97.5 F (36.4 C)-98.2 F (36.8 C)] 97.8 F (36.6 C) (04/19 0620) Pulse Rate:  [85-93] 87 (04/19 0620) Resp:  [18] 18 (04/19 0620) BP: (112-125)/(76-93) 124/93 (04/19 0620) SpO2:  [96 %-99 %] 99 % (04/19 0620) Last BM Date: 01/31/21  Intake/Output from previous day: 04/18 0701 - 04/19 0700 In: 5035 [P.O.:720; I.V.:450; IV Piggyback:300] Out: 2300 [Urine:1500; Emesis/NG output:800] Intake/Output this shift: Total I/O In: -  Out: 1000 [Emesis/NG output:1000]  General appearance: resting in bed, appears uncomfortable Resp: normal work of breathing GI: distended, incisions clean and dry  Lab Results:   Recent Labs    01/31/21 0419 02/01/21 0032  WBC 12.1* 10.5  HGB 9.2* 9.5*  HCT 27.9* 29.1*  PLT 334 367   BMET Recent Labs    02/01/21 0032  NA 141  K 3.8  CL 108  CO2 22  GLUCOSE 116*  BUN 11  CREATININE 0.97  CALCIUM 8.8*   PT/INR No results for input(s): LABPROT, INR in the last 72 hours. ABG No results for input(s): PHART, HCO3 in the last 72 hours.  Invalid input(s): PCO2, PO2  Studies/Results: CT ABDOMEN PELVIS W CONTRAST  Result Date: 01/30/2021 CLINICAL DATA:  Status post right colectomy EXAM: CT ABDOMEN AND PELVIS WITH CONTRAST TECHNIQUE: Multidetector CT imaging of the abdomen and pelvis was performed using the standard protocol following bolus administration of intravenous contrast. CONTRAST:  184mL OMNIPAQUE IOHEXOL 300 MG/ML  SOLN COMPARISON:  01/20/2021 FINDINGS: Lower chest: Mild bibasilar atelectasis is noted. Hepatobiliary: No focal liver abnormality is seen. No gallstones, gallbladder wall thickening, or biliary dilatation. Pancreas: Unremarkable. No pancreatic ductal dilatation or surrounding inflammatory changes.  Spleen: Normal in size without focal abnormality. Adrenals/Urinary Tract: The adrenal glands are within normal limits. Kidneys demonstrate a normal enhancement pattern bilaterally. No renal calculi are seen. Delayed images demonstrate normal excretion bilaterally. The bladder is decompressed by Foley catheter. Stomach/Bowel: Stomach is well distended with fluid. Gastric catheter has been removed in the interval. Small-bowel dilatation is noted from the level of the duodenal bulb to at least the mid ileum. The transition point is best seen in the right lower quadrant but several loops prior to the new small bowel colon anastomosis. These changes are consistent with a postoperative ileus. The colon more distally is within normal limits. Changes consistent with the prior partial colectomy and reanastomosis are noted. Vascular/Lymphatic: Aortic atherosclerosis. No enlarged abdominal or pelvic lymph nodes. Reproductive: Prostate is unremarkable. Other: No abdominal wall hernia or abnormality. No abdominopelvic ascites. Musculoskeletal: No acute or significant osseous findings. IMPRESSION: Changes consistent with postoperative ileus with a transition zone in the mid ileum. The stomach is well distended with fluid and the postoperative ileus extends throughout the proximal small bowel. Postsurgical changes in the right colon consistent with the recent history. Electronically Signed   By: Inez Catalina M.D.   On: 01/30/2021 15:53   [place] EKG SITE RITE  Result Date: 02/01/2021 If Site Rite image not attached, placement could not be confirmed due to current cardiac rhythm.   Assessment/Plan: s/p Procedure(s): LAPAROSCOPIC RIGHT COLECTOMY (N/A)   Recentleft ICA and left MCA/M2 infarctions/p mechanical thrombectomy and left carotid stenting and angioplasty 12/13/20 on aspirin and [place] to continue aspirin and Brilinta from a surgical standpoint however given ileus,  patient may not adequately absorb  it.  POD5, s/p lap converted to open R hemicolectomy for partially obstructing tumor in the cecum, Dr. [name] 01/25/21 -surgical path: Invasive colorectal adenocarcinoma, 4.4 cm, negative margins, 0/28 nodes. - Patient is having bowel movements but persistent distension and vomiting. He likely has ongoing ileus. Make NPO and replace NG tube this morning. - Place PICC today and begin TPN   LOS: 13 days   Michaelle Birks, MD [facility] Surgery General, Hepatobiliary and Pancreatic Surgery 02/01/21 9:18 AM

## 2021-02-02 LAB — COMPREHENSIVE METABOLIC PANEL
ALT: 39 U/L (ref 0–44)
AST: 17 U/L (ref 15–41)
Albumin: 3.2 g/dL — ABNORMAL LOW (ref 3.5–5.0)
Alkaline Phosphatase: 119 U/L (ref 38–126)
Anion gap: 10 (ref 5–15)
BUN: 12 mg/dL (ref 8–23)
CO2: 27 mmol/L (ref 22–32)
Calcium: 8.6 mg/dL — ABNORMAL LOW (ref 8.9–10.3)
Chloride: 107 mmol/L (ref 98–111)
Creatinine, Ser: 0.96 mg/dL (ref 0.61–1.24)
GFR, Estimated: >60 mL/min (ref >60)
Glucose, Bld: 127 mg/dL — ABNORMAL HIGH (ref 70–99)
Potassium: 3.1 mmol/L — ABNORMAL LOW (ref 3.5–5.1)
Sodium: 144 mmol/L (ref 135–145)
Total Bilirubin: 0.4 mg/dL (ref 0.3–1.2)
Total Protein: 6.7 g/dL (ref 6.5–8.1)

## 2021-02-02 LAB — CBC
HCT: 27.9 % — ABNORMAL LOW (ref 39.0–52.0)
Hemoglobin: 9.3 g/dL — ABNORMAL LOW (ref 13.0–17.0)
MCH: 26.3 pg (ref 26.0–34.0)
MCHC: 33.3 g/dL (ref 30.0–36.0)
MCV: 79 fL — ABNORMAL LOW (ref 80.0–100.0)
Platelets: 386 10*3/uL (ref 150–400)
RBC: 3.53 MIL/uL — ABNORMAL LOW (ref 4.22–5.81)
RDW: 13.7 % (ref 11.5–15.5)
WBC: 10.6 10*3/uL — ABNORMAL HIGH (ref 4.0–10.5)
nRBC: 0 % (ref 0.0–0.2)

## 2021-02-02 LAB — GLUCOSE, CAPILLARY
Glucose-Capillary: 108 mg/dL — ABNORMAL HIGH (ref 70–99)
Glucose-Capillary: 110 mg/dL — ABNORMAL HIGH (ref 70–99)
Glucose-Capillary: 127 mg/dL — ABNORMAL HIGH (ref 70–99)
Glucose-Capillary: 131 mg/dL — ABNORMAL HIGH (ref 70–99)

## 2021-02-02 LAB — DIFFERENTIAL
Abs Immature Granulocytes: 0.06 10*3/uL (ref 0.00–0.07)
Basophils Absolute: 0 10*3/uL (ref 0.0–0.1)
Basophils Relative: 0 %
Eosinophils Absolute: 0.2 10*3/uL (ref 0.0–0.5)
Eosinophils Relative: 2 %
Immature Granulocytes: 1 %
Lymphocytes Relative: 17 %
Lymphs Abs: 1.8 10*3/uL (ref 0.7–4.0)
Monocytes Absolute: 0.9 10*3/uL (ref 0.1–1.0)
Monocytes Relative: 8 %
Neutro Abs: 7.6 10*3/uL (ref 1.7–7.7)
Neutrophils Relative %: 72 %

## 2021-02-02 LAB — TRIGLYCERIDES: Triglycerides: 113 mg/dL (ref <150)

## 2021-02-02 LAB — PHOSPHORUS: Phosphorus: 4.4 mg/dL (ref 2.5–4.6)

## 2021-02-02 LAB — MAGNESIUM: Magnesium: 2.4 mg/dL (ref 1.7–2.4)

## 2021-02-02 MED ORDER — FAT EMUL FISH OIL/PLANT BASED 20% (SMOFLIPID)IV EMUL
INTRAVENOUS | Status: AC
  Filled 2021-02-02: qty 820.8

## 2021-02-02 MED ORDER — DEXTROSE-NACL 5-0.45 % IV SOLN: INTRAVENOUS | Status: AC

## 2021-02-02 MED ORDER — POTASSIUM CHLORIDE 10 MEQ/50ML IV SOLN
10.0000 meq | INTRAVENOUS | Status: AC
  Administered 2021-02-02 (×6): 10 meq via INTRAVENOUS
  Filled 2021-02-02 (×6): qty 50

## 2021-02-02 NOTE — Progress Notes (Signed)
SLP Cancellation Note  Patient Details Name: Dustin Golden MRN: [id number] DOB: Jul 01, 1956   Cancelled treatment:       Reason Eval/Treat Not Completed: Other (comment) (pt remains npo with output from NG tube) Kathleen Lime, MS [facility] SLP Acute Rehab Services Office [phone] Pager [phone]    [name] 02/02/2021, 11:28 AM

## 2021-02-02 NOTE — Progress Notes (Signed)
PT Cancellation Note  Patient Details Name: Dustin Golden MRN: [id number] DOB: 04-03-1956   Cancelled Treatment:    Reason Eval/Treat Not Completed: Other (comment) has ambulated x 2 w/nursing. Now in bed with NG suction. Continue to follow for PT needs.  [name] 02/02/2021, 2:34 PM  [place] Pager [phone] Office [phone]

## 2021-02-02 NOTE — Progress Notes (Signed)
PHARMACY - TOTAL PARENTERAL NUTRITION CONSULT NOTE   Indication: Prolonged ileus  Patient Measurements: Height: '6\' 3"'  (190.5 cm) Weight: 87.2 kg (192 lb 3.9 oz) IBW/kg (Calculated) : 84.5 TPN AdjBW (KG): 87.2 Body mass index is 24.03 kg/m.  Assessment: 62 y/oM with PMH of recent left ACA and MCA CVA s/p left ICA thrombectomy followed by vascular stenting on DAPT presented 4/4 with BRBPR. Colonoscopy on 4/7 with ulcerated cecal mass. 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 Patient underwent exploratory laparotomy, open right hemicolectomy on 4/12. Postop course complicated by ileus. Pharmacy consulted for TPN.   Glucose / Insulin: no hx of DM. CBGs 127-140 since TPN initiation. 2 units insulin received via SSI.   Electrolytes: K+ low at 3.1. Phos WNL, but rising (noted Phos elevated earlier this admission). Mag on upper end of goal range. All others WNL. Renal: SCr 0.96, BUN 12 Hepatic: AST/ALT, Alk Phos, Tbili WNL.  Triglycerides: 113 (4/20) Albumin/Prealbumin: Albumin 3.2, Prealbumin 22.2 (4/12), 13.7 (4/19) Intake / Output; MIVF: 721m UOP, 29020mNG output; I/O -274335mD5 1/2 NS at 19m29m GI Imaging: 4/17 CT abd/pelvis: Changes consistent with postoperative ileus with a transition zone in the mid ileum. The stomach is well distended with fluid and the postoperative ileus extends throughout the proximal small bowel. GI Surgeries / Procedures: 4/12 exploratory laparotomy, open right hemicolectomy for partially obstructing tumor in the cecum  Central access: PICC placed 4/19 TPN start date: 4/19  Nutritional Goals (per RD recommendation on 4/19): kCal: 2200-2400, Protein: 110-125g, Fluid: 2.2L/day  Goal TPN rate is 90mL69m(provides 123g of protein and 2264 kcals per day)  Current Nutrition:  NPO, TPN  Plan:  Now: KCl 10mEq33mx 6 doses At 1800:  Increase TPN to 19mL/h62mectrolytes in TPN: Na 50mEq/L22m50mEq/L,37m 5mEq/L, M40m.5mEq/L, an68mhos 5mmol/L. Cl68m 1:1 Add standard MVI and trace elements to TPN Decrease maintenance IV fluids to 40mL/hr  Con89me Sensitive q6h SSI and adjust as needed  Monitor TPN labs on Mon/Thurs   Leahna Hewson,Lindell SparS Clinical Pharmacist  02/02/2021,11:02 AM

## 2021-02-02 NOTE — Progress Notes (Signed)
PROGRESS NOTE    Dustin AMIRAULT  KZS:[id number] DOB: 05/25/1956 DOA: 01/17/2021 PCP: [name], PA   Brief Narrative: [name] with PMH of recent left ACA and MCA CVA s/p left ICA thrombectomy followed by vascular stenting with mild residual upper and lower extremity weakness and dysarthria on DAPT with aspirin and Brilinta presenting with BRBPR.  EGD on 4/6 with mild erythematous mucosa in duodenum.  Colonoscopy on 4/7 with ulcerated cecal mass.  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  Pathology with high-grade dysplasia.   Patient underwent laparotomy with right hemicolectomy on 01/25/2021.  Surgical pathology with stage II adenocarcinoma.  Postop course complicated by protracted ileus requiring NGT.  Now on TPN.   Assessment & Plan:   Principal Problem:   GIB (gastrointestinal bleeding) Active Problems:   Left middle cerebral artery stroke [facility]   Essential hypertension   Hyperlipidemia   Acute GI bleeding   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 with NG tube in place.  NG tube continues with drainage.  However his bowel functions have not been recovered yet. Continue NG tube, n.p.o., IV fluids. \ Encourage him to get out of bed as much as possible to improve bowel function.    #2 recent left ACA MCA stroke status post left ICA thrombectomy followed by stenting to the left ICA with mild right upper extremity and left lower extremity weakness and dysarthria.  Prior attending discussed with neurologist and had recommended to downgrade to aspirin.  #3 AKI resolved with IV fluids.  #4 Hypertension continue as needed hydralazine and labetalol.  #5 hypokalemia potassium was 3.2 today being  repleted in TPN.   Nutrition Problem: Increased nutrient needs Etiology: post-op healing     Signs/Symptoms: estimated needs    Interventions: TPN  Estimated body mass index is 24.03 kg/m as calculated from the following:   Height as of this encounter: 6\' 3"  (1.905 m).   Weight as of this encounter: 87.2 kg.  DVT prophylaxis:scd Code Status: Full code Family Communication: Patient's daughter was on the phone and speaker while I was in the room Disposition Plan:  Status is: Inpatient  Dispo: The patient is from: Home              Anticipated d/c is to: Home              Patient currently is not medically stable to d/c.   Difficult to place patient No    Consultants:   General surgery  Procedures: 01/19/2021-EGD 01/20/2021 colonoscopy with fungating mass in the cecum 01/25/2021 laparotomy and right hemicolectomy pathology with stage II adenocarcinoma the colon Antimicrobials: None  Subjective: He is resting in bed NG tube in place Reports 1 BM yesterday and some flatus however abdomen still distended  Objective: Vitals:   02/01/21 1325 02/01/21 2032 02/01/21 2342 02/02/21 0545  BP: 133/84 121/88 130/85 119/78  Pulse: (!) 101 (!) 101 (!) 110 (!) 102  Resp: 18 18  18   Temp:  99.1 F (37.3 C)  98.7 F (37.1 C)  TempSrc:  Oral  Oral  SpO2: 98% 95%  99%  Weight:      Height:        Intake/Output Summary (  Last 24 hours) at 02/02/2021 1255 Last data filed at 02/02/2021 1235 Gross per 24 hour  Intake 1758.08 ml  Output 3450 ml  Net -1691.92 ml   Filed Weights   01/17/21 1352 01/20/21 1135  Weight: 87.2 kg 87.2 kg    Examination:  General exam: Appears calm and comfortable  Respiratory system: Clear to auscultation. Respiratory effort normal. Cardiovascular system: S1 & S2 heard, RRR. No JVD, murmurs, rubs, gallops or clicks. No pedal edema. Gastrointestinal system: Abdomen is distended, soft and tender. No organomegaly or masses felt. Normal bowel sounds  heard. Central nervous system: Alert and oriented. No focal neurological deficits. Extremities: Symmetric 5 x 5 power. Skin: No rashes, lesions or ulcers Psychiatry: Judgement and insight appear normal. Mood & affect appropriate.     Data Reviewed: I have personally reviewed following labs and imaging studies  CBC: Recent Labs  Lab 01/29/21 0420 01/30/21 0539 01/31/21 0419 02/01/21 0032 02/02/21 0333  WBC 9.4 13.2* 12.1* 10.5 10.6*  NEUTROABS  --   --   --   --  7.6  HGB 9.6* 10.2* 9.2* 9.5* 9.3*  HCT 29.9* 31.9* 27.9* 29.1* 27.9*  MCV 80.6 80.4 79.5* 79.5* 79.0*  PLT 342 403* 334 367 496   Basic Metabolic Panel: Recent Labs  Lab 01/28/21 0409 01/29/21 0420 02/01/21 0032 02/02/21 0333  NA 139 143 141 144  K 3.8 3.7 3.8 3.1*  CL 104 109 108 107  CO2 25 25 22 27   GLUCOSE 148* 128* 116* 127*  BUN 32* 14 11 12   CREATININE 1.68* 1.08 0.97 0.96  CALCIUM 9.1 8.8* 8.8* 8.6*  MG 2.1 2.3 2.2 2.4  PHOS 4.9* 3.4 4.2 4.4   GFR: Estimated Creatinine Clearance: 92.9 mL/min (by C-G formula based on SCr of 0.96 mg/dL). Liver Function Tests: Recent Labs  Lab 01/28/21 0409 01/29/21 0420 02/01/21 0032 02/02/21 0333  AST  --  17 32 17  ALT  --  14 57* 39  ALKPHOS  --  72 130* 119  BILITOT  --  0.4 0.7 0.4  PROT  --  7.0 7.0 6.7  ALBUMIN 3.5 3.5 3.3* 3.2*   No results for input(s): LIPASE, AMYLASE in the last 168 hours. No results for input(s): AMMONIA in the last 168 hours. Coagulation Profile: No results for input(s): INR, PROTIME in the last 168 hours. Cardiac Enzymes: No results for input(s): CKTOTAL, CKMB, CKMBINDEX, TROPONINI in the last 168 hours. BNP (last 3 results) No results for input(s): PROBNP in the last 8760 hours. HbA1C: No results for input(s): HGBA1C in the last 72 hours. CBG: Recent Labs  Lab 02/01/21 2347 02/02/21 0612 02/02/21 1201  GLUCAP 140* 127* 110*   Lipid Profile: Recent Labs    02/02/21 0333  TRIG 113   Thyroid Function  Tests: No results for input(s): TSH, T4TOTAL, FREET4, T3FREE, THYROIDAB in the last 72 hours. Anemia Panel: No results for input(s): VITAMINB12, FOLATE, FERRITIN, TIBC, IRON, RETICCTPCT in the last 72 hours. Sepsis Labs: No results for input(s): PROCALCITON, LATICACIDVEN in the last 168 hours.  Recent Results (from the past 240 hour(s))  Surgical pcr screen     Status: None   Collection Time: 01/24/21  2:56 AM   Specimen: Nasal Mucosa; Nasal Swab  Result Value Ref Range Status   MRSA, PCR NEGATIVE NEGATIVE Final   Staphylococcus aureus NEGATIVE NEGATIVE Final    Comment: (NOTE) The Xpert SA Assay (FDA approved for NASAL specimens in patients 83 years of age and older), is one  component of a comprehensive surveillance program. It is not intended to diagnose infection nor to guide or monitor treatment. Performed at [facility], [place] [address], [place], [place] [address]          Radiology Studies: DG Abd Portable 1V  Result Date: 02/01/2021 CLINICAL DATA:  NG tube placement. EXAM: PORTABLE ABDOMEN - 1 VIEW COMPARISON:  CT 01/30/2021.  Abdomen 01/30/2021. FINDINGS: NG tube noted with tip coiled in the stomach. Distended loops of small bowel again noted. Although ileus could present this fashion, small-bowel obstruction cannot be excluded. Free air again noted, most likely from prior surgery. Surgical staples noted over the abdomen. IMPRESSION: NG tube noted with tip coiled in stomach. 2. Distended loops of small bowel again noted. Although ileus could present this fashion, small-bowel obstruction cannot be excluded. Free air again noted, most likely from prior surgery Electronically Signed   By: Marcello Moores  Register   On: 02/01/2021 11:11   [place] EKG SITE RITE  Result Date: 02/01/2021 If Site Rite image not attached, placement could not be confirmed due to current cardiac rhythm.       Scheduled Meds: . aspirin  150 mg Rectal Daily  . atorvastatin  20 mg Oral  Daily  . bupivacaine liposome  20 mL Infiltration Once  . Chlorhexidine Gluconate Cloth  6 each Topical Daily  . insulin aspart  0-9 Units Subcutaneous Q6H  . lidocaine  1 patch Transdermal Q24H  . lip balm  1 application Topical BID  . metoprolol tartrate  2.5 mg Intravenous Q6H  . ondansetron (ZOFRAN) IV  4 mg Intravenous Q6H  . pantoprazole (PROTONIX) IV  40 mg Intravenous Q24H  . sodium chloride flush  10-40 mL Intracatheter Q12H   Continuous Infusions: . chlorproMAZINE (THORAZINE) IV 25 mg (02/01/21 1739)  . dextrose 5 % and 0.45% NaCl 60 mL/hr at 02/02/21 0431  . dextrose 5 % and 0.45% NaCl    . methocarbamol (ROBAXIN) IV 500 mg (02/02/21 0630)  . potassium chloride 10 mEq (02/02/21 1147)  . TPN ADULT (ION) 40 mL/hr at 02/01/21 1802  . TPN ADULT (ION)       LOS: 14 days    Georgette Shell, MD 02/02/2021, 12:55 PM

## 2021-02-02 NOTE — Progress Notes (Addendum)
8 Days Post-Op   Subjective/Chief Complaint: NAEO. Reports one BM yesterday. Having some flatus. Did not get out of bed yesterday. I spoke with his daughter on the phone and answered her questions.  Objective: Vital signs in last 24 hours: Temp:  [98.7 F (37.1 C)-99.1 F (37.3 C)] 98.7 F (37.1 C) (04/20 0545) Pulse Rate:  [101-110] 102 (04/20 0545) Resp:  [18] 18 (04/20 0545) BP: (119-133)/(78-88) 119/78 (04/20 0545) SpO2:  [95 %-99 %] 99 % (04/20 0545) Last BM Date: 01/31/21  Intake/Output from previous day: 04/19 0701 - 04/20 0700 In: 1356.7 [I.V.:1256.7; IV Piggyback:100] Out: 4100 [Urine:700; Emesis/NG output:2900; Stool:500] Intake/Output this shift: No intake/output data recorded.  General appearance: resting in bed, appears uncomfortable Resp: normal work of breathing  GI: mild to moderate distention, incisions clean and dry    Lab Results:   Recent Labs    02/01/21 0032 02/02/21 0333  WBC 10.5 10.6*  HGB 9.5* 9.3*  HCT 29.1* 27.9*  PLT 367 386   BMET Recent Labs    02/01/21 0032 02/02/21 0333  NA 141 144  K 3.8 3.1*  CL 108 107  CO2 22 27  GLUCOSE 116* 127*  BUN 11 12  CREATININE 0.97 0.96  CALCIUM 8.8* 8.6*   PT/INR No results for input(s): LABPROT, INR in the last 72 hours. ABG No results for input(s): PHART, HCO3 in the last 72 hours.  Invalid input(s): PCO2, PO2  Studies/Results: DG Abd Portable 1V  Result Date: 02/01/2021 CLINICAL DATA:  NG tube placement. EXAM: PORTABLE ABDOMEN - 1 VIEW COMPARISON:  CT 01/30/2021.  Abdomen 01/30/2021. FINDINGS: NG tube noted with tip coiled in the stomach. Distended loops of small bowel again noted. Although ileus could present this fashion, small-bowel obstruction cannot be excluded. Free air again noted, most likely from prior surgery. Surgical staples noted over the abdomen. IMPRESSION: NG tube noted with tip coiled in stomach. 2. Distended loops of small bowel again noted. Although ileus could present  this fashion, small-bowel obstruction cannot be excluded. Free air again noted, most likely from prior surgery Electronically Signed   By: Marcello Moores  Register   On: 02/01/2021 11:11   [place] EKG SITE RITE  Result Date: 02/01/2021 If Site Rite image not attached, placement could not be confirmed due to current cardiac rhythm.   Assessment/Plan: s/p Procedure(s): LAPAROSCOPIC RIGHT COLECTOMY (N/A)   Recentleft ICA and left MCA/M2 infarctions/p mechanical thrombectomy and left carotid stenting and angioplasty 12/13/20 on aspirin and brillinta - Ok to continue aspirin suppository, resume Brilinta once NG is removed.  POD8, s/p lap converted to open R hemicolectomy for partially obstructing tumor in the cecum, Dr. [name] 01/25/21 -surgical path: Invasive colorectal adenocarcinoma, 4.4 cm, negative margins, 0/28 nodes. - Patient is having bowel movements but persistent signs of ileus -- abd distention, pain, high volume emesis 4/19 so NGT was replaced. 2,900 cc/24h.  - continue NG to LIWS - continue NPO, TPN - OOB, mobilize more today, PT eval -- they came by yesterday but pt was getting a PICC line, will see if they can work with him today,   LOS: 14 days   Obie Dredge, [facility]   02/02/21 8:53 AM

## 2021-02-03 LAB — COMPREHENSIVE METABOLIC PANEL
ALT: 31 U/L (ref 0–44)
AST: 16 U/L (ref 15–41)
Albumin: 3 g/dL — ABNORMAL LOW (ref 3.5–5.0)
Alkaline Phosphatase: 107 U/L (ref 38–126)
Anion gap: 9 (ref 5–15)
BUN: 11 mg/dL (ref 8–23)
CO2: 25 mmol/L (ref 22–32)
Calcium: 8.6 mg/dL — ABNORMAL LOW (ref 8.9–10.3)
Chloride: 109 mmol/L (ref 98–111)
Creatinine, Ser: 0.99 mg/dL (ref 0.61–1.24)
GFR, Estimated: >60 mL/min (ref >60)
Glucose, Bld: 131 mg/dL — ABNORMAL HIGH (ref 70–99)
Potassium: 3.7 mmol/L (ref 3.5–5.1)
Sodium: 143 mmol/L (ref 135–145)
Total Bilirubin: 0.3 mg/dL (ref 0.3–1.2)
Total Protein: 6.8 g/dL (ref 6.5–8.1)

## 2021-02-03 LAB — GLUCOSE, CAPILLARY
Glucose-Capillary: 132 mg/dL — ABNORMAL HIGH (ref 70–99)
Glucose-Capillary: 114 mg/dL — ABNORMAL HIGH (ref 70–99)
Glucose-Capillary: 105 mg/dL — ABNORMAL HIGH (ref 70–99)

## 2021-02-03 LAB — SURGICAL PATHOLOGY

## 2021-02-03 LAB — PHOSPHORUS: Phosphorus: 4 mg/dL (ref 2.5–4.6)

## 2021-02-03 LAB — MAGNESIUM: Magnesium: 2.2 mg/dL (ref 1.7–2.4)

## 2021-02-03 MED ORDER — POTASSIUM CHLORIDE 10 MEQ/50ML IV SOLN
10.0000 meq | INTRAVENOUS | Status: AC
Start: 2021-02-03 — End: 2021-02-03
  Administered 2021-02-03 (×3): 10 meq via INTRAVENOUS
  Filled 2021-02-03 (×3): qty 50

## 2021-02-03 MED ORDER — DEXTROSE-NACL 5-0.45 % IV SOLN: INTRAVENOUS | Status: DC

## 2021-02-03 MED ORDER — TRAVASOL 10 % IV SOLN
INTRAVENOUS | Status: AC
  Filled 2021-02-03 (×2): qty 1231.2

## 2021-02-03 NOTE — Evaluation (Signed)
SLP Cancellation Note  Patient Details Name: RODOLPH HAGEMANN MRN: [id number] DOB: [birth year]   Cancelled treatment:       Reason Eval/Treat Not Completed: Other (comment) (pt remains needing NG suctioning)  Kathleen Lime, MS [facility] SLP Acute Rehab Services Office [phone] Pager [phone]    [name] 02/03/2021, 2:33 PM

## 2021-02-03 NOTE — Progress Notes (Signed)
PROGRESS NOTE    Dustin Golden  OEU:[id number] DOB: July 11, 1956 DOA: 01/17/2021 PCP: [name], PA   Brief Narrative: [name] with PMH of recent left ACA and MCA CVA s/p left ICA thrombectomy followed by vascular stenting with mild residual upper and lower extremity weakness and dysarthria on DAPT with aspirin and Brilinta presenting with BRBPR.  EGD on 4/6 with mild erythematous mucosa in duodenum.  Colonoscopy on 4/7 with ulcerated cecal mass.  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  Pathology with high-grade dysplasia.   Patient underwent laparotomy with right hemicolectomy on 01/25/2021.  Surgical pathology with stage II adenocarcinoma.  Postop course complicated by protracted ileus requiring NGT.  Now on TPN.   Assessment & Plan:   Principal Problem:   GIB (gastrointestinal bleeding) Active Problems:   Left middle cerebral artery stroke [facility])   Essential hypertension   Hyperlipidemia   Acute GI bleeding   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   Adenocarcinoma of colon ([place])   #1 stage II adenocarcinoma of the colon status post right hemicolectomy and laparotomy now complicated by prolonged postop ileus on TPN and n.p.o. with NG tube in place.  NG tube continues with drainage but less than yesterday.  Patient reports having a bowel movement last night.  He ambulated in the hallway 4 times.  Encouraged him to get out of bed and ambulate today.   ngt to gravity.  #2 recent left ACA MCA stroke status post left ICA thrombectomy followed by stenting to the left ICA with mild right upper extremity and left lower extremity weakness and dysarthria.  Prior attending discussed with neurologist and had recommended to downgrade to aspirin.  #3 AKI resolved with IV fluids.  #4 Hypertension continue as needed hydralazine and labetalol.  #5 hypokalemia -resolved   Nutrition  Problem: Increased nutrient needs Etiology: post-op healing     Signs/Symptoms: estimated needs    Interventions: TPN  Estimated body mass index is 24.03 kg/m as calculated from the following:   Height as of this encounter: 6\' 3"  (1.905 m).   Weight as of this encounter: 87.2 kg.  DVT prophylaxis:scd Code Status: Full code Family Communication: Patient's daughter was on the phone and speaker while I was in the room Disposition Plan:  Status is: Inpatient  Dispo: The patient is from: Home              Anticipated d/c is to: Home              Patient currently is not medically stable to d/c.   Difficult to place patient No    Consultants:   General surgery  Procedures: 01/19/2021-EGD 01/20/2021 colonoscopy with fungating mass in the cecum 01/25/2021 laparotomy and right hemicolectomy pathology with stage II adenocarcinoma the colon Antimicrobials: None  Subjective: He is resting in bed NG tube in place Reports 1 BM yesterday and some flatus  Objective: Vitals:   02/02/21 1355 02/02/21 2038 02/02/21 2353 02/03/21 0627  BP: 113/80 112/76 121/82 116/79  Pulse: 89 89 89 90  Resp: 18 18  18   Temp: 98.3 F (36.8 C) 98.7 F (37.1 C)  98.2 F (36.8 C)  TempSrc: Oral Oral  Oral  SpO2: 97% 96%  99%  Weight:      Height:        Intake/Output Summary (Last 24 hours) at 02/03/2021 1157 Last data filed  at 02/03/2021 1000 Gross per 24 hour  Intake 2713.77 ml  Output 2651 ml  Net 62.77 ml   Filed Weights   01/17/21 1352 01/20/21 1135  Weight: 87.2 kg 87.2 kg    Examination:  General exam: Appears calm and comfortable  Respiratory system: Clear to auscultation. Respiratory effort normal. Cardiovascular system: S1 & S2 heard, RRR. No JVD, murmurs, rubs, gallops or clicks. No pedal edema. Gastrointestinal system: Abdomen is  Decreased distension, soft and tender. No organomegaly or masses felt. Normal bowel sounds heard. Central nervous system: Alert and oriented. No  focal neurological deficits. Extremities: Symmetric 5 x 5 power. Skin: No rashes, lesions or ulcers Psychiatry: Judgement and insight appear normal. Mood & affect appropriate.     Data Reviewed: I have personally reviewed following labs and imaging studies  CBC: Recent Labs  Lab 01/29/21 0420 01/30/21 0539 01/31/21 0419 02/01/21 0032 02/02/21 0333  WBC 9.4 13.2* 12.1* 10.5 10.6*  NEUTROABS  --   --   --   --  7.6  HGB 9.6* 10.2* 9.2* 9.5* 9.3*  HCT 29.9* 31.9* 27.9* 29.1* 27.9*  MCV 80.6 80.4 79.5* 79.5* 79.0*  PLT 342 403* 334 367 791   Basic Metabolic Panel: Recent Labs  Lab 01/28/21 0409 01/29/21 0420 02/01/21 0032 02/02/21 0333 02/03/21 0321  NA 139 143 141 144 143  K 3.8 3.7 3.8 3.1* 3.7  CL 104 109 108 107 109  CO2 25 25 22 27 25   GLUCOSE 148* 128* 116* 127* 131*  BUN 32* 14 11 12 11   CREATININE 1.68* 1.08 0.97 0.96 0.99  CALCIUM 9.1 8.8* 8.8* 8.6* 8.6*  MG 2.1 2.3 2.2 2.4 2.2  PHOS 4.9* 3.4 4.2 4.4 4.0   GFR: Estimated Creatinine Clearance: 90.1 mL/min (by C-G formula based on SCr of 0.99 mg/dL). Liver Function Tests: Recent Labs  Lab 01/28/21 0409 01/29/21 0420 02/01/21 0032 02/02/21 0333 02/03/21 0321  AST  --  17 32 17 16  ALT  --  14 57* 39 31  ALKPHOS  --  72 130* 119 107  BILITOT  --  0.4 0.7 0.4 0.3  PROT  --  7.0 7.0 6.7 6.8  ALBUMIN 3.5 3.5 3.3* 3.2* 3.0*   No results for input(s): LIPASE, AMYLASE in the last 168 hours. No results for input(s): AMMONIA in the last 168 hours. Coagulation Profile: No results for input(s): INR, PROTIME in the last 168 hours. Cardiac Enzymes: No results for input(s): CKTOTAL, CKMB, CKMBINDEX, TROPONINI in the last 168 hours. BNP (last 3 results) No results for input(s): PROBNP in the last 8760 hours. HbA1C: No results for input(s): HGBA1C in the last 72 hours. CBG: Recent Labs  Lab 02/02/21 1201 02/02/21 1803 02/02/21 2342 02/03/21 0633 02/03/21 1107  GLUCAP 110* 108* 131* 132* 114*   Lipid  Profile: Recent Labs    02/02/21 0333  TRIG 113   Thyroid Function Tests: No results for input(s): TSH, T4TOTAL, FREET4, T3FREE, THYROIDAB in the last 72 hours. Anemia Panel: No results for input(s): VITAMINB12, FOLATE, FERRITIN, TIBC, IRON, RETICCTPCT in the last 72 hours. Sepsis Labs: No results for input(s): PROCALCITON, LATICACIDVEN in the last 168 hours.  No results found for this or any previous visit (from the past 240 hour(s)).       Radiology Studies: No results found.      Scheduled Meds: . aspirin  150 mg Rectal Daily  . bupivacaine liposome  20 mL Infiltration Once  . Chlorhexidine Gluconate Cloth  6 each Topical  Daily  . insulin aspart  0-9 Units Subcutaneous Q6H  . lidocaine  1 patch Transdermal Q24H  . lip balm  1 application Topical BID  . metoprolol tartrate  2.5 mg Intravenous Q6H  . ondansetron (ZOFRAN) IV  4 mg Intravenous Q6H  . pantoprazole (PROTONIX) IV  40 mg Intravenous Q24H  . sodium chloride flush  10-40 mL Intracatheter Q12H   Continuous Infusions: . chlorproMAZINE (THORAZINE) IV 25 mg (02/01/21 1739)  . dextrose 5 % and 0.45% NaCl 40 mL/hr at 02/02/21 2355  . dextrose 5 % and 0.45% NaCl    . methocarbamol (ROBAXIN) IV 500 mg (02/03/21 6546)  . potassium chloride 10 mEq (02/03/21 1123)  . TPN ADULT (ION) 60 mL/hr at 02/02/21 2200  . TPN ADULT (ION)       LOS: 15 days    Georgette Shell, MD 02/03/2021, 11:57 AM

## 2021-02-03 NOTE — Progress Notes (Signed)
9 Days Post-Op   Subjective/Chief Complaint: Feels better today. Mobilized a lot yesterday, reports a non-bloody BM at 8 PM. Also having flatus.  NG 800cc / 24h Objective: Vital signs in last 24 hours: Temp:  [98.2 F (36.8 C)-98.7 F (37.1 C)] 98.2 F (36.8 C) (04/21 0627) Pulse Rate:  [89-90] 90 (04/21 0627) Resp:  [18] 18 (04/21 0627) BP: (112-121)/(76-82) 116/79 (04/21 0627) SpO2:  [96 %-99 %] 99 % (04/21 0627) Last BM Date: 01/31/21  Intake/Output from previous day: 04/20 0701 - 04/21 0700 In: 2685.2 [I.V.:2334.6; IV Piggyback:350.5] Out: 2851 [Urine:2050; Emesis/NG output:800; Stool:1] Intake/Output this shift: No intake/output data recorded.  General appearance: resting in bed, appears uncomfortable Resp: normal work of breathing  GI: mild distention (improved compared to previous exam), incisions clean and dry, present but hypoactive BS.   Lab Results:   Recent Labs    02/01/21 0032 02/02/21 0333  WBC 10.5 10.6*  HGB 9.5* 9.3*  HCT 29.1* 27.9*  PLT 367 386   BMET Recent Labs    02/02/21 0333 02/03/21 0321  NA 144 143  K 3.1* 3.7  CL 107 109  CO2 27 25  GLUCOSE 127* 131*  BUN 12 11  CREATININE 0.96 0.99  CALCIUM 8.6* 8.6*   PT/INR No results for input(s): LABPROT, INR in the last 72 hours. ABG No results for input(s): PHART, HCO3 in the last 72 hours.  Invalid input(s): PCO2, PO2  Studies/Results: DG Abd Portable 1V  Result Date: 02/01/2021 CLINICAL DATA:  NG tube placement. EXAM: PORTABLE ABDOMEN - 1 VIEW COMPARISON:  CT 01/30/2021.  Abdomen 01/30/2021. FINDINGS: NG tube noted with tip coiled in the stomach. Distended loops of small bowel again noted. Although ileus could present this fashion, small-bowel obstruction cannot be excluded. Free air again noted, most likely from prior surgery. Surgical staples noted over the abdomen. IMPRESSION: NG tube noted with tip coiled in stomach. 2. Distended loops of small bowel again noted. Although ileus  could present this fashion, small-bowel obstruction cannot be excluded. Free air again noted, most likely from prior surgery Electronically Signed   By: [place]   On: 02/01/2021 11:11    Assessment/Plan: s/p Procedure(s): LAPAROSCOPIC RIGHT COLECTOMY (N/A)   Recentleft ICA and left MCA/M2 infarctions/p mechanical thrombectomy and left carotid stenting and angioplasty 12/13/20 on aspirin and brillinta - Ok to continue aspirin suppository, per neuro plan to stop Brilinta  POD9, s/p lap converted to open R hemicolectomy for partially obstructing tumor in the cecum, Dr. [name] 01/25/21 -surgical path: Invasive colorectal adenocarcinoma, 4.4 cm, negative margins, 0/28 nodes. - Patient is having bowel movements but persistent signs of ileus -- abd distention, pain, high volume emesis 4/19 so NGT was replaced.  - NG output trending down (800cc/24h), place NG to gravity today and monitor - continue NPO, TPN - OOB, mobilize   LOS: 15 days   Obie Dredge, [facility]   02/03/21 8:53 AM

## 2021-02-03 NOTE — Progress Notes (Signed)
PHARMACY - TOTAL PARENTERAL NUTRITION CONSULT NOTE   Indication: Prolonged ileus  Patient Measurements: Height: '6\' 3"'  (190.5 cm) Weight: 87.2 kg (192 lb 3.9 oz) IBW/kg (Calculated) : 84.5 TPN AdjBW (KG): 87.2 Body mass index is 24.03 kg/m.  Assessment: 60 y/oM with PMH of recent left ACA and MCA CVA s/p left ICA thrombectomy followed by vascular stenting on DAPT presented 4/4 with BRBPR. Colonoscopy on 4/7 with ulcerated cecal mass. 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 Patient underwent exploratory laparotomy, open right hemicolectomy on 4/12. Postop course complicated by ileus. Pharmacy consulted for TPN.   Glucose / Insulin: no hx of DM. CBGs at goal < 150. 2 units insulin received via SSI.   Electrolytes: All WNL, K increased from 3.1 to 3.7 Renal: SCr 0.99 stable, BUN 11 Hepatic: AST/ALT, Alk Phos, Tbili WNL.  Triglycerides: 113 (4/20) Albumin/Prealbumin: Albumin 3, Prealbumin 22.2 (4/12), 13.7 (4/19) Intake / Output; MIVF: 2020m UOP increased, 8066mNG output decreased; I/O -16647mD5 1/2 NS at 37m54m GI Imaging: 4/17 CT abd/pelvis: Changes consistent with postoperative ileus with a transition zone in the mid ileum. The stomach is well distended with fluid and the postoperative ileus extends throughout the proximal small bowel. GI Surgeries / Procedures: 4/12 exploratory laparotomy, open right hemicolectomy for partially obstructing tumor in the cecum  Central access: PICC placed 4/19 TPN start date: 4/19  Nutritional Goals (per RD recommendation on 4/19): kCal: 2200-2400, Protein: 110-125g, Fluid: 2.2L/day  Goal TPN rate is 90mL1m(provides 123g of protein and 2264 kcals per day)  Current Nutrition:  NPO, TPN  Plan:  Now:  KCl 10mEq45mx 3 doses to get K > 4  At 1800:   Increase TPN to goal 90mL/h48mlectrolytes in TPN: Na 50mEq/L61m50mEq/L,37m5mEq/L, M49m.5mEq/L, an55mhos 5mmol/L. Cl71m 1:1  Add  standard MVI and trace elements to TPN  Decrease maintenance IV fluids to KVO  [place]ensitive q6h SSI and adjust as needed   Monitor TPN labs on Mon/Thurs   Jyra Lagares M LegAdrian SaranPS 02/03/2021 10:20 AM

## 2021-02-04 LAB — CBC
HCT: 27.6 % — ABNORMAL LOW (ref 39.0–52.0)
Hemoglobin: 8.8 g/dL — ABNORMAL LOW (ref 13.0–17.0)
MCH: 25.7 pg — ABNORMAL LOW (ref 26.0–34.0)
MCHC: 31.9 g/dL (ref 30.0–36.0)
MCV: 80.5 fL (ref 80.0–100.0)
Platelets: 396 10*3/uL (ref 150–400)
RBC: 3.43 MIL/uL — ABNORMAL LOW (ref 4.22–5.81)
RDW: 13.9 % (ref 11.5–15.5)
WBC: 12.1 10*3/uL — ABNORMAL HIGH (ref 4.0–10.5)
nRBC: 0 % (ref 0.0–0.2)

## 2021-02-04 LAB — GLUCOSE, CAPILLARY
Glucose-Capillary: 109 mg/dL — ABNORMAL HIGH (ref 70–99)
Glucose-Capillary: 122 mg/dL — ABNORMAL HIGH (ref 70–99)
Glucose-Capillary: 124 mg/dL — ABNORMAL HIGH (ref 70–99)
Glucose-Capillary: 125 mg/dL — ABNORMAL HIGH (ref 70–99)
Glucose-Capillary: 129 mg/dL — ABNORMAL HIGH (ref 70–99)
Glucose-Capillary: 133 mg/dL — ABNORMAL HIGH (ref 70–99)

## 2021-02-04 LAB — BASIC METABOLIC PANEL
Anion gap: 10 (ref 5–15)
BUN: 14 mg/dL (ref 8–23)
CO2: 24 mmol/L (ref 22–32)
Calcium: 9.1 mg/dL (ref 8.9–10.3)
Chloride: 112 mmol/L — ABNORMAL HIGH (ref 98–111)
Creatinine, Ser: 1.01 mg/dL (ref 0.61–1.24)
GFR, Estimated: >60 mL/min (ref >60)
Glucose, Bld: 118 mg/dL — ABNORMAL HIGH (ref 70–99)
Potassium: 4.2 mmol/L (ref 3.5–5.1)
Sodium: 146 mmol/L — ABNORMAL HIGH (ref 135–145)

## 2021-02-04 LAB — PHOSPHORUS: Phosphorus: 4.4 mg/dL (ref 2.5–4.6)

## 2021-02-04 LAB — MAGNESIUM: Magnesium: 2.2 mg/dL (ref 1.7–2.4)

## 2021-02-04 MED ORDER — TRAVASOL 10 % IV SOLN
INTRAVENOUS | Status: AC
  Filled 2021-02-04: qty 1231.2

## 2021-02-04 NOTE — Progress Notes (Signed)
PHARMACY - TOTAL PARENTERAL NUTRITION CONSULT NOTE   Indication: Prolonged ileus  Patient Measurements: Height: _0  (190.5 cm) Weight: 87.2 kg (192 lb 3.9 oz) IBW/kg (Calculated) : 84.5 TPN AdjBW (KG): 87.2 Body mass index is 24.03 kg/m.  Assessment: 83 y/oM with PMH of recent left ACA and MCA CVA s/p left ICA thrombectomy followed by vascular stenting on DAPT presented 4/4 with BRBPR. Colonoscopy on 4/7 with ulcerated cecal mass. 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 Patient underwent exploratory laparotomy, open right hemicolectomy on 4/12. Postop course complicated by ileus. Pharmacy consulted for TPN.   Note patient reports incontinence of stool overnight 4/21-22, had 4 BMs 4/21.   Glucose / Insulin: no hx of DM. CBGs at goal < 150. 2 units insulin received via SSI.   Electrolytes: WNL except Na 146. K increased from 3.7 to 4.2 Renal: SCr 0.99 stable, BUN 11 Hepatic: AST/ALT, Alk Phos, Tbili WNL.  Triglycerides: 113 (4/20) Albumin/Prealbumin: Albumin 3, Prealbumin 22.2 (4/12), 13.7 (4/19) Intake / Output; MIVF: 1717m UOP down, 13060mNG output increased then NGT pulled 4/22; I/O -154062mD5 1/2 NS at 52m85m GI Imaging: 4/17 CT abd/pelvis: Changes consistent with postoperative ileus with a transition zone in the mid ileum. The stomach is well distended with fluid and the postoperative ileus extends throughout the proximal small bowel. GI Surgeries / Procedures: 4/12 exploratory laparotomy, open right hemicolectomy for partially obstructing tumor in the cecum 4/22: CCS pulled NGT with patient having BMs, allow ice chips and sips and monitor with NG out  Central access: PICC placed 4/19 TPN start date: 4/19  Nutritional Goals (per RD recommendation on 4/19): kCal: 2200-2400, Protein: 110-125g, Fluid: 2.2L/day  Goal TPN rate is 90mL32m(provides 123g of protein and 2264 kcals per day)  Current Nutrition:  NPO x ice chips  and sips started as of 4/22  Plan: At 1800:   Continue TPN at goal 90mL/85mElectrolytes in TPN: Na 35mEq/62meduced), K 45mEq/L43mduced), Ca 5mEq/L, 63m2.5mEq/L, a54mPhos 5mmol/L. C45mc 1:1  Add standard MVI and trace elements to TPN  Decrease maintenance IV fluids to KVO  Continue Sensitive q6h SSI and adjust as needed   Monitor TPN labs on Mon/Thurs   Adrielle Polakowski M LeAdrian SaranCPS Secure Chat if ?s 02/04/2021 10:30 AM

## 2021-02-04 NOTE — Progress Notes (Signed)
PROGRESS NOTE    Dustin Golden  KGM:[id number] DOB: [birth year] DOA: 01/17/2021 PCP: [name], PA   Brief Narrative: [name] with PMH of recent left ACA and MCA CVA s/p left ICA thrombectomy followed by vascular stenting with mild residual upper and lower extremity weakness and dysarthria on DAPT with aspirin and Brilinta presenting with BRBPR.  EGD on 4/6 with mild erythematous mucosa in duodenum.  Colonoscopy on 4/7 with ulcerated cecal mass.  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  Pathology with high-grade dysplasia.   Patient underwent laparotomy with right hemicolectomy on 01/25/2021.  Surgical pathology with stage II adenocarcinoma.  Postop course complicated by protracted ileus requiring NGT.  Now on TPN.   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 with NG tube in place.   NG tube was pulled out on 02/04/2021.   He reported a rough night with 3 loose bowel movements having flatus and more than anything the NG tube is causing him pain and sore throat.  Surgery has allowed ice chips and sips and monitoring off of NG tube.  Remains on TPN.  #2 recent left ACA MCA stroke status post left ICA thrombectomy followed by stenting to the left ICA with mild right upper extremity and left lower extremity weakness and dysarthria.  Prior attending discussed with neurologist and had recommended to downgrade to aspirin.  #3 AKI resolved with IV fluids.  #4 Hypertension continue as needed hydralazine and  labetalol.  #5 hypokalemia -resolved   Nutrition Problem: Increased nutrient needs Etiology: post-op healing     Signs/Symptoms: estimated needs    Interventions: TPN  Estimated body mass index is 24.03 kg/m as calculated from the following:   Height as of this encounter: 6\' 3"  (1.905 m).   Weight as of this encounter: 87.2 kg.  DVT prophylaxis:scd Code Status: Full code Family Communication: Patient's daughter was on the phone and speaker while I was in the room Disposition Plan:  Status is: Inpatient  Dispo: The patient is from: Home              Anticipated d/c is to: Home              Patient currently is not medically stable to d/c.   Difficult to place patient No    Consultants:   General surgery  Procedures: 01/19/2021-EGD 01/20/2021 colonoscopy with fungating mass in the cecum 01/25/2021 laparotomy and right hemicolectomy pathology with stage II adenocarcinoma the colon Antimicrobials: None  Subjective: Appears weak and more tired than yesterday very uncomfortable complaining of sore throat and pain with NG tube in place.  He showed his 3 fingers and said that he had 3 bowel movements overnight. Objective: Vitals:   02/04/21 0028 02/04/21 0459 02/04/21 0940 02/04/21 1358  BP: 117/80 122/77 120/71 102/76  Pulse: 96 95 88 89  Resp:  17 17 17   Temp:  98.4 F (36.9 C) 99.3 F (37.4 C) 98.8 F (37.1 C)  TempSrc:  Oral Oral Oral  SpO2:  97% 96% 100%  Weight:      Height:        Intake/Output Summary (Last 24 hours) at 02/04/2021 1401 Last data filed at 02/04/2021 0913 Gross per 24 hour  Intake 2484.42 ml  Output 2900 ml  Net -415.58 ml   Filed Weights   01/17/21 1352 01/20/21 1135  Weight: 87.2 kg 87.2 kg    Examination:  General exam: Appears calm and comfortable  Respiratory system: Clear to auscultation. Respiratory effort normal. Cardiovascular system: S1 & S2 heard, RRR. No JVD, murmurs, rubs, gallops or clicks. No pedal  edema. Gastrointestinal system: Abdomen is  Decreased distension, soft and tender. No organomegaly or masses felt. Normal bowel sounds heard. Central nervous system: Alert and oriented. No focal neurological deficits. Extremities: Symmetric 5 x 5 power. Skin: No rashes, lesions or ulcers Psychiatry: Judgement and insight appear normal. Mood & affect appropriate.     Data Reviewed: I have personally reviewed following labs and imaging studies  CBC: Recent Labs  Lab 01/30/21 0539 01/31/21 0419 02/01/21 0032 02/02/21 0333 02/04/21 0400  WBC 13.2* 12.1* 10.5 10.6* 12.1*  NEUTROABS  --   --   --  7.6  --   HGB 10.2* 9.2* 9.5* 9.3* 8.8*  HCT 31.9* 27.9* 29.1* 27.9* 27.6*  MCV 80.4 79.5* 79.5* 79.0* 80.5  PLT 403* 334 367 386 332   Basic Metabolic Panel: Recent Labs  Lab 01/29/21 0420 02/01/21 0032 02/02/21 0333 02/03/21 0321 02/04/21 0400  NA 143 141 144 143 146*  K 3.7 3.8 3.1* 3.7 4.2  CL 109 108 107 109 112*  CO2 25 22 27 25 24   GLUCOSE 128* 116* 127* 131* 118*  BUN 14 11 12 11 14   CREATININE 1.08 0.97 0.96 0.99 1.01  CALCIUM 8.8* 8.8* 8.6* 8.6* 9.1  MG 2.3 2.2 2.4 2.2 2.2  PHOS 3.4 4.2 4.4 4.0 4.4   GFR: Estimated Creatinine Clearance: 88.3 mL/min (by C-G formula based on SCr of 1.01 mg/dL). Liver Function Tests: Recent Labs  Lab 01/29/21 0420 02/01/21 0032 02/02/21 0333 02/03/21 0321  AST 17 32 17 16  ALT 14 57* 39 31  ALKPHOS 72 130* 119 107  BILITOT 0.4 0.7 0.4 0.3  PROT 7.0 7.0 6.7 6.8  ALBUMIN 3.5 3.3* 3.2* 3.0*   No results for input(s): LIPASE, AMYLASE in the last 168 hours. No results for input(s): AMMONIA in the last 168 hours. Coagulation Profile: No results for input(s): INR, PROTIME in the last 168 hours. Cardiac Enzymes: No results for input(s): CKTOTAL, CKMB, CKMBINDEX, TROPONINI in the last 168 hours. BNP (last 3 results) No results for input(s): PROBNP in the last 8760 hours. HbA1C: No results for input(s): HGBA1C in the last 72  hours. CBG: Recent Labs  Lab 02/03/21 1734 02/04/21 0000 02/04/21 0500 02/04/21 0743 02/04/21 1140  GLUCAP 105* 129* 133* 125* 122*   Lipid Profile: Recent Labs    02/02/21 0333  TRIG 113   Thyroid Function Tests: No results for input(s): TSH, T4TOTAL, FREET4, T3FREE, THYROIDAB in the last 72 hours. Anemia Panel: No results for input(s): VITAMINB12, FOLATE, FERRITIN, TIBC, IRON, RETICCTPCT in the last 72 hours. Sepsis Labs: No results for input(s): PROCALCITON, LATICACIDVEN in the last 168 hours.  No results found for this or any previous visit (from the past 240 hour(s)).       Radiology Studies: No results found.      Scheduled Meds: . aspirin  150 mg Rectal Daily  . bupivacaine liposome  20 mL  Infiltration Once  . Chlorhexidine Gluconate Cloth  6 each Topical Daily  . insulin aspart  0-9 Units Subcutaneous Q6H  . lidocaine  1 patch Transdermal Q24H  . lip balm  1 application Topical BID  . metoprolol tartrate  2.5 mg Intravenous Q6H  . ondansetron (ZOFRAN) IV  4 mg Intravenous Q6H  . pantoprazole (PROTONIX) IV  40 mg Intravenous Q24H  . sodium chloride flush  10-40 mL Intracatheter Q12H   Continuous Infusions: . chlorproMAZINE (THORAZINE) IV 25 mg (02/01/21 1739)  . dextrose 5 % and 0.45% NaCl 20 mL/hr at 02/03/21 1713  . methocarbamol (ROBAXIN) IV 500 mg (02/04/21 1220)  . TPN ADULT (ION) 90 mL/hr at 02/03/21 1750  . TPN ADULT (ION)       LOS: 16 days    Georgette Shell, MD 02/04/2021, 2:01 PM

## 2021-02-04 NOTE — Progress Notes (Addendum)
10 Days Post-Op   Subjective/Chief Complaint: Had a rough day yesterday - reports incontinence of stool trying to get to the bathroom. Is having flatus. Reports about 4 BMs yesterday. States he was up all night because he felt like his tube was in his throat.   NG 800cc / 24h Objective: Vital signs in last 24 hours: Temp:  [97.9 F (36.6 C)-99 F (37.2 C)] 98.4 F (36.9 C) (04/22 0459) Pulse Rate:  [93-96] 95 (04/22 0459) Resp:  [17-19] 17 (04/22 0459) BP: (114-133)/(75-93) 122/77 (04/22 0459) SpO2:  [97 %-100 %] 97 % (04/22 0459) Last BM Date: 02/04/21  Intake/Output from previous day: 04/21 0701 - 04/22 0700 In: 1509.6 [I.V.:1117.3; IV Piggyback:392.2] Out: 5462 [Urine:1750; Emesis/NG output:1300] Intake/Output this shift: Total I/O In: -  Out: 200 [Urine:200]  General appearance: resting in bed, appears uncomfortable Resp: normal work of breathing  GI: mild distention (improved compared to previous exam), incisions clean and dry, +BS. NG has fallen out almost completely so I removed it.   Lab Results:   Recent Labs    02/02/21 0333 02/04/21 0400  WBC 10.6* 12.1*  HGB 9.3* 8.8*  HCT 27.9* 27.6*  PLT 386 396   BMET Recent Labs    02/03/21 0321 02/04/21 0400  NA 143 146*  K 3.7 4.2  CL 109 112*  CO2 25 24  GLUCOSE 131* 118*  BUN 11 14  CREATININE 0.99 1.01  CALCIUM 8.6* 9.1   PT/INR No results for input(s): LABPROT, INR in the last 72 hours. ABG No results for input(s): PHART, HCO3 in the last 72 hours.  Invalid input(s): PCO2, PO2  Studies/Results: No results found.  Assessment/Plan: s/p Procedure(s): LAPAROSCOPIC RIGHT COLECTOMY (N/A)   Recentleft ICA and left MCA/M2 infarctions/p mechanical thrombectomy and left carotid stenting and angioplasty 12/13/20 on aspirin and brillinta - Ok to continue aspirin suppository, per neuro plan to stop Brilinta  POD10, s/p lap converted to open R hemicolectomy for partially obstructing tumor in the  cecum, Dr. [name] 01/25/21 -surgical path: Invasive colorectal adenocarcinoma, 4.4 cm, negative margins, 0/28 nodes. - Patient is having bowel movements but persistent signs of ileus -- abd distention, pain, high volume emesis 4/19 so NGT was replaced.   - tolerated NG to gravity 4/21, having BMs, NG was accidentally partially pulled out overnight so I removed it. Allow ice chips and sips from floor and monitor with NG out. - continue NPO, TPN, sips/chips - OOB, mobilize   LOS: 16 days   Obie Dredge, [facility] Surgery General & Trauma Surgery   02/04/21 9:10 AM

## 2021-02-04 NOTE — Progress Notes (Signed)
Physical Therapy Discharge Patient Details Name: Dustin Golden MRN: [id number] DOB: 1955-12-18 Today's Date: 02/04/2021 Time:  -     Patient discharged from PT services secondary to goals met and no further PT needs identified.patient is ambulating in halls  Without assistance. Ambulated x 3 02/03/21 per patient.  Please see latest therapy progress note for current level of functioning and progress toward goals.    Progress and discharge plan discussed with patient and/or caregiver:   GP     [name] 02/04/2021, 6:57 AM [place] Pager [phone] Office [phone]

## 2021-02-05 LAB — BASIC METABOLIC PANEL
Anion gap: 8 (ref 5–15)
BUN: 16 mg/dL (ref 8–23)
CO2: 22 mmol/L (ref 22–32)
Calcium: 8.8 mg/dL — ABNORMAL LOW (ref 8.9–10.3)
Chloride: 107 mmol/L (ref 98–111)
Creatinine, Ser: 0.68 mg/dL (ref 0.61–1.24)
GFR, Estimated: >60 mL/min (ref >60)
Glucose, Bld: 119 mg/dL — ABNORMAL HIGH (ref 70–99)
Potassium: 4.1 mmol/L (ref 3.5–5.1)
Sodium: 137 mmol/L (ref 135–145)

## 2021-02-05 LAB — GLUCOSE, CAPILLARY
Glucose-Capillary: 126 mg/dL — ABNORMAL HIGH (ref 70–99)
Glucose-Capillary: 105 mg/dL — ABNORMAL HIGH (ref 70–99)
Glucose-Capillary: 135 mg/dL — ABNORMAL HIGH (ref 70–99)
Glucose-Capillary: 112 mg/dL — ABNORMAL HIGH (ref 70–99)
Glucose-Capillary: 126 mg/dL — ABNORMAL HIGH (ref 70–99)

## 2021-02-05 LAB — MAGNESIUM: Magnesium: 2.1 mg/dL (ref 1.7–2.4)

## 2021-02-05 LAB — PHOSPHORUS: Phosphorus: 4.1 mg/dL (ref 2.5–4.6)

## 2021-02-05 MED ORDER — TRAVASOL 10 % IV SOLN
INTRAVENOUS | Status: DC
  Filled 2021-02-05: qty 1231.2

## 2021-02-05 MED ORDER — ASPIRIN 81 MG PO CHEW
81.0000 mg | CHEWABLE_TABLET | Freq: Every day | ORAL | Status: DC
  Administered 2021-02-06 – 2021-02-07 (×2): 81 mg via ORAL
  Filled 2021-02-05 (×2): qty 1

## 2021-02-05 MED ORDER — INSULIN ASPART 100 UNIT/ML ~~LOC~~ SOLN
0.0000 [IU] | Freq: Three times a day (TID) | SUBCUTANEOUS | Status: DC
  Administered 2021-02-05 – 2021-02-06 (×2): 1 [IU] via SUBCUTANEOUS

## 2021-02-05 NOTE — Progress Notes (Signed)
11 Days Post-Op   Subjective/Chief Complaint: Patient denies nausea/vomiting without NG tube. Tolerating small amounts of clear liquids. Having bowel movements.   Objective: Vital signs in last 24 hours: Temp:  [97.7 F (36.5 C)-99.3 F (37.4 C)] 97.7 F (36.5 C) (04/23 0518) Pulse Rate:  [83-89] 83 (04/23 0518) Resp:  [15-18] 18 (04/23 0518) BP: (102-134)/(71-84) 134/84 (04/23 0518) SpO2:  [96 %-100 %] 99 % (04/23 0518) Last BM Date: 02/04/21  Intake/Output from previous day: 04/22 0701 - 04/23 0700 In: 4732 [P.O.:540; I.V.:3921.1; IV Piggyback:270.8] Out: 1825 [Urine:1825] Intake/Output this shift: No intake/output data recorded.  General appearance: resting comfortably Resp: normal work of breathing  GI: mildly distended but soft and nontender to palpation. Midline incision clean and dry with staples in place.   Lab Results:   Recent Labs    02/04/21 0400  WBC 12.1*  HGB 8.8*  HCT 27.6*  PLT 396   BMET Recent Labs    02/04/21 0400 02/05/21 0310  NA 146* 137  K 4.2 4.1  CL 112* 107  CO2 24 22  GLUCOSE 118* 119*  BUN 14 16  CREATININE 1.01 0.68  CALCIUM 9.1 8.8*   PT/INR No results for input(s): LABPROT, INR in the last 72 hours. ABG No results for input(s): PHART, HCO3 in the last 72 hours.  Invalid input(s): PCO2, PO2  Studies/Results: No results found.  Assessment/Plan: s/p Procedure(s): LAPAROSCOPIC RIGHT COLECTOMY (N/A)   Recentleft ICA and left MCA/M2 infarctions/p mechanical thrombectomy and left carotid stenting and angioplasty 12/13/20 on aspirin and brillinta - Aspirin suppository. Ok to resume Brilinta if indicated now that patient is tolerating PO.  POD11, s/p lap converted to open R hemicolectomy for partially obstructing tumor in the cecum, Dr. [name] 01/25/21 -surgical path: Invasive colorectal adenocarcinoma, 4.4 cm, negative margins, 0/28 nodes. - Patient has had prolonged ileus, now seems to be resolving. Advance to full  liquid diet today. - Continue TPN until PO intake is adequate. - OOB, mobilize   LOS: 17 days   Michaelle Birks, MD [facility] Surgery General, Hepatobiliary and Pancreatic Surgery 02/05/21 8:58 AM

## 2021-02-05 NOTE — Progress Notes (Signed)
PROGRESS NOTE    Dustin Golden  SWN:[id number] DOB: 04-29-1956 DOA: 01/17/2021 PCP: [name], PA   Brief Narrative: [name] with PMH of recent left ACA and MCA CVA s/p left ICA thrombectomy followed by vascular stenting with mild residual upper and lower extremity weakness and dysarthria on DAPT with aspirin and Brilinta presenting with BRBPR.  EGD on 4/6 with mild erythematous mucosa in duodenum.  Colonoscopy on 4/7 with ulcerated cecal mass.  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  Pathology with high-grade dysplasia.   Patient underwent laparotomy with right hemicolectomy on 01/25/2021.  Surgical pathology with stage II adenocarcinoma.  Postop course complicated by protracted ileus requiring NGT.  Now on TPN.   Assessment & Plan:   Principal Problem:   GIB (gastrointestinal bleeding) Active Problems:   Left middle cerebral artery stroke [facility])   Essential hypertension   Hyperlipidemia   Acute GI bleeding   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  .   NG tube was pulled out on 02/04/2021.   Moving bowels no nausea vomiting reported surgery advancing diet to full liquids today.  TPN to be continued.  #2 recent left ACA MCA stroke status post left ICA thrombectomy followed by stenting to the left ICA with mild right upper extremity and left lower extremity weakness and dysarthria.  Prior attending discussed with neurologist and had recommended to downgrade to aspirin.  The aspirin suppository and start aspirin 81 mg daily p.o. Discussed with neurology, Dr. [name] about DAPT. He discussed with interventional radiologist.  Since patient is over 6 weeks out after stent placement, it is okay to de-escalate to  aspirin 81 mg daily.    #3 AKI resolved with IV fluids.  #4 Hypertension continue as needed hydralazine and labetalol.  #5 hypokalemia -resolved   Nutrition Problem: Increased nutrient needs Etiology: post-op healing     Signs/Symptoms: estimated needs    Interventions: TPN  Estimated body mass index is 24.03 kg/m as calculated from the following:   Height as of this encounter: 6\' 3"  (1.905 m).   Weight as of this encounter: 87.2 kg.  DVT prophylaxis:scd Code Status: Full code Family Communication: Patient's daughter was on the phone and speaker while I was in the room Disposition Plan:  Status is: Inpatient  Dispo: The patient is from: Home              Anticipated d/c is to: Home              Patient currently is not medically stable to d/c.   Difficult to place patient No    Consultants:   General surgery  Procedures: 01/19/2021-EGD 01/20/2021 colonoscopy with fungating mass in the cecum 01/25/2021 laparotomy and right hemicolectomy pathology with stage II adenocarcinoma the colon Antimicrobials: None  Subjective: Reports moving bowels no nausea vomiting anxious to eat  Objective: Vitals:   02/04/21 1358 02/04/21 2104 02/05/21 0518 02/05/21 1406  BP: 102/76 117/84 134/84 120/83  Pulse: 89 89 83 78  Resp: 17 15 18 17   Temp: 98.8 F (37.1 C) 98.2 F (36.8 C) 97.7 F (36.5 C) 98.2 F (36.8 C)  TempSrc: Oral Oral Oral   SpO2: 100% 97% 99% 100%  Weight:  Height:        Intake/Output Summary (Last 24 hours) at 02/05/2021 1424 Last data filed at 02/05/2021 1410 Gross per 24 hour  Intake 2786.66 ml  Output 2325 ml  Net 461.66 ml   Filed Weights   01/17/21 1352 01/20/21 1135  Weight: 87.2 kg 87.2 kg    Examination:  General exam: Appears calm and comfortable  Respiratory system: Clear to auscultation. Respiratory effort normal. Cardiovascular system: S1 & S2 heard, RRR. No JVD, murmurs, rubs, gallops or clicks. No pedal  edema. Gastrointestinal system: Abdomen is  Decreased distension, soft and tender. No organomegaly or masses felt. Normal bowel sounds heard. Central nervous system: Alert and oriented. No focal neurological deficits. Extremities: Symmetric 5 x 5 power. Skin: No rashes, lesions or ulcers Psychiatry: Judgement and insight appear normal. Mood & affect appropriate.     Data Reviewed: I have personally reviewed following labs and imaging studies  CBC: Recent Labs  Lab 01/30/21 0539 01/31/21 0419 02/01/21 0032 02/02/21 0333 02/04/21 0400  WBC 13.2* 12.1* 10.5 10.6* 12.1*  NEUTROABS  --   --   --  7.6  --   HGB 10.2* 9.2* 9.5* 9.3* 8.8*  HCT 31.9* 27.9* 29.1* 27.9* 27.6*  MCV 80.4 79.5* 79.5* 79.0* 80.5  PLT 403* 334 367 386 510   Basic Metabolic Panel: Recent Labs  Lab 02/01/21 0032 02/02/21 0333 02/03/21 0321 02/04/21 0400 02/05/21 0310  NA 141 144 143 146* 137  K 3.8 3.1* 3.7 4.2 4.1  CL 108 107 109 112* 107  CO2 22 27 25 24 22   GLUCOSE 116* 127* 131* 118* 119*  BUN 11 12 11 14 16   CREATININE 0.97 0.96 0.99 1.01 0.68  CALCIUM 8.8* 8.6* 8.6* 9.1 8.8*  MG 2.2 2.4 2.2 2.2 2.1  PHOS 4.2 4.4 4.0 4.4 4.1   GFR: Estimated Creatinine Clearance: 111.5 mL/min (by C-G formula based on SCr of 0.68 mg/dL). Liver Function Tests: Recent Labs  Lab 02/01/21 0032 02/02/21 0333 02/03/21 0321  AST 32 17 16  ALT 57* 39 31  ALKPHOS 130* 119 107  BILITOT 0.7 0.4 0.3  PROT 7.0 6.7 6.8  ALBUMIN 3.3* 3.2* 3.0*   No results for input(s): LIPASE, AMYLASE in the last 168 hours. No results for input(s): AMMONIA in the last 168 hours. Coagulation Profile: No results for input(s): INR, PROTIME in the last 168 hours. Cardiac Enzymes: No results for input(s): CKTOTAL, CKMB, CKMBINDEX, TROPONINI in the last 168 hours. BNP (last 3 results) No results for input(s): PROBNP in the last 8760 hours. HbA1C: No results for input(s): HGBA1C in the last 72 hours. CBG: Recent Labs  Lab  02/04/21 2353 02/05/21 0520 02/05/21 0803 02/05/21 1157 02/05/21 1402  GLUCAP 124* 126* 105* 135* 112*   Lipid Profile: No results for input(s): CHOL, HDL, LDLCALC, TRIG, CHOLHDL, LDLDIRECT in the last 72 hours. Thyroid Function Tests: No results for input(s): TSH, T4TOTAL, FREET4, T3FREE, THYROIDAB in the last 72 hours. Anemia Panel: No results for input(s): VITAMINB12, FOLATE, FERRITIN, TIBC, IRON, RETICCTPCT in the last 72 hours. Sepsis Labs: No results for input(s): PROCALCITON, LATICACIDVEN in the last 168 hours.  No results found for this or any previous visit (from the past 240 hour(s)).       Radiology Studies: No results found.      Scheduled Meds: . aspirin  150 mg Rectal Daily  . bupivacaine liposome  20 mL Infiltration Once  . Chlorhexidine Gluconate Cloth  6 each Topical Daily  . insulin  aspart  0-9 Units Subcutaneous Q8H  . lidocaine  1 patch Transdermal Q24H  . lip balm  1 application Topical BID  . metoprolol tartrate  2.5 mg Intravenous Q6H  . ondansetron (ZOFRAN) IV  4 mg Intravenous Q6H  . pantoprazole (PROTONIX) IV  40 mg Intravenous Q24H  . sodium chloride flush  10-40 mL Intracatheter Q12H   Continuous Infusions: . chlorproMAZINE (THORAZINE) IV 25 mg (02/01/21 1739)  . dextrose 5 % and 0.45% NaCl 20 mL/hr at 02/04/21 1724  . methocarbamol (ROBAXIN) IV 500 mg (02/05/21 1329)  . TPN ADULT (ION) 90 mL/hr at 02/04/21 1721  . TPN ADULT (ION)       LOS: 17 days    Georgette Shell, MD 02/05/2021, 2:24 PM

## 2021-02-05 NOTE — Progress Notes (Signed)
PHARMACY - TOTAL PARENTERAL NUTRITION CONSULT NOTE   Indication: Prolonged ileus  Patient Measurements: Height: _0  (190.5 cm) Weight: 87.2 kg (192 lb 3.9 oz) IBW/kg (Calculated) : 84.5 TPN AdjBW (KG): 87.2 Body mass index is 24.03 kg/m.  Assessment: 23 y/oM with PMH of recent left ACA and MCA CVA s/p left ICA thrombectomy followed by vascular stenting on DAPT presented 4/4 with BRBPR. Colonoscopy on 4/7 with ulcerated cecal mass. 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 Patient underwent exploratory laparotomy, open right hemicolectomy on 4/12. Postop course complicated by ileus. Pharmacy consulted for TPN.   Note patient reports incontinence of stool overnight 4/21-22, had 4 BMs 4/21.   Glucose / Insulin: no hx of DM. CBGs at goal < 150. 3 units insulin received via SSI.   Electrolytes: WNL Renal: SCr 0.68, BUN 16 Hepatic: AST/ALT, Alk Phos, Tbili WNL.  Triglycerides: 113 (4/20) Albumin/Prealbumin: Albumin 3, Prealbumin 22.2 (4/12), 13.7 (4/19) Intake / Output; MIVF: 2128m UOP,NGT pulled 4/22; I/O+26050m D5 1/2 NS at 204mr GI Imaging: 4/17 CT abd/pelvis: Changes consistent with postoperative ileus with a transition zone in the mid ileum. The stomach is well distended with fluid and the postoperative ileus extends throughout the proximal small bowel. GI Surgeries / Procedures: 4/12 exploratory laparotomy, open right hemicolectomy for partially obstructing tumor in the cecum 4/22: CCS pulled NGT with patient having BMs, allow ice chips and sips and monitor with NG out  Central access: PICC placed 4/19 TPN start date: 4/19  Nutritional Goals (per RD recommendation on 4/19): kCal: 2200-2400, Protein: 110-125g, Fluid: 2.2L/day  Goal TPN rate is 59m65m (provides 123g of protein and 2264 kcals per day)  Current Nutrition:  Full liquid ordered 4/23  Plan: At 1800:   Per surgery, continue TPN until adequate PO intake; will  f/u tolerance to diet  Continue TPN at goal 59mL60m  Electrolytes in TPN: Na 35mEq64mK 45mEq/46ma 5mEq/L,24m 2.5mEq/L, 69m Phos 5mmol/L. 13mAc 1:1  Add standard MVI and trace elements to TPN  Continue maintenance IV fluids at KVO  DecreSpray8 hours and adjust as needed   Monitor TPN labs on Mon/Thurs  BMET, Mg, and phos in AM   Rondall Radigan, SUlice [id number] 10:33 AM

## 2021-02-06 LAB — BASIC METABOLIC PANEL
Anion gap: 10 (ref 5–15)
BUN: 14 mg/dL (ref 8–23)
CO2: 22 mmol/L (ref 22–32)
Calcium: 9.1 mg/dL (ref 8.9–10.3)
Chloride: 106 mmol/L (ref 98–111)
Creatinine, Ser: 0.88 mg/dL (ref 0.61–1.24)
GFR, Estimated: >60 mL/min (ref >60)
Glucose, Bld: 120 mg/dL — ABNORMAL HIGH (ref 70–99)
Potassium: 4.3 mmol/L (ref 3.5–5.1)
Sodium: 138 mmol/L (ref 135–145)

## 2021-02-06 LAB — GLUCOSE, CAPILLARY
Glucose-Capillary: 136 mg/dL — ABNORMAL HIGH (ref 70–99)
Glucose-Capillary: 108 mg/dL — ABNORMAL HIGH (ref 70–99)
Glucose-Capillary: 115 mg/dL — ABNORMAL HIGH (ref 70–99)

## 2021-02-06 LAB — PHOSPHORUS: Phosphorus: 4.3 mg/dL (ref 2.5–4.6)

## 2021-02-06 LAB — MAGNESIUM: Magnesium: 1.9 mg/dL (ref 1.7–2.4)

## 2021-02-06 MED ORDER — TRAVASOL 10 % IV SOLN: INTRAVENOUS | Status: DC

## 2021-02-06 NOTE — Progress Notes (Signed)
PHARMACY - TOTAL PARENTERAL NUTRITION CONSULT NOTE   Indication: Prolonged ileus  Patient Measurements: Height: 6\' 3"  (190.5 cm) Weight: 87.2 kg (192 lb 3.9 oz) IBW/kg (Calculated) : 84.5 TPN AdjBW (KG): 87.2 Body mass index is 24.03 kg/m.  Assessment: 83 y/oM with PMH of recent left ACA and MCA CVA s/p left ICA thrombectomy followed by vascular stenting on DAPT presented 4/4 with BRBPR. Colonoscopy on 4/7 with ulcerated cecal mass. 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 Patient underwent exploratory laparotomy, open right hemicolectomy on 4/12. Postop course complicated by ileus. Pharmacy consulted for TPN.   02/06/21 9:50 AM - tolerated full liquid diet well today and advancing to soft diet today - ambulating per RN  Central access: PICC placed 4/19 TPN start date: 4/19  Nutritional Goals (per RD recommendation on 4/19): kCal: 2200-2400, Protein: 110-125g, Fluid: 2.2L/day  Goal TPN rate is 21mL/hr (provides 123g of protein and 2264 kcals per day)  Current Nutrition:  Full liquid ordered 4/23>> soft diet 4/24  Plan:  Plan is to wean TPN off  Will cut TPN in half for the remainder of the day and stop after today  Spoke to Dr. Ninfa Linden who is in agreement with this plan   Napoleon Form  02/06/2021 9:50 AM

## 2021-02-06 NOTE — Progress Notes (Signed)
12 Days Post-Op   Subjective/Chief Complaint: Tolerated full liquids yesterday without bloating or nausea Having BM's   Objective: Vital signs in last 24 hours: Temp:  [98.2 F (36.8 C)-98.4 F (36.9 C)] 98.3 F (36.8 C) (04/24 0648) Pulse Rate:  [71-78] 71 (04/24 0648) Resp:  [15-18] 18 (04/24 0648) BP: (108-120)/(72-83) 108/72 (04/24 0648) SpO2:  [99 %-100 %] 100 % (04/24 0648) Last BM Date: 02/04/21  Intake/Output from previous day: 04/23 0701 - 04/24 0700 In: 3526 [P.O.:830; I.V.:2516.8; IV Piggyback:179.2] Out: 1700 [Urine:1700] Intake/Output this shift: No intake/output data recorded.  Exam: Awake and alert Abdomen soft, minimally full, incisions clean  Lab Results:  Recent Labs    02/04/21 0400  WBC 12.1*  HGB 8.8*  HCT 27.6*  PLT 396   BMET Recent Labs    02/05/21 0310 02/06/21 0416  NA 137 138  K 4.1 4.3  CL 107 106  CO2 22 22  GLUCOSE 119* 120*  BUN 16 14  CREATININE 0.68 0.88  CALCIUM 8.8* 9.1   PT/INR No results for input(s): LABPROT, INR in the last 72 hours. ABG No results for input(s): PHART, HCO3 in the last 72 hours.  Invalid input(s): PCO2, PO2  Studies/Results: No results found.  Anti-infectives: Anti-infectives (From admission, onward)   Start     Dose/Rate Route Frequency Ordered Stop   01/25/21 0600  cefoTEtan (CEFOTAN) 2 g in sodium chloride 0.9 % 100 mL IVPB        2 g 200 mL/hr over 30 Minutes Intravenous On call to O.R. 01/24/21 1405 01/25/21 1110   01/24/21 0600  cefoTEtan (CEFOTAN) 2 g in sodium chloride 0.9 % 100 mL IVPB  Status:  Discontinued        2 g 200 mL/hr over 30 Minutes Intravenous On call to O.R. 01/22/21 0821 01/24/21 1406   01/23/21 1400  neomycin (MYCIFRADIN) tablet 1,000 mg       "And" Linked Group Details   1,000 mg Oral 3 times per day 01/22/21 0821 01/23/21 2233   01/23/21 1400  metroNIDAZOLE (FLAGYL) tablet 1,000 mg       "And" Linked Group Details   1,000 mg Oral 3 times per day 01/22/21 0821  01/23/21 2233      Assessment/Plan: s/p Procedure(s): LAPAROSCOPIC RIGHT COLECTOMY (N/A)   POD#12 Ileus improving Will try soft diet today and ween TPN off   LOS: 18 days    Coralie Keens 02/06/2021

## 2021-02-06 NOTE — Progress Notes (Signed)
PROGRESS NOTE    Dustin Golden  NWG:[id number] DOB: [birth year] DOA: 01/17/2021 PCP: [name], PA   Brief Narrative: [name] with PMH of recent left ACA and MCA CVA s/p left ICA thrombectomy followed by vascular stenting with mild residual upper and lower extremity weakness and dysarthria on DAPT with aspirin and Brilinta presenting with BRBPR.  EGD on 4/6 with mild erythematous mucosa in duodenum.  Colonoscopy on 4/7 with ulcerated cecal mass.  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  Pathology with high-grade dysplasia.   Patient underwent laparotomy with right hemicolectomy on 01/25/2021.  Surgical pathology with stage II adenocarcinoma.  Postop course complicated by protracted ileus requiring NGT.  Now on TPN.   Assessment & Plan:   Principal Problem:   GIB (gastrointestinal bleeding) Active Problems:   Left middle cerebral artery stroke [facility])   Essential hypertension   Hyperlipidemia   Acute GI bleeding   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  .   NG tube was pulled out on 02/04/2021.   Moving bowels no nausea vomiting reported surgery advancing diet to full liquids today.  TPN to be discontinued today and advancing diet  #2 recent left ACA MCA stroke status post left ICA thrombectomy followed by stenting to the left ICA with mild right upper extremity and left lower extremity weakness and dysarthria.  Prior attending discussed with neurologist and had recommended to downgrade to aspirin.  The aspirin suppository and start aspirin 81 mg daily p.o. Discussed with neurology, Dr. [name] about DAPT. He discussed with interventional radiologist.  Since patient is over 6 weeks out after stent placement,  it is okay to de-escalate to aspirin 81 mg daily.    #3 AKI resolved with IV fluids.  #4 Hypertension continue as needed hydralazine and labetalol.  #5 hypokalemia -resolved   Nutrition Problem: Increased nutrient needs Etiology: post-op healing     Signs/Symptoms: estimated needs    Interventions: TPN  Estimated body mass index is 24.03 kg/m as calculated from the following:   Height as of this encounter: 6\' 3"  (1.905 m).   Weight as of this encounter: 87.2 kg.  DVT prophylaxis:scd Code Status: Full code Family Communication: Patient's daughter was on the phone and speaker while I was in the room Disposition Plan:  Status is: Inpatient  Dispo: The patient is from: Home              Anticipated d/c is to: Home              Patient currently is not medically stable to d/c.   Difficult to place patient No    Consultants:   General surgery  Procedures: 01/19/2021-EGD 01/20/2021 colonoscopy with fungating mass in the cecum 01/25/2021 laparotomy and right hemicolectomy pathology with stage II adenocarcinoma the colon Antimicrobials: None  Subjective: Feels better having bowel movements surgery is advancing his diet and discontinuing TPN today  Objective: Vitals:   02/05/21 0518 02/05/21 1406 02/05/21 2137 02/06/21 0648  BP: 134/84 120/83 114/82 108/72  Pulse: 83 78 78 71  Resp: 18 17 15 18   Temp: 97.7 F (36.5 C) 98.2 F (36.8 C) 98.4 F (36.9 C) 98.3 F (36.8 C)  TempSrc: Oral  Oral Oral  SpO2: 99% 100% 99% 100%  Weight:      Height:        Intake/Output Summary (Last 24 hours) at 02/06/2021 1227 Last data filed at 02/06/2021 1000 Gross per 24 hour  Intake 3255.96 ml  Output 1450 ml  Net 1805.96 ml   Filed Weights   01/17/21 1352 01/20/21 1135  Weight: 87.2 kg 87.2 kg    Examination:  General exam: Appears calm and comfortable  Respiratory system: Clear to auscultation. Respiratory effort normal. Cardiovascular system: S1 & S2 heard, RRR. No  JVD, murmurs, rubs, gallops or clicks. No pedal edema. Gastrointestinal system: Abdomen  Decreased distension, soft and tender. No organomegaly or masses felt. Normal bowel sounds heard. Central nervous system: Alert and oriented. No focal neurological deficits. Extremities: Symmetric 5 x 5 power. Skin: No rashes, lesions or ulcers Psychiatry: Judgement and insight appear normal. Mood & affect appropriate.     Data Reviewed: I have personally reviewed following labs and imaging studies  CBC: Recent Labs  Lab 01/31/21 0419 02/01/21 0032 02/02/21 0333 02/04/21 0400  WBC 12.1* 10.5 10.6* 12.1*  NEUTROABS  --   --  7.6  --   HGB 9.2* 9.5* 9.3* 8.8*  HCT 27.9* 29.1* 27.9* 27.6*  MCV 79.5* 79.5* 79.0* 80.5  PLT 334 367 386 324   Basic Metabolic Panel: Recent Labs  Lab 02/02/21 0333 02/03/21 0321 02/04/21 0400 02/05/21 0310 02/06/21 0416  NA 144 143 146* 137 138  K 3.1* 3.7 4.2 4.1 4.3  CL 107 109 112* 107 106  CO2 27 25 24 22 22   GLUCOSE 127* 131* 118* 119* 120*  BUN 12 11 14 16 14   CREATININE 0.96 0.99 1.01 0.68 0.88  CALCIUM 8.6* 8.6* 9.1 8.8* 9.1  MG 2.4 2.2 2.2 2.1 1.9  PHOS 4.4 4.0 4.4 4.1 4.3   GFR: Estimated Creatinine Clearance: 101.4 mL/min (by C-G formula based on SCr of 0.88 mg/dL). Liver Function Tests: Recent Labs  Lab 02/01/21 0032 02/02/21 0333 02/03/21 0321  AST 32 17 16  ALT 57* 39 31  ALKPHOS 130* 119 107  BILITOT 0.7 0.4 0.3  PROT 7.0 6.7 6.8  ALBUMIN 3.3* 3.2* 3.0*   No results for input(s): LIPASE, AMYLASE in the last 168 hours. No results for input(s): AMMONIA in the last 168 hours. Coagulation Profile: No results for input(s): INR, PROTIME in the last 168 hours. Cardiac Enzymes: No results for input(s): CKTOTAL, CKMB, CKMBINDEX, TROPONINI in the last 168 hours. BNP (last 3 results) No results for input(s): PROBNP in the last 8760 hours. HbA1C: No results for input(s): HGBA1C in the last 72 hours. CBG: Recent Labs  Lab  02/05/21 0803 02/05/21 1157 02/05/21 1402 02/05/21 2135 02/06/21 0639  GLUCAP 105* 135* 112* 126* 136*   Lipid Profile: No results for input(s): CHOL, HDL, LDLCALC, TRIG, CHOLHDL, LDLDIRECT in the last 72 hours. Thyroid Function Tests: No results for input(s): TSH, T4TOTAL, FREET4, T3FREE, THYROIDAB in the last 72 hours. Anemia Panel: No results for input(s): VITAMINB12, FOLATE, FERRITIN, TIBC, IRON, RETICCTPCT in the last 72 hours. Sepsis Labs: No results for input(s): PROCALCITON, LATICACIDVEN in the last 168 hours.  No results found for this or any previous visit (from the past 240 hour(s)).       Radiology Studies: No results found.      Scheduled Meds: . aspirin  81 mg Oral Daily  . bupivacaine liposome  20 mL Infiltration Once  . Chlorhexidine Gluconate Cloth  6 each Topical Daily  .  insulin aspart  0-9 Units Subcutaneous Q8H  . lidocaine  1 patch Transdermal Q24H  . lip balm  1 application Topical BID  . metoprolol tartrate  2.5 mg Intravenous Q6H  . ondansetron (ZOFRAN) IV  4 mg Intravenous Q6H  . pantoprazole (PROTONIX) IV  40 mg Intravenous Q24H  . sodium chloride flush  10-40 mL Intracatheter Q12H   Continuous Infusions: . chlorproMAZINE (THORAZINE) IV 25 mg (02/01/21 1739)  . dextrose 5 % and 0.45% NaCl 20 mL/hr at 02/06/21 0300  . methocarbamol (ROBAXIN) IV 500 mg (02/06/21 1213)     LOS: 18 days    Georgette Shell, MD 02/06/2021, 12:27 PM

## 2021-02-07 ENCOUNTER — Encounter: Payer: Self-pay | Admitting: *Deleted

## 2021-02-07 LAB — COMPREHENSIVE METABOLIC PANEL
ALT: 174 U/L — ABNORMAL HIGH (ref 0–44)
AST: 92 U/L — ABNORMAL HIGH (ref 15–41)
Albumin: 3.2 g/dL — ABNORMAL LOW (ref 3.5–5.0)
Alkaline Phosphatase: 273 U/L — ABNORMAL HIGH (ref 38–126)
Anion gap: 8 (ref 5–15)
BUN: 16 mg/dL (ref 8–23)
CO2: 23 mmol/L (ref 22–32)
Calcium: 8.9 mg/dL (ref 8.9–10.3)
Chloride: 103 mmol/L (ref 98–111)
Creatinine, Ser: 0.9 mg/dL (ref 0.61–1.24)
GFR, Estimated: >60 mL/min (ref >60)
Glucose, Bld: 110 mg/dL — ABNORMAL HIGH (ref 70–99)
Potassium: 4.3 mmol/L (ref 3.5–5.1)
Sodium: 134 mmol/L — ABNORMAL LOW (ref 135–145)
Total Bilirubin: 0.5 mg/dL (ref 0.3–1.2)
Total Protein: 7.5 g/dL (ref 6.5–8.1)

## 2021-02-07 LAB — PHOSPHORUS: Phosphorus: 4.1 mg/dL (ref 2.5–4.6)

## 2021-02-07 LAB — GLUCOSE, CAPILLARY: Glucose-Capillary: 110 mg/dL — ABNORMAL HIGH (ref 70–99)

## 2021-02-07 LAB — DIFFERENTIAL
Abs Immature Granulocytes: 0.19 10*3/uL — ABNORMAL HIGH (ref 0.00–0.07)
Basophils Absolute: 0.1 10*3/uL (ref 0.0–0.1)
Basophils Relative: 1 %
Eosinophils Absolute: 0.6 10*3/uL — ABNORMAL HIGH (ref 0.0–0.5)
Eosinophils Relative: 6 %
Immature Granulocytes: 2 %
Lymphocytes Relative: 27 %
Lymphs Abs: 3 10*3/uL (ref 0.7–4.0)
Monocytes Absolute: 0.6 10*3/uL (ref 0.1–1.0)
Monocytes Relative: 5 %
Neutro Abs: 6.5 10*3/uL (ref 1.7–7.7)
Neutrophils Relative %: 59 %

## 2021-02-07 LAB — TRIGLYCERIDES: Triglycerides: 132 mg/dL (ref <150)

## 2021-02-07 LAB — CBC
HCT: 28.3 % — ABNORMAL LOW (ref 39.0–52.0)
Hemoglobin: 9.4 g/dL — ABNORMAL LOW (ref 13.0–17.0)
MCH: 25.8 pg — ABNORMAL LOW (ref 26.0–34.0)
MCHC: 33.2 g/dL (ref 30.0–36.0)
MCV: 77.5 fL — ABNORMAL LOW (ref 80.0–100.0)
Platelets: 435 10*3/uL — ABNORMAL HIGH (ref 150–400)
RBC: 3.65 MIL/uL — ABNORMAL LOW (ref 4.22–5.81)
RDW: 13.7 % (ref 11.5–15.5)
WBC: 10.9 10*3/uL — ABNORMAL HIGH (ref 4.0–10.5)
nRBC: 0 % (ref 0.0–0.2)

## 2021-02-07 LAB — MAGNESIUM: Magnesium: 2 mg/dL (ref 1.7–2.4)

## 2021-02-07 LAB — PREALBUMIN: Prealbumin: 22.1 mg/dL (ref 18–38)

## 2021-02-07 MED ORDER — TRAMADOL HCL 50 MG PO TABS: 50.0000 mg | ORAL_TABLET | Freq: Four times a day (QID) | ORAL | 0 refills | Status: DC | PRN

## 2021-02-07 MED ORDER — ENSURE ENLIVE PO LIQD: 237.0000 mL | Freq: Two times a day (BID) | ORAL | Status: DC

## 2021-02-07 NOTE — Discharge Instructions (Signed)
[facility]      [facility] [phone]  OPEN ABDOMINAL SURGERY: POST OP INSTRUCTIONS  Always review your discharge instruction sheet given to you by the facility where your surgery was performed.  IF YOU HAVE DISABILITY OR FAMILY LEAVE FORMS, YOU MUST BRING THEM TO THE OFFICE FOR PROCESSING.  PLEASE DO NOT GIVE THEM TO YOUR DOCTOR.  1. A prescription for pain medication may be given to you upon discharge.  Take your pain medication as prescribed, if needed.  If narcotic pain medicine is not needed, then you may take acetaminophen (Tylenol) or ibuprofen (Advil) as needed. 2. Take your usually prescribed medications unless otherwise directed. 3. If you need a refill on your pain medication, please contact your pharmacy. They will contact our office to request authorization.  Prescriptions will not be filled after 5pm or on week-ends. 4. You should follow a light diet the first few days after arrival home, such as soup and crackers, pudding, etc.unless your doctor has advised otherwise. A high-fiber, low fat diet can be resumed as tolerated.   Be sure to include lots of fluids daily. Most patients will experience some swelling and bruising on the chest and neck area.  Ice packs will help.  Swelling and bruising can take several days to resolve 5. Most patients will experience some swelling and bruising in the area of the incision. Ice pack will help. Swelling and bruising can take several days to resolve..  6. It is common to experience some constipation if taking pain medication after surgery.  Increasing fluid intake and taking a stool softener will usually help or prevent this problem from occurring.  A mild laxative (Milk of Magnesia or Miralax) should be taken according to package directions if there are no bowel movements after 48 hours. 7.  You may have steri-strips (small skin tapes) in place directly over the incision.  These strips should be left on the skin for 7-10 days.  If your  surgeon used skin glue on the incision, you may shower in 24 hours.  The glue will flake off over the next 2-3 weeks.  Any sutures or staples will be removed at the office during your follow-up visit. You may find that a light gauze bandage over your incision may keep your staples from being rubbed or pulled. You may shower and replace the bandage daily. 8. ACTIVITIES:  You may resume regular (light) daily activities beginning the next day--such as daily self-care, walking, climbing stairs--gradually increasing activities as tolerated.  You may have sexual intercourse when it is comfortable.  Refrain from any heavy lifting or straining until approved by your doctor. a. You may drive when you no longer are taking prescription pain medication, you can comfortably wear a seatbelt, and you can safely maneuver your car and apply brakes b. Return to Work: ___________________________________ 9. You should see your doctor in the office for a follow-up appointment approximately two weeks after your surgery.  Make sure that you call for this appointment within a day or two after you arrive home to insure a convenient appointment time. OTHER INSTRUCTIONS:  _____________________________________________________________ _____________________________________________________________  WHEN TO CALL YOUR DOCTOR: 1. Fever over 101.0 2. Inability to urinate 3. Nausea and/or vomiting 4. Extreme swelling or bruising 5. Continued bleeding from incision. 6. Increased pain, redness, or drainage from the incision. 7. Difficulty swallowing or breathing 8. Muscle cramping or spasms. 9. Numbness or tingling in hands or feet or around lips.  The clinic staff is available to   answer your questions during regular business hours.  Please don't hesitate to call and ask to speak to one of the nurses if you have concerns.  For further questions, please visit [email or web address]   

## 2021-02-07 NOTE — Discharge Summary (Signed)
Physician Discharge Summary  Dustin Golden JOA:[id number] DOB: October 12, 1956 DOA: 01/17/2021  PCP: [name], PA  Admit date: 01/17/2021 Discharge date: 02/07/2021  Admitted From: home Disposition:  home  Recommendations for Outpatient Follow-up:  1. Follow up with PCP in 1-2 weeks 2. Please obtain BMP/CBC in one week 3. Follow-up with Dr. [name] general surgery 4. Follow-up with Dr. [name] oncology Home Health none Equipment/Devices none  Discharge Condition: Stable CODE STATUS full code  diet recommendation: Cardiac Brief/Interim Summary:[name] with PMH of recent left ACA and MCA CVA s/p left ICA thrombectomy followed by vascular stenting with mild residual upper and lower extremity weakness and dysarthria on DAPT with aspirin and Brilinta presenting with BRBPR. EGD on 4/6 with mild erythematous mucosa in duodenum. Colonoscopy on 4/7 with ulcerated cecal mass. 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 Pathology with high-grade dysplasia.   Patient underwent laparotomy with right hemicolectomy on 01/25/2021.Surgical pathology with stage II adenocarcinoma. Postop course complicated byprotractedileus requiring NGT.    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  .   NG tube was pulled out on 02/04/2021.   He started to move his bowels and was able to tolerated diet prior to discharge.  Staples were removed on the day of discharge.  He will follow-up with general surgery Dr. [name] and oncology  Dr. [name].  Discussed with his daughter.  #2 recent left ACA MCA stroke status post left ICA thrombectomy followed by stenting to the left ICA with mild right upper extremity and left lower extremity weakness and dysarthria.  Prior attending discussed with neurologist and had recommended to downgrade to aspirin. Discussed with neurology, Dr. [name] about DAPT. Hediscussed with interventional radiologist.Since patient is over 6 weeks out after stent placement, it is okay tode-escalate to aspirin 81 mg daily. #3 AKI resolved with IV fluids.  #4 Hypertension -antihypertensives were stopped on discharge as his blood pressure was soft.  Prior to admission he was on Norvasc and ACE inhibitor..  #5 hypokalemia -resolved  Nutrition Problem: Increased nutrient needs Etiology: post-op healing    Signs/Symptoms: estimated needs     Interventions: TPN  Estimated body mass index is 24.03 kg/m as calculated from the following:   Height as of this encounter: 6\' 3"  (1.905 m).   Weight as of this encounter: 87.2 kg.  Discharge Instructions  Discharge Instructions    Diet - low sodium heart healthy   Complete by: As directed    Increase activity slowly   Complete by: As directed    No wound care   Complete by: As directed      Allergies as of 02/07/2021      Reactions   Ibuprofen Other (See Comments)   Nosebleeds   Sulfa Antibiotics Rash, Other (See Comments)   "Blisters to skin with cream"   Sulfamethoxazole Rash, Other (See Comments)   "Blisters to skin with cream"      Medication List    STOP taking these medications   amLODipine 10 MG tablet  Commonly known as: NORVASC   lisinopril 2.5 MG tablet Commonly known as: ZESTRIL   ticagrelor 90 MG Tabs tablet Commonly known as: BRILINTA     TAKE these medications   acetaminophen 325 MG tablet Commonly known as: TYLENOL Take 2 tablets (650 mg total) by mouth every 4 (four) hours as needed for mild pain (or temp >  37.5 C (99.5 F)).   aspirin 81 MG chewable tablet Chew 1 tablet (81 mg total) by mouth daily.   atorvastatin 20 MG tablet Commonly known as: LIPITOR Take 1 tablet (20 mg total) by mouth daily.   folic acid 1 MG tablet Commonly known as: FOLVITE Take 1 tablet (1 mg total) by mouth daily.   traMADol 50 MG tablet Commonly known as: Ultram Take 1 tablet (50 mg total) by mouth every 6 (six) hours as needed.       Follow-up Information    [place], [place], PA Follow up.   Specialty: Internal Medicine Contact information: [place] [address] [phone]        [name], MD Follow up.   Specialty: Oncology Contact information: [place] [address] [phone]              Allergies  Allergen Reactions  . Ibuprofen Other (See Comments)    Nosebleeds   . Sulfa Antibiotics Rash and Other (See Comments)    "Blisters to skin with cream"  . Sulfamethoxazole Rash and Other (See Comments)    "Blisters to skin with cream"    Consultations: GI and general surgery.  Procedures/Studies: CT CHEST W CONTRAST  Result Date: 01/20/2021 CLINICAL DATA:  Inpatient. Colorectal cancer staging. Large ulcerated partially obstructing mass found in the cecum on colonoscopy today. EXAM: CT CHEST, ABDOMEN, AND PELVIS WITH CONTRAST TECHNIQUE: Multidetector CT imaging of the chest, abdomen and pelvis was performed following the standard protocol during bolus administration of intravenous contrast. CONTRAST:  124mL OMNIPAQUE IOHEXOL 300 MG/ML  SOLN COMPARISON:  None. FINDINGS: CT CHEST FINDINGS Cardiovascular: Normal heart size. No significant pericardial effusion/thickening. Three-vessel coronary atherosclerosis. Atherosclerotic nonaneurysmal thoracic aorta. Normal caliber pulmonary arteries. No central pulmonary emboli. Mediastinum/Nodes: No discrete thyroid nodules. Unremarkable esophagus. No pathologically enlarged  axillary, mediastinal or hilar lymph nodes. Lungs/Pleura: No pneumothorax. No pleural effusion. No acute consolidative airspace disease or lung masses. Three scattered small solid subpleural right pulmonary nodules, largest 3 mm in the right middle lobe (series 4/image 131). Musculoskeletal:  No aggressive appearing focal osseous lesions. CT ABDOMEN PELVIS FINDINGS Hepatobiliary: Normal liver with no liver mass. Normal gallbladder with no radiopaque cholelithiasis. No biliary ductal dilatation. Pancreas: Normal, with no mass or duct dilation. Spleen: Normal size. No mass. Adrenals/Urinary Tract: Normal adrenals. Normal kidneys with no hydronephrosis and no renal mass. Normal bladder. Stomach/Bowel: Normal non-distended stomach. Normal caliber small bowel with no small bowel wall thickening. Oral contrast transits to the transverse colon. Appendectomy. Irregular solid 4.5 x 2.9 x 3.2 cm polypoid mass in the cecum (series 2/image 101). Moderate diffuse colonic diverticulosis with no additional sites of definite large bowel wall thickening and no significant pericolonic fat stranding. Vascular/Lymphatic: Atherosclerotic abdominal aorta with dilated 2.8 cm infrarenal abdominal aorta. Patent portal, splenic, hepatic and renal veins. Small pericecal lymph nodes measure up to 0.4 cm short axis diameter (series 2/image 101). No pathologically enlarged lymph nodes in the abdomen or pelvis. Reproductive: Mildly enlarged prostate. Other: No pneumoperitoneum, ascites or focal fluid collection. Musculoskeletal: No aggressive  appearing focal osseous lesions. Moderate lumbar spondylosis. IMPRESSION: 1. Irregular solid 4.5 x 2.9 x 3.2 cm polypoid mass in the cecum, compatible with primary cecal malignancy. 2. Small pericecal lymph nodes measure up to 0.4 cm short axis diameter, nonspecific. 3. No findings highly suspicious for metastatic disease in the chest, abdomen or pelvis. No liver masses. 4. Three scattered small solid  subpleural right pulmonary nodules, largest 3 mm, probably benign. Initial follow-up chest CT suggested in 3 months. 5. Moderate diffuse colonic diverticulosis. 6. Mildly enlarged prostate. 7. Aortic Atherosclerosis (ICD10-I70.0). Electronically Signed   By: Ilona Sorrel M.D.   On: 01/20/2021 20:12   CT ABDOMEN PELVIS W CONTRAST  Result Date: 01/30/2021 CLINICAL DATA:  Status post right colectomy EXAM: CT ABDOMEN AND PELVIS WITH CONTRAST TECHNIQUE: Multidetector CT imaging of the abdomen and pelvis was performed using the standard protocol following bolus administration of intravenous contrast. CONTRAST:  183mL OMNIPAQUE IOHEXOL 300 MG/ML  SOLN COMPARISON:  01/20/2021 FINDINGS: Lower chest: Mild bibasilar atelectasis is noted. Hepatobiliary: No focal liver abnormality is seen. No gallstones, gallbladder wall thickening, or biliary dilatation. Pancreas: Unremarkable. No pancreatic ductal dilatation or surrounding inflammatory changes. Spleen: Normal in size without focal abnormality. Adrenals/Urinary Tract: The adrenal glands are within normal limits. Kidneys demonstrate a normal enhancement pattern bilaterally. No renal calculi are seen. Delayed images demonstrate normal excretion bilaterally. The bladder is decompressed by Foley catheter. Stomach/Bowel: Stomach is well distended with fluid. Gastric catheter has been removed in the interval. Small-bowel dilatation is noted from the level of the duodenal bulb to at least the mid ileum. The transition point is best seen in the right lower quadrant but several loops prior to the new small bowel colon anastomosis. These changes are consistent with a postoperative ileus. The colon more distally is within normal limits. Changes consistent with the prior partial colectomy and reanastomosis are noted. Vascular/Lymphatic: Aortic atherosclerosis. No enlarged abdominal or pelvic lymph nodes. Reproductive: Prostate is unremarkable. Other: No abdominal wall hernia or  abnormality. No abdominopelvic ascites. Musculoskeletal: No acute or significant osseous findings. IMPRESSION: Changes consistent with postoperative ileus with a transition zone in the mid ileum. The stomach is well distended with fluid and the postoperative ileus extends throughout the proximal small bowel. Postsurgical changes in the right colon consistent with the recent history. Electronically Signed   By: Inez Catalina M.D.   On: 01/30/2021 15:53   CT ABDOMEN PELVIS W CONTRAST  Result Date: 01/20/2021 CLINICAL DATA:  Inpatient. Colorectal cancer staging. Large ulcerated partially obstructing mass found in the cecum on colonoscopy today. EXAM: CT CHEST, ABDOMEN, AND PELVIS WITH CONTRAST TECHNIQUE: Multidetector CT imaging of the chest, abdomen and pelvis was performed following the standard protocol during bolus administration of intravenous contrast. CONTRAST:  141mL OMNIPAQUE IOHEXOL 300 MG/ML  SOLN COMPARISON:  None. FINDINGS: CT CHEST FINDINGS Cardiovascular: Normal heart size. No significant pericardial effusion/thickening. Three-vessel coronary atherosclerosis. Atherosclerotic nonaneurysmal thoracic aorta. Normal caliber pulmonary arteries. No central pulmonary emboli. Mediastinum/Nodes: No discrete thyroid nodules. Unremarkable esophagus. No pathologically enlarged axillary, mediastinal or hilar lymph nodes. Lungs/Pleura: No pneumothorax. No pleural effusion. No acute consolidative airspace disease or lung masses. Three scattered small solid subpleural right pulmonary nodules, largest 3 mm in the right middle lobe (series 4/image 131). Musculoskeletal:  No aggressive appearing focal osseous lesions. CT ABDOMEN PELVIS FINDINGS Hepatobiliary: Normal liver with no liver mass. Normal gallbladder with no radiopaque cholelithiasis. No biliary ductal dilatation. Pancreas: Normal, with no mass or duct dilation. Spleen: Normal size. No mass. Adrenals/Urinary  Tract: Normal adrenals. Normal kidneys with no  hydronephrosis and no renal mass. Normal bladder. Stomach/Bowel: Normal non-distended stomach. Normal caliber small bowel with no small bowel wall thickening. Oral contrast transits to the transverse colon. Appendectomy. Irregular solid 4.5 x 2.9 x 3.2 cm polypoid mass in the cecum (series 2/image 101). Moderate diffuse colonic diverticulosis with no additional sites of definite large bowel wall thickening and no significant pericolonic fat stranding. Vascular/Lymphatic: Atherosclerotic abdominal aorta with dilated 2.8 cm infrarenal abdominal aorta. Patent portal, splenic, hepatic and renal veins. Small pericecal lymph nodes measure up to 0.4 cm short axis diameter (series 2/image 101). No pathologically enlarged lymph nodes in the abdomen or pelvis. Reproductive: Mildly enlarged prostate. Other: No pneumoperitoneum, ascites or focal fluid collection. Musculoskeletal: No aggressive appearing focal osseous lesions. Moderate lumbar spondylosis. IMPRESSION: 1. Irregular solid 4.5 x 2.9 x 3.2 cm polypoid mass in the cecum, compatible with primary cecal malignancy. 2. Small pericecal lymph nodes measure up to 0.4 cm short axis diameter, nonspecific. 3. No findings highly suspicious for metastatic disease in the chest, abdomen or pelvis. No liver masses. 4. Three scattered small solid subpleural right pulmonary nodules, largest 3 mm, probably benign. Initial follow-up chest CT suggested in 3 months. 5. Moderate diffuse colonic diverticulosis. 6. Mildly enlarged prostate. 7. Aortic Atherosclerosis (ICD10-I70.0). Electronically Signed   By: Ilona Sorrel M.D.   On: 01/20/2021 20:12   DG Abd 2 Views  Result Date: 01/30/2021 CLINICAL DATA:  Small bowel obstruction. EXAM: ABDOMEN - 2 VIEW COMPARISON:  01/27/2021 FINDINGS: Nasogastric tube has been removed. There is persistent significant dilatation of small bowel loops. The loops measure up to 4.5 centimeters and there is now evidence for small bowel wall thickening.  Surgical clips overlie the midline of the abdomen. Small amount of free intraperitoneal air beneath the RIGHT hemidiaphragm, increased since prior study. IMPRESSION: Persistent small bowel obstruction. There is now evidence for small bowel wall thickening. Free intraperitoneal air possibly postoperative. These results were called by telephone at the time of interpretation on 01/30/2021 at 9:35 am to provider Autumn Messing III , who verbally acknowledged these results. Electronically Signed   By: Nolon Nations M.D.   On: 01/30/2021 09:58   DG Abd Portable 1V  Result Date: 02/01/2021 CLINICAL DATA:  NG tube placement. EXAM: PORTABLE ABDOMEN - 1 VIEW COMPARISON:  CT 01/30/2021.  Abdomen 01/30/2021. FINDINGS: NG tube noted with tip coiled in the stomach. Distended loops of small bowel again noted. Although ileus could present this fashion, small-bowel obstruction cannot be excluded. Free air again noted, most likely from prior surgery. Surgical staples noted over the abdomen. IMPRESSION: NG tube noted with tip coiled in stomach. 2. Distended loops of small bowel again noted. Although ileus could present this fashion, small-bowel obstruction cannot be excluded. Free air again noted, most likely from prior surgery Electronically Signed   By: [place]   On: 02/01/2021 11:11   DG Abd Portable 1V  Result Date: 01/27/2021 CLINICAL DATA:  Check gastric catheter placement EXAM: PORTABLE ABDOMEN - 1 VIEW COMPARISON:  Film from earlier in the same day. FINDINGS: Stable small bowel dilatation is noted. Gastric catheter is now noted within the stomach. No free air is seen. IMPRESSION: Gastric catheter within the stomach. Electronically Signed   By: Inez Catalina M.D.   On: 01/27/2021 13:43   DG Abd Portable 1V  Result Date: 01/27/2021 CLINICAL DATA:  Abdominal pain and rectal bleeding EXAM: PORTABLE ABDOMEN - 1 VIEW COMPARISON:  January 20, 2021  CT abdomen and pelvis FINDINGS: There are multiple loops of dilated small  bowel without appreciable air-fluid levels. No free air evident on supine examination. Lung bases are clear. There are multiple skin staples. IMPRESSION: Bowel dilatation, most likely representing postoperative ileus. A degree of bowel obstruction cannot be excluded in this circumstance. No free air. Lung bases clear. Electronically Signed   By: Lowella Grip III M.D.   On: 01/27/2021 09:45   [place] EKG SITE RITE  Result Date: 02/01/2021 If Site Rite image not attached, placement could not be confirmed due to current cardiac rhythm.   (Echo, Carotid, EGD, Colonoscopy, ERCP)    Subjective:  Patient sitting up in chair denies nausea vomiting tolerating p.o. intake having bowel movements.    Discharge Exam: Vitals:   02/07/21 0003 02/07/21 0551  BP: 116/75 105/72  Pulse: 80 83  Resp:  18  Temp:  (!) 97.5 F (36.4 C)  SpO2:  98%   Vitals:   02/06/21 1321 02/06/21 2141 02/07/21 0003 02/07/21 0551  BP: 106/76 111/74 116/75 105/72  Pulse: 77 87 80 83  Resp: 17 18  18   Temp: (!) 97.5 F (36.4 C) 97.9 F (36.6 C)  (!) 97.5 F (36.4 C)  TempSrc: Oral   Oral  SpO2: 95% 96%  98%  Weight:      Height:        General: Pt is alert, awake, not in acute distress Cardiovascular: RRR, S1/S2 +, no rubs, no gallops Respiratory: CTA bilaterally, no wheezing, no rhonchi Abdominal: Soft, NT, ND, bowel sounds + Extremities: no edema, no cyanosis    The results of significant diagnostics from this hospitalization (including imaging, microbiology, ancillary and laboratory) are listed below for reference.     Microbiology: No results found for this or any previous visit (from the past 240 hour(s)).   Labs: BNP (last 3 results) No results for input(s): BNP in the last 8760 hours. Basic Metabolic Panel: Recent Labs  Lab 02/03/21 0321 02/04/21 0400 02/05/21 0310 02/06/21 0416 02/07/21 0622  NA 143 146* 137 138 134*  K 3.7 4.2 4.1 4.3 4.3  CL 109 112* 107 106 103  CO2 25 24 22 22  23   GLUCOSE 131* 118* 119* 120* 110*  BUN 11 14 16 14 16   CREATININE 0.99 1.01 0.68 0.88 0.90  CALCIUM 8.6* 9.1 8.8* 9.1 8.9  MG 2.2 2.2 2.1 1.9 2.0  PHOS 4.0 4.4 4.1 4.3 4.1   Liver Function Tests: Recent Labs  Lab 02/01/21 0032 02/02/21 0333 02/03/21 0321 02/07/21 0622  AST 32 17 16 92*  ALT 57* 39 31 174*  ALKPHOS 130* 119 107 273*  BILITOT 0.7 0.4 0.3 0.5  PROT 7.0 6.7 6.8 7.5  ALBUMIN 3.3* 3.2* 3.0* 3.2*   No results for input(s): LIPASE, AMYLASE in the last 168 hours. No results for input(s): AMMONIA in the last 168 hours. CBC: Recent Labs  Lab 02/01/21 0032 02/02/21 0333 02/04/21 0400 02/07/21 0622  WBC 10.5 10.6* 12.1* 10.9*  NEUTROABS  --  7.6  --  6.5  HGB 9.5* 9.3* 8.8* 9.4*  HCT 29.1* 27.9* 27.6* 28.3*  MCV 79.5* 79.0* 80.5 77.5*  PLT 367 386 396 435*   Cardiac Enzymes: No results for input(s): CKTOTAL, CKMB, CKMBINDEX, TROPONINI in the last 168 hours. BNP: Invalid input(s): POCBNP CBG: Recent Labs  Lab 02/05/21 2135 02/06/21 0639 02/06/21 1318 02/06/21 2144 02/07/21 0554  GLUCAP 126* 136* 108* 115* 110*   D-Dimer No results for input(s): DDIMER in  the last 72 hours. Hgb A1c No results for input(s): HGBA1C in the last 72 hours. Lipid Profile Recent Labs    02/07/21 0622  TRIG 132   Thyroid function studies No results for input(s): TSH, T4TOTAL, T3FREE, THYROIDAB in the last 72 hours.  Invalid input(s): FREET3 Anemia work up No results for input(s): VITAMINB12, FOLATE, FERRITIN, TIBC, IRON, RETICCTPCT in the last 72 hours. Urinalysis    Component Value Date/Time   COLORURINE STRAW (A) 12/13/2020 1245   APPEARANCEUR CLEAR 12/13/2020 1245   LABSPEC 1.019 12/13/2020 1245   PHURINE 7.0 12/13/2020 1245   GLUCOSEU NEGATIVE 12/13/2020 1245   HGBUR NEGATIVE 12/13/2020 1245   [place] 12/13/2020 1245   KETONESUR NEGATIVE 12/13/2020 1245   PROTEINUR NEGATIVE 12/13/2020 1245   NITRITE NEGATIVE 12/13/2020 1245   LEUKOCYTESUR  NEGATIVE 12/13/2020 1245   Sepsis Labs Invalid input(s): PROCALCITONIN,  WBC,  LACTICIDVEN Microbiology No results found for this or any previous visit (from the past 240 hour(s)).   Time coordinating discharge:  39 minutes  SIGNED:   Georgette Shell, MD  [facility] 02/07/2021, 10:58 AM

## 2021-02-07 NOTE — Progress Notes (Signed)
  Speech Language Pathology Treatment: Dysphagia  Patient Details Name: Dustin Golden MRN: [id number] DOB: September 13, 1956 Today's Date: 02/07/2021 Time: 3358-2518 SLP Time Calculation (min) (ACUTE ONLY): 15 min  Assessment / Plan / Recommendation Clinical Impression  Patient seen to address dysphagia goals. Upon SLP entering room, patient had just started eating his breakfast meal. He denied having any significant difficulties and feels that the sensation and movement of his right side facial musculature has improved and he is not noticing as much drooling. SLP observed very minimal frequency or intensity of anterior leakage of saliva on right side of mouth. He also demonstrated mild decreased mastication and oral control of regular solids boluses on right side of mouth but he is self-monitoring and managing with modified independence. SLP provided patient with information regarding GI soft diet and advised him to consult with MD prior to discharge to determine if he would need to continue with that diet upon discharge home. No further SLP intervention needed as patient is modified independent with swallow precautions and not exhibiting overt s/s of pharyngeal phase swallow impairment.    HPI HPI: Pt admitted with melena and anemia.  Colonoscopy on 4/7 with ulcerated cecal mass.  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  Pathology with high-grade dysplasia.  General surgery consulted   s/p lap converted to open R hemicolectomy for Partially obstructing tumor in the cecum per  Dr. [name] on  01/25/21. PMHx: recent CVA with residual mild R side weakness, mild right side inattention/awarenss and dysarthria      SLP Plan  Discharge SLP treatment due to (comment) (goals met)       Recommendations  Diet recommendations: Regular;Thin liquid Liquids provided via: Cup;Straw Medication Administration: Whole meds with liquid Supervision:  Patient able to self feed Compensations: Lingual sweep for clearance of pocketing Postural Changes and/or Swallow Maneuvers: Seated upright 90 degrees                Oral Care Recommendations: Oral care BID;Patient independent with oral care Follow up Recommendations: None SLP Visit Diagnosis: Dysphagia, unspecified (R13.10) Plan: Discharge SLP treatment due to (comment) (goals met)       GO             Sonia Baller, MA, CCC-SLP Speech Therapy

## 2021-02-07 NOTE — Progress Notes (Signed)
Arrived to patient's room. Instructed on removing PICC line. HOB less than 45*. Patient held breath with line removal. Pressure held for 72min. With no s/sx of bleeding. Instructed to keep drsg CDI for 24 hours. Instructed to report any bleeding at site. VU. Fran Lowes, RN VAST

## 2021-02-07 NOTE — Progress Notes (Addendum)
13 Days Post-Op  Subjective: CC: Patient reports he is doing well.  No abdominal pain.  No as needed pain medications taken yesterday.  Had some mild nausea today but reports he tolerated 1/4 dinner and all of breakfast.  Last BM yesterday. Reports he did 6 laps yesterday. Voiding.   Objective: Vital signs in last 24 hours: Temp:  [97.5 F (36.4 C)-97.9 F (36.6 C)] 97.5 F (36.4 C) (04/25 0551) Pulse Rate:  [77-87] 83 (04/25 0551) Resp:  [17-18] 18 (04/25 0551) BP: (105-116)/(72-76) 105/72 (04/25 0551) SpO2:  [95 %-98 %] 98 % (04/25 0551) Last BM Date: 02/06/21  Intake/Output from previous day: 04/24 0701 - 04/25 0700 In: 1679.3 [P.O.:540; I.V.:439.3; IV Piggyback:700] Out: 1450 [Urine:1450] Intake/Output this shift: No intake/output data recorded.  PE: Gen:  Alert, NAD, pleasant Card:  Reg Pulm:  Normal rate and effort  Abd: Soft, ND, minimal tenderness around midline incision that appears appropriate in post operative setting. +BS. Laparoscopic and midline incision with staples in place that are c/d/i. No signs of infection.  Ext:  No LE edema  Skin: no rashes noted, warm and dry   Lab Results:  Recent Labs    02/07/21 0622  WBC 10.9*  HGB 9.4*  HCT 28.3*  PLT 435*   BMET Recent Labs    02/06/21 0416 02/07/21 0622  NA 138 134*  K 4.3 4.3  CL 106 103  CO2 22 23  GLUCOSE 120* 110*  BUN 14 16  CREATININE 0.88 0.90  CALCIUM 9.1 8.9   PT/INR No results for input(s): LABPROT, INR in the last 72 hours. CMP     Component Value Date/Time   NA 134 (L) 02/07/2021 0622   K 4.3 02/07/2021 0622   CL 103 02/07/2021 0622   CO2 23 02/07/2021 0622   GLUCOSE 110 (H) 02/07/2021 0622   BUN 16 02/07/2021 0622   CREATININE 0.90 02/07/2021 0622   CALCIUM 8.9 02/07/2021 0622   PROT 7.5 02/07/2021 0622   ALBUMIN 3.2 (L) 02/07/2021 0622   AST 92 (H) 02/07/2021 0622   ALT 174 (H) 02/07/2021 0622   ALKPHOS 273 (H) 02/07/2021 0622   BILITOT 0.5 02/07/2021 0622    GFRNONAA >60 02/07/2021 0622   Lipase  No results found for: LIPASE     Studies/Results: No results found.  Anti-infectives: Anti-infectives (From admission, onward)   Start     Dose/Rate Route Frequency Ordered Stop   01/25/21 0600  cefoTEtan (CEFOTAN) 2 g in sodium chloride 0.9 % 100 mL IVPB        2 g 200 mL/hr over 30 Minutes Intravenous On call to O.R. 01/24/21 1405 01/25/21 1110   01/24/21 0600  cefoTEtan (CEFOTAN) 2 g in sodium chloride 0.9 % 100 mL IVPB  Status:  Discontinued        2 g 200 mL/hr over 30 Minutes Intravenous On call to O.R. 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 Per [facility] note. They discussed with Neurology who discussed with IR. Patient can de-escalate to ASA 81mg .   POD13, s/p lap converted to open R hemicolectomy for partially obstructing tumor in the cecum, Dr. [name] 01/25/21 -Surgical path: Invasive colorectal adenocarcinoma, 4.4 cm, negative margins, 0/28 nodes (pT3, pN0). CEA 15.2. CT chest without obvious metasasis. There is 3 scattered solid subpleural right pulmonary nodules.  Will need oncology follow up.  - Tolerating soft diet. Off TPN.   - OOB, mobilize. Cleared by PT. Plans to stay with his girlfriend of 17 years who is a Therapist, sports - Pulm toilet  - Will need staples out prior to discharge.   FEN - Soft diet. TPN off. IVF VTE - SCDs, ASA. Okay for chemical prophylaxis from a general surgery standpoint ID - None currently   Plan: Would like to ensure that  patient tolerates lunch and is eating an adequate amount. Will add ensure. If tolerating diet and eating adequate amount he will be okay for discharge from our standpoint.  Staples will need to come out prior to discharge. He will need oncology follow up as well as follow up with our office.    LOS: 19 days    Jillyn Ledger , [facility] 02/07/2021, 8:53 AM Please see Amion for pager number during day hours 7:00am-4:30pm

## 2021-02-07 NOTE — Progress Notes (Signed)
Staples removed as ordered, discharge instructions discussed with patient and family, verbalized agreement and understanding

## 2021-02-07 NOTE — Progress Notes (Signed)
Request received for Dr [name] to follow up with this patient post hospitalization for new diagnosis colon cancer. Appointment made so that it would be present on AVS at discharge from hospital today. Will call patient on Wednesday for introductory phone call and provide info regarding our office.  Oncology Nurse Navigator Documentation  Oncology Nurse Navigator Flowsheets 02/07/2021  Abnormal Finding Date 01/20/2021  Confirmed Diagnosis Date 01/25/2021  Diagnosis Status Confirmed Diagnosis Complete  Navigator Follow Up Date: 02/09/2021  Navigator Follow Up Reason: Appointment Review  Navigator Location [facility]  Referral Date to RadOnc/MedOnc 02/07/2021  Navigator Encounter Type Appt/Treatment Plan Review  Treatment Initiated Date 01/25/2021  Patient Visit Type MedOnc  Treatment Phase Active Tx  Barriers/Navigation Needs Coordination of Care  Interventions Coordination of Care  Acuity Level 2-Minimal Needs (1-2 Barriers Identified)  Coordination of Care Appts  Time Spent with Patient 30

## 2021-02-07 NOTE — Progress Notes (Signed)
Follow up arranged. Our office will contact the patient with follow up appointment time.  Discussed with RN. Staples removed prior to discharge. He has follow up appointment that was already made to see oncology.

## 2021-02-09 ENCOUNTER — Encounter: Payer: Self-pay | Admitting: *Deleted

## 2021-02-09 NOTE — Progress Notes (Signed)
Reached out to Kathe Mariner to introduce myself as the office RN Navigator and explain our new patient process. Reviewed the reason for their referral and scheduled their new patient appointment along with labs. Provided address and directions to the office including call back phone number. Reviewed with patient any concerns they may have or any possible barriers to attending their appointment.   Informed patient about my role as a navigator and that I will meet with them prior to their New Patient appointment and more fully discuss what services I can provide. At this time patient has no further questions or needs.   Oncology Nurse Navigator Documentation  Oncology Nurse Navigator Flowsheets 02/09/2021  Abnormal Finding Date -  Confirmed Diagnosis Date -  Diagnosis Status -  Navigator Follow Up Date: 03/02/2021  Navigator Follow Up Reason: New Patient Appointment  Navigator Location [facility]  Referral Date to RadOnc/MedOnc -  Navigator Encounter Type Introductory Phone Call  Treatment Initiated Date -  Patient Visit Type MedOnc  Treatment Phase Active Tx  Barriers/Navigation Needs Coordination of Care;Education  Education Other  Interventions Coordination of Care;Education  Acuity Level 2-Minimal Needs (1-2 Barriers Identified)  Coordination of Care Appts  Education Method Verbal  Time Spent with Patient 30

## 2021-02-15 ENCOUNTER — Encounter: Payer: Self-pay | Admitting: Adult Health

## 2021-02-15 ENCOUNTER — Ambulatory Visit: Payer: Commercial Managed Care - PPO | Admitting: Adult Health

## 2021-02-15 VITALS — BP 107/73 | HR 86 | Ht 75.0 in | Wt 179.0 lb

## 2021-02-15 DIAGNOSIS — I10 Essential (primary) hypertension: Secondary | ICD-10-CM | POA: Diagnosis not present

## 2021-02-15 DIAGNOSIS — E785 Hyperlipidemia, unspecified: Secondary | ICD-10-CM | POA: Diagnosis not present

## 2021-02-15 DIAGNOSIS — R7303 Prediabetes: Secondary | ICD-10-CM

## 2021-02-15 DIAGNOSIS — I63232 Cerebral infarction due to unspecified occlusion or stenosis of left carotid arteries: Secondary | ICD-10-CM | POA: Diagnosis not present

## 2021-02-15 DIAGNOSIS — Z95828 Presence of other vascular implants and grafts: Secondary | ICD-10-CM | POA: Diagnosis not present

## 2021-02-15 MED ORDER — ATORVASTATIN CALCIUM 20 MG PO TABS: 20.0000 mg | ORAL_TABLET | Freq: Every day | ORAL | 3 refills | Status: DC

## 2021-02-15 NOTE — Patient Instructions (Signed)
Restart therapies for likely further recovery - can trial going back to work in the beginning of June but with no more than 4 hours/day on light duty/sedentary work  Continue aspirin 81 mg daily  and atorvastatin  for secondary stroke prevention  Will reach out to Dr. Karenann Cai to help schedule follow-up visit regarding your stent Office number: [phone]  Continue to follow up with PCP regarding cholesterol and blood pressure management  Maintain strict control of hypertension with blood pressure goal below 130/90 and cholesterol with LDL cholesterol (bad cholesterol) goal below 70 mg/dL.       Followup in the future with me in 3 months or call earlier if needed       Thank you for coming to see [place] at [facility]. I hope we have been able to provide you high quality care today.  You may receive a patient satisfaction survey over the next few weeks. We would appreciate your feedback and comments so that we may continue to improve ourselves and the health of our patients.   Sensory Loss After a Stroke A stroke can damage parts of your brain that control your body's normal functions, including your senses. As a result, you may have sensory loss, such as trouble seeing, tasting, swallowing, or feeling touch or pressure sensations. You may have problems feeling temperature changes or moving your body in a coordinated way. You may also perceive smell differently. You may have problems with all of your senses or only some of them. By following a treatment plan, you may recover lost senses and manage the changes to your lifestyle. What are treatment therapies for sensory loss? You may have a combination of therapies for sensory loss.  Physical therapy. This may include: ? Exercises to improve your coordination and balance. ? Exercises that combine touch, balance, and movement (sensorimotor training). ? Movements to relieve pressure while you are sitting or  lying (mobility training). ? Splints or braces to protect parts of your body that you cannot feel.  Devices to help with blood flow (circulation) and to help stimulate nerves in affected parts of your body.  Speech therapy to help you swallow safely.  Occupational therapy to help you with everyday tasks. This may include: ? Exercises or devices to improve your vision. ? Exercises to help you increase your touch perception. These may include feeling objects of different sizes and textures while your eyes are closed. ? Techniques for moving safely in your environment.  Prescription eye glasses for vision loss.  Hearing aids for hearing loss. Follow these instructions at home: Safety  Your risk of falling is higher after a stroke. You may have difficulty feeling your legs and feet or coordinating your movements. To lower your risk of falling: ? Use devices to help you move around (assistive devices), such as a wheelchair or walker, as directed by your health care team. ? Wear prescription eye glasses at all times when moving around. ? Use lights to help you see in the dark. ? Use grab bars in bathrooms and handrails in stairways. ? Keep walkways clear in your home by removing rugs, cords, and clutter from the floor.  Your risk of getting burned is higher after a stroke. To lower your risk of burns: ? Test the water temperature before taking a bath or washing your hands. ? Allow hot foods to cool slightly before eating. ? Use potholders when handling hot pans.  When using sharp objects, such as scissors or  knives, use your healthy hand. Do not handle sharp objects with your hand that was affected by your stroke, if this applies.   Activity  Return to your normal activities as told by your health care provider. Ask your health care provider what activities are safe for you.  Avoid spending too much time sitting or lying down. If you must be in a chair or bed, change positions  regularly.  You may have problems doing your normal activities. Ask your health care provider about getting extra help at home. Eating and drinking  You may have problems swallowing food and fluids after a stroke. The problems can be due to: ? Changes in your muscles. ? Sensory changes, such as:  Difficulty feeling the consistency or size of a piece of food in your mouth.  Inability to feel the need to clear your throat.  You may need to: ? Take smaller bites and chew thoroughly. Make sure you have swallowed all the food in your mouth before you take another bite. ? Sit in an upright position when eating or drinking. ? Avoid distractions while eating or drinking. ? Stay upright for 30-45 minutes after eating. ? Change the texture of some things that you eat and drink. This may include:  Changing foods to a smooth, mashed consistency (puree).  Thickening liquids.  Follow instructions from your health care provider about eating and drinking restrictions.   General instructions  Wear arm or leg braces as told by your health care team.  Get help at home as needed. You may need help getting dressed, bathing, using the bathroom, eating, or doing other activities.  Keep all follow-up visits as told by your health care providers. This is important. Summary  It is common to have sensory loss after a stroke. Sensory loss means that you have problems with some or all of your senses, such as vision, taste, hearing, smell, and touch.  Sensory loss happens because of damage to your brain and nervous system after a stroke.  Treatment for sensory loss may include physical, occupational, or speech therapy, and the use of assistive devices.  You may need to make changes to your home and lifestyle after a stroke to help you live safely and independently. This information is not intended to replace advice given to you by your health care provider. Make sure you discuss any questions you have  with your health care provider. Document Revised: 01/24/2019 Document Reviewed: 01/19/2017 Elsevier Patient Education  [employer].

## 2021-02-15 NOTE — Progress Notes (Signed)
I agree with the above plan 

## 2021-02-15 NOTE — Progress Notes (Signed)
[facility] [address] [place]. [place] [address] [phone]       HOSPITAL FOLLOW UP NOTE  Mr. VANDEN HAYTER Date of Birth:  Jul 24, 1956 Medical Record Number:  [id number]   Reason for Referral:  hospital stroke follow up    SUBJECTIVE:   CHIEF COMPLAINT:  Chief Complaint  Patient presents with  . Follow-up    TR with partner (brenda) Pt is well, has R sided weakness/tingling/pain and some drooling. R hand complications     HPI:   Mr.Jsiah B Wallsis a 65 y.o.malewith history of tobacco abuse, alcohol use, and drug use who presented to ED on 12/05/2020  with aphasia and right sided hemiparesis with initial [id number]. Personally reviewed as physician pertinent progress notes, lab work and imaging with summary provided.  Evaluated by Dr. Leonie Man with stroke work-up revealing left ICA and left MCA/M2 stroke s/p IR with TICI 3 reperfusion likely secondary to arthrosclerosis vs cardioembolic. Delay angiogram showed near reocclusion. A carotid stent was deployed across the bifurcation with use of cerebral protection device followed by in stent angioplasty.   2D echo showed EF 30 to 40% with global hypokinesis, LVH and grade 1 diastolic dysfunction.  Placed on aspirin and Brilinta.  Recommended 30-day cardiac event monitor outpatient to further evaluate for arrhythmias.  Elevated BP requiring Cleviprex and initiated amlodipine with long-term BP goal normotensive range.  LDL 109 and recommend initiation on simvastatin.  Pre-DM with A1c 6.8.  Other stroke risk factors include current tobacco use, EtOH use (placed on folic acid, thiamine and MV1), polysubstance abuse (THC and cocaine) and CHF.  Residual deficits of dysphagia, expressive aphasia, mild dysarthria, right lower facial weakness and mild right hemiparesis.  Evaluated by therapies and discharged to CIR for ongoing therapy needs.  Stroke: Left ICA and left MCA/M2 stroke likely secondary to athero versus  cardioembolic   Code Stroke CT head No acute abnormality. ASPECTS 10.   CTA head &neck occlusion of the left ICA  CT perfusion:16 cc completed infarction in the left frontoparietal junction region. Additional 42 cc at risk brain  surrounding that region.  MRI Acute infarct left posterior MCA distribution involving the posterior insula in the left frontal parietal cortex. Small areas of  acute infarct in the frontal lobes bilaterally.  MRA There is moderate stenosis in the middle cerebral artery M1 segment bilaterally. Left internal carotid artery appears widely  patent.  2D Echo: EF: 30-40%, left ventricle  demonstrates global hypokinesis, LVH, Grade 1 diastolic dysfunction  [id number]  HgbA1c6.8  VTE prophylaxis - SCDs only for now  Took occasional aspirin for pain prior to admission, was on Cangrelor x24 hourss/p thrombectomy Now onaspirin 81mg and Brilinta 90mg  BID  Therapy recommendations: CIR  Disposition:  CIR, Insurance approved, PM&R accepted patient and he was transferred to CIR on 3/4.    Today, 02/15/2021, Mr. Mozley is being seen for hospital follow up accompanied by his partner, [name].  He was discharged home from CIR on 12/28/2020 after an 11-day stay.  Initially recovering well with minimal residual deficits but unfortunately he returned back to ED on 01/17/2021 with BRBPR with colonoscopy showing ulcerated cecal mass.  Underwent laparotomy with right hemicolectomy 4/12 with surgical pathology stage II adenocarcinoma with postop course complicated by protracted ileus on TPN.  Recommended decreasing aspirin to 81 mg daily and discontinue Brilinta.  Eventually stabilized and placed on regular diet and was discharged home on 02/07/2021 after 21-day stay.  After prolonged hospitalization, he does report slight increase of  right-sided numbness/tingling as well as weakness.  Residual dysarthria and right facial weakness stable and gradually improving.  Previously working  with therapies at rehab without [place] and plans on restarting OT today and PT/SLP in the near future. 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  He states he recently spoke to his employer who agreed to work with him in regards to limiting hours and returning on light duty such as paperwork once able.  He questions possible return in the next few weeks.  Has remained on aspirin without bruising and denies any additional bleeding Remains on lipitor 20mg  daily without associated side effects Blood pressure today 107/73 - monitors at home which has been stable  Reports complete cessation of tobacco, EtOH, cocaine and THC  Has not yet had follow-up with Dr. Karenann Cai   No further concerns at this time     ROS:   14 system review of systems performed and negative with exception of those listed in HPI  PMH:  Past Medical History:  Diagnosis Date  . Hypertension   . Stroke [facility])     PSH:  Past Surgical History:  Procedure Laterality Date  . APPENDECTOMY    . BIOPSY  01/20/2021   Procedure: BIOPSY;  Surgeon: Arta Silence, MD;  Location: [facility];  Service: Endoscopy;;  . COLONOSCOPY WITH PROPOFOL Left 01/20/2021   Procedure: COLONOSCOPY WITH PROPOFOL;  Surgeon: Arta Silence, MD;  [facility];  Service: Endoscopy;  Laterality: Left;  . ESOPHAGOGASTRODUODENOSCOPY N/A 01/19/2021   Procedure: ESOPHAGOGASTRODUODENOSCOPY (EGD);  Surgeon: Arta Silence, MD;  Location: [facility];  Service: Endoscopy;  Laterality: N/A;  . gastric ulcer surgery    . IR CT HEAD LTD  12/13/2020  . IR INTRAVSC STENT CERV CAROTID W/EMB-PROT MOD SED INCL ANGIO  12/14/2020  . IR PERCUTANEOUS ART THROMBECTOMY/INFUSION INTRACRANIAL INC DIAG ANGIO  12/13/2020  . IR US GUIDE VASC ACCESS RIGHT  12/13/2020  . LAPAROSCOPIC RIGHT HEMI COLECTOMY N/A 01/25/2021   Procedure: LAPAROSCOPIC RIGHT COLECTOMY;   Surgeon: Ralene Ok, MD;  [facility];  Service: General;  Laterality: N/A;  . RADIOLOGY WITH ANESTHESIA N/A 12/13/2020   Procedure: IR WITH ANESTHESIA;  Surgeon: Luanne Bras, MD;  Location: [place];  Service: Radiology;  Laterality: N/A;    Social History:  Social History   Socioeconomic History  . Marital status: Unknown    Spouse name: Not on file  . Number of children: Not on file  . Years of education: Not on file  . Highest education level: Not on file  Occupational History  . Not on file  Tobacco Use  . Smoking status: Former Smoker    Packs/day: 2.00    Years: 48.00    Pack years: 96.00    Quit date: 12/13/2020    Years since quitting: 0.1  . Smokeless tobacco: Never Used  Vaping Use  . Vaping Use: Never used  Substance and Sexual Activity  . Alcohol use: Yes    Comment: weekly  . Drug use: Yes    Types: Marijuana  . Sexual activity: Yes  Other Topics Concern  . Not on file  Social History Narrative  . Not on file   Social Determinants of Health   Financial Resource Strain: Not on file  Food Insecurity: Not on file  Transportation Needs: Not on file  Physical Activity: Not on file  Stress: Not on file  Social Connections: Not on file  Intimate Partner Violence: Not on file    Family History:  Family History  Problem Relation Age of Onset  . Hypertension Mother   . Hypertension Father     Medications:   Current Outpatient Medications on File Prior to Visit  Medication Sig Dispense Refill  . acetaminophen (TYLENOL) 325 MG tablet Take 2 tablets (650 mg total) by mouth every 4 (four) hours as needed for mild pain (or temp > 37.5 C (99.5 F)).    Marland Kitchen aspirin 81 MG chewable tablet Chew 1 tablet (81 mg total) by mouth daily.    . traMADol (ULTRAM) 50 MG tablet Take 1 tablet (50 mg total) by mouth every 6 (six) hours as needed. 20 tablet 0   No current facility-administered medications on file prior to visit.    Allergies:   Allergies   Allergen Reactions  . Ibuprofen Other (See Comments)    Nosebleeds   . Sulfa Antibiotics Rash and Other (See Comments)    "Blisters to skin with cream"  . Sulfamethoxazole Rash and Other (See Comments)    "Blisters to skin with cream"      OBJECTIVE:  Physical Exam  Vitals:   02/15/21 0802  BP: 107/73  Pulse: 86  Weight: 179 lb (81.2 kg)  Height: 6\' 3"  (1.905 m)   Body mass index is 22.37 kg/m. No exam data present  Depression screen [facility] 2/9 02/15/2021  Decreased Interest 0  Down, Depressed, Hopeless 0  PHQ - 2 Score 0     General: well developed, well nourished,  very pleasant middle-aged African-American male, seated, in no evident distress Head: head normocephalic and atraumatic.   Neck: supple with no carotid or supraclavicular bruits Cardiovascular: regular rate and rhythm, no murmurs Musculoskeletal: no deformity Skin:  no rash/petichiae Vascular:  Normal pulses all extremities   Neurologic Exam Mental Status: Awake and fully alert. Mild dysarthria.  Unable to appreciate aphasia.  Oriented to place and time. Recent and remote memory intact. Attention span, concentration and fund of knowledge appropriate. Mood and affect appropriate.  Cranial Nerves: Fundoscopic exam reveals sharp disc margins. Pupils equal, briskly reactive to light. Extraocular movements full without nystagmus. Visual fields full to confrontation. Hearing intact. Facial sensation intact.  Right lower facial weakness.  Tongue and palate moves normally and symmetrically.  Motor: Normal bulk and tone. Normal strength in all tested extremity muscles except decreased right hand dexterity Sensory.: intact to touch , pinprick , position and vibratory sensation.  Right arm and leg abnormal light touch sensation (increased numbness) Coordination: Rapid alternating movements normal in all extremities. Finger-to-nose and heel-to-shin performed accurately on left side but did show right sided ataxia  Gait  and Station: Arises from chair without difficulty. Stance is normal. Gait demonstrates normal stride length with mild imbalance without use of assistive device.  Difficulty performing tandem walk and heel toe.  Romberg negative Reflexes: 1+ and symmetric. Toes downgoing.     NIHSS  3 Modified Rankin  2      ASSESSMENT: Dustin Golden is a 65 y.o. year old male presented with aphasia and right hemiparesis on 12/05/2020 with stroke work-up revealing left ICA and left MCA/M2 stroke s/p IR with TICI 3 reperfusion and reocclusion requiring left ICA stent and angioplasty likely secondary to arthrosclerosis versus cardioembolic. Vascular risk factors include HTN, HLD, tobacco use, EtOH use, THC and cocaine use.  Recent prolonged hospitalization 01/18/2020 for 21 days after presenting with GI bleed and diagnosed with  stage II adenocarcinoma of colon s/p laparotomy with right hemicolectomy.     PLAN:  1. L ICA and L MCA/M2 stroke :  a. Residual deficit: Right-sided sensory impairment and ataxia, dysarthria and gait impairment.  Plans to restart therapies today which were on hold during recent prolonged admission.  Recommend continuing therapies and may return back to work in the beginning of June with restricted hours and light duty/sedentary work as tolerated - work note provided  b. Continue aspirin 81 mg daily  and atorvastatin for secondary stroke prevention.  c. May consider 30-day cardiac event monitor in the near future but would likely not be an AC candidate at this present time in setting of recent GI bleed d. Discussed secondary stroke prevention measures and importance of close PCP follow up for aggressive stroke risk factor management  2. L ICA stent: Continue on aspirin and statin.  6-week course of Brilinta in setting of recent GI bleed. Needs f/u with Dr. Karenann Cai - will reach out to IR schedulers for further assistance 3. HTN: BP goal <130/90.  Stable on lower side on  nonpharmacological management monitor by PCP 4. HLD: LDL goal <70. Recent LDL 109 - started atorvastatin -request follow-up with PCP in the near future for repeat lipid panel 5. Pre-DMII: A1c goal<7.0. Recent A1c 6.8.  Currently being monitored on nonpharmacological management 6. Polysubstance abuse: Congratulated on complete cessation of tobacco, EtOH, THC and cocaine use and highly encouraged continued abstinence 7. Stage II adenocarcinoma of colon: Initial follow-up scheduled with oncology 5/18    Follow up in 3 months or call earlier if needed   CC:  [facility] provider: Dr. Leonie Man PCP: Sabra Heck, [place], [place]    I spent 50 minutes of face-to-face and non-face-to-face time with patient and partner, [name].  This included review of extensive hospitalization and discussion/education regarding recent stroke including etiology, extensive stroke education, residual deficits and typical recovery time, moderate discussion regarding recent hospitalization and answered all other questions to patient and partner satisfaction   Frann Rider, AGNP-BC  [facility] [address] [place], [place] [address]  Phone [phone] Fax [phone] Note: This document was prepared with digital dictation and possible smart phrase technology. Any transcriptional errors that result from this process are unintentional.

## 2021-02-21 ENCOUNTER — Other Ambulatory Visit (HOSPITAL_COMMUNITY): Payer: Self-pay | Admitting: Neuroradiology

## 2021-02-21 ENCOUNTER — Encounter: Payer: Commercial Managed Care - PPO | Admitting: Physical Medicine and Rehabilitation

## 2021-02-21 DIAGNOSIS — I639 Cerebral infarction, unspecified: Secondary | ICD-10-CM

## 2021-02-22 ENCOUNTER — Ambulatory Visit (HOSPITAL_COMMUNITY): Admission: RE | Admit: 2021-02-22 | Payer: Commercial Managed Care - PPO | Source: Ambulatory Visit

## 2021-02-23 ENCOUNTER — Other Ambulatory Visit: Payer: Self-pay

## 2021-02-23 ENCOUNTER — Ambulatory Visit (HOSPITAL_COMMUNITY)
Admission: RE | Admit: 2021-02-23 | Discharge: 2021-02-23 | Disposition: A | Discharge status: Home / Self Care | Payer: Commercial Managed Care - PPO | Source: Ambulatory Visit | Attending: Neuroradiology | Admitting: Neuroradiology

## 2021-02-23 DIAGNOSIS — I639 Cerebral infarction, unspecified: Secondary | ICD-10-CM | POA: Diagnosis not present

## 2021-03-01 ENCOUNTER — Telehealth (HOSPITAL_COMMUNITY): Payer: Self-pay

## 2021-03-01 NOTE — Telephone Encounter (Signed)
Called to schedule consult to discuss recent ultrasound, no answer, left vm. AW

## 2021-03-02 ENCOUNTER — Telehealth: Payer: Self-pay

## 2021-03-02 ENCOUNTER — Encounter: Payer: Self-pay | Admitting: *Deleted

## 2021-03-02 ENCOUNTER — Other Ambulatory Visit: Payer: Self-pay

## 2021-03-02 ENCOUNTER — Encounter: Payer: Self-pay | Admitting: Hematology & Oncology

## 2021-03-02 ENCOUNTER — Inpatient Hospital Stay: Payer: Commercial Managed Care - PPO | Attending: Hematology & Oncology

## 2021-03-02 ENCOUNTER — Inpatient Hospital Stay (HOSPITAL_BASED_OUTPATIENT_CLINIC_OR_DEPARTMENT_OTHER): Payer: Commercial Managed Care - PPO | Admitting: Hematology & Oncology

## 2021-03-02 VITALS — BP 141/84 | HR 80 | Temp 98.3°F | Resp 16 | Ht 75.0 in | Wt 184.0 lb

## 2021-03-02 DIAGNOSIS — Z79899 Other long term (current) drug therapy: Secondary | ICD-10-CM | POA: Diagnosis not present

## 2021-03-02 DIAGNOSIS — Z882 Allergy status to sulfonamides status: Secondary | ICD-10-CM | POA: Insufficient documentation

## 2021-03-02 DIAGNOSIS — Z8249 Family history of ischemic heart disease and other diseases of the circulatory system: Secondary | ICD-10-CM | POA: Insufficient documentation

## 2021-03-02 DIAGNOSIS — C180 Malignant neoplasm of cecum: Secondary | ICD-10-CM | POA: Insufficient documentation

## 2021-03-02 DIAGNOSIS — Z9049 Acquired absence of other specified parts of digestive tract: Secondary | ICD-10-CM

## 2021-03-02 DIAGNOSIS — D573 Sickle-cell trait: Secondary | ICD-10-CM | POA: Insufficient documentation

## 2021-03-02 DIAGNOSIS — Z87891 Personal history of nicotine dependence: Secondary | ICD-10-CM

## 2021-03-02 DIAGNOSIS — Z7189 Other specified counseling: Secondary | ICD-10-CM

## 2021-03-02 DIAGNOSIS — R531 Weakness: Secondary | ICD-10-CM

## 2021-03-02 DIAGNOSIS — C189 Malignant neoplasm of colon, unspecified: Secondary | ICD-10-CM

## 2021-03-02 DIAGNOSIS — Z7982 Long term (current) use of aspirin: Secondary | ICD-10-CM | POA: Diagnosis not present

## 2021-03-02 DIAGNOSIS — Z886 Allergy status to analgesic agent status: Secondary | ICD-10-CM | POA: Insufficient documentation

## 2021-03-02 DIAGNOSIS — K625 Hemorrhage of anus and rectum: Secondary | ICD-10-CM | POA: Insufficient documentation

## 2021-03-02 HISTORY — DX: Other specified counseling: Z71.89

## 2021-03-02 LAB — CBC WITH DIFFERENTIAL (CANCER CENTER ONLY)
Abs Immature Granulocytes: 0.05 10*3/uL (ref 0.00–0.07)
Basophils Absolute: 0.1 10*3/uL (ref 0.0–0.1)
Basophils Relative: 1 %
Eosinophils Absolute: 0.4 10*3/uL (ref 0.0–0.5)
Eosinophils Relative: 6 %
HCT: 35.5 % — ABNORMAL LOW (ref 39.0–52.0)
Hemoglobin: 11.6 g/dL — ABNORMAL LOW (ref 13.0–17.0)
Immature Granulocytes: 1 %
Lymphocytes Relative: 43 %
Lymphs Abs: 2.9 10*3/uL (ref 0.7–4.0)
MCH: 25.1 pg — ABNORMAL LOW (ref 26.0–34.0)
MCHC: 32.7 g/dL (ref 30.0–36.0)
MCV: 76.8 fL — ABNORMAL LOW (ref 80.0–100.0)
Monocytes Absolute: 0.4 10*3/uL (ref 0.1–1.0)
Monocytes Relative: 6 %
Neutro Abs: 3 10*3/uL (ref 1.7–7.7)
Neutrophils Relative %: 43 %
Platelet Count: 249 10*3/uL (ref 150–400)
RBC: 4.62 MIL/uL (ref 4.22–5.81)
RDW: 14.7 % (ref 11.5–15.5)
WBC Count: 6.8 10*3/uL (ref 4.0–10.5)
nRBC: 0 % (ref 0.0–0.2)

## 2021-03-02 LAB — RETICULOCYTES
Immature Retic Fract: 14.5 % (ref 2.3–15.9)
RBC.: 4.64 MIL/uL (ref 4.22–5.81)
Retic Count, Absolute: 75.2 10*3/uL (ref 19.0–186.0)
Retic Ct Pct: 1.6 % (ref 0.4–3.1)

## 2021-03-02 LAB — CMP (CANCER CENTER ONLY)
ALT: 33 U/L (ref 0–44)
AST: 23 U/L (ref 15–41)
Albumin: 4.7 g/dL (ref 3.5–5.0)
Alkaline Phosphatase: 141 U/L — ABNORMAL HIGH (ref 38–126)
Anion gap: 8 (ref 5–15)
BUN: 15 mg/dL (ref 8–23)
CO2: 28 mmol/L (ref 22–32)
Calcium: 10.1 mg/dL (ref 8.9–10.3)
Chloride: 100 mmol/L (ref 98–111)
Creatinine: 1 mg/dL (ref 0.61–1.24)
GFR, Estimated: >60 mL/min (ref >60)
Glucose, Bld: 108 mg/dL — ABNORMAL HIGH (ref 70–99)
Potassium: 4.2 mmol/L (ref 3.5–5.1)
Sodium: 136 mmol/L (ref 135–145)
Total Bilirubin: 0.3 mg/dL (ref 0.3–1.2)
Total Protein: 7.8 g/dL (ref 6.5–8.1)

## 2021-03-02 LAB — LACTATE DEHYDROGENASE: LDH: 118 U/L (ref 98–192)

## 2021-03-02 NOTE — Progress Notes (Signed)
Navigator out of the office for patient's new patient appointment.   There is no further treatment indicated at this time. Will follow until first follow up appointment.  Oncology Nurse Navigator Documentation  Oncology Nurse Navigator Flowsheets 03/02/2021  Abnormal Finding Date -  Confirmed Diagnosis Date -  Diagnosis Status -  Phase of Treatment Surgery  Surgery Actual Start Date: 01/25/2021  Navigator Follow Up Date: 06/30/2021  Navigator Follow Up Reason: Follow-up Appointment  Navigator Location [facility]  Referral Date to RadOnc/MedOnc -  Navigator Encounter Type Appt/Treatment Plan Review  Treatment Initiated Date -  Patient Visit Type MedOnc  Treatment Phase Post-Tx Follow-up  Barriers/Navigation Needs No Barriers At This Time  Education -  Interventions None Required  Acuity Level 1-No Barriers  Coordination of Care -  Education Method -  Support Groups/Services Friends and Family  Time Spent with Patient 15

## 2021-03-02 NOTE — Telephone Encounter (Signed)
appts made per 03/02/21 los and printed for pt   Dustin Golden

## 2021-03-02 NOTE — Progress Notes (Signed)
Referral MD  Reason for Referral: Stage II (T3N0M) adenocarcinoma of the cecum  Chief Complaint  Patient presents with  . New Patient (Initial Visit)  : I had a cancer removed from my intestines  HPI: Mr. Brancato is a very nice 65 year old African-American male.  He comes them with his partner.  His history is quite interesting.  Back in February, he presented with a acute CVA.  He had some left-sided weakness.  I think he did receive some tPA.  He was in the hospital for about 2 weeks or so.  He then has some rehabilitation.  He looks like he is doing quite well which is wonderful.  He does get some physical therapy for this.  In April, he began to have some rectal bleeding.  He is on aspirin and Brilinta.  He ultimately had a CT scan done.  This was done on 01/17/2021.  There was a ileus.  He may have had an obstruction.  He was found to have a 4.5 x 2.9 x 3.2 cm mass in the cecum.  He was admitted.  He did have a colonoscopy.  The colonoscopy was done on 01/20/2021.  There was a large mass noted in the cecum.  It was ulcerated and infiltrative.  Biopsies were taken and the pathology report ([id number]) showed an tubular adenoma with high-grade dysplasia with focally suspicious for invasion.  He was then taken to surgery.  He underwent surgery on 01/25/2021.  Dr. [name] was the surgeon.  As expected, he did a fantastic job.  The pathology report ([id number]) showed invasive colorectal adenocarcinoma.  This was in the cecum.  A measure 4.4 cm.  All margins were negative.  There was no lymphovascular invasion.  There was no perineural invasion.  Most importantly, 28 lymph nodes were all negative.  The tumor was tested for MMR and was normal.  It was also was MSI stable.   As such, he has a stage II (T3N0M0) adenocarcinoma of the cecum.  He is looking good.  He is eating better.  He is gaining a little weight.  He did lose about 20 pounds.  He has had no bleeding.  He has had no pain.  He has  had no cough or shortness of breath.  He was a big smoker.  He probably has a 80-pack-year history of tobacco use.  He stopped in February 2022.  He did have a significant alcohol history.  He is not drinking.  There is no history of cancer in the family.  I forgot to mention he does have sickle cell trait.  He is on nothing for this.  I will put him on folic acid.  Currently, his performance status is ECOG 1.     Past Medical History:  Diagnosis Date  . Hypertension   . Stroke [facility])   :  Past Surgical History:  Procedure Laterality Date  . APPENDECTOMY    . BIOPSY  01/20/2021   Procedure: BIOPSY;  Surgeon: Arta Silence, MD;  Location: [facility];  Service: Endoscopy;;  . COLONOSCOPY WITH PROPOFOL Left 01/20/2021   Procedure: COLONOSCOPY WITH PROPOFOL;  Surgeon: Arta Silence, MD;  [facility]: [facility];  Service: Endoscopy;  Laterality: Left;  . ESOPHAGOGASTRODUODENOSCOPY N/A 01/19/2021   Procedure: ESOPHAGOGASTRODUODENOSCOPY (EGD);  Surgeon: Arta Silence, MD;  Location: [facility];  Service: Endoscopy;  Laterality: N/A;  . gastric ulcer surgery    . IR CT HEAD LTD  12/13/2020  . IR INTRAVSC STENT CERV CAROTID W/EMB-PROT MOD SED  INCL ANGIO  12/14/2020  . IR PERCUTANEOUS ART THROMBECTOMY/INFUSION INTRACRANIAL INC DIAG ANGIO  12/13/2020  . IR US GUIDE VASC ACCESS RIGHT  12/13/2020  . LAPAROSCOPIC RIGHT HEMI COLECTOMY N/A 01/25/2021   Procedure: LAPAROSCOPIC RIGHT COLECTOMY;  Surgeon: Ralene Ok, MD;  [facility];  Service: General;  Laterality: N/A;  . RADIOLOGY WITH ANESTHESIA N/A 12/13/2020   Procedure: IR WITH ANESTHESIA;  Surgeon: Luanne Bras, MD;  Location: [place];  Service: Radiology;  Laterality: N/A;  :   Current Outpatient Medications:  .  acetaminophen (TYLENOL) 325 MG tablet, Take 2 tablets (650 mg total) by mouth every 4 (four) hours as needed for mild pain (or temp > 37.5 C (99.5 F))., Disp: , Rfl:  .  aspirin 81 MG chewable tablet, Chew 1 tablet  (81 mg total) by mouth daily., Disp: , Rfl:  .  atorvastatin (LIPITOR) 20 MG tablet, Take 1 tablet (20 mg total) by mouth daily., Disp: 90 tablet, Rfl: 3 .  traMADol (ULTRAM) 50 MG tablet, Take 1 tablet (50 mg total) by mouth every 6 (six) hours as needed., Disp: 20 tablet, Rfl: 0:  :  Allergies  Allergen Reactions  . Ibuprofen Other (See Comments)    Nosebleeds   . Sulfa Antibiotics Rash and Other (See Comments)    "Blisters to skin with cream"  . Sulfamethoxazole Rash and Other (See Comments)    "Blisters to skin with cream"  :  Family History  Problem Relation Age of Onset  . Hypertension Mother   . Hypertension Father   :  Social History   Socioeconomic History  . Marital status: Unknown    Spouse name: Not on file  . Number of children: Not on file  . Years of education: Not on file  . Highest education level: Not on file  Occupational History  . Not on file  Tobacco Use  . Smoking status: Former Smoker    Packs/day: 2.00    Years: 48.00    Pack years: 96.00    Quit date: 12/13/2020    Years since quitting: 0.2  . Smokeless tobacco: Never Used  Vaping Use  . Vaping Use: Never used  Substance and Sexual Activity  . Alcohol use: Yes    Comment: weekly  . Drug use: Yes    Types: Marijuana  . Sexual activity: Yes  Other Topics Concern  . Not on file  Social History Narrative  . Not on file   Social Determinants of Health   Financial Resource Strain: Not on file  Food Insecurity: Not on file  Transportation Needs: Not on file  Physical Activity: Not on file  Stress: Not on file  Social Connections: Not on file  Intimate Partner Violence: Not on file  :  Review of Systems  Constitutional: Negative.   HENT: Negative.   Eyes: Negative.   Respiratory: Negative.   Cardiovascular: Negative.   Gastrointestinal: Negative.   Genitourinary: Negative.   Musculoskeletal: Negative.   Skin: Negative.   Neurological: Negative.   Endo/Heme/Allergies:  Negative.   Psychiatric/Behavioral: Negative.    Exam:  This is a fairly well-developed and well-nourished African-American male in no obvious distress.  Vital signs show temperature of 98.3.  Pulse 88.  Blood pressure 141/84.  Weight is 184 pounds.  Head and neck exam shows no ocular or oral lesions.  There is no scleral icterus.  There is no adenopathy in the neck.  Lungs are clear to percussion and auscultation bilaterally.  Cardiac exam regular  rate and rhythm with no murmurs, rubs or bruits.  Abdomen is soft.  He has healing laparotomy scar.  There is a second laparotomy scar in the right lower quadrant from past appendectomy surgery.  He has no fluid wave.  There is no guarding or rebound tenderness.  He has good bowel sounds.  There is no palpable liver or spleen tip.  Back exam shows no tenderness over the spine, ribs or hips.  Extremities shows no clubbing, cyanosis or edema.  Neurological exam shows no focal neurological deficits.  Skin exam shows no rashes, ecchymoses or petechia.  '@IPVITALS' @   Recent Labs    03/02/21 1048  WBC 6.8  HGB 11.6*  HCT 35.5*  PLT 249   Recent Labs    03/02/21 1048  NA 136  K 4.2  CL 100  CO2 28  GLUCOSE 108*  BUN 15  CREATININE 1.00  CALCIUM 10.1    Blood smear review: None  Pathology: See above    Assessment and Plan: Mr. Ignasiak is a really nice 64 year old African-American male.  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  I do not see anything on the pathology report that looks suspicious.  The tumor is moderately differentiated.  He had 28 lymph nodes that were negative which I think is a best prognostic indicator.  I do not see any role for adjuvant chemotherapy in this situation.  I think that with surgery alone, the risk of recurrence is probably going to be less than 15%.  We do have to follow him along.  We will have to do surveillance scans.  I probably would do 1 in September.  He does have sickle  cell trait.  I will put him on folic acid at 1 mg a day.  I really think that he should do quite well from the aspect of colon cancer.  I find it interesting that he had the stroke about 2 months before he had the cancer diagnosis.  Thankfully, I think having the stroke was a benefit because if he had not been on antiplatelet agents, he may not have had rectal bleeding.  He had never had a colonoscopy.  I spent a good hour with he and his partner.  They are both very very nice.  It was fun talking to them.  I will see him back in September and he has his scans seem to have centimeters.

## 2021-03-03 LAB — IRON AND TIBC
Iron: 77 ug/dL (ref 42–163)
Saturation Ratios: 20 % (ref 20–55)
TIBC: 391 ug/dL (ref 202–409)
UIBC: 314 ug/dL (ref 117–376)

## 2021-03-03 LAB — CEA (IN HOUSE-CHCC): CEA (CHCC-In House): 1.9 ng/mL (ref 0.00–5.00)

## 2021-03-03 LAB — FERRITIN: Ferritin: 90 ng/mL (ref 24–336)

## 2021-03-09 ENCOUNTER — Other Ambulatory Visit: Payer: Self-pay

## 2021-03-09 ENCOUNTER — Ambulatory Visit (HOSPITAL_COMMUNITY)
Admission: RE | Admit: 2021-03-09 | Discharge: 2021-03-09 | Disposition: A | Discharge status: Home / Self Care | Payer: Commercial Managed Care - PPO | Source: Ambulatory Visit | Attending: Student | Admitting: Student

## 2021-03-09 DIAGNOSIS — I639 Cerebral infarction, unspecified: Secondary | ICD-10-CM

## 2021-03-09 NOTE — Consult Note (Signed)
Chief Complaint: Patient was seen in consultation today for right internal carotid artery stenosis.  Supervising Physician: [name]  Patient Status: [facility] - Out-pt  History of Present Illness: Dustin Golden is a 65 y.o. male with a past medical history significant for tobacco, alcohol, cocaine and THC use was admitted on 12/05/2020 with aphasia and right-sided weakness (NIHSS 22).  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  He was treated with mechanical thrombectomy and left carotid stenting.  Was started on dual antiplatelet therapy with aspirin and Brilinta for his left carotid stent.  On 01/17/2021, he presented to ER with bright blood per rectum when he was diagnosed with colon cancer.  He underwent surgical resection of cecal mass with negative lymph nodes (stage II).  He made a remarkable recovery from the stroke standpoint with persistent mild right-sided hemiparesis, impaired right hand dexterity and mild dysarthria.  He comes today to our service for a follow-up evaluation of his left carotid stent and to discuss management of his right carotid stenosis.   Past Medical History:  Diagnosis Date  . Goals of care, counseling/discussion 03/02/2021  . Hypertension   . Stroke [facility])     Past Surgical History:  Procedure Laterality Date  . APPENDECTOMY    . BIOPSY  01/20/2021   Procedure: BIOPSY;  Surgeon: Arta Silence, MD;  Location: [facility];  Service: Endoscopy;;  . COLONOSCOPY WITH PROPOFOL Left 01/20/2021   Procedure: COLONOSCOPY WITH PROPOFOL;  Surgeon: Arta Silence, MD;  Location: [facility];  Service: Endoscopy;  Laterality: Left;  . ESOPHAGOGASTRODUODENOSCOPY N/A 01/19/2021   Procedure: ESOPHAGOGASTRODUODENOSCOPY (EGD);  Surgeon: Arta Silence, MD;  Location: [facility];  Service: Endoscopy;  Laterality: N/A;  . gastric ulcer surgery    . IR CT HEAD LTD  12/13/2020  . IR INTRAVSC  STENT CERV CAROTID W/EMB-PROT MOD SED INCL ANGIO  12/14/2020  . IR PERCUTANEOUS ART THROMBECTOMY/INFUSION INTRACRANIAL INC DIAG ANGIO  12/13/2020  . IR US GUIDE VASC ACCESS RIGHT  12/13/2020  . LAPAROSCOPIC RIGHT HEMI COLECTOMY N/A 01/25/2021   Procedure: LAPAROSCOPIC RIGHT COLECTOMY;  Surgeon: Ralene Ok, MD;  Location: [facility];  Service: General;  Laterality: N/A;  . RADIOLOGY WITH ANESTHESIA N/A 12/13/2020   Procedure: IR WITH ANESTHESIA;  Surgeon: [name], MD;  Location: [place];  Service: Radiology;  Laterality: N/A;    Allergies: Ibuprofen, Sulfa antibiotics, and Sulfamethoxazole  Medications: Prior to Admission medications   Medication Sig Start Date End Date Taking? Authorizing Provider  acetaminophen (TYLENOL) 325 MG tablet Take 2 tablets (650 mg total) by mouth every 4 (four) hours as needed for mild pain (or temp > 37.5 C (99.5 F)). 12/17/20   Bailey-Modzik, Mayo Ao A, NP  aspirin 81 MG chewable tablet Chew 1 tablet (81 mg total) by mouth daily. 12/18/20   Bailey-Modzik, Delila A, NP  atorvastatin (LIPITOR) 20 MG tablet Take 1 tablet (20 mg total) by mouth daily. 02/15/21   Frann Rider, NP  traMADol (ULTRAM) 50 MG tablet Take 1 tablet (50 mg total) by mouth every 6 (six) hours as needed. 02/07/21 02/07/22  Georgette Shell, MD     Family History  Problem Relation Age of Onset  . Hypertension Mother   . Hypertension Father     Social History   Socioeconomic History  . Marital status: Unknown    Spouse name: Not on file  . Number of children: Not on  file  . Years of education: Not on file  . Highest education level: Not on file  Occupational History  . Not on file  Tobacco Use  . Smoking status: Former Smoker    Packs/day: 2.00    Years: 48.00    Pack years: 96.00    Quit date: 12/13/2020    Years since quitting: 0.2  . Smokeless tobacco: Never Used  Vaping Use  . Vaping Use: Never used  Substance and Sexual Activity  . Alcohol use: Yes     Comment: weekly  . Drug use: Yes    Types: Marijuana  . Sexual activity: Yes  Other Topics Concern  . Not on file  Social History Narrative  . Not on file   Social Determinants of Health   Financial Resource Strain: Not on file  Food Insecurity: Not on file  Transportation Needs: Not on file  Physical Activity: Not on file  Stress: Not on file  Social Connections: Not on file     Review of Systems: A 12 point ROS discussed and pertinent positives are indicated in the HPI above.  All other systems are negative.  Review of Systems  Vital Signs: There were no vitals taken for this visit.  Physical Exam Constitutional:      General: He is not in acute distress.    Appearance: Normal appearance.  Eyes:     Extraocular Movements: Extraocular movements intact.     Conjunctiva/sclera: Conjunctivae normal.     Pupils: Pupils are equal, round, and reactive to light.  Neurological:     Mental Status: He is alert and oriented to person, place, and time. Mental status is at baseline.     Cranial Nerves: Cranial nerves are intact.     Sensory: Sensory deficit present.     Motor: No weakness or pronator drift.     Comments: Mild right sided hemiparesis.          Imaging: VAS US CAROTID  Result Date: 02/24/2021 Carotid Arterial Duplex Study Patient Name:  Dustin Golden  Date of Exam:   02/23/2021 Medical Rec #: [id number]     Accession #:    [id number] Date of Birth: 21-Jan-1956     Patient Gender: M Patient Age:   64Y Exam Location:  [facility] Procedure:      VAS US CAROTID Referring Phys: [id number] [place] --------------------------------------------------------------------------------  Indications:       CVA and Left stent. Risk Factors:      Hypertension. Other Factors:     History of know right ICA 80% or greater stenosis on CT neck                    angio 11/17/20. Comparison Study:  No prior carotid scans. Performing Technologist: Oda Cogan  RDMS, RVT  Examination Guidelines: A complete evaluation includes B-mode imaging, spectral Doppler, color Doppler, and power Doppler as needed of all accessible portions of each vessel. Bilateral testing is considered an integral part of a complete examination. Limited examinations for reoccurring indications may be performed as noted.  Right Carotid Findings: +----------+--------+--------+--------+------------------+--------+           PSV cm/sEDV cm/sStenosisPlaque DescriptionComments +----------+--------+--------+--------+------------------+--------+ CCA Prox  67      10                                         +----------+--------+--------+--------+------------------+--------+  CCA Distal61      15                                         +----------+--------+--------+--------+------------------+--------+ ICA Prox  548     223     80-99%  heterogenous               +----------+--------+--------+--------+------------------+--------+ ICA Mid   393     151     80-99%                             +----------+--------+--------+--------+------------------+--------+ ICA Distal110     30                                         +----------+--------+--------+--------+------------------+--------+ ECA       49      11                                         +----------+--------+--------+--------+------------------+--------+ +----------+--------+-------+----------------+-------------------+           PSV cm/sEDV cmsDescribe        Arm Pressure (mmHG) +----------+--------+-------+----------------+-------------------+ [id number]             Multiphasic, WNL                    +----------+--------+-------+----------------+-------------------+ +---------+--------+--+--------+--+---------+ VertebralPSV cm/s53EDV cm/s18Antegrade +---------+--------+--+--------+--+---------+  Left Carotid Findings: +----------+--------+--------+--------+------------------+--------+            PSV cm/sEDV cm/sStenosisPlaque DescriptionComments +----------+--------+--------+--------+------------------+--------+ CCA Prox  100     26                                         +----------+--------+--------+--------+------------------+--------+ CCA Distal76      20                                         +----------+--------+--------+--------+------------------+--------+ ICA Distal101     36                                         +----------+--------+--------+--------+------------------+--------+ ECA       50      9                                          +----------+--------+--------+--------+------------------+--------+ +----------+--------+--------+--------+-------------------+           PSV cm/sEDV cm/sDescribeArm Pressure (mmHG) +----------+--------+--------+--------+-------------------+ [id number]                                         +----------+--------+--------+--------+-------------------+ +---------+--------+--+--------+--+ VertebralPSV cm/s48EDV cm/s16 +---------+--------+--+--------+--+  Left Stent(s): +---------------+---+--+-------------+++ Prox to Stent  59 23              +---------------+---+--+-------------+++  Proximal Stent 48 19              +---------------+---+--+-------------+++ Mid Stent      14460<50% stenosis +---------------+---+--+-------------+++ Distal Stent   16159<50% stenosis +---------------+---+--+-------------+++ Distal to [id number]              +---------------+---+--+-------------+++    Summary: Right Carotid: Velocities in the right ICA are consistent with a known 80-99%                stenosis. Left Carotid: Patent left ICA stent with equal to or less 50% stenosis. Vertebrals:  Bilateral vertebral arteries demonstrate antegrade flow. Subclavians: Normal flow hemodynamics were seen in bilateral subclavian              arteries. *See table(s) above for measurements and  observations.  Electronically signed by Antony Contras MD on 02/24/2021 at 12:02:39 PM.    Final     Labs:  CBC: Recent Labs    02/02/21 0333 02/04/21 0400 02/07/21 0622 03/02/21 1048  WBC 10.6* 12.1* 10.9* 6.8  HGB 9.3* 8.8* 9.4* 11.6*  HCT 27.9* 27.6* 28.3* 35.5*  PLT 386 396 435* 249    COAGS: Recent Labs    12/13/20 0151 01/17/21 1444  INR 0.9 0.9  APTT 28  --     BMP: Recent Labs    02/05/21 0310 02/06/21 0416 02/07/21 0622 03/02/21 1048  NA 137 138 134* 136  K 4.1 4.3 4.3 4.2  CL 107 106 103 100  CO2 22 22 23 28   GLUCOSE 119* 120* 110* 108*  BUN 16 14 16 15   CALCIUM 8.8* 9.1 8.9 10.1  CREATININE 0.68 0.88 0.90 1.00  GFRNONAA >60 >60 >60 >60    LIVER FUNCTION TESTS: Recent Labs    02/02/21 0333 02/03/21 0321 02/07/21 0622 03/02/21 1048  BILITOT 0.4 0.3 0.5 0.3  AST 17 16 92* 23  ALT 39 31 174* 33  ALKPHOS 119 107 273* 141*  PROT 6.7 6.8 7.5 7.8  ALBUMIN 3.2* 3.0* 3.2* 4.7    TUMOR MARKERS: No results for input(s): AFPTM, CEA, CA199, CHROMGRNA in the last 8760 hours.  Assessment and Plan:  Dustin Golden has made a remarkable recovery from a stroke standpoint with persistent mild right-sided hemiparesis, impaired right hand dexterity and mild dysarthria.  His recent carotid ultrasound showed no hemodynamically significant stenosis in the recently placed left carotid bifurcation stent.  There is an 80-90% stenosis of the right carotid at the bifurcation, correlating with findings of prior CT angiogram.  I believe Dustin Golden would benefit from right carotid stenting and angioplasty for which he would need to be restarted on dual anti-platelet therapy.  Risks and benefits were discussed with the patient who understands and would like to proceed with right carotid stenting.  I will reach out to Dr. [name] (surgery) and Dr. [name] (oncology) to confirm no surgical procedure is planned for Dustin Golden and no contraindication for dual anti-platelet therapy.  Our  schedulers will then and reach out to Dustin Golden to book his right carotid stenting under monitored anesthesia care.   Thank you for this interesting consult.  I greatly enjoyed meeting Dustin Golden and look forward to participating in their care.  A copy of this report was sent to the requesting provider on this date.  Electronically Signed: [name], MD 03/09/2021, 1:36 PM   I spent a total of  25 Minutes in face to face in clinical consultation, greater than 50% of which was  counseling/coordinating care for right carotid stenosis.

## 2021-03-17 ENCOUNTER — Other Ambulatory Visit: Payer: Self-pay

## 2021-03-17 NOTE — Patient Outreach (Signed)
[facility]) Care Management  03/17/2021  Dustin Golden 1956-04-19 [id number]   Telephone outreach to patient to obtain mRS was successfully completed. MRS= 1   [place] Care Management Assistant

## 2021-03-23 ENCOUNTER — Other Ambulatory Visit (HOSPITAL_COMMUNITY): Payer: Self-pay | Admitting: Neuroradiology

## 2021-03-23 DIAGNOSIS — I771 Stricture of artery: Secondary | ICD-10-CM

## 2021-03-29 ENCOUNTER — Other Ambulatory Visit (HOSPITAL_COMMUNITY): Payer: Self-pay

## 2021-03-29 ENCOUNTER — Other Ambulatory Visit (HOSPITAL_COMMUNITY)
Admission: RE | Admit: 2021-03-29 | Discharge: 2021-03-29 | Disposition: A | Discharge status: Home / Self Care | Payer: Commercial Managed Care - PPO | Source: Ambulatory Visit | Attending: Neuroradiology | Admitting: Neuroradiology

## 2021-03-29 ENCOUNTER — Other Ambulatory Visit: Payer: Self-pay

## 2021-03-29 ENCOUNTER — Other Ambulatory Visit: Payer: Self-pay | Admitting: Radiology

## 2021-03-29 DIAGNOSIS — Z20822 Contact with and (suspected) exposure to covid-19: Secondary | ICD-10-CM | POA: Insufficient documentation

## 2021-03-29 DIAGNOSIS — Z01812 Encounter for preprocedural laboratory examination: Secondary | ICD-10-CM | POA: Diagnosis present

## 2021-03-29 DIAGNOSIS — I771 Stricture of artery: Secondary | ICD-10-CM

## 2021-03-29 LAB — SARS CORONAVIRUS 2 (TAT 6-24 HRS): SARS Coronavirus 2: NEGATIVE

## 2021-03-30 ENCOUNTER — Other Ambulatory Visit: Payer: Self-pay | Admitting: Radiology

## 2021-03-30 ENCOUNTER — Other Ambulatory Visit: Payer: Self-pay | Admitting: Student

## 2021-03-30 ENCOUNTER — Encounter (HOSPITAL_COMMUNITY): Payer: Self-pay | Admitting: *Deleted

## 2021-03-30 NOTE — Progress Notes (Signed)
DUE TO COVID-19 ONLY ONE VISITOR IS ALLOWED TO COME WITH YOU AND STAY IN THE WAITING ROOM ONLY DURING PRE OP AND PROCEDURE DAY OF SURGERY.   PCP - Fredda Hammed, NP Cardiologist - n/a  CT Chest x-ray - 01/20/21 EKG - 02/02/21 Stress Test - n/a ECHO - 12/13/20 Cardiac Cath - n/a  ICD Pacemaker/Loop - n/a  Sleep Study -  n/a CPAP - none  Blood Thinner Instructions:  Follow your surgeon's instructions on when to stop Brilinta and Aspirin prior to surgery.  Anesthesia review: Yes  STOP now taking any Aspirin (unless otherwise instructed by your surgeon), Aleve, Naproxen, Ibuprofen, Motrin, Advil, Goody's, BC's, all herbal medications, fish oil, and all vitamins.   Coronavirus Screening Covid test on 03/29/21 was negative.  Do you have any of the following symptoms:  Cough yes/no: No Fever (>100.60F)  yes/no: No Runny nose yes/no: No Sore throat yes/no: No Difficulty breathing/shortness of breath  yes/no: No  Have you traveled in the last 14 days and where? yes/no: No  Patient verbalized understanding of instructions that were given via phone.

## 2021-03-31 ENCOUNTER — Encounter (HOSPITAL_COMMUNITY): Payer: Self-pay

## 2021-03-31 ENCOUNTER — Encounter (HOSPITAL_COMMUNITY)
Admission: AD | Disposition: A | Discharge status: Home / Self Care | Payer: Self-pay | Source: Home / Self Care | Attending: Neuroradiology

## 2021-03-31 ENCOUNTER — Ambulatory Visit (HOSPITAL_COMMUNITY)
Admission: RE | Admit: 2021-03-31 | Discharge: 2021-03-31 | Disposition: A | Discharge status: Home / Self Care | Payer: Commercial Managed Care - PPO | Source: Ambulatory Visit | Attending: Neuroradiology | Admitting: Neuroradiology

## 2021-03-31 ENCOUNTER — Ambulatory Visit (HOSPITAL_COMMUNITY): Payer: Commercial Managed Care - PPO | Admitting: Anesthesiology

## 2021-03-31 ENCOUNTER — Other Ambulatory Visit: Payer: Self-pay

## 2021-03-31 ENCOUNTER — Observation Stay (HOSPITAL_COMMUNITY)
Admission: AD | Admit: 2021-03-31 | Discharge: 2021-04-01 | Disposition: A | Discharge status: Home / Self Care | Payer: Commercial Managed Care - PPO | Attending: Neuroradiology | Admitting: Neuroradiology

## 2021-03-31 DIAGNOSIS — Z882 Allergy status to sulfonamides status: Secondary | ICD-10-CM | POA: Insufficient documentation

## 2021-03-31 DIAGNOSIS — I771 Stricture of artery: Secondary | ICD-10-CM

## 2021-03-31 DIAGNOSIS — I6523 Occlusion and stenosis of bilateral carotid arteries: Secondary | ICD-10-CM

## 2021-03-31 DIAGNOSIS — I10 Essential (primary) hypertension: Secondary | ICD-10-CM | POA: Diagnosis not present

## 2021-03-31 DIAGNOSIS — Z8673 Personal history of transient ischemic attack (TIA), and cerebral infarction without residual deficits: Secondary | ICD-10-CM | POA: Diagnosis not present

## 2021-03-31 DIAGNOSIS — Z886 Allergy status to analgesic agent status: Secondary | ICD-10-CM | POA: Diagnosis not present

## 2021-03-31 DIAGNOSIS — Z7902 Long term (current) use of antithrombotics/antiplatelets: Secondary | ICD-10-CM | POA: Insufficient documentation

## 2021-03-31 DIAGNOSIS — I779 Disorder of arteries and arterioles, unspecified: Secondary | ICD-10-CM

## 2021-03-31 DIAGNOSIS — Z7982 Long term (current) use of aspirin: Secondary | ICD-10-CM | POA: Diagnosis not present

## 2021-03-31 DIAGNOSIS — Z87891 Personal history of nicotine dependence: Secondary | ICD-10-CM | POA: Diagnosis not present

## 2021-03-31 DIAGNOSIS — Z79899 Other long term (current) drug therapy: Secondary | ICD-10-CM | POA: Insufficient documentation

## 2021-03-31 DIAGNOSIS — I6521 Occlusion and stenosis of right carotid artery: Secondary | ICD-10-CM | POA: Diagnosis not present

## 2021-03-31 DIAGNOSIS — I6300 Cerebral infarction due to thrombosis of unspecified precerebral artery: Secondary | ICD-10-CM

## 2021-03-31 HISTORY — PX: IR ANGIO VERTEBRAL SEL SUBCLAVIAN INNOMINATE UNI L MOD SED: IMG5364

## 2021-03-31 HISTORY — PX: IR ANGIO INTRA EXTRACRAN SEL COM CAROTID INNOMINATE BILAT MOD SED: IMG5360

## 2021-03-31 HISTORY — PX: IR ANGIO VERTEBRAL SEL VERTEBRAL UNI R MOD SED: IMG5368

## 2021-03-31 HISTORY — DX: Disorder of arteries and arterioles, unspecified: I77.9

## 2021-03-31 HISTORY — PX: IR INTRAVSC STENT CERV CAROTID W/EMB-PROT MOD SED INCL ANGIO: IMG2303

## 2021-03-31 HISTORY — PX: RADIOLOGY WITH ANESTHESIA: SHX6223

## 2021-03-31 HISTORY — PX: IR US GUIDE VASC ACCESS RIGHT: IMG2390

## 2021-03-31 LAB — CBC WITH DIFFERENTIAL/PLATELET
Abs Immature Granulocytes: 0.01 10*3/uL (ref 0.00–0.07)
Basophils Absolute: 0.1 10*3/uL (ref 0.0–0.1)
Basophils Relative: 1 %
Eosinophils Absolute: 0.1 10*3/uL (ref 0.0–0.5)
Eosinophils Relative: 2 %
HCT: 38.4 % — ABNORMAL LOW (ref 39.0–52.0)
Hemoglobin: 12.3 g/dL — ABNORMAL LOW (ref 13.0–17.0)
Immature Granulocytes: 0 %
Lymphocytes Relative: 44 %
Lymphs Abs: 2.5 10*3/uL (ref 0.7–4.0)
MCH: 24.9 pg — ABNORMAL LOW (ref 26.0–34.0)
MCHC: 32 g/dL (ref 30.0–36.0)
MCV: 77.9 fL — ABNORMAL LOW (ref 80.0–100.0)
Monocytes Absolute: 0.3 10*3/uL (ref 0.1–1.0)
Monocytes Relative: 5 %
Neutro Abs: 2.7 10*3/uL (ref 1.7–7.7)
Neutrophils Relative %: 48 %
Platelets: 209 10*3/uL (ref 150–400)
RBC: 4.93 MIL/uL (ref 4.22–5.81)
RDW: 15.7 % — ABNORMAL HIGH (ref 11.5–15.5)
WBC: 5.7 10*3/uL (ref 4.0–10.5)
nRBC: 0 % (ref 0.0–0.2)

## 2021-03-31 LAB — BASIC METABOLIC PANEL
Anion gap: 7 (ref 5–15)
BUN: 10 mg/dL (ref 8–23)
CO2: 26 mmol/L (ref 22–32)
Calcium: 9.4 mg/dL (ref 8.9–10.3)
Chloride: 104 mmol/L (ref 98–111)
Creatinine, Ser: 1.04 mg/dL (ref 0.61–1.24)
GFR, Estimated: >60 mL/min (ref >60)
Glucose, Bld: 115 mg/dL — ABNORMAL HIGH (ref 70–99)
Potassium: 4.2 mmol/L (ref 3.5–5.1)
Sodium: 137 mmol/L (ref 135–145)

## 2021-03-31 LAB — MRSA PCR SCREENING: MRSA by PCR: NEGATIVE

## 2021-03-31 LAB — POCT ACTIVATED CLOTTING TIME
Activated Clotting Time: 132 s
Activated Clotting Time: 196 seconds
Activated Clotting Time: 202 seconds
Activated Clotting Time: 213 seconds

## 2021-03-31 LAB — PROTIME-INR
INR: 0.9 (ref 0.8–1.2)
Prothrombin Time: 12.3 seconds (ref 11.4–15.2)

## 2021-03-31 SURGERY — IR WITH ANESTHESIA
Anesthesia: Monitor Anesthesia Care

## 2021-03-31 MED ORDER — OXYCODONE HCL 5 MG PO TABS
10.0000 mg | ORAL_TABLET | Freq: Once | ORAL | Status: AC
  Administered 2021-03-31: 10 mg via ORAL
  Filled 2021-03-31: qty 2

## 2021-03-31 MED ORDER — NITROGLYCERIN 1 MG/10 ML FOR IR/CATH LAB: 100.0000 ug | Freq: Once | INTRA_ARTERIAL | Status: DC

## 2021-03-31 MED ORDER — ENSURE ENLIVE PO LIQD: 237.0000 mL | Freq: Two times a day (BID) | ORAL | Status: DC

## 2021-03-31 MED ORDER — SODIUM CHLORIDE 0.9 % IV SOLN: INTRAVENOUS | Status: DC

## 2021-03-31 MED ORDER — ACETAMINOPHEN 650 MG RE SUPP: 650.0000 mg | RECTAL | Status: DC | PRN

## 2021-03-31 MED ORDER — NITROGLYCERIN 1 MG/10 ML FOR IR/CATH LAB
INTRA_ARTERIAL | Status: AC | PRN
  Administered 2021-03-31 (×2): 200 ug via INTRA_ARTERIAL

## 2021-03-31 MED ORDER — HEPARIN SODIUM (PORCINE) 1000 UNIT/ML IJ SOLN
INTRAMUSCULAR | Status: DC | PRN
  Administered 2021-03-31: 2000 [IU] via INTRAVENOUS
  Administered 2021-03-31: 1000 [IU] via INTRAVENOUS

## 2021-03-31 MED ORDER — LACTATED RINGERS IV SOLN: INTRAVENOUS | Status: DC

## 2021-03-31 MED ORDER — PROPOFOL 10 MG/ML IV BOLUS
INTRAVENOUS | Status: DC | PRN
  Administered 2021-03-31 (×2): 20 mg via INTRAVENOUS

## 2021-03-31 MED ORDER — PHENYLEPHRINE 40 MCG/ML (10ML) SYRINGE FOR IV PUSH (FOR BLOOD PRESSURE SUPPORT)
PREFILLED_SYRINGE | INTRAVENOUS | Status: DC | PRN
  Administered 2021-03-31: 120 ug via INTRAVENOUS

## 2021-03-31 MED ORDER — HEPARIN SODIUM (PORCINE) 1000 UNIT/ML IJ SOLN
INTRAMUSCULAR | Status: AC
  Filled 2021-03-31: qty 1

## 2021-03-31 MED ORDER — VERAPAMIL HCL 2.5 MG/ML IV SOLN
INTRAVENOUS | Status: AC
  Filled 2021-03-31: qty 2

## 2021-03-31 MED ORDER — LIDOCAINE HCL (PF) 1 % IJ SOLN
INTRAMUSCULAR | Status: AC
  Filled 2021-03-31: qty 30

## 2021-03-31 MED ORDER — HEPARIN SODIUM (PORCINE) 1000 UNIT/ML IJ SOLN
INTRAMUSCULAR | Status: AC | PRN
  Administered 2021-03-31: 5000 [IU] via INTRA_ARTERIAL

## 2021-03-31 MED ORDER — LIDOCAINE HCL 1 % IJ SOLN
INTRAMUSCULAR | Status: AC | PRN
  Administered 2021-03-31: 1 mL

## 2021-03-31 MED ORDER — FENTANYL CITRATE (PF) 100 MCG/2ML IJ SOLN: 25.0000 ug | Freq: Once | INTRAMUSCULAR | Status: DC

## 2021-03-31 MED ORDER — NIMODIPINE 30 MG PO CAPS
0.0000 mg | ORAL_CAPSULE | ORAL | Status: AC
  Administered 2021-03-31: 60 mg via ORAL
  Filled 2021-03-31: qty 2

## 2021-03-31 MED ORDER — OXYCODONE HCL 5 MG PO TABS: 5.0000 mg | ORAL_TABLET | Freq: Once | ORAL | Status: DC | PRN

## 2021-03-31 MED ORDER — IOHEXOL 240 MG/ML SOLN
150.0000 mL | Freq: Once | INTRAMUSCULAR | Status: AC | PRN
  Administered 2021-03-31: 80 mL via INTRAVENOUS

## 2021-03-31 MED ORDER — ONDANSETRON HCL 4 MG/2ML IJ SOLN: 4.0000 mg | Freq: Once | INTRAMUSCULAR | Status: DC | PRN

## 2021-03-31 MED ORDER — VERAPAMIL HCL 2.5 MG/ML IV SOLN
INTRAVENOUS | Status: AC | PRN
  Administered 2021-03-31: 5 mg via INTRAVENOUS

## 2021-03-31 MED ORDER — CHLORHEXIDINE GLUCONATE CLOTH 2 % EX PADS
6.0000 | MEDICATED_PAD | Freq: Every day | CUTANEOUS | Status: DC
  Administered 2021-04-01: 6 via TOPICAL

## 2021-03-31 MED ORDER — CEFAZOLIN SODIUM-DEXTROSE 2-4 GM/100ML-% IV SOLN
2.0000 g | INTRAVENOUS | Status: AC
  Administered 2021-03-31: 2 g via INTRAVENOUS
  Filled 2021-03-31: qty 100

## 2021-03-31 MED ORDER — CHLORHEXIDINE GLUCONATE 0.12 % MT SOLN
OROMUCOSAL | Status: AC
  Administered 2021-03-31: 15 mL
  Filled 2021-03-31: qty 15

## 2021-03-31 MED ORDER — CLEVIDIPINE BUTYRATE 0.5 MG/ML IV EMUL
0.0000 mg/h | INTRAVENOUS | Status: DC
Start: 2021-03-31 — End: 2021-04-01
  Administered 2021-03-31: 21 mg/h via INTRAVENOUS
  Administered 2021-03-31: 16 mg/h via INTRAVENOUS
  Administered 2021-03-31: 14 mg/h via INTRAVENOUS
  Administered 2021-03-31: 16 mg/h via INTRAVENOUS
  Administered 2021-03-31: 5 mg/h via INTRAVENOUS
  Administered 2021-03-31: 21 mg/h via INTRAVENOUS
  Administered 2021-04-01: 18 mg/h via INTRAVENOUS
  Administered 2021-04-01: 16 mg/h via INTRAVENOUS
  Administered 2021-04-01: 18 mg/h via INTRAVENOUS
  Filled 2021-03-31 (×3): qty 100
  Filled 2021-03-31: qty 50
  Filled 2021-03-31: qty 100
  Filled 2021-03-31 (×2): qty 50
  Filled 2021-03-31 (×2): qty 100

## 2021-03-31 MED ORDER — OXYCODONE HCL 5 MG/5ML PO SOLN: 5.0000 mg | Freq: Once | ORAL | Status: DC | PRN

## 2021-03-31 MED ORDER — GLYCOPYRROLATE PF 0.2 MG/ML IJ SOSY
PREFILLED_SYRINGE | INTRAMUSCULAR | Status: DC | PRN
  Administered 2021-03-31: .2 mg via INTRAVENOUS

## 2021-03-31 MED ORDER — PROPOFOL 500 MG/50ML IV EMUL
INTRAVENOUS | Status: DC | PRN
  Administered 2021-03-31: 140 ug/kg/min via INTRAVENOUS
  Administered 2021-03-31: 100 ug/kg/min via INTRAVENOUS

## 2021-03-31 MED ORDER — ONDANSETRON HCL 4 MG/2ML IJ SOLN: 4.0000 mg | Freq: Four times a day (QID) | INTRAMUSCULAR | Status: DC | PRN

## 2021-03-31 MED ORDER — FENTANYL CITRATE (PF) 100 MCG/2ML IJ SOLN: 25.0000 ug | INTRAMUSCULAR | Status: DC | PRN

## 2021-03-31 MED ORDER — PHENYLEPHRINE HCL-NACL 10-0.9 MG/250ML-% IV SOLN
INTRAVENOUS | Status: DC | PRN
  Administered 2021-03-31: 20 ug/min via INTRAVENOUS

## 2021-03-31 MED ORDER — CLOPIDOGREL BISULFATE 75 MG PO TABS
75.0000 mg | ORAL_TABLET | ORAL | Status: AC
  Administered 2021-03-31: 75 mg via ORAL
  Filled 2021-03-31: qty 1

## 2021-03-31 MED ORDER — IOHEXOL 240 MG/ML SOLN
INTRAMUSCULAR | Status: AC
  Filled 2021-03-31: qty 200

## 2021-03-31 MED ORDER — ACETAMINOPHEN 325 MG PO TABS
650.0000 mg | ORAL_TABLET | ORAL | Status: DC | PRN
  Administered 2021-03-31 – 2021-04-01 (×3): 650 mg via ORAL
  Filled 2021-03-31 (×3): qty 2

## 2021-03-31 MED ORDER — ACETAMINOPHEN 160 MG/5ML PO SOLN: 650.0000 mg | ORAL | Status: DC | PRN

## 2021-03-31 NOTE — Sedation Documentation (Signed)
TR band applied at 1116 with 14 cc air instilled. Pulses +2, site level 0

## 2021-03-31 NOTE — Procedures (Signed)
INTERVENTIONAL NEURORADIOLOGY BRIEF POSTPROCEDURE NOTE  DIAGNOSTIC CEREBRAL ANGIOGRAM AND RIGHT CAROTID STENTING  Attending: Dr. [name]  Assistant: None.  Diagnosis: Right carotid stenosis  Access site: Distal right radial artery  Access closure: Inflatable band  Anesthesia: MAC  Medication used: refer to anesthesia documentation.  Complications: None.  Estimated blood loss: 72m  Specimen: None.  Findings: Hairline stenosis of the right ICA at the bulb. Angioplasty and stenting performed with complete resolution of stenosis and significant improvement of the anterograde flow in the right anterior circulation.  The patient tolerated the procedure well without incident or complication and is in stable condition.

## 2021-03-31 NOTE — Anesthesia Procedure Notes (Signed)
Arterial Line Insertion Start/End6/16/2022 7:05 AM, 03/31/2021 7:10 AM Performed by: Thelma Comp, CRNA, CRNA  Patient location: Pre-op. Preanesthetic checklist: patient identified, IV checked, site marked, risks and benefits discussed, surgical consent, monitors and equipment checked, pre-op evaluation, timeout performed and anesthesia consent Lidocaine 1% used for infiltration Left, radial was placed Catheter size: 20 Fr Hand hygiene performed  and maximum sterile barriers used   Attempts: 1 Procedure performed without using ultrasound guided technique. Following insertion, dressing applied and Biopatch. Post procedure assessment: normal and unchanged  Patient tolerated the procedure well with no immediate complications.

## 2021-03-31 NOTE — H&P (Deleted)
  The note originally documented on this encounter has been moved the the encounter in which it belongs.  

## 2021-03-31 NOTE — Progress Notes (Signed)
PA notified of patient complaining of pain "all over."  No specific complaints, only generalized discomfort.  Procedure sites intact.  Neuro intact/unchanged from prior.  Ordered 1 time dose of pain medication.  RN aware.  Brynda Greathouse, MS RD PA-C

## 2021-03-31 NOTE — Procedures (Deleted)
  The note originally documented on this encounter has been moved the the encounter in which it belongs.  

## 2021-03-31 NOTE — H&P (Signed)
Chief Complaint: Patient was seen in consultation today for right carotid artery stenois  Referring Physician(s): [place]  Supervising Physician: [name]  Patient Status: [facility] - Out-pt  History of Present Illness: Dustin Golden is a 65 y.o. 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  He was treated with mechanical thrombectomy and left carotid stenting.  He was started on dual antiplatelet therapy with aspirin and Brilinta for his left carotid stent but subsequently presented to ED with BRBPR leading to Stage II colon cancer diagnosis.  After successful resection, he was able to resume DAPT.  He is now stable to proceed with right carotid stent placement to treat his right carotid stenosis.  Patient most recently met with Dr. Karenann Cai in consultation 03/09/21 to discuss.  After consultation he elected to proceed and presents to [facility] Radiology today for procedure.     On assessment, he does have persistent mild right-sided hemiparesis, impaired right hand dexterity and mild dysarthria.  He is still undergoing PT for this.  He is aware of hte goals of the procedure today and is agreeable to proceed.  He has been NPO.  He did take aspirin and Brilinta this AM.   Past Medical History:  Diagnosis Date   Goals of care, counseling/discussion 03/02/2021   HLD (hyperlipidemia)    Hypertension    Stroke [facility])     Past Surgical History:  Procedure Laterality Date   APPENDECTOMY     BIOPSY  01/20/2021   Procedure: BIOPSY;  Surgeon: Arta Silence, MD;  Location: [facility] ENDOSCOPY;  Service: Endoscopy;;   COLONOSCOPY WITH PROPOFOL Left 01/20/2021   Procedure: COLONOSCOPY WITH PROPOFOL;  Surgeon: Arta Silence, MD;  Location: [facility] ENDOSCOPY;  Service: Endoscopy;   Laterality: Left;   ESOPHAGOGASTRODUODENOSCOPY N/A 01/19/2021   Procedure: ESOPHAGOGASTRODUODENOSCOPY (EGD);  Surgeon: Arta Silence, MD;  Location: [facility];  Service: Endoscopy;  Laterality: N/A;   gastric ulcer surgery     IR CT HEAD LTD  12/13/2020   IR INTRAVSC STENT CERV CAROTID W/EMB-PROT MOD SED INCL ANGIO  12/14/2020   IR PERCUTANEOUS ART THROMBECTOMY/INFUSION INTRACRANIAL INC DIAG ANGIO  12/13/2020   IR US GUIDE VASC ACCESS RIGHT  12/13/2020   LAPAROSCOPIC RIGHT HEMI COLECTOMY N/A 01/25/2021   Procedure: LAPAROSCOPIC RIGHT COLECTOMY;  Surgeon: Ralene Ok, MD;  Location: [facility] ORS;  Service: General;  Laterality: N/A;   RADIOLOGY WITH ANESTHESIA N/A 12/13/2020   Procedure: IR WITH ANESTHESIA;  Surgeon: Luanne Bras, MD;  Location: [place];  Service: Radiology;  Laterality: N/A;    Allergies: Ibuprofen, Sulfa antibiotics, and Sulfamethoxazole  Medications: Prior to Admission medications   Medication Sig Start Date End Date Taking? Authorizing Provider  acetaminophen (TYLENOL) 325 MG tablet Take 2 tablets (650 mg total) by mouth every 4 (four) hours as needed for mild pain (or temp > 37.5 C (99.5 F)). Patient taking differently: Take 1,000 mg by mouth every 4 (four) hours as needed for mild pain (or temp > 37.5 C (99.5 F)). 12/17/20   Bailey-Modzik, Mayo Ao A, NP  aspirin 81 MG chewable tablet Chew 1 tablet (81 mg total) by mouth daily. 12/18/20   Bailey-Modzik, Delila A, NP  atorvastatin (LIPITOR) 20 MG tablet Take 1 tablet (20 mg total) by mouth daily. 02/15/21   Frann Rider, NP  Kary Kos  90 MG TABS tablet Take 90 mg by mouth 2 (two) times daily. 03/24/21   [provider]  folic acid (FOLVITE) 1 MG tablet Take 1 mg by mouth daily. 03/10/21   [provider]     Family History  Problem Relation Age of Onset   Hypertension Mother    Hypertension Father     Social History   Socioeconomic History   Marital status: Unknown    Spouse name: Not on file    Number of children: Not on file   Years of education: Not on file   Highest education level: Not on file  Occupational History   Not on file  Tobacco Use   Smoking status: Former    Packs/day: 2.00    Years: 48.00    Pack years: 96.00    Types: Cigarettes    Quit date: 12/13/2020    Years since quitting: 0.2   Smokeless tobacco: Never  Vaping Use   Vaping Use: Never used  Substance and Sexual Activity   Alcohol use: Yes    Comment: weekly - quit 12/13/20   Drug use: Yes    Types: Marijuana, Cocaine    Comment: last use 12/13/20 for both   Sexual activity: Yes  Other Topics Concern   Not on file  Social History Narrative   Not on file   Social Determinants of Health   Financial Resource Strain: Not on file  Food Insecurity: Not on file  Transportation Needs: Not on file  Physical Activity: Not on file  Stress: Not on file  Social Connections: Not on file     Review of Systems: A 12 point ROS discussed and pertinent positives are indicated in the HPI above.  All other systems are negative.  Review of Systems  Constitutional:  Negative for fatigue and fever.  Respiratory:  Negative for cough and shortness of breath.   Cardiovascular:  Negative for chest pain.  Gastrointestinal:  Negative for abdominal pain, nausea and vomiting.  Musculoskeletal:  Negative for back pain.  Psychiatric/Behavioral:  Negative for behavioral problems and confusion.    Vital Signs: There were no vitals taken for this visit.  Physical Exam Vitals and nursing note reviewed.  Constitutional:      General: He is not in acute distress.    Appearance: Normal appearance. He is not ill-appearing.  HENT:     Mouth/Throat:     Mouth: Mucous membranes are moist.     Pharynx: Oropharynx is clear.  Cardiovascular:     Rate and Rhythm: Normal rate and regular rhythm.  Pulmonary:     Effort: Pulmonary effort is normal.     Breath sounds: Normal breath sounds.  Abdominal:     General: Abdomen is  flat.     Palpations: Abdomen is soft.  Skin:    General: Skin is warm and dry.  Neurological:     Mental Status: He is alert. Mental status is at baseline.     Motor: Weakness (right hand weakness, decreased hand grip strength) present.  Psychiatric:        Mood and Affect: Mood normal.        Behavior: Behavior normal.        Thought Content: Thought content normal.        Judgment: Judgment normal.         Imaging: No results found.  Labs:  CBC: Recent Labs    02/02/21 0333 02/04/21 0400 02/07/21 0622 03/02/21 1048  WBC 10.6* 12.1*  10.9* 6.8  HGB 9.3* 8.8* 9.4* 11.6*  HCT 27.9* 27.6* 28.3* 35.5*  PLT 386 396 435* 249    COAGS: Recent Labs    12/13/20 0151 01/17/21 1444 03/31/21 0652  INR 0.9 0.9 0.9  APTT 28  --   --     BMP: Recent Labs    02/05/21 0310 02/06/21 0416 02/07/21 0622 03/02/21 1048  NA 137 138 134* 136  K 4.1 4.3 4.3 4.2  CL 107 106 103 100  CO2 _0 GLUCOSE 119* 120* 110* 108*  BUN _1 CALCIUM 8.8* 9.1 8.9 10.1  CREATININE 0.68 0.88 0.90 1.00  GFRNONAA >60 >60 >60 >60    LIVER FUNCTION TESTS: Recent Labs    02/02/21 0333 02/03/21 0321 02/07/21 0622 03/02/21 1048  BILITOT 0.4 0.3 0.5 0.3  AST 17 16 92* 23  ALT 39 31 174* 33  ALKPHOS 119 107 273* 141*  PROT 6.7 6.8 7.5 7.8  ALBUMIN 3.2* 3.0* 3.2* 4.7    TUMOR MARKERS: No results for input(s): AFPTM, CEA, CA199, CHROMGRNA in the last 8760 hours.  Assessment and Plan: Patient with past medical history of stroke, left carotid stenosis s/p stent placement presents with complaint of right carotid stenosis.   Case reviewed by Dr. Dr. Karenann Cai  who approves patient for procedure.  Patient presents today in their usual state of health.  He has been NPO and is not currently on blood thinners.  He did take aspirin 69m, Brilinta 919m and received a dose of Plavix 7524mhis AM.  Reviewed with Dr. de Karenann CaiRisks and benefits of  cerebral angiogram with intervention were discussed with the patient including, but not limited to bleeding, infection, vascular injury, contrast induced renal failure, stroke or even death.  This interventional procedure involves the use of X-rays and because of the nature of the planned procedure, it is possible that we will have prolonged use of X-ray fluoroscopy.  Potential radiation risks to you include (but are not limited to) the following: - A slightly elevated risk for cancer  several years later in life. This risk is typically less than 0.5% percent. This risk is low in comparison to the normal incidence of human cancer, which is 33% for women and 50% for men according to the [place] Radiation induced injury can include skin redness, resembling a rash, tissue breakdown / ulcers and hair loss (which can be temporary or permanent).   The likelihood of either of these occurring depends on the difficulty of the procedure and whether you are sensitive to radiation due to previous procedures, disease, or genetic conditions.   IF your procedure requires a prolonged use of radiation, you will be notified and given written instructions for further action.  It is your responsibility to monitor the irradiated area for the 2 weeks following the procedure and to notify your physician if you are concerned that you have suffered a radiation induced injury.    All of the patient's questions were answered, patient is agreeable to proceed.  Consent signed and in chart.  Thank you for this interesting consult.  I greatly enjoyed meeting TonSHIV SHUEYd look forward to participating in their care.  A copy of this report was sent to the requesting provider on this date.  Electronically Signed: KacDocia BarrierA 03/31/2021, 7:33 AM   I spent a total of  30 Minutes   in face to face  in clinical consultation, greater than 50% of which was counseling/coordinating care for  right carotid stenosis.

## 2021-03-31 NOTE — Anesthesia Postprocedure Evaluation (Signed)
Anesthesia Post Note  Patient: Dustin Golden  Procedure(s) Performed: STENTING     Patient location during evaluation: PACU Anesthesia Type: MAC Level of consciousness: awake and alert Pain management: pain level controlled Vital Signs Assessment: post-procedure vital signs reviewed and stable Respiratory status: spontaneous breathing, nonlabored ventilation and respiratory function stable Cardiovascular status: blood pressure returned to baseline and stable Postop Assessment: no apparent nausea or vomiting Anesthetic complications: no   No notable events documented.  Last Vitals:  Vitals:   03/31/21 1445 03/31/21 1500  BP: 109/68 114/71  Pulse: 73 90  Resp: (!) 28 16  Temp:    SpO2: 95% 96%    Last Pain:  Vitals:   03/31/21 1429  TempSrc:   PainSc: 10-Worst pain ever                 Pervis Hocking

## 2021-03-31 NOTE — Anesthesia Preprocedure Evaluation (Addendum)
Anesthesia Evaluation  Patient identified by MRN, date of birth, ID band Patient awake    Reviewed: Allergy & Precautions, NPO status , Patient's Chart, lab work & pertinent test results  Airway Mallampati: II  TM Distance: >3 FB Neck ROM: Full    Dental no notable dental hx. (+) Teeth Intact, Missing, Dental Advisory Given,    Pulmonary former smoker,  Quit smoking Feb 2022, 96 pack year history    Pulmonary exam normal breath sounds clear to auscultation       Cardiovascular hypertension (160/92 in preop), Pt. on medications +CHF (LVEF 02-11%, grade 1 diastolic dysfunction)  Normal cardiovascular exam Rhythm:Regular Rate:Normal  Echo 11/2020: 1. Left ventricular ejection fraction, by estimation, is 35 to 40%. The  left ventricle has moderately decreased function. The left ventricle  demonstrates global hypokinesis. There is moderate concentric left  ventricular hypertrophy. Left ventricular  diastolic parameters are consistent with Grade I diastolic dysfunction  (impaired relaxation).  2. Right ventricular systolic function is mildly reduced. The right  ventricular size is mildly enlarged. There is normal pulmonary artery  systolic pressure. The estimated right ventricular systolic pressure is  15.5 mmHg.  3. The mitral valve is normal in structure. Trivial mitral valve  regurgitation. No evidence of mitral stenosis. Moderate mitral annular  calcification.  4. The aortic valve is normal in structure. Aortic valve regurgitation is  not visualized. No aortic stenosis is present.  5. The inferior vena cava is normal in size with greater than 50%  respiratory variability, suggesting right atrial pressure of 3 mmHg.    Neuro/Psych CVA (L MCA stroke, on brillinta) negative psych ROS   GI/Hepatic PUD, (+)     substance abuse  cocaine use and marijuana use,   Endo/Other  negative endocrine ROS  Renal/GU negative Renal  ROS  negative genitourinary   Musculoskeletal negative musculoskeletal ROS (+)   Abdominal   Peds negative pediatric ROS (+)  Hematology negative hematology ROS (+) 11.6/35.5 in May 2022   Anesthesia Other Findings   Reproductive/Obstetrics negative OB ROS                            Anesthesia Physical Anesthesia Plan  ASA: 3  Anesthesia Plan: MAC   Post-op Pain Management:    Induction:   PONV Risk Score and Plan: 2 and Midazolam, Treatment may vary due to age or medical condition, Propofol infusion and TIVA  Airway Management Planned: Natural Airway and Simple Face Mask  Additional Equipment: Arterial line  Intra-op Plan:   Post-operative Plan:   Informed Consent: I have reviewed the patients History and Physical, chart, labs and discussed the procedure including the risks, benefits and alternatives for the proposed anesthesia with the patient or authorized representative who has indicated his/her understanding and acceptance.     Dental advisory given  Plan Discussed with: CRNA  Anesthesia Plan Comments:        Anesthesia Quick Evaluation

## 2021-03-31 NOTE — Transfer of Care (Signed)
Immediate Anesthesia Transfer of Care Note  Patient: Dustin Golden  Procedure(s) Performed: STENTING  Patient Location: PACU  Anesthesia Type:MAC  Level of Consciousness: drowsy and patient cooperative  Airway & Oxygen Therapy: Patient Spontanous Breathing  Post-op Assessment: Report given to RN and Post -op Vital signs reviewed and stable  Post vital signs: Reviewed and stable  Last Vitals:  Vitals Value Taken Time  BP 107/81 03/31/21 1140  Temp    Pulse 82 03/31/21 1145  Resp 17 03/31/21 1145  SpO2 100 % 03/31/21 1145  Vitals shown include unvalidated device data.  Last Pain:  Vitals:   03/31/21 0721  TempSrc:   PainSc: 6       Patients Stated Pain Goal: 2 (62/69/48 5462)  Complications: No notable events documented.

## 2021-04-01 ENCOUNTER — Telehealth: Payer: Self-pay | Admitting: Adult Health

## 2021-04-01 ENCOUNTER — Encounter (HOSPITAL_COMMUNITY): Payer: Self-pay | Admitting: Neuroradiology

## 2021-04-01 DIAGNOSIS — I6521 Occlusion and stenosis of right carotid artery: Secondary | ICD-10-CM | POA: Diagnosis not present

## 2021-04-01 DIAGNOSIS — I6523 Occlusion and stenosis of bilateral carotid arteries: Secondary | ICD-10-CM | POA: Diagnosis present

## 2021-04-01 HISTORY — DX: Occlusion and stenosis of bilateral carotid arteries: I65.23

## 2021-04-01 MED ORDER — ASPIRIN EC 81 MG PO TBEC
81.0000 mg | DELAYED_RELEASE_TABLET | Freq: Every day | ORAL | Status: DC
  Administered 2021-04-01: 81 mg via ORAL
  Filled 2021-04-01: qty 1

## 2021-04-01 MED ORDER — TICAGRELOR 90 MG PO TABS
90.0000 mg | ORAL_TABLET | Freq: Once | ORAL | Status: AC
  Administered 2021-04-01: 90 mg via ORAL
  Filled 2021-04-01: qty 1

## 2021-04-01 NOTE — Telephone Encounter (Signed)
Pt's daughter called stating that the pt is experiencing pain on his R side and they were informed by the hosp to schedule his appt sooner. Please advise.

## 2021-04-01 NOTE — Discharge Summary (Signed)
Patient ID: Dustin Golden MRN: [id number] DOB/AGE: Jun 21, 1956 65 y.o.  Admit date: 03/31/2021 Discharge date: 04/01/2021  Supervising Physician: [name]  Patient Status: [facility] - In-pt  Admission Diagnoses: Right carotid artery stenosis  Discharge Diagnoses:  Active Problems:   Carotid arterial disease [facility])   Discharged Condition: good  Hospital Course: [name] presented to [facility] as an outpatient on 03/31/21 for planned cerebral angiogram with angioplasty and stent placement within the right carotid artery due to 80-90% stenosis at the bifurcation. The procedure was performed under general anesthesia without immediate complication and the patient was admitted to 4N ICU for overnight observation. Post procedure the patient reported diffuse non focal pain which required a one time dose of opiate medication which was effective. He reports a history of same, mostly on the right side, which waxes and wanes and is followed by neurology on an outpatient basis.   No notable events overnight per chart review and discussion with RN. Patient denies any complaints this morning, he ate a big breakfast without issue. He is asking to see where the stent is and to discuss the procedure outcomes with his daughter which was done by Dr Tennis Must Sindy Messing via phone.  Plan for patient to be discharged home today - he will be scheduled for a bilateral carotid artery [place] in 3 months, he was instructed to continue Brilinta 90 mg BID and ASA 81 mg QD until he discusses the [place] results with Dr Tennis Must Sindy Messing. Return precautions reviewed, patient was encouraged to call [facility] with any questions or concerns prior to his follow up appointment. Patient understands and is agreeable to this plan.  Consults: None  Significant Diagnostic Studies: No results found.  Treatments: IV hydration  Discharge Exam: Blood pressure 108/70, pulse 90, temperature 97.8 F (36.6 C), temperature source Oral,  resp. rate 17, height 6\' 3"  (1.905 m), weight 191 lb (86.6 kg), SpO2 95 %. Physical Exam Vitals and nursing note reviewed.  Constitutional:      General: He is not in acute distress. HENT:     Head: Normocephalic.  Cardiovascular:     Rate and Rhythm: Normal rate.  Pulmonary:     Effort: Pulmonary effort is normal.  Abdominal:     Palpations: Abdomen is soft.  Skin:    General: Skin is warm and dry.  Neurological:     General: No focal deficit present.     Mental Status: He is alert and oriented to person, place, and time.  Psychiatric:        Mood and Affect: Mood normal.        Behavior: Behavior normal.        Thought Content: Thought content normal.        Judgment: Judgment normal.    Disposition:    Allergies as of 04/01/2021       Reactions   Ibuprofen Other (See Comments)   Nosebleeds   Sulfa Antibiotics Rash, Other (See Comments)   "Blisters to skin with cream"   Sulfamethoxazole Rash, Other (See Comments)   "Blisters to skin with cream"        Medication List     TAKE these medications    aspirin 81 MG chewable tablet Chew 1 tablet (81 mg total) by mouth daily.   atorvastatin 20 MG tablet Commonly known as: LIPITOR Take 1 tablet (20 mg total) by mouth daily.   Brilinta 90 MG Tabs tablet Generic drug: ticagrelor Take 90 mg by mouth  2 (two) times daily.   folic acid 1 MG tablet Commonly known as: FOLVITE Take 1 mg by mouth daily.       ASK your doctor about these medications    acetaminophen 325 MG tablet Commonly known as: TYLENOL Take 2 tablets (650 mg total) by mouth every 4 (four) hours as needed for mild pain (or temp > 37.5 C (99.5 F)).          Electronically Signed: Joaquim Nam, PA-C 04/01/2021, 9:35 AM   I have spent Less Than 30 Minutes discharging Dustin Golden.

## 2021-04-04 ENCOUNTER — Telehealth: Payer: Self-pay | Admitting: Adult Health

## 2021-04-04 NOTE — Telephone Encounter (Signed)
At prior visit, he did mention right-sided numbness/tingling but not specifically painful. Per review of IR notes during recent admission for stent placement, nonfocal pain mentioned requiring opiates with improvement. Is pain complete right side or just once specific area and what is a quality/characteristics of pain?  If this is been persistent since recent stent placement, I would advise him to contact their office to discuss further.

## 2021-04-04 NOTE — Telephone Encounter (Signed)
Also received message on 04/01/21: "Pt's daughter called stating that the pt is experiencing pain on his R side and they were informed by the hosp to schedule his appt sooner. Please advise."

## 2021-04-04 NOTE — Telephone Encounter (Signed)
Pt's daughter called wanting to know if the pt can be seen sooner due to the pt having to go to the ED again and being released on Friday. Daughter states that the ED recommended for the pt to be seen sooner. Please advise.

## 2021-04-04 NOTE — Telephone Encounter (Signed)
I called daughter back to get further clarification. She states pain located in R arm/leg and started 1-1.5 months ago. Pain worsened once he had R stent placement 03/31/21. He spoke w/ Radiology department after it was placed and they recommended he follow up with neurology about ongoing pain.  The pain is worse in his R arm than the leg. Described as shooting pain that is intermittent. He gets stiff and has to stay still until he can loosen up a little. He takes Tylenol prn but he wants to know what else is recommended.

## 2021-04-04 NOTE — Telephone Encounter (Signed)
Can schedule sooner visit if symptoms are stroke related - can discuss treating with medications such as gabapentin if needed. If not stroke related, would recommend he follow-up with a PCP for further pain management options

## 2021-04-04 NOTE — Telephone Encounter (Signed)
Spoke w/ JM,NP. Ok to work pt in [employer]. I called and spoke w/ daughter. Scheduled appt for tomorrow at 2:45pm, check in 2:15pm.

## 2021-04-04 NOTE — Telephone Encounter (Signed)
Called daughter back. States he had to go in for stent placement/R carotid on 03/31/21.  L side done Feb 2022. He is having intermittent shooting pains on R side since procedure. Usually occurs when he lays down at night/middle of the night/when he takes a nap. They instructed him to follow up w/ neurology. He is currently up and walking. Doing normal routine. I updated medication list on file. Advised I will discuss w/ JM,NP and call back.

## 2021-04-05 ENCOUNTER — Ambulatory Visit: Payer: Commercial Managed Care - PPO | Admitting: Adult Health

## 2021-04-05 ENCOUNTER — Encounter: Payer: Self-pay | Admitting: Adult Health

## 2021-04-05 VITALS — BP 130/93 | HR 93 | Ht 75.0 in | Wt 192.0 lb

## 2021-04-05 DIAGNOSIS — M792 Neuralgia and neuritis, unspecified: Secondary | ICD-10-CM

## 2021-04-05 DIAGNOSIS — I69398 Other sequelae of cerebral infarction: Secondary | ICD-10-CM

## 2021-04-05 DIAGNOSIS — I63232 Cerebral infarction due to unspecified occlusion or stenosis of left carotid arteries: Secondary | ICD-10-CM

## 2021-04-05 LAB — PLATELET INHIBITION P2Y12: Platelet Function  P2Y12: 2 [PRU] — ABNORMAL LOW (ref 182–335)

## 2021-04-05 MED ORDER — GABAPENTIN 300 MG PO CAPS: 300.0000 mg | ORAL_CAPSULE | Freq: Every day | ORAL | 5 refills | Status: DC

## 2021-04-05 NOTE — Patient Instructions (Signed)
Your Plan:  Start gabapentin 300mg  nightly for nerve pain     Thank you for coming to see [place] at [facility]. I hope we have been able to provide you high quality care today.  You may receive a patient satisfaction survey over the next few weeks. We would appreciate your feedback and comments so that we may continue to improve ourselves and the health of our patients.

## 2021-04-05 NOTE — Progress Notes (Signed)
[facility] [address] [place]. [place] [address] [phone]       STROKE FOLLOW UP NOTE  Dustin Golden Date of Birth:  [birth year] Medical Record Number:  [id number]   Reason for Referral:  stroke follow up    SUBJECTIVE:   CHIEF COMPLAINT:  Chief Complaint  Patient presents with   Follow-up    Rm 14 with spouse Dustin Golden  Pt returning due to pain and swelling in R hand, pain on whole R side.    HPI:   Today, 04/05/2021, Dustin Golden returns for acute visit accompanied by his significant other, Dustin Golden, due to right-sided pain over the past 1-1.5 months.  Describes right-sided pain as pins/needles sensation, electrical shock and tightness worse when working with therapy and at night.  He also endorses increased foot pain when he does not wear shoes.  He also notes swelling in his right hand which limits range of motion.  He has been working with therapy at [facility] making progress and does endorse increased use of right hand.  He has trialed Tylenol but with only limited benefit.  Denies any other associated symptoms or worsening stroke deficits.  Currently on aspirin and Brilinta with recent R ICA stent placement on 6/16 by Dr. Ladean Raya.  Remains on atorvastatin without associated side effects.  Blood pressure today 130/93.  No further concerns at this time.       History provided for reference purposes only Update 02/15/2021 JM: Dustin Golden is being seen for hospital follow up accompanied by his partner, Dustin Golden.  He was discharged home from CIR on 12/28/2020 after an 11-day stay.  Initially recovering well with minimal residual deficits but unfortunately he returned back to ED on 01/17/2021 with BRBPR with colonoscopy showing ulcerated cecal mass.  Underwent laparotomy with right hemicolectomy 4/12 with surgical pathology stage II adenocarcinoma with postop course complicated by protracted ileus on TPN.  Recommended decreasing aspirin to 81 mg  daily and discontinue Brilinta.  Eventually stabilized and placed on regular diet and was discharged home on 02/07/2021 after 21-day stay.  After prolonged hospitalization, he does report slight increase of right-sided numbness/tingling as well as weakness.  Residual dysarthria and right facial weakness stable and gradually improving.  Previously working with therapies at rehab without [facility] and plans on restarting OT today and PT/SLP in the near future. 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  He states he recently spoke to his employer who agreed to work with him in regards to limiting hours and returning on light duty such as paperwork once able.  He questions possible return in the next few weeks.  Has remained on aspirin without bruising and denies any additional bleeding Remains on lipitor 20mg  daily without associated side effects Blood pressure today 107/73 - monitors at home which has been stable  Reports complete cessation of tobacco, EtOH, cocaine and THC  Has not yet had follow-up with Dr. Karenann Cai   No further concerns at this time  Stroke admission 12/05/2020 Dustin Golden is a 65 y.o. male with history of tobacco abuse, alcohol use, and drug use who presented to ED on 12/05/2020  with aphasia and right sided hemiparesis with initial BWI20.  Personally reviewed as physician pertinent progress notes, lab work and imaging with summary provided.  Evaluated by Dr. Leonie Man with stroke work-up revealing left ICA and left  MCA/M2 stroke s/p IR with TICI 3 reperfusion likely secondary to arthrosclerosis vs cardioembolic. Delay angiogram showed near reocclusion. A carotid stent was deployed across the bifurcation with use of cerebral protection device followed by in stent angioplasty.   2D echo showed EF 30 to 40% with global hypokinesis, LVH and grade 1 diastolic dysfunction.  Placed on  aspirin and Brilinta.  Recommended 30-day cardiac event monitor outpatient to further evaluate for arrhythmias.  Elevated BP requiring Cleviprex and initiated amlodipine with long-term BP goal normotensive range.  LDL 109 and recommend initiation on simvastatin.  Pre-DM with A1c 6.8.  Other stroke risk factors include current tobacco use, EtOH use (placed on folic acid, thiamine and MV1), polysubstance abuse (THC and cocaine) and CHF.  Residual deficits of dysphagia, expressive aphasia, mild dysarthria, right lower facial weakness and mild right hemiparesis.  Evaluated by therapies and discharged to CIR for ongoing therapy needs.  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 ASPECTS 10.  CTA head & neck occlusion of the left ICA CT perfusion:16 cc completed infarction in the left frontoparietal junction region. Additional 42 cc at risk brain surrounding that region. MRI  Acute infarct left posterior MCA distribution involving the posterior insula in the left frontal parietal cortex. Small areas of acute infarct in the frontal lobes bilaterally. MRA There is moderate stenosis in the middle cerebral artery M1 segment bilaterally. Left internal carotid artery appears widely patent. 2D Echo: EF: 30-40%, left ventricle  demonstrates global hypokinesis, LVH, Grade 1 diastolic dysfunction LDL 496 HgbA1c 6.8 VTE prophylaxis - SCDs only for now Took occasional aspirin for pain prior to admission, was on Cangrelor x24 hours s/p thrombectomy Now on aspirin 81mg  and Brilinta 90mg  BID  Therapy recommendations: CIR Disposition:  CIR, Insurance approved, PM&R accepted patient and he was transferred to [place] on 3/4.       ROS:   14 system review of systems performed and negative with exception of those listed in HPI  PMH:  Past Medical History:  Diagnosis Date   Goals of care, counseling/discussion 03/02/2021   HLD  (hyperlipidemia)    Hypertension    Stroke ([place])     PSH:  Past Surgical History:  Procedure Laterality Date   APPENDECTOMY     BIOPSY  01/20/2021   Procedure: BIOPSY;  Surgeon: Arta Silence, MD;  Location: [facility];  Service: Endoscopy;;   COLONOSCOPY WITH PROPOFOL Left 01/20/2021   Procedure: COLONOSCOPY WITH PROPOFOL;  Surgeon: Arta Silence, MD;  Location: [facility];  Service: Endoscopy;  Laterality: Left;   ESOPHAGOGASTRODUODENOSCOPY N/A 01/19/2021   Procedure: ESOPHAGOGASTRODUODENOSCOPY (EGD);  Surgeon: Arta Silence, MD;  Location: [facility];  Service: Endoscopy;  Laterality: N/A;   gastric ulcer surgery     IR ANGIO INTRA EXTRACRAN SEL COM CAROTID INNOMINATE BILAT MOD SED  03/31/2021   IR ANGIO VERTEBRAL SEL SUBCLAVIAN INNOMINATE UNI L MOD SED  03/31/2021   IR ANGIO VERTEBRAL SEL VERTEBRAL UNI R MOD SED  03/31/2021   IR CT HEAD LTD  12/13/2020   IR INTRAVSC STENT CERV CAROTID W/EMB-PROT MOD SED INCL ANGIO  12/14/2020   IR INTRAVSC STENT CERV CAROTID W/EMB-PROT MOD SED INCL ANGIO  03/31/2021   IR PERCUTANEOUS ART THROMBECTOMY/INFUSION INTRACRANIAL INC DIAG ANGIO  12/13/2020   IR US GUIDE VASC ACCESS RIGHT  12/13/2020   IR US GUIDE VASC ACCESS RIGHT  03/31/2021   LAPAROSCOPIC RIGHT HEMI COLECTOMY N/A 01/25/2021  Procedure: LAPAROSCOPIC RIGHT COLECTOMY;  Surgeon: Ralene Ok, MD;  Location: [facility];  Service: General;  Laterality: N/A;   RADIOLOGY WITH ANESTHESIA N/A 12/13/2020   Procedure: IR WITH ANESTHESIA;  Surgeon: Luanne Bras, MD;  Location: [facility];  Service: Radiology;  Laterality: N/A;   RADIOLOGY WITH ANESTHESIA N/A 03/31/2021   Procedure: STENTING;  Surgeon: [name], MD;  Location: [facility];  Service: Radiology;  Laterality: N/A;    Social History:  Social History   Socioeconomic History   Marital status: Unknown    Spouse name: Not on file   Number of children: Not on file   Years of education: Not on file   Highest education level:  Not on file  Occupational History   Not on file  Tobacco Use   Smoking status: Former    Packs/day: 2.00    Years: 48.00    Pack years: 96.00    Types: Cigarettes    Quit date: 12/13/2020    Years since quitting: 0.3   Smokeless tobacco: Never  Vaping Use   Vaping Use: Never used  Substance and Sexual Activity   Alcohol use: Yes    Comment: weekly - quit 12/13/20   Drug use: Yes    Types: Marijuana, Cocaine    Comment: last use 12/13/20 for both   Sexual activity: Yes  Other Topics Concern   Not on file  Social History Narrative   Not on file   Social Determinants of Health   Financial Resource Strain: Not on file  Food Insecurity: Not on file  Transportation Needs: Not on file  Physical Activity: Not on file  Stress: Not on file  Social Connections: Not on file  Intimate Partner Violence: Not on file    Family History:  Family History  Problem Relation Age of Onset   Hypertension Mother    Hypertension Father     Medications:   Current Outpatient Medications on File Prior to Visit  Medication Sig Dispense Refill   acetaminophen (TYLENOL) 325 MG tablet Take 2 tablets (650 mg total) by mouth every 4 (four) hours as needed for mild pain (or temp > 37.5 C (99.5 F)). (Patient taking differently: Take 1,000 mg by mouth every 4 (four) hours as needed for mild pain (or temp > 37.5 C (99.5 F)).)     aspirin 81 MG chewable tablet Chew 1 tablet (81 mg total) by mouth daily.     atorvastatin (LIPITOR) 20 MG tablet Take 1 tablet (20 mg total) by mouth daily. 90 tablet 3   BRILINTA 90 MG TABS tablet Take 90 mg by mouth 2 (two) times daily.     folic acid (FOLVITE) 1 MG tablet Take 1 mg by mouth daily.     No current facility-administered medications on file prior to visit.    Allergies:   Allergies  Allergen Reactions   Ibuprofen Other (See Comments)    Nosebleeds    Sulfa Antibiotics Rash and Other (See Comments)    "Blisters to skin with cream"   Sulfamethoxazole  Rash and Other (See Comments)    "Blisters to skin with cream"      OBJECTIVE:  Physical Exam  Vitals:   04/05/21 1433  BP: (!) 130/93  Pulse: 93  Weight: 192 lb (87.1 kg)  Height: 6\' 3"  (1.905 m)   Body mass index is 24 kg/m. No results found.  General: well developed, well nourished,  very pleasant middle-aged African-American male, seated, in no evident distress Head:  head normocephalic and atraumatic.   Neck: supple with no carotid or supraclavicular bruits Cardiovascular: regular rate and rhythm, no murmurs Musculoskeletal: no deformity Skin:  no rash/petichiae Vascular:  Normal pulses all extremities   Neurologic Exam Mental Status: Awake and fully alert. Mild dysarthria. Unable to appreciate aphasia.  Oriented to place and time. Recent and remote memory intact. Attention span, concentration and fund of knowledge appropriate. Mood and affect appropriate.  Cranial Nerves: Pupils equal, briskly reactive to light. Extraocular movements full without nystagmus. Visual fields full to confrontation. Hearing intact. Facial sensation intact.  Right lower facial weakness.  Tongue and palate moves normally and symmetrically.  Motor: Normal bulk and tone. Normal strength in all tested extremity muscles except decreased right hand dexterity and grip strength as well as mild increased tone and swelling in right hand Sensory.: intact to touch , pinprick , position and vibratory sensation.   Coordination: Rapid alternating movements normal in all extremities except decreased right hand. Finger-to-nose and heel-to-shin showed mild right-sided apraxia Gait and Station: Arises from chair without difficulty. Stance is normal. Gait demonstrates normal stride length with mild imbalance without use of assistive device.  Mild difficulty performing tandem walk and heel toe.   Reflexes: 1+ and symmetric. Toes downgoing.           ASSESSMENT: POLK MINOR is a 65 y.o. year old male presented  with aphasia and right hemiparesis on 12/05/2020 with stroke work-up revealing left ICA and left MCA/M2 stroke s/p IR with TICI 3 reperfusion and reocclusion requiring left ICA stent and angioplasty likely secondary to arthrosclerosis versus cardioembolic. Vascular risk factors include HTN, HLD, tobacco use, EtOH use, THC and cocaine use.  Recent prolonged hospitalization 01/18/2020 for 21 days after presenting with GI bleed and diagnosed with stage II adenocarcinoma of colon s/p laparotomy with right hemicolectomy. S/p R ICA stent 03/31/2021 by Dr. Karenann Cai     PLAN:  L ICA and L MCA/M2 stroke :  Residual deficit: Right-sided sensory impairment and ataxia now with painful paresthesias, dysarthria and gait impairment.  Recommend initiating gabapentin 300 mg nightly for 1 week then consider increasing dosage if needed.  Discussed use of compression glove to help with right hand swelling and pain.  Continue working with therapies. Continue aspirin 81 mg daily  and atorvastatin for secondary stroke prevention.  May consider 30-day cardiac event monitor in the near future but would likely not be an AC candidate at this present time in setting of recent GI bleed Discussed secondary stroke prevention measures and importance of close PCP follow up for aggressive stroke risk factor management  Bilateral carotid stenosis: s/p L ICA stent 11/2020; s/p R ICA stent 03/2021.  Currently on Brilinta post stent placement and plans to repeat carotid duplex in 3 months with IR HTN: BP goal <130/90.  Currently well controlled on nonpharmacological management HLD: LDL goal <70.  On atorvastatin 20 mg daily managed by PCP.  Does report lab work by PCP which was satisfactory (unable to view via epic) Hx of Polysubstance abuse: Continue cessation    Follow-up as scheduled or call earlier if needed    CC:  [place] provider: Dr. Leonie Man PCP: Sabra Heck, [place], [place]    Frann Rider, [facility]  [facility] [address] [place], [place] [address]  Phone [phone] Fax [phone] Note: This document was prepared with digital dictation and possible smart phrase technology. Any transcriptional errors that result from this process are unintentional.

## 2021-04-06 ENCOUNTER — Telehealth: Payer: Self-pay | Admitting: Adult Health

## 2021-04-06 NOTE — Telephone Encounter (Signed)
Pt is asking for a call to discuss the type compression glove he should purhcase.

## 2021-04-06 NOTE — Telephone Encounter (Signed)
Contacted pt back, informed him that [place] doesn't have a certain brand recommendation for this, he stated he ordered one from [facility] and will see how it would go although she told him not to so he could get fitted for it. Will contact us with further questions if needed

## 2021-04-15 ENCOUNTER — Telehealth: Payer: Self-pay | Admitting: Adult Health

## 2021-04-15 NOTE — Progress Notes (Signed)
I agree with the above plan 

## 2021-04-15 NOTE — Telephone Encounter (Signed)
Pt called wanting to speak to the RN regarding his gabapentin (NEURONTIN) 300 MG capsule Please advise.

## 2021-04-19 NOTE — Telephone Encounter (Signed)
I called and LMVM for pt that returned call re: gabapentin.

## 2021-04-19 NOTE — Telephone Encounter (Signed)
Pt returned call to sandy. Please call back.

## 2021-04-20 NOTE — Telephone Encounter (Signed)
Called and LMVM for pt again to call us back.

## 2021-04-20 NOTE — Telephone Encounter (Signed)
Pt returned phone call. Would like a call back. 

## 2021-04-20 NOTE — Telephone Encounter (Signed)
I called the patient.  He stated the gabapentin is helping him sleep well at night.  He tolerates it okay.  He does notice some intermittent discomfort and a feeling of his "nerves heightened" on the right side during the day when he puts pressure on his right hand.  He states his grip is not as good as it was 1 month ago.  His hand is swollen and he feels this is limiting him.  He cannot sign with his right hand but he could at his last office visit on June 21.  He sees OT twice per week and states they are trying to help him with the grip and the swelling.  I asked him if he wanted to see what Janett Billow recommended for daytime discomfort however he said he thought he would be okay as he does not want to be drowsy during the day.  He went to work today for 4 hours. I let him know that a message would be sent to [facility] NP and if she has any suggestions/concerns we will call him back. The patient was very appreciative for the call and his questions were answered.

## 2021-04-20 NOTE — Telephone Encounter (Signed)
I called the patient back and LVM asking for call back tomorrow during office hours to discuss 1 recommendation from [facility] NP. Also advised I would send a mychart message to him. Left office number in message.

## 2021-04-20 NOTE — Telephone Encounter (Signed)
He may possibly try a compression glove at night and as needed during the day which can help with swelling as well as paresthesias.  He can further discuss this with OT to get their opinion.  Thank you.

## 2021-04-22 NOTE — Telephone Encounter (Signed)
Pt called asking recommendation from NP, informed Pt of previous telephone note of recommendation. Pt expressed verbally understanding.

## 2021-04-25 NOTE — Telephone Encounter (Signed)
Noted, thank you

## 2021-05-23 ENCOUNTER — Ambulatory Visit: Payer: Commercial Managed Care - PPO | Admitting: Adult Health

## 2021-05-23 ENCOUNTER — Encounter: Payer: Self-pay | Admitting: Adult Health

## 2021-05-23 ENCOUNTER — Telehealth: Payer: Self-pay | Admitting: Physical Medicine and Rehabilitation

## 2021-05-23 VITALS — BP 162/93 | HR 83 | Ht 75.0 in | Wt 199.0 lb

## 2021-05-23 DIAGNOSIS — I69398 Other sequelae of cerebral infarction: Secondary | ICD-10-CM

## 2021-05-23 DIAGNOSIS — I63232 Cerebral infarction due to unspecified occlusion or stenosis of left carotid arteries: Secondary | ICD-10-CM | POA: Diagnosis not present

## 2021-05-23 DIAGNOSIS — M792 Neuralgia and neuritis, unspecified: Secondary | ICD-10-CM

## 2021-05-23 NOTE — Telephone Encounter (Signed)
Received message from neurology that this patient is having right sidedpost-stroke pain and had missed follow-up with [place] due to re-hospitalization

## 2021-05-23 NOTE — Progress Notes (Signed)
[facility] [address] [place]. [place] [address] [phone]       STROKE FOLLOW UP NOTE  [name] Date of Birth:  Jan 24, 1956 Medical Record Number:  [id number]   Reason for Referral:  stroke follow up    SUBJECTIVE:   CHIEF COMPLAINT:  Chief Complaint  Patient presents with   Follow-up    Rm 3 alone Pt states things are going down hill, he cant do things with his R hand.      HPI:   Today, 05/23/2021, Dustin Golden returns for scheduled stroke follow-up.  Reports continued right hand swelling, decreased function and pain as well as right sided pins/needles sensation. Believes gradual worsening of right hand function since initial stroke (although noted worsening after prolonged hospitalization in April - 2 weeks after CIR d/c). Use of gabapentin 300 mg nightly -difficulty tolerating due to increased fatigue and worsening right sided paresthesias.  Continues to use Tylenol but with only limited benefit.  He is frustrated today as he continues to have difficulty functioning with his right hand especially as he has since returned back to work.  Previously working with OT at [facility] but since stopped due to difficulty tolerating exercises due to increased pain. Reports increased pain in evening.  He has not yet had follow-up with Dr. Ranell Patrick PMR.   Denies new stroke/TIA symptoms.  Compliant on aspirin, Brilinta and atorvastatin -denies associated side effects.  Blood pressure today 162/93. Blood pressure monitors at home and can fluctuate - followed by nephrology. Per pt, was told elevated blood pressure likely in setting of Brilinta use.     History provided for reference purposes only Update 04/05/2021 JM Dustin Golden returns for acute visit accompanied by his significant other, Dustin Golden, due to right-sided pain over the past 1-1.5 months.  Describes right-sided pain as pins/needles sensation, electrical shock and tightness worse when working with  therapy and at night.  He also endorses increased foot pain when he does not wear shoes.  He also notes swelling in his right hand which limits range of motion.  He has been working with therapy at [facility] making progress and does endorse increased use of right hand.  He has trialed Tylenol but with only limited benefit.  Denies any other associated symptoms or worsening stroke deficits.  Currently on aspirin and Brilinta with recent R ICA stent placement on 6/16 by Dr. Ladean Raya.  Remains on atorvastatin without associated side effects.  Blood pressure today 130/93.  No further concerns at this time.  Update 02/15/2021 JM: Dustin Golden is being seen for hospital follow up accompanied by his partner, Dustin Golden.  He was discharged home from CIR on 12/28/2020 after an 11-day stay.  Initially recovering well with minimal residual deficits but unfortunately he returned back to ED on 01/17/2021 with BRBPR with colonoscopy showing ulcerated cecal mass.  Underwent laparotomy with right hemicolectomy 4/12 with surgical pathology stage II adenocarcinoma with postop course complicated by protracted ileus on TPN.  Recommended decreasing aspirin to 81 mg daily and discontinue Brilinta.  Eventually stabilized and placed on regular diet and was discharged home on 02/07/2021 after 21-day stay.  After prolonged hospitalization, he does report slight increase of right-sided numbness/tingling as well as weakness.  Residual dysarthria and right facial weakness stable and gradually improving.  Previously working with therapies at rehab without [facility] and plans on restarting OT today and PT/SLP in the near future. 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  He states he recently spoke to his employer who agreed to work with him in regards to limiting hours and returning on light duty such as paperwork once able.  He questions  possible return in the next few weeks.  Has remained on aspirin without bruising and denies any additional bleeding Remains on lipitor '20mg'$  daily without associated side effects Blood pressure today 107/73 - monitors at home which has been stable  Reports complete cessation of tobacco, EtOH, cocaine and THC  Has not yet had follow-up with Dr. Karenann Cai   No further concerns at this time  Stroke admission 12/05/2020 Dustin Golden is a 65 y.o. male with history of tobacco abuse, alcohol use, and drug use who presented to ED on 12/05/2020  with aphasia and right sided hemiparesis with initial [id number].  Personally reviewed as physician pertinent progress notes, lab work and imaging with summary provided.  Evaluated by Dr. Leonie Man with stroke work-up revealing left ICA and left MCA/M2 stroke s/p IR with TICI 3 reperfusion likely secondary to arthrosclerosis vs cardioembolic. Delay angiogram showed near reocclusion. A carotid stent was deployed across the bifurcation with use of cerebral protection device followed by in stent angioplasty.   2D echo showed EF 30 to 40% with global hypokinesis, LVH and grade 1 diastolic dysfunction.  Placed on aspirin and Brilinta.  Recommended 30-day cardiac event monitor outpatient to further evaluate for arrhythmias.  Elevated BP requiring Cleviprex and initiated amlodipine with long-term BP goal normotensive range.  LDL 109 and recommend initiation on simvastatin.  Pre-DM with A1c 6.8.  Other stroke risk factors include current tobacco use, EtOH use (placed on folic acid, thiamine and MV1), polysubstance abuse (THC and cocaine) and CHF.  Residual deficits of dysphagia, expressive aphasia, mild dysarthria, right lower facial weakness and mild right hemiparesis.  Evaluated by therapies and discharged to CIR for ongoing therapy needs.  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  ASPECTS 10.  CTA head & neck occlusion of the left ICA CT perfusion:16 cc completed infarction in the left frontoparietal junction region. Additional 42 cc at risk brain surrounding that region. MRI  Acute infarct left posterior MCA distribution involving the posterior insula in the left frontal parietal cortex. Small areas of acute infarct in the frontal lobes bilaterally. MRA There is moderate stenosis in the middle cerebral artery M1 segment bilaterally. Left internal carotid artery appears widely patent. 2D Echo: EF: 30-40%, left ventricle  demonstrates global hypokinesis, LVH, Grade 1 diastolic dysfunction LDL 0000000 HgbA1c 6.8 VTE prophylaxis - SCDs only for now Took occasional aspirin for pain prior to admission, was on Cangrelor x24 hours s/p thrombectomy Now on aspirin '81mg'$  and Brilinta '90mg'$  BID  Therapy recommendations: CIR Disposition:  CIR, Insurance approved, PM&R accepted patient and he was transferred to [place] on 3/4.       ROS:   14 system review of systems performed and negative with exception of those listed in HPI  PMH:  Past Medical History:  Diagnosis Date   Goals of care, counseling/discussion 03/02/2021   HLD (hyperlipidemia)    Hypertension    Stroke ([place])     PSH:  Past Surgical History:  Procedure Laterality Date   APPENDECTOMY     BIOPSY  01/20/2021   Procedure: BIOPSY;  Surgeon: Arta Silence, MD;  Location: [facility];  Service: Endoscopy;;   COLONOSCOPY WITH PROPOFOL Left 01/20/2021   Procedure: COLONOSCOPY WITH PROPOFOL;  Surgeon: Arta Silence, MD;  Location: [facility];  Service: Endoscopy;  Laterality: Left;   ESOPHAGOGASTRODUODENOSCOPY N/A 01/19/2021   Procedure: ESOPHAGOGASTRODUODENOSCOPY (EGD);  Surgeon: Arta Silence, MD;  Location: [facility];  Service: Endoscopy;  Laterality: N/A;   gastric ulcer surgery     IR ANGIO INTRA EXTRACRAN SEL COM CAROTID INNOMINATE BILAT MOD SED  03/31/2021   IR ANGIO VERTEBRAL SEL SUBCLAVIAN INNOMINATE  UNI L MOD SED  03/31/2021   IR ANGIO VERTEBRAL SEL VERTEBRAL UNI R MOD SED  03/31/2021   IR CT HEAD LTD  12/13/2020   IR INTRAVSC STENT CERV CAROTID W/EMB-PROT MOD SED INCL ANGIO  12/14/2020   IR INTRAVSC STENT CERV CAROTID W/EMB-PROT MOD SED INCL ANGIO  03/31/2021   IR PERCUTANEOUS ART THROMBECTOMY/INFUSION INTRACRANIAL INC DIAG ANGIO  12/13/2020   IR US GUIDE VASC ACCESS RIGHT  12/13/2020   IR US GUIDE VASC ACCESS RIGHT  03/31/2021   LAPAROSCOPIC RIGHT HEMI COLECTOMY N/A 01/25/2021   Procedure: LAPAROSCOPIC RIGHT COLECTOMY;  Surgeon: Ralene Ok, MD;  Location: [facility];  Service: General;  Laterality: N/A;   RADIOLOGY WITH ANESTHESIA N/A 12/13/2020   Procedure: IR WITH ANESTHESIA;  Surgeon: Luanne Bras, MD;  Location: [facility];  Service: Radiology;  Laterality: N/A;   RADIOLOGY WITH ANESTHESIA N/A 03/31/2021   Procedure: STENTING;  Surgeon: [name], MD;  Location: [facility];  Service: Radiology;  Laterality: N/A;    Social History:  Social History   Socioeconomic History   Marital status: Unknown    Spouse name: Not on file   Number of children: Not on file   Years of education: Not on file   Highest education level: Not on file  Occupational History   Not on file  Tobacco Use   Smoking status: Former    Packs/day: 2.00    Years: 48.00    Pack years: 96.00    Types: Cigarettes    Quit date: 12/13/2020    Years since quitting: 0.4   Smokeless tobacco: Never  Vaping Use   Vaping Use: Never used  Substance and Sexual Activity   Alcohol use: Yes    Comment: weekly - quit 12/13/20   Drug use: Yes    Types: Marijuana, Cocaine    Comment: last use 12/13/20 for both   Sexual activity: Yes  Other Topics Concern   Not on file  Social History Narrative   Not on file   Social Determinants of Health   Financial Resource Strain: Not on file  Food Insecurity: Not on file  Transportation Needs: Not on file  Physical Activity: Not on file  Stress: Not on file   Social Connections: Not on file  Intimate Partner Violence: Not on file    Family History:  Family History  Problem Relation Age of Onset   Hypertension Mother    Hypertension Father     Medications:   Current Outpatient Medications on File Prior to Visit  Medication Sig Dispense Refill   acetaminophen (TYLENOL) 325 MG tablet Take 2 tablets (650 mg total) by mouth every 4 (four) hours as needed for mild pain (or temp > 37.5 C (99.5 F)). (Patient taking differently: Take 1,000 mg by mouth every 4 (four) hours as needed for mild pain (or temp > 37.5 C (99.5 F)).)     aspirin 81 MG chewable tablet Chew 1 tablet (81 mg total)  by mouth daily.     atorvastatin (LIPITOR) 20 MG tablet Take 1 tablet (20 mg total) by mouth daily. 90 tablet 3   BRILINTA 90 MG TABS tablet Take 90 mg by mouth 2 (two) times daily.     folic acid (FOLVITE) 1 MG tablet Take 1 mg by mouth daily.     gabapentin (NEURONTIN) 300 MG capsule Take 1 capsule (300 mg total) by mouth at bedtime. 30 capsule 5   No current facility-administered medications on file prior to visit.    Allergies:   Allergies  Allergen Reactions   Ibuprofen Other (See Comments)    Nosebleeds    Sulfa Antibiotics Rash and Other (See Comments)    "Blisters to skin with cream"   Sulfamethoxazole Rash and Other (See Comments)    "Blisters to skin with cream"      OBJECTIVE:  Physical Exam  Vitals:   05/23/21 0800  BP: (!) 162/93  Pulse: 83  Weight: 199 lb (90.3 kg)  Height: '6\' 3"'$  (1.905 m)    Body mass index is 24.87 kg/m. No results found.  General: well developed, well nourished, middle-aged African-American male, seated, in no evident distress although frustrated due to continued stroke deficits  Head: head normocephalic and atraumatic.   Neck: supple with no carotid or supraclavicular bruits Cardiovascular: regular rate and rhythm, no murmurs Musculoskeletal: no deformity Skin:  no rash/petichiae Vascular:  Normal  pulses all extremities   Neurologic Exam Mental Status: Awake and fully alert. Mild dysarthria. Unable to appreciate aphasia.  Oriented to place and time. Recent and remote memory intact. Attention span, concentration and fund of knowledge appropriate. Mood and affect appropriate.  Cranial Nerves: Pupils equal, briskly reactive to light. Extraocular movements full without nystagmus. Visual fields full to confrontation. Hearing intact. Facial sensation intact.  Right lower facial weakness.  Tongue and palate moves normally and symmetrically.  Motor: Normal bulk and tone. Normal strength in all tested extremity muscles except decreased right hand dexterity and grip strength as well as mild increased tone and swelling in right hand Sensory.: intact to touch , pinprick , position and vibratory sensation.   Coordination: Rapid alternating movements normal in all extremities except decreased right hand. Finger-to-nose and heel-to-shin showed mild right-sided apraxia Gait and Station: Arises from chair without difficulty. Stance is normal. Gait demonstrates normal stride length without use of assistive device.  Mild difficulty performing tandem walk and heel toe.   Reflexes: 1+ and symmetric. Toes downgoing.           ASSESSMENT: Dustin Golden is a 66 y.o. year old male with left ICA and MCA stroke 12/05/2020 s/p presented with aphasia and right hemiparesis on 12/05/2020 with stroke work-up revealing left ICA and left MCA/M2 stroke s/p IR with TICI 3 reperfusion and reocclusion requiring left ICA stent and angioplasty likely secondary to arthrosclerosis versus cardioembolic. Vascular risk factors include HTN, HLD, tobacco use, EtOH use, THC and cocaine use.  Recent prolonged hospitalization 01/17/2021 for 21 days after presenting with GI bleed and diagnosed with stage II adenocarcinoma of colon s/p laparotomy with right hemicolectomy. S/p R ICA stent 03/31/2021 by Dr. Karenann Cai     PLAN:  L  ICA and L MCA/M2 stroke :  Residual deficit: Right-sided sensory impairment with painful paresthesias, ataxia, right hand weakness with decreased functional use and swelling and dysarthria. Use of compression glove with minimal benefit (although swelling improved some since prior visit). Gabapentin made pain worse. Very frustrated at today's visit (  stated "your theory didn't work. My hand still isn't working right even with the swelling going down"). Discussed poststroke pain syndrome and difficulty treating.  Discussed further medication options but does not wish to try any more medications due to potential side effects.  He self discontinued OT due to making hand symptoms worse - pt provided OT notes from recent visits - will provide to medical records to scan into system. As I could not offer any further treatment then with restarting OT, use of medications and continued use of compression glove, he stated "so today's visit is just a waste of time if you cannot do anything for me". He was encouraged to contact PMR Dr. Ranell Patrick as he did not follow-up as advised and to discuss further options (?botox benefit?).   Continue aspirin 81 mg daily  and atorvastatin for secondary stroke prevention.  30-day cardiac event monitor not yet completed - previously on hold due to GI bleed 01/2021 - he does not wish to pursue further testing at this time Discussed secondary stroke prevention measures and importance of close PCP follow up for aggressive stroke risk factor management  Bilateral carotid stenosis: s/p L ICA stent 11/2020; s/p R ICA stent 03/2021.  Currently on Brilinta post stent placement and plans to repeat carotid duplex in 3 months with IR (~06/2021) HTN: BP goal <130/90.  Currently well controlled on nonpharmacological management HLD: LDL goal <70.  On atorvastatin 20 mg daily managed by PCP.   Hx of Polysubstance abuse: Continued cessation    Recommended follow-up with Dr. Leonie Man as he was also  frustrated that he has not yet been seen by an MD - he declined wanting f/u with our office as we were not able to further help his symptoms despite other options being offered - discussed indication for follow up with our office and for stroke prevention measures but he continued to decline follow-up    CC:  [facility] provider: Dr. Leonie Man PCP: Sabra Heck, [place], [place]   PMR: Dr. Ranell Patrick   I spent 32 minutes of face-to-face and non-face-to-face time with patient.  This included previsit chart review, lab review, study review, electronic health record documentation, patient education and discussion regarding history of prior stroke and residual deficits, further treatment options/interventions, secondary stroke prevention measures and importance of aggressive stroke risk factor management and answered all other questions to patient satisfaction   Frann Rider, AGNP-BC  [facility] [address] [place], [place] [address]  Phone [phone] Fax [phone] Note: This document was prepared with digital dictation and possible smart phrase technology. Any transcriptional errors that result from this process are unintentional.

## 2021-05-23 NOTE — Patient Instructions (Addendum)
Please schedule follow up visit with Dr. Ranell Patrick - physical medicine and rehab  Continue aspirin 81 mg daily and Brilinta (ticagrelor) 90 mg bid  and atorvastatin '20mg'$  daily for secondary stroke prevention  Continue to follow up with PCP regarding blood pressure, hyperlipidemia and diabetes management  Maintain strict control of hypertension with blood pressure goal below 130/90, diabetes with hemoglobin A1c goal below 7% and cholesterol with LDL cholesterol (bad cholesterol) goal below 70 mg/dL.        Thank you for coming to see [place] at [facility]. I hope we have been able to provide you high quality care today.  You may receive a patient satisfaction survey over the next few weeks. We would appreciate your feedback and comments so that we may continue to improve ourselves and the health of our patients.

## 2021-05-30 NOTE — Progress Notes (Signed)
I agree with the above plan 

## 2021-06-17 ENCOUNTER — Other Ambulatory Visit: Payer: Self-pay

## 2021-06-17 ENCOUNTER — Encounter: Payer: Self-pay | Admitting: Physical Medicine and Rehabilitation

## 2021-06-17 ENCOUNTER — Other Ambulatory Visit: Payer: Self-pay | Admitting: Physical Medicine and Rehabilitation

## 2021-06-17 ENCOUNTER — Encounter
Payer: Commercial Managed Care - PPO | Attending: Physical Medicine and Rehabilitation | Admitting: Physical Medicine and Rehabilitation

## 2021-06-17 VITALS — BP 131/89 | HR 98 | Temp 98.7°F | Ht 75.0 in | Wt 192.4 lb

## 2021-06-17 DIAGNOSIS — M7501 Adhesive capsulitis of right shoulder: Secondary | ICD-10-CM | POA: Diagnosis present

## 2021-06-17 DIAGNOSIS — M7502 Adhesive capsulitis of left shoulder: Secondary | ICD-10-CM

## 2021-06-17 DIAGNOSIS — I63512 Cerebral infarction due to unspecified occlusion or stenosis of left middle cerebral artery: Secondary | ICD-10-CM

## 2021-06-17 DIAGNOSIS — M79641 Pain in right hand: Secondary | ICD-10-CM | POA: Diagnosis present

## 2021-06-17 DIAGNOSIS — M79604 Pain in right leg: Secondary | ICD-10-CM

## 2021-06-17 MED ORDER — VITAMIN D (ERGOCALCIFEROL) 1.25 MG (50000 UNIT) PO CAPS: 50000.0000 [IU] | ORAL_CAPSULE | ORAL | 0 refills | Status: DC

## 2021-06-17 MED ORDER — BACLOFEN 10 MG PO TABS: 5.0000 mg | ORAL_TABLET | Freq: Three times a day (TID) | ORAL | 3 refills | Status: AC

## 2021-06-17 NOTE — Patient Instructions (Signed)
Foods that help pain:  1) Ginger (especially studied for arthritis)- reduce leukotriene production to decrease inflammation 2) Blueberries- high in phytonutrients that decrease inflammation 3) Salmon- marine omega-3s reduce joint swelling and pain 4) Pumpkin seeds- reduce inflammation 5) dark chocolate- reduces inflammation 6) turmeric- reduces inflammation 7) tart cherries - reduce pain and stiffness 8) extra virgin olive oil - its compound olecanthal helps to block prostaglandins  9) chili peppers- can be eaten or applied topically via capsaicin 10) mint- helpful for headache, muscle aches, joint pain, and itching 11) garlic- reduces inflammation  Link to further information on diet for chronic pain: [email or web address]

## 2021-06-17 NOTE — Progress Notes (Signed)
Subjective:    Patient ID: Kathe Mariner, male    DOB: [birth year], 65 y.o.   MRN: [id number]  HPI: FLYNN FREZZA is a 65 y.o. male who is here for Transitional Care Visit for the following, Left Middle Cerebral Artery Stroke, Essential Hypertension and Polysubstance Abuse.  He presented to [facility] via EMS on 12/13/2020 for right sided weakness. Neurology Following.  CT Head WO Contrast:  IMPRESSION: 1. Normal head CT.  CT Code Stroke CTA Head W/WO Contrast:  IMPRESSION: Occlusion of the left ICA at the origin. Reconstitution in the siphon probably from a combination of external to internal collaterals and patent communicating arteries. I cannot clearly identify the occluded MCA branch vessel, though there must be one, probably in the M3 region. 16 cc completed infarction in the left frontoparietal junction region. Additional 42 cc at risk brain surrounding that region.   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   30-50 % stenoses of both vertebral artery origins.  MR Brain WO Contrast:  IMPRESSION: 1. Acute infarct left posterior MCA distribution involving the posterior insula in the left frontal parietal cortex. Small areas of acute infarct in the frontal lobes bilaterally. 2. Motion degraded study. 3. MRA demonstrates loss of signal in the right posterior cerebral artery. This vessel was patent on CTA earlier today. Possible artifact versus thrombus. 4. There is moderate stenosis in the middle cerebral artery M1 segment bilaterally. Left internal carotid artery appears widely Patent.  On 12/13/2020 Urine Rapid Drug Screen was positive for Cocaine and THC.   Mr. Bresson underwent on 12/13/2020 Per Dr Leonie Man Note:   1.Left ICA and left MCA/M2 stroke likely secondary to vessel to vessel embolism from high-grade proximal left ICA stenosis treated with mechanical thrombectomy followed by rescue left internal  carotid artery and rescue stenting.  Mr. Beesley was maintained on aspirin and Brillinta for CVA Prophylaxis.  Mr. Bresson was admitted to inpatient rehabilitation on 12/17/2020 and discharged home on 12/28/2020. He is receiving Outpatient therapy at [facility]. He denies any pain at this time. He rates his pain 0. Also stated at the end of visit at times he has tingling in his right lower extremity.   Educated Mr. Reynoso about being compliant with his antihypertensive medication, he was argumentative about being placed on anti-hypertensive medication. Reviewed H&P and discussed the events which brought him into the hospital in detail. He doesn't have a PCP he was instructed to obtain a PCP he states he has insurance, discussed [employer]. He verbalizes understanding. Anti-hypertensive medication was refilled, to give him time to obtain a PCP he verbalizes understanding.   Spoke with Mr. Harder and his girlfriend in detail about being compliant with medication and what adverse events can occur they verbalize understanding.   1) Right hand pain and swelling He has been unable to tolerate PT due to the pain in his right hand. He has been wearing compression garment on the right hand -He found that Gabapentin heightens his pain  2) Limited range of motion and pain in shoulder  3) Right lower extremity tingling -worse when he is home from work in the evenings.  -positional.   Pain Inventory Average Pain 8 Pain Right Now 8 My pain is sharp, burning, tingling, and aching  LOCATION OF PAIN  na  BOWEL Number of stools per week: 7 Oral laxative use No  Type of laxative na Enema or  suppository use No  History of colostomy No  Incontinent No   BLADDER Normal In and out cath, frequency na Able to self cath  na Bladder incontinence No  Frequent urination No  Leakage with coughing No  Difficulty starting stream No  Incomplete bladder emptying No    Mobility walk  without assistance how many minutes can you walk? 30+ ability to climb steps?  yes do you drive?  no  Function employed # of hrs/week 20 not employed: date last employed . disabled: date disabled .  Neuro/Psych weakness numbness tingling spasms loss of taste or smell  Prior Studies Covid a month ago  Physicians involved in your care Any changes since last visit?  no   Family History  Problem Relation Age of Onset   Hypertension Mother    Hypertension Father    Social History   Socioeconomic History   Marital status: Unknown    Spouse name: Not on file   Number of children: Not on file   Years of education: Not on file   Highest education level: Not on file  Occupational History   Not on file  Tobacco Use   Smoking status: Former    Packs/day: 2.00    Years: 48.00    Pack years: 96.00    Types: Cigarettes    Quit date: 12/13/2020    Years since quitting: 0.5   Smokeless tobacco: Never  Vaping Use   Vaping Use: Never used  Substance and Sexual Activity   Alcohol use: Yes    Comment: weekly - quit 12/13/20   Drug use: Yes    Types: Marijuana, Cocaine    Comment: last use 12/13/20 for both   Sexual activity: Yes  Other Topics Concern   Not on file  Social History Narrative   Not on file   Social Determinants of Health   Financial Resource Strain: Not on file  Food Insecurity: Not on file  Transportation Needs: Not on file  Physical Activity: Not on file  Stress: Not on file  Social Connections: Not on file   Past Surgical History:  Procedure Laterality Date   APPENDECTOMY     BIOPSY  01/20/2021   Procedure: BIOPSY;  Surgeon: Arta Silence, MD;  Location: [facility];  Service: Endoscopy;;   COLONOSCOPY WITH PROPOFOL Left 01/20/2021   Procedure: COLONOSCOPY WITH PROPOFOL;  Surgeon: Arta Silence, MD;  [facility];  Service: Endoscopy;  Laterality: Left;   ESOPHAGOGASTRODUODENOSCOPY N/A 01/19/2021   Procedure: ESOPHAGOGASTRODUODENOSCOPY  (EGD);  Surgeon: Arta Silence, MD;  Location: [facility];  Service: Endoscopy;  Laterality: N/A;   gastric ulcer surgery     IR ANGIO INTRA EXTRACRAN SEL COM CAROTID INNOMINATE BILAT MOD SED  03/31/2021   IR ANGIO VERTEBRAL SEL SUBCLAVIAN INNOMINATE UNI L MOD SED  03/31/2021   IR ANGIO VERTEBRAL SEL VERTEBRAL UNI R MOD SED  03/31/2021   IR CT HEAD LTD  12/13/2020   IR INTRAVSC STENT CERV CAROTID W/EMB-PROT MOD SED INCL ANGIO  12/14/2020   IR INTRAVSC STENT CERV CAROTID W/EMB-PROT MOD SED INCL ANGIO  03/31/2021   IR PERCUTANEOUS ART THROMBECTOMY/INFUSION INTRACRANIAL INC DIAG ANGIO  12/13/2020   IR US GUIDE VASC ACCESS RIGHT  12/13/2020   IR US GUIDE VASC ACCESS RIGHT  03/31/2021   LAPAROSCOPIC RIGHT HEMI COLECTOMY N/A 01/25/2021   Procedure: LAPAROSCOPIC RIGHT COLECTOMY;  Surgeon: Ralene Ok, MD;  [facility]: [facility];  Service: General;  Laterality: N/A;   RADIOLOGY WITH ANESTHESIA N/A 12/13/2020  Procedure: IR WITH ANESTHESIA;  Surgeon: Luanne Bras, MD;  Location: [facility];  Service: Radiology;  Laterality: N/A;   RADIOLOGY WITH ANESTHESIA N/A 03/31/2021   Procedure: STENTING;  Surgeon: [name], MD;  Location: [facility];  Service: Radiology;  Laterality: N/A;   Past Medical History:  Diagnosis Date   Goals of care, counseling/discussion 03/02/2021   HLD (hyperlipidemia)    Hypertension    Stroke (HCC)    BP 131/89   Pulse 98   Temp 98.7 F (37.1 C) (Oral)   Ht '6\' 3"'$  (1.905 m)   Wt 192 lb 6.4 oz (87.3 kg)   SpO2 98%   BMI 24.05 kg/m   Opioid Risk Score:   Fall Risk Score:  `1  Depression screen PHQ 2/9  Depression screen [facility] 2/9 02/15/2021 01/05/2021  Decreased Interest 0 0  Down, Depressed, Hopeless 0 0  PHQ - 2 Score 0 0    Review of Systems  Constitutional:  Positive for unexpected weight change.  All other systems reviewed and are negative.     Objective:   Physical Exam Vitals and nursing note reviewed. BP 131/89, BMI 24.05 Constitutional:       Appearance: Normal appearance.  Cardiovascular:     Rate and Rhythm: Normal rate and regular rhythm.     Pulses: Normal pulses.     Heart sounds: Normal heart sounds.  Pulmonary:     Effort: Pulmonary effort is normal.     Breath sounds: Normal breath sounds.  Musculoskeletal:     Cervical back: Normal range of motion and neck supple.     Comments: Normal Muscle Bulk and Muscle Testing Reveals:  Upper Extremities: Full ROM and Muscle Strength 5/5  Lower Extremities: Full ROM and Muscle Strength 5/5 Right Lower Extremity Flexion Produced Tingling into his right lower extremity.  Arises from Table with ease Narrow Based  Gait   Right hand in compressive garment Bilateral shoulder range of motion limited Skin:    General: Skin is warm and dry.  Neurological:     Mental Status: He is alert and oriented to person, place, and time.  Psychiatric:        Mood and Affect: Mood normal.        Behavior: Behavior normal.         Assessment & Plan:  1. Left Middle Cerebral Artery Stroke: Continue Outpatient [facility] at [facility]. Continue Current medication regimen. He has a scheduled appointment with Neurology.  2., Essential Hypertension : Continue current medication regimen. Educated on medication compliance, see above  HPI note, he verbalizes understanding.  3. Polysubstance Abuse: Educated on cessation of illicit drug use, he verbalizes understanding.  4. Right hand and leg neuropathic pain: stop Gabapentin since heightening pain sensitivity.  -discussed cymbalta -discussed B6 supplement -decided on trialing Baclofen 5-'10mg'$  in the evening.  -Discussed current symptoms of pain and history of pain.  -Discussed benefits of exercise in reducing pain.] -Continue compression garment to right hand.  -Discussed following foods that may reduce pain: 1) Ginger (especially studied for arthritis)- reduce leukotriene production to decrease inflammation 2) Blueberries-  high in phytonutrients that decrease inflammation 3) Salmon- marine omega-3s reduce joint swelling and pain 4) Pumpkin seeds- reduce inflammation 5) dark chocolate- reduces inflammation 6) turmeric- reduces inflammation 7) tart cherries - reduce pain and stiffness 8) extra virgin olive oil - its compound olecanthal helps to block prostaglandins  9) chili peppers- can be eaten or applied topically via capsaicin 10) mint-  helpful for headache, muscle aches, joint pain, and itching 11) garlic- reduces inflammation  Link to further information on diet for chronic pain: http://www.randall.com/   2) Signs of Vitamin D deficiency -supplement with ergocalciferol 50,000U once per week for 7 weeks -advised that this can help with pain as well as decrease COVID symptoms if he gets the virus again.

## 2021-06-17 NOTE — Addendum Note (Signed)
Addended by: Izora Ribas on: 06/17/2021 03:00 PM   Modules accepted: Orders

## 2021-06-29 ENCOUNTER — Other Ambulatory Visit (HOSPITAL_COMMUNITY): Payer: Self-pay | Admitting: Physician Assistant

## 2021-06-29 ENCOUNTER — Other Ambulatory Visit (HOSPITAL_COMMUNITY): Payer: Self-pay | Admitting: Neuroradiology

## 2021-06-29 DIAGNOSIS — I6300 Cerebral infarction due to thrombosis of unspecified precerebral artery: Secondary | ICD-10-CM

## 2021-06-29 DIAGNOSIS — I6523 Occlusion and stenosis of bilateral carotid arteries: Secondary | ICD-10-CM

## 2021-06-30 ENCOUNTER — Ambulatory Visit (HOSPITAL_BASED_OUTPATIENT_CLINIC_OR_DEPARTMENT_OTHER): Admission: RE | Admit: 2021-06-30 | Payer: Commercial Managed Care - PPO | Source: Ambulatory Visit

## 2021-06-30 ENCOUNTER — Other Ambulatory Visit: Payer: Self-pay

## 2021-06-30 ENCOUNTER — Inpatient Hospital Stay: Payer: Commercial Managed Care - PPO | Attending: Hematology & Oncology

## 2021-06-30 ENCOUNTER — Encounter: Payer: Self-pay | Admitting: *Deleted

## 2021-06-30 ENCOUNTER — Encounter: Payer: Self-pay | Admitting: Hematology & Oncology

## 2021-06-30 ENCOUNTER — Inpatient Hospital Stay (HOSPITAL_BASED_OUTPATIENT_CLINIC_OR_DEPARTMENT_OTHER): Payer: Commercial Managed Care - PPO | Admitting: Hematology & Oncology

## 2021-06-30 ENCOUNTER — Encounter (HOSPITAL_BASED_OUTPATIENT_CLINIC_OR_DEPARTMENT_OTHER): Payer: Self-pay

## 2021-06-30 VITALS — BP 139/91 | HR 88 | Temp 98.2°F | Resp 17 | Wt 193.0 lb

## 2021-06-30 DIAGNOSIS — Z8673 Personal history of transient ischemic attack (TIA), and cerebral infarction without residual deficits: Secondary | ICD-10-CM | POA: Insufficient documentation

## 2021-06-30 DIAGNOSIS — C182 Malignant neoplasm of ascending colon: Secondary | ICD-10-CM | POA: Insufficient documentation

## 2021-06-30 DIAGNOSIS — D573 Sickle-cell trait: Secondary | ICD-10-CM | POA: Insufficient documentation

## 2021-06-30 DIAGNOSIS — Z882 Allergy status to sulfonamides status: Secondary | ICD-10-CM | POA: Insufficient documentation

## 2021-06-30 DIAGNOSIS — Z886 Allergy status to analgesic agent status: Secondary | ICD-10-CM | POA: Diagnosis not present

## 2021-06-30 DIAGNOSIS — Z79899 Other long term (current) drug therapy: Secondary | ICD-10-CM | POA: Diagnosis not present

## 2021-06-30 DIAGNOSIS — C189 Malignant neoplasm of colon, unspecified: Secondary | ICD-10-CM

## 2021-06-30 LAB — CMP (CANCER CENTER ONLY)
ALT: 14 U/L (ref 0–44)
AST: 15 U/L (ref 15–41)
Albumin: 4.7 g/dL (ref 3.5–5.0)
Alkaline Phosphatase: 110 U/L (ref 38–126)
Anion gap: 9 (ref 5–15)
BUN: 12 mg/dL (ref 8–23)
CO2: 26 mmol/L (ref 22–32)
Calcium: 9.6 mg/dL (ref 8.9–10.3)
Chloride: 103 mmol/L (ref 98–111)
Creatinine: 1.02 mg/dL (ref 0.61–1.24)
GFR, Estimated: >60 mL/min (ref >60)
Glucose, Bld: 91 mg/dL (ref 70–99)
Potassium: 3.7 mmol/L (ref 3.5–5.1)
Sodium: 138 mmol/L (ref 135–145)
Total Bilirubin: 0.5 mg/dL (ref 0.3–1.2)
Total Protein: 7.6 g/dL (ref 6.5–8.1)

## 2021-06-30 LAB — CBC WITH DIFFERENTIAL (CANCER CENTER ONLY)
Abs Immature Granulocytes: 0.01 10*3/uL (ref 0.00–0.07)
Basophils Absolute: 0.1 10*3/uL (ref 0.0–0.1)
Basophils Relative: 1 %
Eosinophils Absolute: 0.1 10*3/uL (ref 0.0–0.5)
Eosinophils Relative: 2 %
HCT: 36.6 % — ABNORMAL LOW (ref 39.0–52.0)
Hemoglobin: 12 g/dL — ABNORMAL LOW (ref 13.0–17.0)
Immature Granulocytes: 0 %
Lymphocytes Relative: 50 %
Lymphs Abs: 3.5 10*3/uL (ref 0.7–4.0)
MCH: 24.5 pg — ABNORMAL LOW (ref 26.0–34.0)
MCHC: 32.8 g/dL (ref 30.0–36.0)
MCV: 74.8 fL — ABNORMAL LOW (ref 80.0–100.0)
Monocytes Absolute: 0.5 10*3/uL (ref 0.1–1.0)
Monocytes Relative: 7 %
Neutro Abs: 2.8 10*3/uL (ref 1.7–7.7)
Neutrophils Relative %: 40 %
Platelet Count: 226 10*3/uL (ref 150–400)
RBC: 4.89 MIL/uL (ref 4.22–5.81)
RDW: 16.4 % — ABNORMAL HIGH (ref 11.5–15.5)
WBC Count: 6.9 10*3/uL (ref 4.0–10.5)
nRBC: 0 % (ref 0.0–0.2)

## 2021-07-01 ENCOUNTER — Telehealth: Payer: Self-pay

## 2021-07-01 LAB — CEA (IN HOUSE-CHCC): CEA (CHCC-In House): 1.02 ng/mL (ref 0.00–5.00)

## 2021-07-04 ENCOUNTER — Ambulatory Visit: Payer: Commercial Managed Care - PPO | Attending: Physical Medicine and Rehabilitation | Admitting: Occupational Therapy

## 2021-07-04 ENCOUNTER — Other Ambulatory Visit: Payer: Self-pay

## 2021-07-04 DIAGNOSIS — M25511 Pain in right shoulder: Secondary | ICD-10-CM | POA: Insufficient documentation

## 2021-07-04 DIAGNOSIS — G8929 Other chronic pain: Secondary | ICD-10-CM | POA: Insufficient documentation

## 2021-07-04 DIAGNOSIS — M6281 Muscle weakness (generalized): Secondary | ICD-10-CM | POA: Diagnosis present

## 2021-07-04 DIAGNOSIS — R278 Other lack of coordination: Secondary | ICD-10-CM | POA: Insufficient documentation

## 2021-07-04 DIAGNOSIS — R208 Other disturbances of skin sensation: Secondary | ICD-10-CM | POA: Insufficient documentation

## 2021-07-04 NOTE — Therapy (Signed)
[facility] [address] [place], [place], [address] Phone: [phone]   Fax:  [phone]  Occupational Therapy Evaluation  Patient Details  Name: Dustin Golden MRN: [id number] Date of Birth: 08-Oct-1956 Referring Provider (OT): Leeroy Cha, MD   Encounter Date: 07/04/2021   OT End of Session - 07/04/21 1611     Visit Number 1    Number of Visits 13    Date for OT Re-Evaluation 08/29/21   12 visits over 8 weeks   Authorization Type [employer] Medicare    Authorization Time Period VL: 20 Hard Max    OT Start Time 9381    OT Stop Time 1615    OT Time Calculation (min) 40 min    Activity Tolerance Patient tolerated treatment well    Behavior During Therapy [facility] for tasks assessed/performed             Past Medical History:  Diagnosis Date   Goals of care, counseling/discussion 03/02/2021   HLD (hyperlipidemia)    Hypertension    Stroke [facility])     Past Surgical History:  Procedure Laterality Date   APPENDECTOMY     BIOPSY  01/20/2021   Procedure: BIOPSY;  Surgeon: Arta Silence, MD;  [facility];  Service: Endoscopy;;   COLONOSCOPY WITH PROPOFOL Left 01/20/2021   Procedure: COLONOSCOPY WITH PROPOFOL;  Surgeon: Arta Silence, MD;  [facility];  Service: Endoscopy;  Laterality: Left;   ESOPHAGOGASTRODUODENOSCOPY N/A 01/19/2021   Procedure: ESOPHAGOGASTRODUODENOSCOPY (EGD);  Surgeon: Arta Silence, MD;  Location: [facility];  Service: Endoscopy;  Laterality: N/A;   gastric ulcer surgery     IR ANGIO INTRA EXTRACRAN SEL COM CAROTID INNOMINATE BILAT MOD SED  03/31/2021   IR ANGIO VERTEBRAL SEL SUBCLAVIAN INNOMINATE UNI L MOD SED  03/31/2021   IR ANGIO VERTEBRAL SEL VERTEBRAL UNI R MOD SED  03/31/2021   IR CT HEAD LTD  12/13/2020   IR INTRAVSC STENT CERV CAROTID W/EMB-PROT MOD SED INCL ANGIO  12/14/2020   IR INTRAVSC STENT CERV CAROTID W/EMB-PROT MOD SED INCL ANGIO  03/31/2021   IR PERCUTANEOUS ART  THROMBECTOMY/INFUSION INTRACRANIAL INC DIAG ANGIO  12/13/2020   IR US GUIDE VASC ACCESS RIGHT  12/13/2020   IR US GUIDE VASC ACCESS RIGHT  03/31/2021   LAPAROSCOPIC RIGHT HEMI COLECTOMY N/A 01/25/2021   Procedure: LAPAROSCOPIC RIGHT COLECTOMY;  Surgeon: Ralene Ok, MD;  [facility];  Service: General;  Laterality: N/A;   RADIOLOGY WITH ANESTHESIA N/A 12/13/2020   Procedure: IR WITH ANESTHESIA;  Surgeon: Luanne Bras, MD;  Location: [facility];  Service: Radiology;  Laterality: N/A;   RADIOLOGY WITH ANESTHESIA N/A 03/31/2021   Procedure: STENTING;  Surgeon: [name], MD;  Location: [facility];  Service: Radiology;  Laterality: N/A;    There were no vitals filed for this visit.   Subjective Assessment - 07/04/21 1620     Subjective  Pt is a 65 year old male that presents to Neuro OPOT s/p L MCA CVA in February 2022 and experiencing adhesive capsulitis of both shoulders. Pt reports pain with driving and with bathing in RUE shoulder. Pt also has residual weakness and decreased coordination in RUE from CVA. PMH significant for HTN, polysubstance abuse, neuropathic pain and L MCA CVA. Pt lives independently and working part time . rtimary goal is to "get my arm (RUE) up like that (LUE)"    Pertinent History HTN, Polysubstance Abuse, Neuropathic Pain, L MCA CVA    Patient Stated  Goals "get my arm (RUE) up like that (LUE)"    Currently in Pain? No/denies   patient has no pain at rest but reports 8/10 pain in RUE shoulder during movement   Pain Score 0-No pain               [facility] OT Assessment - 07/04/21 1538       Assessment   Medical Diagnosis RUE shoulder pain    Referring Provider (OT) Leeroy Cha, MD    Onset Date/Surgical Date 12/13/20   date of CVA   Hand Dominance Right    Prior Therapy CIR      Precautions   Precautions None    Required Braces or Orthoses Other Brace/Splint    Other Brace/Splint has edema glove for RUE      Balance Screen   Has the  patient fallen in the past 6 months No      [place] expects to be discharged to: Private residence    Living Arrangements Alone    Type of [place] or work area in basement    Bathroom Shower/Tub [place] seat      Prior Function   Level of Independence Independent    Vocation Part time employment    Vocation Requirements stocking and receiving in [place]      ADL   Eating/Feeding --   eating with LUE primarily but has foam grip for RUE   Upper Body Bathing --   pain reported with reaching across body to wash under LUE arm   ADL comments Pt reports doing all ADLs with mod I.      IADL   Shopping Takes care of all shopping needs independently    Light Housekeeping Does personal laundry completely;Maintains house alone or with occasional assistance    Meal Prep Plans, prepares and serves adequate meals independently   safety concerns - reports cutting and burning RUE   Programmer, applications own vehicle   reports pain with RUE shoulder during driving   Medication Management Is responsible for taking medication in correct dosages at correct time    Physiological scientist financial matters independently (budgets, writes checks, pays rent, bills goes to bank), collects and keeps track of income      Written Expression   Dominant Hand Right    Handwriting 75% legible   of previous handwriting     Vision - History   Baseline Vision Wears glasses for distance only    Additional Comments pt denies any changes      Activity Tolerance   Activity Tolerance Tolerates 30 min activity with multiple rests      Cognition   Behaviors Other (comment)   verbose     Observation/Other Assessments   Focus on Therapeutic Outcomes (FOTO)  N/A      Sensation   Light Touch Appears Intact    Hot/Cold Impaired by gross assessment      Coordination   9 Hole Peg Test Right;Left     Right 9 Hole Peg Test 5 pegs in 2 minutes    Left 9 Hole Peg Test 28.63s    Box and Blocks R 27, L 48      ROM / Strength   AROM / PROM / Strength AROM;Strength      AROM   Overall AROM  Deficits    Overall AROM  Comments RUE deficits, LUE WFL    AROM Assessment Site Shoulder    Right/Left Shoulder Right;Left    Right Shoulder Flexion 85 Degrees    Right Shoulder ABduction 70 Degrees    Left Shoulder Flexion 140 Degrees    Left Shoulder ABduction 140 Degrees      Strength   Overall Strength Within functional limits for tasks performed      Hand Function   Right Hand Gross Grasp Functional    Right Hand Grip (lbs) 28.8    Left Hand Gross Grasp Functional    Left Hand Grip (lbs) 85.5                                OT Short Term Goals - 07/04/21 1633       OT SHORT TERM GOAL #1   Title Pt will be independent with HEP for RUE shoulder ROM, coordination and grip strength.    Time 4    Period Weeks    Status New    Target Date 08/01/21      OT SHORT TERM GOAL #2   Title Pt will report decreased pain in RUE shoulder while completing bathing or driving of 4/66 or less.    Baseline 8/10 pain in RUE shoulder during these tasks    Time 4    Period Weeks    Status New      OT SHORT TERM GOAL #3   Title Pt will increase range of motion of shoulder flexion in RUE to 100* or better with pain no greater than 7/10 for increasing ability to reach in cabinets.    Baseline 85* RUE shoulder flexion, pain 8/10    Time 4    Period Weeks    Status New      OT SHORT TERM GOAL #4   Title Pt wil verbalize understanding of adapted strategies for increasing safety and independence with ADLs and IADLs (chopping vegetables, peeling potatoes, etc)    Time 4    Period Weeks    Status New      OT SHORT TERM GOAL #5   Title Pt will verbalize understanding of sensory strategies for RUE.    Time 4    Period Weeks    Status New               OT Long Term Goals -  07/04/21 1639       OT LONG TERM GOAL #1   Title Pt will be independent with any updated HEPs    Time 8    Period Weeks    Status New    Target Date 08/29/21      OT LONG TERM GOAL #2   Title Pt will increase range of motion in RUE shoulder flexion to 110* for increasing ability to obtain lightweight object from mid level shelf with decreased pain.    Baseline 85* RUE shoulder flex    Time 8    Period Weeks    Status New      OT LONG TERM GOAL #3   Title Pt will increase abduction range of motion in RUE to 90* or greater for increasing ability to wash under arm and decrease pain in RUE.    Baseline 70*    Time 8    Period Weeks    Status New  Plan - 07/04/21 1611     Clinical Impression Statement Pt is a 65 year old male that presents to Neuro OPOT s/p L MCA CVA in February 2022 and experiencing adhesive capsulitis of both shoulders. Pt presents with deficits in ROM in RUE shoulder and with decreased strength, sensation deficits and coordination in RUE. Pt has significant pain in RUE with any movement at the shoulder. Skilled occupational therapy is recommended to target listed areas of deficit and increase independence with ADLs and IADLs and decrease pain.    OT Occupational Profile and History Problem Focused Assessment - Including review of records relating to presenting problem    Occupational performance deficits (Please refer to evaluation for details): IADL's;ADL's;Work;Leisure    Body Structure / Function / Physical Skills IADL;ROM;ADL;Decreased knowledge of use of DME;Strength;Pain;GMC;Sensation;UE functional use;Flexibility;Mobility;FMC    Cognitive Skills Attention;Safety Awareness    Rehab Potential Good    Clinical Decision Making Limited treatment options, no task modification necessary    Comorbidities Affecting Occupational Performance: None    Modification or Assistance to Complete Evaluation  No modification of tasks or assist necessary  to complete eval    OT Frequency 2x / week    OT Duration 8 weeks   12 sessions in 8 weeks d/t any scheduling conflicts   OT Treatment/Interventions Self-care/ADL training;Electrical Stimulation;Ultrasound;Moist Heat;Aquatic Therapy;Fluidtherapy;DME and/or AE instruction;Therapeutic activities;Therapeutic exercise;Neuromuscular education;Manual Therapy;Patient/family education;Passive range of motion;Functional Mobility Training;Cognitive remediation/compensation    Plan Needs HEP for pain free movement of RUE, adapted strategies for safety in kitchen, coordination HPE    OT Home Exercise Plan issued brown/tan foam grip for handwriting    Consulted and Agree with Plan of Care Patient             Patient will benefit from skilled therapeutic intervention in order to improve the following deficits and impairments:   Body Structure / Function / Physical Skills: IADL, ROM, ADL, Decreased knowledge of use of DME, Strength, Pain, GMC, Sensation, UE functional use, Flexibility, Mobility, [facility] Cognitive Skills: Attention, Safety Awareness     Visit Diagnosis: Chronic right shoulder pain  Other lack of coordination  Muscle weakness (generalized)  Other disturbances of skin sensation    Problem List Patient Active Problem List   Diagnosis Date Noted   Stenosis of both internal carotid arteries 04/01/2021   Carotid arterial disease ([place]) 03/31/2021   Goals of care, counseling/discussion 03/02/2021   Adenocarcinoma of colon ([place]) 01/28/2021   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 01/22/2021   Acute GI bleeding 01/19/2021   GIB (gastrointestinal bleeding) 01/17/2021   Hyperlipidemia 01/17/2021   History of stroke 01/17/2021   Hyponatremia    AKI (acute kidney injury) [facility])    Eye discharge    Essential hypertension    Left middle cerebral artery stroke ([place]) 12/17/2020   Stroke ([place]) 12/13/2020   Polysubstance (excluding opioids) dependence ([place])      Zachery Conch, OT/L 07/04/2021, 4:41 PM  [facility] [address] [place], [place], [address] Phone: [phone]   Fax:  [phone]  Name: CHANE COWDEN MRN: [id number] Date of Birth: [birth year]

## 2021-07-06 NOTE — Progress Notes (Signed)
Hematology and Oncology Follow Up Visit  Dustin Golden [id number] November 12, 1955 65 y.o. 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 daily  Baby aspirin/Brilinta/Lipitor     Interim History:  Dustin Golden is back for follow-up.  We initially saw him back in May.  Since then, he has been doing pretty well.  He has not yet had his surveillance CT scans.  I am unsure exactly what happened with this but we will have to get this set up.  He is feeling okay.  He has had no residual from the stroke that he had.  He has had no problems with bowels or bladder.  There is been no bleeding.  He has had no nausea or vomiting.  We did do a CEA level on him today.  It was 1.02.  He has had no cough or shortness of breath.  There has been no issues with COVID.  There is been no leg swelling.  He has had no headache.  There is no blurred vision.  Overall, I would say his performance status is ECOG 1.  Medications:  Current Outpatient Medications:    acetaminophen (TYLENOL) 325 MG tablet, Take 2 tablets (650 mg total) by mouth every 4 (four) hours as needed for mild pain (or temp > 37.5 C (99.5 F)). (Patient taking differently: Take 1,000 mg by mouth every 4 (four) hours as needed for mild pain (or temp > 37.5 C (99.5 F)).), Disp: , Rfl:    aspirin 81 MG chewable tablet, Chew 1 tablet (81 mg total) by mouth daily., Disp: , Rfl:    atorvastatin (LIPITOR) 20 MG tablet, Take 1 tablet (20 mg total) by mouth daily., Disp: 90 tablet, Rfl: 3   baclofen (LIORESAL) 10 MG tablet, Take 0.5 tablets (5 mg total) by mouth 3 (three) times daily., Disp: 90 tablet, Rfl: 3   BRILINTA 90 MG TABS tablet, Take 90 mg by mouth 2 (two) times daily., Disp: , Rfl:    folic acid (FOLVITE) 1 MG tablet, Take 1 mg by mouth daily., Disp: , Rfl:    Vitamin D, Ergocalciferol, (DRISDOL) 1.25 MG (50000 UNIT)  CAPS capsule, Take 1 capsule (50,000 Units total) by mouth every 7 (seven) days., Disp: 7 capsule, Rfl: 0   gabapentin (NEURONTIN) 300 MG capsule, Take 1 capsule (300 mg total) by mouth at bedtime. (Patient not taking: Reported on 06/30/2021), Disp: 30 capsule, Rfl: 5  Allergies:  Allergies  Allergen Reactions   Ibuprofen Other (See Comments)    Nosebleeds    Sulfa Antibiotics Rash and Other (See Comments)    "Blisters to skin with cream"   Sulfamethoxazole Rash and Other (See Comments)    "Blisters to skin with cream"    Past Medical History, Surgical history, Social history, and Family History were reviewed and updated.  Review of Systems: Review of Systems  Constitutional: Negative.   HENT:  Negative.    Eyes: Negative.   Respiratory: Negative.    Cardiovascular: Negative.   Gastrointestinal: Negative.   Endocrine: Negative.   Genitourinary: Negative.    Musculoskeletal: Negative.   Skin: Negative.   Neurological: Negative.   Hematological: Negative.   Psychiatric/Behavioral: Negative.     Physical Exam:  weight is 193 lb (87.5 kg). His oral temperature is 98.2 F (36.8 C). His blood pressure is 139/91 (abnormal) and his pulse is 88. His  respiration is 17 and oxygen saturation is 100%.   Wt Readings from Last 3 Encounters:  06/30/21 193 lb (87.5 kg)  06/17/21 192 lb 6.4 oz (87.3 kg)  05/23/21 199 lb (90.3 kg)    Physical Exam Vitals reviewed.  HENT:     Head: Normocephalic and atraumatic.  Eyes:     Pupils: Pupils are equal, round, and reactive to light.  Cardiovascular:     Rate and Rhythm: Normal rate and regular rhythm.     Heart sounds: Normal heart sounds.  Pulmonary:     Effort: Pulmonary effort is normal.     Breath sounds: Normal breath sounds.  Abdominal:     General: Bowel sounds are normal.     Palpations: Abdomen is soft.     Comments: His abdomen is soft.  He has good bowel sounds.  Has a well-healed laparotomy scar.  This is in the mid  abdomen.  There is a second laparotomy scar in the right lower quadrant.  There is no fluid wave.  There is no guarding or rebound tenderness.  There is no palpable liver or spleen tip.  Musculoskeletal:        General: No tenderness or deformity. Normal range of motion.     Cervical back: Normal range of motion.  Lymphadenopathy:     Cervical: No cervical adenopathy.  Skin:    General: Skin is warm and dry.     Findings: No erythema or rash.  Neurological:     Mental Status: He is alert and oriented to person, place, and time.  Psychiatric:        Behavior: Behavior normal.        Thought Content: Thought content normal.        Judgment: Judgment normal.     Lab Results  Component Value Date   WBC 6.9 06/30/2021   HGB 12.0 (L) 06/30/2021   HCT 36.6 (L) 06/30/2021   MCV 74.8 (L) 06/30/2021   PLT 226 06/30/2021     Chemistry      Component Value Date/Time   NA 138 06/30/2021 1308   K 3.7 06/30/2021 1308   CL 103 06/30/2021 1308   CO2 26 06/30/2021 1308   BUN 12 06/30/2021 1308   CREATININE 1.02 06/30/2021 1308      Component Value Date/Time   CALCIUM 9.6 06/30/2021 1308   ALKPHOS 110 06/30/2021 1308   AST 15 06/30/2021 1308   ALT 14 06/30/2021 1308   BILITOT 0.5 06/30/2021 1308      Impression and Plan: Dustin Golden is a very nice 65 year old African-American male.  He has a recent history of stage II colon cancer.  This should be good risk.  He had 28 negative lymph nodes.  We do have to follow him.  He does need to have a CT scan done for surveillance.  I will see about getting this set up for him after the holidays.   I think this would be a reasonable.  I would like to do the CT scan the same day that I see him.  I do not see any problems with him in the stroke that he had.  I would not believe that the stroke occurred certainly been related to the cancer.  However, he does have other risk factors for cerebrovascular disease.  We will plan to get him back around  the after the holidays.     [name], MD 9/21/20224:49 PM

## 2021-07-07 ENCOUNTER — Other Ambulatory Visit: Payer: Self-pay

## 2021-07-07 ENCOUNTER — Ambulatory Visit (HOSPITAL_COMMUNITY)
Admission: RE | Admit: 2021-07-07 | Discharge: 2021-07-07 | Disposition: A | Discharge status: Home / Self Care | Payer: Commercial Managed Care - PPO | Source: Ambulatory Visit | Attending: Neuroradiology | Admitting: Neuroradiology

## 2021-07-07 DIAGNOSIS — I6300 Cerebral infarction due to thrombosis of unspecified precerebral artery: Secondary | ICD-10-CM | POA: Diagnosis not present

## 2021-07-07 DIAGNOSIS — I6523 Occlusion and stenosis of bilateral carotid arteries: Secondary | ICD-10-CM | POA: Insufficient documentation

## 2021-07-07 NOTE — Progress Notes (Signed)
Carotid duplex has been completed.   Preliminary results in CV Proc.   Kameko Hukill Raushanah Osmundson 07/07/2021 3:12 PM

## 2021-07-07 NOTE — Progress Notes (Signed)
Oncology Nurse Navigator Documentation  Oncology Nurse Navigator Flowsheets 06/30/2021  Abnormal Finding Date -  Confirmed Diagnosis Date -  Diagnosis Status -  Phase of Treatment -  Surgery Actual Start Date: -  Navigator Follow Up Date: -  Navigator Follow Up Reason: -  Navigation Complete Date: 06/30/2021  Post Navigation: Continue to Follow Patient? No  Reason Not Navigating Patient: No Treatment, Observation Only  Navigator Location [facility]  Referral Date to RadOnc/MedOnc -  Navigator Encounter Type Appt/Treatment Plan Review  Treatment Initiated Date -  Patient Visit Type -  Treatment Phase -  Barriers/Navigation Needs No Barriers At This Time  Education -  Interventions None Required  Acuity Level 1-No Barriers  Coordination of Care -  Education Method -  Support Groups/Services -  Time Spent with Patient 15

## 2021-07-12 ENCOUNTER — Ambulatory Visit: Payer: Commercial Managed Care - PPO | Admitting: Occupational Therapy

## 2021-07-12 ENCOUNTER — Other Ambulatory Visit: Payer: Self-pay

## 2021-07-12 ENCOUNTER — Encounter: Payer: Self-pay | Admitting: Occupational Therapy

## 2021-07-12 DIAGNOSIS — G8929 Other chronic pain: Secondary | ICD-10-CM

## 2021-07-12 DIAGNOSIS — R208 Other disturbances of skin sensation: Secondary | ICD-10-CM

## 2021-07-12 DIAGNOSIS — M25511 Pain in right shoulder: Secondary | ICD-10-CM | POA: Diagnosis not present

## 2021-07-12 DIAGNOSIS — M6281 Muscle weakness (generalized): Secondary | ICD-10-CM

## 2021-07-12 DIAGNOSIS — R278 Other lack of coordination: Secondary | ICD-10-CM

## 2021-07-12 NOTE — Therapy (Signed)
[facility] [address] [place], [place], [address] Phone: [phone]   Fax:  [phone]  Occupational Therapy Treatment  Patient Details  Name: Dustin Golden MRN: [id number] Date of Birth: [birth year] Referring Provider (OT): Leeroy Cha, MD   Encounter Date: 07/12/2021   OT End of Session - 07/12/21 1616     Visit Number 2    Number of Visits 13    Date for OT Re-Evaluation 08/29/21   12 visits over 8 weeks   Authorization Type [employer] Medicare    Authorization Time Period VL: 20 Hard Max    OT Start Time 1615    OT Stop Time 1700    OT Time Calculation (min) 45 min    Activity Tolerance Patient tolerated treatment well    Behavior During Therapy [facility] for tasks assessed/performed             Past Medical History:  Diagnosis Date   Goals of care, counseling/discussion 03/02/2021   HLD (hyperlipidemia)    Hypertension    Stroke [facility])     Past Surgical History:  Procedure Laterality Date   APPENDECTOMY     BIOPSY  01/20/2021   Procedure: BIOPSY;  Surgeon: Arta Silence, MD;  Location: [facility];  Service: Endoscopy;;   COLONOSCOPY WITH PROPOFOL Left 01/20/2021   Procedure: COLONOSCOPY WITH PROPOFOL;  Surgeon: Arta Silence, MD;  Location: [facility];  Service: Endoscopy;  Laterality: Left;   ESOPHAGOGASTRODUODENOSCOPY N/A 01/19/2021   Procedure: ESOPHAGOGASTRODUODENOSCOPY (EGD);  Surgeon: Arta Silence, MD;  Location: [facility];  Service: Endoscopy;  Laterality: N/A;   gastric ulcer surgery     IR ANGIO INTRA EXTRACRAN SEL COM CAROTID INNOMINATE BILAT MOD SED  03/31/2021   IR ANGIO VERTEBRAL SEL SUBCLAVIAN INNOMINATE UNI L MOD SED  03/31/2021   IR ANGIO VERTEBRAL SEL VERTEBRAL UNI R MOD SED  03/31/2021   IR CT HEAD LTD  12/13/2020   IR INTRAVSC STENT CERV CAROTID W/EMB-PROT MOD SED INCL ANGIO  12/14/2020   IR INTRAVSC STENT CERV CAROTID W/EMB-PROT MOD SED INCL ANGIO  03/31/2021   IR PERCUTANEOUS ART  THROMBECTOMY/INFUSION INTRACRANIAL INC DIAG ANGIO  12/13/2020   IR US GUIDE VASC ACCESS RIGHT  12/13/2020   IR US GUIDE VASC ACCESS RIGHT  03/31/2021   LAPAROSCOPIC RIGHT HEMI COLECTOMY N/A 01/25/2021   Procedure: LAPAROSCOPIC RIGHT COLECTOMY;  Surgeon: Ralene Ok, MD;  Location: [facility];  Service: General;  Laterality: N/A;   RADIOLOGY WITH ANESTHESIA N/A 12/13/2020   Procedure: IR WITH ANESTHESIA;  Surgeon: Luanne Bras, MD;  Location: [facility];  Service: Radiology;  Laterality: N/A;   RADIOLOGY WITH ANESTHESIA N/A 03/31/2021   Procedure: STENTING;  Surgeon: [name], MD;  Location: [place];  Service: Radiology;  Laterality: N/A;    There were no vitals filed for this visit.   Subjective Assessment - 07/12/21 1615     Subjective  Pt reports nerve pain in the RUE with movement of arm. 5/10    Pertinent History HTN, Polysubstance Abuse, Neuropathic Pain, L MCA CVA    Patient Stated Goals "get my arm (RUE) up like that (LUE)"    Currently in Pain? Yes    Pain Score 5     Pain Location Shoulder    Pain Orientation Right    Pain Descriptors / Indicators Sore;Dull;Aching    Pain Type Neuropathic pain;Chronic pain    Pain Onset More than a month ago    Pain Frequency Constant  Pain Relieving Factors takes muscle relaxers but did not take today              Supine HEP - cane Access Code: [email or web address] URL: [email or web address] Date: 07/12/2021 Prepared by: Waldo Laine  Exercises Supine Shoulder Flexion Extension AAROM with Dowel - 1 x daily - 7 x weekly - 3 sets - 10 reps Supine Shoulder External Rotation with Dowel - 1 x daily - 7 x weekly - 3 sets - 10 reps Supine Shoulder Press AAROM in Abduction with Dowel - 1 x daily - 7 x weekly - 3 sets - 10 reps Supine Shoulder Abduction AAROM with Dowel - 1 x daily - 7 x weekly - 3 sets - 10 reps     Forward Reaching (physioball) x 10 reps   Medium Pegboard max difficulty with  coordination with pegs with RUE and with max drops. Copied pattern with no difficulty.              OT Education - 07/12/21 1626     Education Details Supine Dowel Exercises HEP Access Code: [email or web address]; [employer]    Person(s) Educated Patient    Methods Explanation;Handout    Comprehension Verbalized understanding;Returned demonstration              OT Short Term Goals - 07/04/21 1633       OT SHORT TERM GOAL #1   Title Pt will be independent with HEP for RUE shoulder ROM, coordination and grip strength.    Time 4    Period Weeks    Status New    Target Date 08/01/21      OT SHORT TERM GOAL #2   Title Pt will report decreased pain in RUE shoulder while completing bathing or driving of 1/61 or less.    Baseline 8/10 pain in RUE shoulder during these tasks    Time 4    Period Weeks    Status New      OT SHORT TERM GOAL #3   Title Pt will increase range of motion of shoulder flexion in RUE to 100* or better with pain no greater than 7/10 for increasing ability to reach in cabinets.    Baseline 85* RUE shoulder flexion, pain 8/10    Time 4    Period Weeks    Status New      OT SHORT TERM GOAL #4   Title Pt wil verbalize understanding of adapted strategies for increasing safety and independence with ADLs and IADLs (chopping vegetables, peeling potatoes, etc)    Time 4    Period Weeks    Status New      OT SHORT TERM GOAL #5   Title Pt will verbalize understanding of sensory strategies for RUE.    Time 4    Period Weeks    Status New               OT Long Term Goals - 07/12/21 1639       OT LONG TERM GOAL #1   Title Pt will be independent with any updated HEPs    Time 8    Period Weeks    Status New      OT LONG TERM GOAL #2   Title Pt will increase range of motion in RUE shoulder flexion to 110* for increasing ability to obtain lightweight object from mid level shelf with decreased pain.    Baseline 85* RUE shoulder flex    Time 8     Period  Weeks    Status New      OT LONG TERM GOAL #3   Title Pt will increase abduction range of motion in RUE to 90* or greater for increasing ability to wash under arm and decrease pain in RUE.    Baseline 70*    Time 8    Period Weeks    Status New      OT LONG TERM GOAL #4   Title Pt will increase grip strength in RUE to 35 lbs or greater for increasing dominant hand strength.    Baseline R 28.8 lbs    Time 8    Period Weeks    Status New      OT LONG TERM GOAL #5   Title Pt will complete 9 hole peg test with RUE in 2 minutes or less    Baseline 5 pegs in 2 minutes with RUE    Time 8    Period Weeks    Status New                   Plan - 07/12/21 1618     Clinical Impression Statement Pt verbalized understanding and agreement for STGs and LTGs.    OT Occupational Profile and History Problem Focused Assessment - Including review of records relating to presenting problem    Occupational performance deficits (Please refer to evaluation for details): IADL's;ADL's;Work;Leisure    Body Structure / Function / Physical Skills IADL;ROM;ADL;Decreased knowledge of use of DME;Strength;Pain;GMC;Sensation;UE functional use;Flexibility;Mobility;FMC    Cognitive Skills Attention;Safety Awareness    Rehab Potential Good    Clinical Decision Making Limited treatment options, no task modification necessary    Comorbidities Affecting Occupational Performance: None    Modification or Assistance to Complete Evaluation  No modification of tasks or assist necessary to complete eval    OT Frequency 2x / week    OT Duration 8 weeks   12 sessions in 8 weeks d/t any scheduling conflicts   OT Treatment/Interventions Self-care/ADL training;Electrical Stimulation;Ultrasound;Moist Heat;Aquatic Therapy;Fluidtherapy;DME and/or AE instruction;Therapeutic activities;Therapeutic exercise;Neuromuscular education;Manual Therapy;Patient/family education;Passive range of motion;Functional Mobility  Training;Cognitive remediation/compensation    Plan Needs HEP for pain free movement of RUE, adapted strategies for safety in kitchen, coordination HEP    OT Home Exercise Plan issued brown/tan foam grip for handwriting    Consulted and Agree with Plan of Care Patient             Patient will benefit from skilled therapeutic intervention in order to improve the following deficits and impairments:   Body Structure / Function / Physical Skills: IADL, ROM, ADL, Decreased knowledge of use of DME, Strength, Pain, GMC, Sensation, UE functional use, Flexibility, Mobility, [facility] Cognitive Skills: Attention, Safety Awareness     Visit Diagnosis: Chronic right shoulder pain  Other lack of coordination  Muscle weakness (generalized)  Other disturbances of skin sensation    Problem List Patient Active Problem List   Diagnosis Date Noted   Stenosis of both internal carotid arteries 04/01/2021   Carotid arterial disease ([place]) 03/31/2021   Goals of care, counseling/discussion 03/02/2021   Adenocarcinoma of colon ([place]) 01/28/2021   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 01/22/2021   Acute GI bleeding 01/19/2021   GIB (gastrointestinal bleeding) 01/17/2021   Hyperlipidemia 01/17/2021   History of stroke 01/17/2021   Hyponatremia    AKI (acute kidney injury) [facility])    Eye discharge    Essential hypertension    Left middle cerebral artery  stroke ([place]) 12/17/2020   Stroke ([place]) 12/13/2020   Polysubstance (excluding opioids) dependence ([place])     Zachery Conch, OT/L 07/12/2021, 4:55 PM  [facility] [address] [place], [place], [address] Phone: [phone]   Fax:  [phone]  Name: Dustin Golden MRN: [id number] Date of Birth: 09-14-1956

## 2021-07-12 NOTE — Patient Instructions (Signed)
Access Code: [email or web address] URL: [email or web address] Date: 07/12/2021 Prepared by: Waldo Laine  Exercises Supine Shoulder Flexion Extension AAROM with Dowel - 1 x daily - 7 x weekly - 3 sets - 10 reps Supine Shoulder External Rotation with Dowel - 1 x daily - 7 x weekly - 3 sets - 10 reps Supine Shoulder Press AAROM in Abduction with Dowel - 1 x daily - 7 x weekly - 3 sets - 10 reps Supine Shoulder Abduction AAROM with Dowel - 1 x daily - 7 x weekly - 3 sets - 10 reps

## 2021-07-14 ENCOUNTER — Ambulatory Visit: Payer: Commercial Managed Care - PPO | Admitting: Occupational Therapy

## 2021-07-14 ENCOUNTER — Encounter: Payer: Self-pay | Admitting: Occupational Therapy

## 2021-07-14 ENCOUNTER — Other Ambulatory Visit: Payer: Self-pay

## 2021-07-14 DIAGNOSIS — M25511 Pain in right shoulder: Secondary | ICD-10-CM | POA: Diagnosis not present

## 2021-07-14 DIAGNOSIS — R208 Other disturbances of skin sensation: Secondary | ICD-10-CM

## 2021-07-14 DIAGNOSIS — R278 Other lack of coordination: Secondary | ICD-10-CM

## 2021-07-14 DIAGNOSIS — M6281 Muscle weakness (generalized): Secondary | ICD-10-CM

## 2021-07-14 DIAGNOSIS — G8929 Other chronic pain: Secondary | ICD-10-CM

## 2021-07-14 NOTE — Therapy (Signed)
[facility] [address] [place], [place], [address] Phone: [phone]   Fax:  [phone]  Occupational Therapy Treatment  Patient Details  Name: Dustin Golden MRN: [id number] Date of Birth: 08-Feb-1956 Referring Provider (OT): Leeroy Cha, MD   Encounter Date: 07/14/2021   OT End of Session - 07/14/21 1608     Visit Number 3    Number of Visits 13    Date for OT Re-Evaluation 08/29/21   12 visits over 8 weeks   Authorization Type [employer] Medicare    Authorization Time Period VL: 20 Hard Max    OT Start Time 1606    OT Stop Time 1648    OT Time Calculation (min) 42 min    Activity Tolerance Patient tolerated treatment well    Behavior During Therapy [facility] for tasks assessed/performed             Past Medical History:  Diagnosis Date   Goals of care, counseling/discussion 03/02/2021   HLD (hyperlipidemia)    Hypertension    Stroke [facility])     Past Surgical History:  Procedure Laterality Date   APPENDECTOMY     BIOPSY  01/20/2021   Procedure: BIOPSY;  Surgeon: Arta Silence, MD;  [facility]: [facility];  Service: Endoscopy;;   COLONOSCOPY WITH PROPOFOL Left 01/20/2021   Procedure: COLONOSCOPY WITH PROPOFOL;  Surgeon: Arta Silence, MD;  [facility];  Service: Endoscopy;  Laterality: Left;   ESOPHAGOGASTRODUODENOSCOPY N/A 01/19/2021   Procedure: ESOPHAGOGASTRODUODENOSCOPY (EGD);  Surgeon: Arta Silence, MD;  Location: [facility];  Service: Endoscopy;  Laterality: N/A;   gastric ulcer surgery     IR ANGIO INTRA EXTRACRAN SEL COM CAROTID INNOMINATE BILAT MOD SED  03/31/2021   IR ANGIO VERTEBRAL SEL SUBCLAVIAN INNOMINATE UNI L MOD SED  03/31/2021   IR ANGIO VERTEBRAL SEL VERTEBRAL UNI R MOD SED  03/31/2021   IR CT HEAD LTD  12/13/2020   IR INTRAVSC STENT CERV CAROTID W/EMB-PROT MOD SED INCL ANGIO  12/14/2020   IR INTRAVSC STENT CERV CAROTID W/EMB-PROT MOD SED INCL ANGIO  03/31/2021   IR PERCUTANEOUS ART  THROMBECTOMY/INFUSION INTRACRANIAL INC DIAG ANGIO  12/13/2020   IR US GUIDE VASC ACCESS RIGHT  12/13/2020   IR US GUIDE VASC ACCESS RIGHT  03/31/2021   LAPAROSCOPIC RIGHT HEMI COLECTOMY N/A 01/25/2021   Procedure: LAPAROSCOPIC RIGHT COLECTOMY;  Surgeon: Ralene Ok, MD;  [facility];  Service: General;  Laterality: N/A;   RADIOLOGY WITH ANESTHESIA N/A 12/13/2020   Procedure: IR WITH ANESTHESIA;  Surgeon: Luanne Bras, MD;  Location: [facility];  Service: Radiology;  Laterality: N/A;   RADIOLOGY WITH ANESTHESIA N/A 03/31/2021   Procedure: STENTING;  Surgeon: [name], MD;  Location: [facility];  Service: Radiology;  Laterality: N/A;    There were no vitals filed for this visit.   Subjective Assessment - 07/14/21 1607     Subjective  Pt came in moving hand - says, "always keep it moving". Pt denies pain. Pt reports has not done HEP yet.    Pertinent History HTN, Polysubstance Abuse, Neuropathic Pain, L MCA CVA    Patient Stated Goals "get my arm (RUE) up like that (LUE)"    Currently in Pain? No/denies    Pain Score 0-No pain                    Coordination with connect four chips with picking up and placing into board/frame with RUE. Min drops and difficulty.  OT Education - 07/14/21 1623     Education Details Coordination HEP for RUE    Person(s) Educated Patient    Methods Explanation;Handout;Demonstration    Comprehension Verbalized understanding;Returned demonstration              OT Short Term Goals - 07/14/21 1609       OT SHORT TERM GOAL #1   Title Pt will be independent with HEP for RUE shoulder ROM, coordination and grip strength.    Time 4    Period Weeks    Status On-going    Target Date 08/01/21      OT SHORT TERM GOAL #2   Title Pt will report decreased pain in RUE shoulder while completing bathing or driving of 1/60 or less.    Baseline 8/10 pain in RUE shoulder during these tasks    Time 4     Period Weeks    Status On-going   5/10 pain reported with driving and bathing on 07/14/21     OT SHORT TERM GOAL #3   Title Pt will increase range of motion of shoulder flexion in RUE to 100* or better with pain no greater than 7/10 for increasing ability to reach in cabinets.    Baseline 85* RUE shoulder flexion, pain 8/10    Time 4    Period Weeks    Status New      OT SHORT TERM GOAL #4   Title Pt wil verbalize understanding of adapted strategies for increasing safety and independence with ADLs and IADLs (chopping vegetables, peeling potatoes, etc)    Time 4    Period Weeks    Status New      OT SHORT TERM GOAL #5   Title Pt will verbalize understanding of sensory strategies for RUE.    Time 4    Period Weeks    Status New               OT Long Term Goals - 07/12/21 1639       OT LONG TERM GOAL #1   Title Pt will be independent with any updated HEPs    Time 8    Period Weeks    Status New      OT LONG TERM GOAL #2   Title Pt will increase range of motion in RUE shoulder flexion to 110* for increasing ability to obtain lightweight object from mid level shelf with decreased pain.    Baseline 85* RUE shoulder flex    Time 8    Period Weeks    Status New      OT LONG TERM GOAL #3   Title Pt will increase abduction range of motion in RUE to 90* or greater for increasing ability to wash under arm and decrease pain in RUE.    Baseline 70*    Time 8    Period Weeks    Status New      OT LONG TERM GOAL #4   Title Pt will increase grip strength in RUE to 35 lbs or greater for increasing dominant hand strength.    Baseline R 28.8 lbs    Time 8    Period Weeks    Status New      OT LONG TERM GOAL #5   Title Pt will complete 9 hole peg test with RUE in 2 minutes or less    Baseline 5 pegs in 2 minutes with RUE    Time 8    Period Weeks  Status New                   Plan - 07/14/21 1623     Clinical Impression Statement Pt progressing towards  goals. Pt motivated for return function of RUE.    OT Occupational Profile and History Problem Focused Assessment - Including review of records relating to presenting problem    Occupational performance deficits (Please refer to evaluation for details): IADL's;ADL's;Work;Leisure    Body Structure / Function / Physical Skills IADL;ROM;ADL;Decreased knowledge of use of DME;Strength;Pain;GMC;Sensation;UE functional use;Flexibility;Mobility;FMC    Cognitive Skills Attention;Safety Awareness    Rehab Potential Good    Clinical Decision Making Limited treatment options, no task modification necessary    Comorbidities Affecting Occupational Performance: None    Modification or Assistance to Complete Evaluation  No modification of tasks or assist necessary to complete eval    OT Frequency 2x / week    OT Duration 8 weeks   12 sessions in 8 weeks d/t any scheduling conflicts   OT Treatment/Interventions Self-care/ADL training;Electrical Stimulation;Ultrasound;Moist Heat;Aquatic Therapy;Fluidtherapy;DME and/or AE instruction;Therapeutic activities;Therapeutic exercise;Neuromuscular education;Manual Therapy;Patient/family education;Passive range of motion;Functional Mobility Training;Cognitive remediation/compensation    Plan Needs HEP for pain free movement of RUE, adapted strategies for safety in kitchen    OT Home Exercise Plan issued brown/tan foam grip for handwriting    Consulted and Agree with Plan of Care Patient             Patient will benefit from skilled therapeutic intervention in order to improve the following deficits and impairments:   Body Structure / Function / Physical Skills: IADL, ROM, ADL, Decreased knowledge of use of DME, Strength, Pain, GMC, Sensation, UE functional use, Flexibility, Mobility, [facility] Cognitive Skills: Attention, Safety Awareness     Visit Diagnosis: Chronic right shoulder pain  Muscle weakness (generalized)  Other disturbances of skin sensation  Other  lack of coordination    Problem List Patient Active Problem List   Diagnosis Date Noted   Stenosis of both internal carotid arteries 04/01/2021   Carotid arterial disease ([place]) 03/31/2021   Goals of care, counseling/discussion 03/02/2021   Adenocarcinoma of colon ([place]) 01/28/2021   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)     Zachery Conch, OT/L 07/14/2021, 4:49 PM  [facility] [address] [place], [place], [address] Phone: [phone]   Fax:  [phone]  Name: Dustin Golden MRN: [id number] Date of Birth: 05/01/1956

## 2021-07-19 ENCOUNTER — Other Ambulatory Visit: Payer: Self-pay

## 2021-07-19 ENCOUNTER — Ambulatory Visit: Payer: Commercial Managed Care - PPO | Attending: Physical Medicine and Rehabilitation | Admitting: Occupational Therapy

## 2021-07-19 DIAGNOSIS — G8929 Other chronic pain: Secondary | ICD-10-CM | POA: Diagnosis present

## 2021-07-19 DIAGNOSIS — M6281 Muscle weakness (generalized): Secondary | ICD-10-CM | POA: Diagnosis present

## 2021-07-19 DIAGNOSIS — R208 Other disturbances of skin sensation: Secondary | ICD-10-CM | POA: Diagnosis present

## 2021-07-19 DIAGNOSIS — R278 Other lack of coordination: Secondary | ICD-10-CM | POA: Insufficient documentation

## 2021-07-19 DIAGNOSIS — M25511 Pain in right shoulder: Secondary | ICD-10-CM | POA: Diagnosis not present

## 2021-07-19 NOTE — Therapy (Signed)
[facility] [address] [place], [place], [address] Phone: [phone]   Fax:  [phone]  Occupational Therapy Treatment  Patient Details  Name: Dustin Golden MRN: [id number] Date of Birth: 1956-05-16 Referring Provider (OT): Leeroy Cha, MD   Encounter Date: 07/19/2021   OT End of Session - 07/19/21 1604     Visit Number 4    Number of Visits 13    Date for OT Re-Evaluation 08/29/21   12 visits over 8 weeks   Authorization Type [employer] Medicare    Authorization Time Period VL: 20 Hard Max    OT Start Time 5102    OT Stop Time 5852    OT Time Calculation (min) 41 min    Activity Tolerance Patient tolerated treatment well    Behavior During Therapy [facility] for tasks assessed/performed             Past Medical History:  Diagnosis Date   Goals of care, counseling/discussion 03/02/2021   HLD (hyperlipidemia)    Hypertension    Stroke [facility])     Past Surgical History:  Procedure Laterality Date   APPENDECTOMY     BIOPSY  01/20/2021   Procedure: BIOPSY;  Surgeon: Arta Silence, MD;  [facility];  Service: Endoscopy;;   COLONOSCOPY WITH PROPOFOL Left 01/20/2021   Procedure: COLONOSCOPY WITH PROPOFOL;  Surgeon: Arta Silence, MD;  [facility];  Service: Endoscopy;  Laterality: Left;   ESOPHAGOGASTRODUODENOSCOPY N/A 01/19/2021   Procedure: ESOPHAGOGASTRODUODENOSCOPY (EGD);  Surgeon: Arta Silence, MD;  Location: [facility];  Service: Endoscopy;  Laterality: N/A;   gastric ulcer surgery     IR ANGIO INTRA EXTRACRAN SEL COM CAROTID INNOMINATE BILAT MOD SED  03/31/2021   IR ANGIO VERTEBRAL SEL SUBCLAVIAN INNOMINATE UNI L MOD SED  03/31/2021   IR ANGIO VERTEBRAL SEL VERTEBRAL UNI R MOD SED  03/31/2021   IR CT HEAD LTD  12/13/2020   IR INTRAVSC STENT CERV CAROTID W/EMB-PROT MOD SED INCL ANGIO  12/14/2020   IR INTRAVSC STENT CERV CAROTID W/EMB-PROT MOD SED INCL ANGIO  03/31/2021   IR PERCUTANEOUS ART  THROMBECTOMY/INFUSION INTRACRANIAL INC DIAG ANGIO  12/13/2020   IR US GUIDE VASC ACCESS RIGHT  12/13/2020   IR US GUIDE VASC ACCESS RIGHT  03/31/2021   LAPAROSCOPIC RIGHT HEMI COLECTOMY N/A 01/25/2021   Procedure: LAPAROSCOPIC RIGHT COLECTOMY;  Surgeon: Ralene Ok, MD;  [facility];  Service: General;  Laterality: N/A;   RADIOLOGY WITH ANESTHESIA N/A 12/13/2020   Procedure: IR WITH ANESTHESIA;  Surgeon: Luanne Bras, MD;  Location: [facility];  Service: Radiology;  Laterality: N/A;   RADIOLOGY WITH ANESTHESIA N/A 03/31/2021   Procedure: STENTING;  Surgeon: [name], MD;  Location: [facility] [place];  Service: Radiology;  Laterality: N/A;    There were no vitals filed for this visit.   Subjective Assessment - 07/19/21 1604     Subjective  Had appts yesterday over this way with my primary doctor and gastro. went pretty good.    Pertinent History HTN, Polysubstance Abuse, Neuropathic Pain, L MCA CVA    Patient Stated Goals "get my arm (RUE) up like that (LUE)"    Currently in Pain? No/denies    Pain Score 0-No pain               UE Ranger with RUE with forward reaching/shoulder flexion and horizontal abduction to right x 12 reps  Ball on Wall  with BUE for increased range of motion in  RUE.  Hand Gripper: with RUE on level 1 with black spring. Pt picked up 1 inch blocks with gripper with mod drops and mod difficulty d/t apraxia and attention.  Stacking Blocks with RUE with mod drops.                    OT Short Term Goals - 07/19/21 1605       OT SHORT TERM GOAL #1   Title Pt will be independent with HEP for RUE shoulder ROM, coordination and grip strength.    Time 4    Period Weeks    Status On-going    Target Date 08/01/21      OT SHORT TERM GOAL #2   Title Pt will report decreased pain in RUE shoulder while completing bathing or driving of 0/78 or less.    Baseline 8/10 pain in RUE shoulder during these tasks    Time 4    Period Weeks     Status On-going   5/10 pain reported with driving and bathing on 07/14/21     OT SHORT TERM GOAL #3   Title Pt will increase range of motion of shoulder flexion in RUE to 100* or better with pain no greater than 7/10 for increasing ability to reach in cabinets.    Baseline 85* RUE shoulder flexion, pain 8/10    Time 4    Period Weeks    Status On-going      OT SHORT TERM GOAL #4   Title Pt wil verbalize understanding of adapted strategies for increasing safety and independence with ADLs and IADLs (chopping vegetables, peeling potatoes, etc)    Time 4    Period Weeks    Status On-going      OT SHORT TERM GOAL #5   Title Pt will verbalize understanding of sensory strategies for RUE.    Time 4    Period Weeks    Status New               OT Long Term Goals - 07/12/21 1639       OT LONG TERM GOAL #1   Title Pt will be independent with any updated HEPs    Time 8    Period Weeks    Status New      OT LONG TERM GOAL #2   Title Pt will increase range of motion in RUE shoulder flexion to 110* for increasing ability to obtain lightweight object from mid level shelf with decreased pain.    Baseline 85* RUE shoulder flex    Time 8    Period Weeks    Status New      OT LONG TERM GOAL #3   Title Pt will increase abduction range of motion in RUE to 90* or greater for increasing ability to wash under arm and decrease pain in RUE.    Baseline 70*    Time 8    Period Weeks    Status New      OT LONG TERM GOAL #4   Title Pt will increase grip strength in RUE to 35 lbs or greater for increasing dominant hand strength.    Baseline R 28.8 lbs    Time 8    Period Weeks    Status New      OT LONG TERM GOAL #5   Title Pt will complete 9 hole peg test with RUE in 2 minutes or less    Baseline 5 pegs in 2 minutes with  RUE    Time 8    Period Weeks    Status New                   Plan - 07/19/21 1631     Clinical Impression Statement Pt improving with RUE.    OT  Occupational Profile and History Problem Focused Assessment - Including review of records relating to presenting problem    Occupational performance deficits (Please refer to evaluation for details): IADL's;ADL's;Work;Leisure    Body Structure / Function / Physical Skills IADL;ROM;ADL;Decreased knowledge of use of DME;Strength;Pain;GMC;Sensation;UE functional use;Flexibility;Mobility;FMC    Cognitive Skills Attention;Safety Awareness    Rehab Potential Good    Clinical Decision Making Limited treatment options, no task modification necessary    Comorbidities Affecting Occupational Performance: None    Modification or Assistance to Complete Evaluation  No modification of tasks or assist necessary to complete eval    OT Frequency 2x / week    OT Duration 8 weeks   12 sessions in 8 weeks d/t any scheduling conflicts   OT Treatment/Interventions Self-care/ADL training;Electrical Stimulation;Ultrasound;Moist Heat;Aquatic Therapy;Fluidtherapy;DME and/or AE instruction;Therapeutic activities;Therapeutic exercise;Neuromuscular education;Manual Therapy;Patient/family education;Passive range of motion;Functional Mobility Training;Cognitive remediation/compensation    Plan continue RUE shoulder ROM, Grip strength, coordination    OT Home Exercise Plan issued brown/tan foam grip for handwriting    Consulted and Agree with Plan of Care Patient             Patient will benefit from skilled therapeutic intervention in order to improve the following deficits and impairments:   Body Structure / Function / Physical Skills: IADL, ROM, ADL, Decreased knowledge of use of DME, Strength, Pain, GMC, Sensation, UE functional use, Flexibility, Mobility, [facility] Cognitive Skills: Attention, Safety Awareness     Visit Diagnosis: Chronic right shoulder pain  Muscle weakness (generalized)  Other disturbances of skin sensation  Other lack of coordination    Problem List Patient Active Problem List   Diagnosis  Date Noted   Stenosis of both internal carotid arteries 04/01/2021   Carotid arterial disease ([place]) 03/31/2021   Goals of care, counseling/discussion 03/02/2021   Adenocarcinoma of colon ([place]) 01/28/2021   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 [place], [place], [address] Phone: [phone]   Fax:  [phone]  Name: ILEY BREEDEN MRN: [id number] Date of Birth: 24-Jan-1956

## 2021-07-21 ENCOUNTER — Other Ambulatory Visit: Payer: Self-pay

## 2021-07-21 ENCOUNTER — Ambulatory Visit: Payer: Commercial Managed Care - PPO | Admitting: Occupational Therapy

## 2021-07-21 ENCOUNTER — Encounter: Payer: Self-pay | Admitting: Occupational Therapy

## 2021-07-21 DIAGNOSIS — M6281 Muscle weakness (generalized): Secondary | ICD-10-CM

## 2021-07-21 DIAGNOSIS — M25511 Pain in right shoulder: Secondary | ICD-10-CM | POA: Diagnosis not present

## 2021-07-21 DIAGNOSIS — R208 Other disturbances of skin sensation: Secondary | ICD-10-CM

## 2021-07-21 DIAGNOSIS — G8929 Other chronic pain: Secondary | ICD-10-CM

## 2021-07-21 DIAGNOSIS — R278 Other lack of coordination: Secondary | ICD-10-CM

## 2021-07-21 NOTE — Therapy (Signed)
[facility] [address] [place], [place], [address] Phone: [phone]   Fax:  [phone]  Occupational Therapy Treatment  Patient Details  Name: Dustin Golden MRN: [id number] Date of Birth: March 23, 1956 Referring Provider (OT): Leeroy Cha, MD   Encounter Date: 07/21/2021   OT End of Session - 07/21/21 1833     Visit Number 5    Number of Visits 13    Date for OT Re-Evaluation 08/29/21    Authorization Type U[employer] Medicare    Authorization Time Period VL: 20 Hard Max    OT Start Time 9798    OT Stop Time 1615    OT Time Calculation (min) 45 min    Activity Tolerance Patient tolerated treatment well    Behavior During Therapy [facility] for tasks assessed/performed             Past Medical History:  Diagnosis Date   Goals of care, counseling/discussion 03/02/2021   HLD (hyperlipidemia)    Hypertension    Stroke [facility])     Past Surgical History:  Procedure Laterality Date   APPENDECTOMY     BIOPSY  01/20/2021   Procedure: BIOPSY;  Surgeon: Arta Silence, MD;  Location: [facility];  Service: Endoscopy;;   COLONOSCOPY WITH PROPOFOL Left 01/20/2021   Procedure: COLONOSCOPY WITH PROPOFOL;  Surgeon: Arta Silence, MD;  [facility];  Service: Endoscopy;  Laterality: Left;   ESOPHAGOGASTRODUODENOSCOPY N/A 01/19/2021   Procedure: ESOPHAGOGASTRODUODENOSCOPY (EGD);  Surgeon: Arta Silence, MD;  Location: [facility];  Service: Endoscopy;  Laterality: N/A;   gastric ulcer surgery     IR ANGIO INTRA EXTRACRAN SEL COM CAROTID INNOMINATE BILAT MOD SED  03/31/2021   IR ANGIO VERTEBRAL SEL SUBCLAVIAN INNOMINATE UNI L MOD SED  03/31/2021   IR ANGIO VERTEBRAL SEL VERTEBRAL UNI R MOD SED  03/31/2021   IR CT HEAD LTD  12/13/2020   IR INTRAVSC STENT CERV CAROTID W/EMB-PROT MOD SED INCL ANGIO  12/14/2020   IR INTRAVSC STENT CERV CAROTID W/EMB-PROT MOD SED INCL ANGIO  03/31/2021   IR PERCUTANEOUS ART THROMBECTOMY/INFUSION  INTRACRANIAL INC DIAG ANGIO  12/13/2020   IR US GUIDE VASC ACCESS RIGHT  12/13/2020   IR US GUIDE VASC ACCESS RIGHT  03/31/2021   LAPAROSCOPIC RIGHT HEMI COLECTOMY N/A 01/25/2021   Procedure: LAPAROSCOPIC RIGHT COLECTOMY;  Surgeon: Ralene Ok, MD;  [facility];  Service: General;  Laterality: N/A;   RADIOLOGY WITH ANESTHESIA N/A 12/13/2020   Procedure: IR WITH ANESTHESIA;  Surgeon: Luanne Bras, MD;  Location: [facility];  Service: Radiology;  Laterality: N/A;   RADIOLOGY WITH ANESTHESIA N/A 03/31/2021   Procedure: STENTING;  Surgeon: [name], MD;  Location: [facility];  Service: Radiology;  Laterality: N/A;    There were no vitals filed for this visit.   Subjective Assessment - 07/21/21 1614     Subjective  My arm got really stiff after the hospital (for colon cancer)    Pertinent History HTN, Polysubstance Abuse, Neuropathic Pain, L MCA CVA    Patient Stated Goals "get my arm (RUE) up like that (LUE)"    Currently in Pain? Yes    Pain Score 5     Pain Location Shoulder    Pain Orientation Right    Pain Descriptors / Indicators Aching;Tingling    Pain Type Neuropathic pain;Chronic pain    Pain Onset More than a month ago    Pain Frequency Intermittent    Aggravating Factors  movement - end  range    Pain Relieving Factors rest                          OT Treatments/Exercises (OP) - 07/21/21 0001       Neurological Re-education Exercises   Other Exercises 1 Patient with limited dissociation of humerus and scapula this visit.  Used rolling to obtain body on arm motion and to increase in small increments range of motion in Right shoulder.  Patient able to roll onto right arm at 45, 60, 80 degrees of flexion/scaption without discomfort.  Patient can use this as home program to loosen arm daily.    Other Exercises 2 Modified plantgrade - facilitating alignment of GH joint, scap, and elbow then using body position to provide increased stretch to  right shoulder.                    OT Education - 07/21/21 1833     Education Details supine to sidelying to stretch RUE    Person(s) Educated Patient    Methods Explanation;Demonstration;Tactile cues;Verbal cues    Comprehension Verbalized understanding;Returned demonstration;Need further instruction              OT Short Term Goals - 07/21/21 1835       OT SHORT TERM GOAL #1   Title Pt will be independent with HEP for RUE shoulder ROM, coordination and grip strength.    Time 4    Period Weeks    Status On-going    Target Date 08/01/21      OT SHORT TERM GOAL #2   Title Pt will report decreased pain in RUE shoulder while completing bathing or driving of 7/86 or less.    Baseline 8/10 pain in RUE shoulder during these tasks    Time 4    Period Weeks    Status On-going   5/10 pain reported with driving and bathing on 07/14/21     OT SHORT TERM GOAL #3   Title Pt will increase range of motion of shoulder flexion in RUE to 100* or better with pain no greater than 7/10 for increasing ability to reach in cabinets.    Baseline 85* RUE shoulder flexion, pain 8/10    Time 4    Period Weeks    Status On-going      OT SHORT TERM GOAL #4   Title Pt wil verbalize understanding of adapted strategies for increasing safety and independence with ADLs and IADLs (chopping vegetables, peeling potatoes, etc)    Time 4    Period Weeks    Status On-going      OT SHORT TERM GOAL #5   Title Pt will verbalize understanding of sensory strategies for RUE.    Time 4    Period Weeks    Status New               OT Long Term Goals - 07/21/21 1835       OT LONG TERM GOAL #1   Title Pt will be independent with any updated HEPs    Time 8    Period Weeks    Status New      OT LONG TERM GOAL #2   Title Pt will increase range of motion in RUE shoulder flexion to 110* for increasing ability to obtain lightweight object from mid level shelf with decreased pain.    Baseline  85* RUE shoulder flex    Time 8  Period Weeks    Status New      OT LONG TERM GOAL #3   Title Pt will increase abduction range of motion in RUE to 90* or greater for increasing ability to wash under arm and decrease pain in RUE.    Baseline 70*    Time 8    Period Weeks    Status New      OT LONG TERM GOAL #4   Title Pt will increase grip strength in RUE to 35 lbs or greater for increasing dominant hand strength.    Baseline R 28.8 lbs    Time 8    Period Weeks    Status New      OT LONG TERM GOAL #5   Title Pt will complete 9 hole peg test with RUE in 2 minutes or less    Baseline 5 pegs in 2 minutes with RUE    Time 8    Period Weeks    Status New                   Plan - 07/21/21 1833     Clinical Impression Statement Pt eager for functional improvement in RUE.  Patient may benefit from more consistent schedule taking baclofen - prescribed three times daily - patient reports taking 1-2 times daily.  Patient with significant stiffness and tension in right shoulder.    OT Occupational Profile and History Problem Focused Assessment - Including review of records relating to presenting problem    Occupational performance deficits (Please refer to evaluation for details): IADL's;ADL's;Work;Leisure    Body Structure / Function / Physical Skills IADL;ROM;ADL;Decreased knowledge of use of DME;Strength;Pain;GMC;Sensation;UE functional use;Flexibility;Mobility;FMC    Cognitive Skills Attention;Safety Awareness    Rehab Potential Good    Clinical Decision Making Limited treatment options, no task modification necessary    Comorbidities Affecting Occupational Performance: None    Modification or Assistance to Complete Evaluation  No modification of tasks or assist necessary to complete eval    OT Frequency 2x / week    OT Duration 8 weeks   12 sessions in 8 weeks d/t any scheduling conflicts   OT Treatment/Interventions Self-care/ADL training;Electrical  Stimulation;Ultrasound;Moist Heat;Aquatic Therapy;Fluidtherapy;DME and/or AE instruction;Therapeutic activities;Therapeutic exercise;Neuromuscular education;Manual Therapy;Patient/family education;Passive range of motion;Functional Mobility Training;Cognitive remediation/compensation    Plan continue RUE shoulder ROM, Grip strength, coordination    OT Home Exercise Plan issued brown/tan foam grip for handwriting    Consulted and Agree with Plan of Care Patient             Patient will benefit from skilled therapeutic intervention in order to improve the following deficits and impairments:   Body Structure / Function / Physical Skills: IADL, ROM, ADL, Decreased knowledge of use of DME, Strength, Pain, GMC, Sensation, UE functional use, Flexibility, Mobility, [facility] Cognitive Skills: Attention, Safety Awareness     Visit Diagnosis: Chronic right shoulder pain  Muscle weakness (generalized)  Other disturbances of skin sensation  Other lack of coordination    Problem List Patient Active Problem List   Diagnosis Date Noted   Stenosis of both internal carotid arteries 04/01/2021   Carotid arterial disease ([place]) 03/31/2021   Goals of care, counseling/discussion 03/02/2021   Adenocarcinoma of colon ([place]) 01/28/2021   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 [place], [place], [address] Phone: [phone]   Fax:  [phone]  Name: TRINITY HYLAND MRN: [id number] Date of Birth: 24-Nov-1955

## 2021-07-22 ENCOUNTER — Telehealth: Payer: Self-pay | Admitting: Student

## 2021-07-22 NOTE — Telephone Encounter (Signed)
Dr. Karenann Cai would like to see this patient for a follow up visit to discuss recent imaging studies and to discuss future treatment plans. A message was left with the patient to notify him of this and that a scheduler from our office will call him to arrange a date/time.   Soyla Dryer, [place] [phone] 07/22/2021, 2:30 PM

## 2021-07-26 ENCOUNTER — Other Ambulatory Visit: Payer: Self-pay

## 2021-07-26 ENCOUNTER — Telehealth (HOSPITAL_COMMUNITY): Payer: Self-pay

## 2021-07-26 ENCOUNTER — Ambulatory Visit: Payer: Commercial Managed Care - PPO | Admitting: Occupational Therapy

## 2021-07-26 ENCOUNTER — Other Ambulatory Visit (HOSPITAL_COMMUNITY): Payer: Self-pay | Admitting: Physician Assistant

## 2021-07-26 ENCOUNTER — Encounter: Payer: Self-pay | Admitting: Occupational Therapy

## 2021-07-26 DIAGNOSIS — R278 Other lack of coordination: Secondary | ICD-10-CM

## 2021-07-26 DIAGNOSIS — R208 Other disturbances of skin sensation: Secondary | ICD-10-CM

## 2021-07-26 DIAGNOSIS — M6281 Muscle weakness (generalized): Secondary | ICD-10-CM

## 2021-07-26 DIAGNOSIS — M25511 Pain in right shoulder: Secondary | ICD-10-CM | POA: Diagnosis not present

## 2021-07-26 DIAGNOSIS — G8929 Other chronic pain: Secondary | ICD-10-CM

## 2021-07-26 NOTE — Therapy (Signed)
[facility] [address] [place], [place], [address] Phone: [phone]   Fax:  [phone]  Occupational Therapy Treatment  Patient Details  Name: Dustin Golden MRN: [id number] Date of Birth: April 25, 1956 Referring Provider (OT): Leeroy Cha, MD   Encounter Date: 07/26/2021   OT End of Session - 07/26/21 1738     Visit Number 6    Number of Visits 13    Date for OT Re-Evaluation 08/29/21    Authorization Type [employer]HC Medicare    Authorization Time Period VL: 20 Hard Max    OT Start Time 1700    OT Stop Time 1740    OT Time Calculation (min) 40 min    Activity Tolerance Patient tolerated treatment well    Behavior During Therapy [facility] for tasks assessed/performed             Past Medical History:  Diagnosis Date   Goals of care, counseling/discussion 03/02/2021   HLD (hyperlipidemia)    Hypertension    Stroke [facility])     Past Surgical History:  Procedure Laterality Date   APPENDECTOMY     BIOPSY  01/20/2021   Procedure: BIOPSY;  Surgeon: Arta Silence, MD;  [facility];  Service: Endoscopy;;   COLONOSCOPY WITH PROPOFOL Left 01/20/2021   Procedure: COLONOSCOPY WITH PROPOFOL;  Surgeon: Arta Silence, MD;  [facility];  Service: Endoscopy;  Laterality: Left;   ESOPHAGOGASTRODUODENOSCOPY N/A 01/19/2021   Procedure: ESOPHAGOGASTRODUODENOSCOPY (EGD);  Surgeon: Arta Silence, MD;  Location[facility];  Service: Endoscopy;  Laterality: N/A;   gastric ulcer surgery     IR ANGIO INTRA EXTRACRAN SEL COM CAROTID INNOMINATE BILAT MOD SED  03/31/2021   IR ANGIO VERTEBRAL SEL SUBCLAVIAN INNOMINATE UNI L MOD SED  03/31/2021   IR ANGIO VERTEBRAL SEL VERTEBRAL UNI R MOD SED  03/31/2021   IR CT HEAD LTD  12/13/2020   IR INTRAVSC STENT CERV CAROTID W/EMB-PROT MOD SED INCL ANGIO  12/14/2020   IR INTRAVSC STENT CERV CAROTID W/EMB-PROT MOD SED INCL ANGIO  03/31/2021   IR PERCUTANEOUS ART THROMBECTOMY/INFUSION  INTRACRANIAL INC DIAG ANGIO  12/13/2020   IR US GUIDE VASC ACCESS RIGHT  12/13/2020   IR US GUIDE VASC ACCESS RIGHT  03/31/2021   LAPAROSCOPIC RIGHT HEMI COLECTOMY N/A 01/25/2021   Procedure: LAPAROSCOPIC RIGHT COLECTOMY;  Surgeon: Ralene Ok, MD;  [facility];  Service: General;  Laterality: N/A;   RADIOLOGY WITH ANESTHESIA N/A 12/13/2020   Procedure: IR WITH ANESTHESIA;  Surgeon: Luanne Bras, MD;  Location: [facility];  Service: Radiology;  Laterality: N/A;   RADIOLOGY WITH ANESTHESIA N/A 03/31/2021   Procedure: STENTING;  Surgeon: [name], MD;  Location: [facility];  Service: Radiology;  Laterality: N/A;    There were no vitals filed for this visit.   Subjective Assessment - 07/26/21 1703     Subjective  "Taking it slow and easy". Pt reports exercises are a bit complicated sometimes.    Pertinent History HTN, Polysubstance Abuse, Neuropathic Pain, L MCA CVA    Patient Stated Goals "get my arm (RUE) up like that (LUE)"    Currently in Pain? No/denies    Pain Score 0-No pain    Pain Onset --              Hand Gripper: with RUE on level 1 with black spring. Pt picked up 1 inch blocks with gripper with mod drops and mod difficulty d/t apraxia and attention.  Semi Circle Pegboard  with RUE. Pt able to manipulate all pegs with max difficulty with smallest pegs.                    OT Education - 07/26/21 1727     Education Details sensory strategies    Person(s) Educated Patient    Methods Explanation;Handout    Comprehension Verbalized understanding;Need further instruction              OT Short Term Goals - 07/26/21 1707       OT SHORT TERM GOAL #1   Title Pt will be independent with HEP for RUE shoulder ROM, coordination and grip strength.    Time 4    Period Weeks    Status On-going    Target Date 08/01/21      OT SHORT TERM GOAL #2   Title Pt will report decreased pain in RUE shoulder while completing bathing or driving  of 2/44 or less.    Baseline 8/10 pain in RUE shoulder during these tasks    Time 4    Period Weeks    Status Achieved   5/10 pain reported with driving and bathing on 07/14/21, 07/26/21     OT SHORT TERM GOAL #3   Title Pt will increase range of motion of shoulder flexion in RUE to 100* or better with pain no greater than 7/10 for increasing ability to reach in cabinets.    Baseline 85* RUE shoulder flexion, pain 8/10    Time 4    Period Weeks    Status On-going      OT SHORT TERM GOAL #4   Title Pt wil verbalize understanding of adapted strategies for increasing safety and independence with ADLs and IADLs (chopping vegetables, peeling potatoes, etc)    Time 4    Period Weeks    Status On-going      OT SHORT TERM GOAL #5   Title Pt will verbalize understanding of sensory strategies for RUE.    Time 4    Period Weeks    Status On-going               OT Long Term Goals - 07/21/21 1835       OT LONG TERM GOAL #1   Title Pt will be independent with any updated HEPs    Time 8    Period Weeks    Status New      OT LONG TERM GOAL #2   Title Pt will increase range of motion in RUE shoulder flexion to 110* for increasing ability to obtain lightweight object from mid level shelf with decreased pain.    Baseline 85* RUE shoulder flex    Time 8    Period Weeks    Status New      OT LONG TERM GOAL #3   Title Pt will increase abduction range of motion in RUE to 90* or greater for increasing ability to wash under arm and decrease pain in RUE.    Baseline 70*    Time 8    Period Weeks    Status New      OT LONG TERM GOAL #4   Title Pt will increase grip strength in RUE to 35 lbs or greater for increasing dominant hand strength.    Baseline R 28.8 lbs    Time 8    Period Weeks    Status New      OT LONG TERM GOAL #5   Title Pt will  complete 9 hole peg test with RUE in 2 minutes or less    Baseline 5 pegs in 2 minutes with RUE    Time 8    Period Weeks    Status New                    Plan - 07/26/21 1745     Clinical Impression Statement Pt progressing twoards goals. Continues to demonstrate difficulty with RUE with grip strength, motor planning and attention to R.    OT Occupational Profile and History Problem Focused Assessment - Including review of records relating to presenting problem    Occupational performance deficits (Please refer to evaluation for details): IADL's;ADL's;Work;Leisure    Body Structure / Function / Physical Skills IADL;ROM;ADL;Decreased knowledge of use of DME;Strength;Pain;GMC;Sensation;UE functional use;Flexibility;Mobility;FMC    Cognitive Skills Attention;Safety Awareness    Rehab Potential Good    Clinical Decision Making Limited treatment options, no task modification necessary    Comorbidities Affecting Occupational Performance: None    Modification or Assistance to Complete Evaluation  No modification of tasks or assist necessary to complete eval    OT Frequency 2x / week    OT Duration 8 weeks   12 sessions in 8 weeks d/t any scheduling conflicts   OT Treatment/Interventions Self-care/ADL training;Electrical Stimulation;Ultrasound;Moist Heat;Aquatic Therapy;Fluidtherapy;DME and/or AE instruction;Therapeutic activities;Therapeutic exercise;Neuromuscular education;Manual Therapy;Patient/family education;Passive range of motion;Functional Mobility Training;Cognitive remediation/compensation    Plan continue RUE shoulder ROM, Grip strength, coordination    OT Home Exercise Plan issued brown/tan foam grip for handwriting    Consulted and Agree with Plan of Care Patient             Patient will benefit from skilled therapeutic intervention in order to improve the following deficits and impairments:   Body Structure / Function / Physical Skills: IADL, ROM, ADL, Decreased knowledge of use of DME, Strength, Pain, GMC, Sensation, UE functional use, Flexibility, Mobility, [facility] Cognitive Skills: Attention, Safety  Awareness     Visit Diagnosis: Chronic right shoulder pain  Muscle weakness (generalized)  Other disturbances of skin sensation  Other lack of coordination    Problem List Patient Active Problem List   Diagnosis Date Noted   Stenosis of both internal carotid arteries 04/01/2021   Carotid arterial disease ([place]) 03/31/2021   Goals of care, counseling/discussion 03/02/2021   Adenocarcinoma of colon ([place]) 01/28/2021   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

## 2021-07-26 NOTE — Telephone Encounter (Signed)
Called to schedule clinic visit, no answer, left vm. AW

## 2021-07-27 ENCOUNTER — Ambulatory Visit (HOSPITAL_COMMUNITY)
Admission: RE | Admit: 2021-07-27 | Discharge: 2021-07-27 | Disposition: A | Discharge status: Home / Self Care | Payer: Commercial Managed Care - PPO | Source: Ambulatory Visit | Attending: Physician Assistant | Admitting: Physician Assistant

## 2021-07-27 NOTE — Progress Notes (Signed)
Referring Physician(s): Watterson,Shannon A  Chief Complaint: The patient is seen in follow up today s/p Right ICA stenting.  History of present illness:  Dustin Golden is a 65 y.o. 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  He was treated with mechanical thrombectomy and left carotid stenting.  He was started on dual antiplatelet therapy with aspirin and Brilinta for his left carotid stent but subsequently presented to ED with BRBPR leading to Stage II colon cancer diagnosis.  After successful resection, he was able to resume DAPT. He was admitted on 03/31/21 for elective cerebral angiogram with angioplasty and stent placement within the right carotid artery due to 80-90% stenosis at the bifurcation. The procedure was performed under monitored anesthesia care (MAC) without complication. He comes today for follow-up.   Past Medical History:  Diagnosis Date   Goals of care, counseling/discussion 03/02/2021   HLD (hyperlipidemia)    Hypertension    Stroke [facility])     Past Surgical History:  Procedure Laterality Date   APPENDECTOMY     BIOPSY  01/20/2021   Procedure: BIOPSY;  Surgeon: Arta Silence, MD;  Location: [facility];  Service: Endoscopy;;   COLONOSCOPY WITH PROPOFOL Left 01/20/2021   Procedure: COLONOSCOPY WITH PROPOFOL;  Surgeon: Arta Silence, MD;  [facility];  Service: Endoscopy;  Laterality: Left;   ESOPHAGOGASTRODUODENOSCOPY N/A 01/19/2021   Procedure: ESOPHAGOGASTRODUODENOSCOPY (EGD);  Surgeon: Arta Silence, MD;  Location: [facility];  Service: Endoscopy;  Laterality: N/A;   gastric ulcer surgery     IR ANGIO INTRA EXTRACRAN SEL COM CAROTID INNOMINATE BILAT MOD SED  03/31/2021   IR ANGIO VERTEBRAL SEL SUBCLAVIAN INNOMINATE UNI L MOD SED  03/31/2021   IR ANGIO VERTEBRAL  SEL VERTEBRAL UNI R MOD SED  03/31/2021   IR CT HEAD LTD  12/13/2020   IR INTRAVSC STENT CERV CAROTID W/EMB-PROT MOD SED INCL ANGIO  12/14/2020   IR INTRAVSC STENT CERV CAROTID W/EMB-PROT MOD SED INCL ANGIO  03/31/2021   IR PERCUTANEOUS ART THROMBECTOMY/INFUSION INTRACRANIAL INC DIAG ANGIO  12/13/2020   IR US GUIDE VASC ACCESS RIGHT  12/13/2020   IR US GUIDE VASC ACCESS RIGHT  03/31/2021   LAPAROSCOPIC RIGHT HEMI COLECTOMY N/A 01/25/2021   Procedure: LAPAROSCOPIC RIGHT COLECTOMY;  Surgeon: Ralene Ok, MD;  Location: [facility];  Service: General;  Laterality: N/A;   RADIOLOGY WITH ANESTHESIA N/A 12/13/2020   Procedure: IR WITH ANESTHESIA;  Surgeon:  [name], MD;  Location: [place];  Service: Radiology;  Laterality: N/A;   RADIOLOGY WITH ANESTHESIA N/A 03/31/2021   Procedure: STENTING;  Surgeon: [name], MD;  Location: [place];  Service: Radiology;  Laterality: N/A;    Allergies: Ibuprofen, Sulfa antibiotics, and Sulfamethoxazole  Medications: Prior to Admission medications   Medication Sig Start Date End Date Taking? Authorizing Provider  acetaminophen (TYLENOL) 325 MG tablet Take 2 tablets (650 mg total) by mouth every 4 (four) hours as needed for mild pain (or temp > 37.5 C (99.5 F)). Patient taking differently: Take 1,000 mg by mouth every 4 (four) hours as needed for mild pain (or temp > 37.5 C (99.5 F)). 12/17/20   Bailey-Modzik, Mayo Ao A, NP  aspirin 81 MG chewable tablet Chew 1 tablet (81 mg total) by mouth daily. 12/18/20   Bailey-Modzik, Delila A, NP  atorvastatin (  LIPITOR) 20 MG tablet Take 1 tablet (20 mg total) by mouth daily. 02/15/21   Frann Rider, NP  baclofen (LIORESAL) 10 MG tablet Take 0.5 tablets (5 mg total) by mouth 3 (three) times daily. 06/17/21 06/17/22  Raulkar, Clide Deutscher, MD  BRILINTA 90 MG TABS tablet Take 90 mg by mouth 2 (two) times daily. 03/24/21   [provider]  folic acid (FOLVITE) 1 MG tablet Take 1 mg by mouth daily.  03/10/21   [provider]  gabapentin (NEURONTIN) 300 MG capsule Take 1 capsule (300 mg total) by mouth at bedtime. Patient not taking: Reported on 06/30/2021 04/05/21   Frann Rider, NP  Vitamin D, Ergocalciferol, (DRISDOL) 1.25 MG (50000 UNIT) CAPS capsule Take 1 capsule (50,000 Units total) by mouth every 7 (seven) days. 06/17/21   Raulkar, Clide Deutscher, MD     Family History  Problem Relation Age of Onset   Hypertension Mother    Hypertension Father     Social History   Socioeconomic History   Marital status: Unknown    Spouse name: Not on file   Number of children: Not on file   Years of education: Not on file   Highest education level: Not on file  Occupational History   Not on file  Tobacco Use   Smoking status: Former    Packs/day: 2.00    Years: 48.00    Pack years: 96.00    Types: Cigarettes    Quit date: 12/13/2020    Years since quitting: 0.6   Smokeless tobacco: Never  Vaping Use   Vaping Use: Never used  Substance and Sexual Activity   Alcohol use: Yes    Comment: weekly - quit 12/13/20   Drug use: Yes    Types: Marijuana, Cocaine    Comment: last use 12/13/20 for both   Sexual activity: Yes  Other Topics Concern   Not on file  Social History Narrative   Not on file   Social Determinants of Health   Financial Resource Strain: Not on file  Food Insecurity: Not on file  Transportation Needs: Not on file  Physical Activity: Not on file  Stress: Not on file  Social Connections: Not on file     Vital Signs: There were no vitals taken for this visit.  Physical Exam Constitutional:      Appearance: Normal appearance.  HENT:     Head: Normocephalic.  Eyes:     Extraocular Movements: Extraocular movements intact.     Conjunctiva/sclera: Conjunctivae normal.     Pupils: Pupils are equal, round, and reactive to light.  Neurological:     Mental Status: He is alert.     Cranial Nerves: No cranial nerve deficit.     Sensory: Sensory deficit  present.     Motor: No weakness.     Comments: Mild paresthesia of RUE.    Imaging: Carotid duplex 07/07/2021:  Summary:  Right Carotid: Right ICA stent:<50% stenosis.   Left Carotid: Left ICA stent: 50-70% stenosis.   Vertebrals: Bilateral vertebral arteries demonstrate antegrade flow.   Labs:  CBC: Recent Labs    02/07/21 0622 03/02/21 1048 03/31/21 0652 06/30/21 1308  WBC 10.9* 6.8 5.7 6.9  HGB 9.4* 11.6* 12.3* 12.0*  HCT 28.3* 35.5* 38.4* 36.6*  PLT 435* 249 209 226    COAGS: Recent Labs    12/13/20 0151 01/17/21 1444 03/31/21 0652  INR 0.9 0.9 0.9  APTT 28  --   --     BMP: Recent  Labs    02/07/21 0622 03/02/21 1048 03/31/21 0652 06/30/21 1308  NA 134* 136 137 138  K 4.3 4.2 4.2 3.7  CL 103 100 104 103  CO2 _0 GLUCOSE 110* 108* 115* 91  BUN _1 CALCIUM 8.9 10.1 9.4 9.6  CREATININE 0.90 1.00 1.04 1.02  GFRNONAA >60 >60 >60 >60    LIVER FUNCTION TESTS: Recent Labs    02/03/21 0321 02/07/21 0622 03/02/21 1048 06/30/21 1308  BILITOT 0.3 0.5 0.3 0.5  AST 16 92* 23 15  ALT 31 174* 33 14  ALKPHOS 107 273* 141* 110  PROT 6.8 7.5 7.8 7.6  ALBUMIN 3.0* 3.2* 4.7 4.7    Assessment:  Mr. Chuong is doing very well after right carotid artery stent placement with stable neurological exam. I advised him to stop the brilinta while continuing on aspirin 81 mg daily. Follow-up carotid duplex in 1 year.  Signed: [name], MD 07/27/2021, 1:35 PM    I spent a total of    15 Minutes in face to face in clinical consultation, greater than 50% of which was counseling/coordinating care for right carotid artery stenosis status post angioplasty and stenting.

## 2021-07-28 ENCOUNTER — Ambulatory Visit: Payer: Commercial Managed Care - PPO | Admitting: Occupational Therapy

## 2021-07-28 ENCOUNTER — Encounter: Payer: Self-pay | Admitting: Occupational Therapy

## 2021-07-28 ENCOUNTER — Other Ambulatory Visit: Payer: Self-pay

## 2021-07-28 DIAGNOSIS — R208 Other disturbances of skin sensation: Secondary | ICD-10-CM

## 2021-07-28 DIAGNOSIS — G8929 Other chronic pain: Secondary | ICD-10-CM

## 2021-07-28 DIAGNOSIS — M25511 Pain in right shoulder: Secondary | ICD-10-CM | POA: Diagnosis not present

## 2021-07-28 DIAGNOSIS — R278 Other lack of coordination: Secondary | ICD-10-CM

## 2021-07-28 DIAGNOSIS — M6281 Muscle weakness (generalized): Secondary | ICD-10-CM

## 2021-07-28 NOTE — Therapy (Signed)
[facility] [address] [place], [place], [address] Phone: [phone]   Fax:  [phone]  Occupational Therapy Treatment  Patient Details  Name: Dustin Golden MRN: [id number] Date of Birth: Aug 08, 1956 Referring Provider (OT): Leeroy Cha, MD   Encounter Date: 07/28/2021   OT End of Session - 07/28/21 1615     Visit Number 7    Number of Visits 13    Date for OT Re-Evaluation 08/29/21    Authorization Type [employer] Medicare    Authorization Time Period VL: 20 Hard Max    OT Start Time 2706    OT Stop Time 1656    OT Time Calculation (min) 43 min    Activity Tolerance Patient tolerated treatment well    Behavior During Therapy [facility] for tasks assessed/performed             Past Medical History:  Diagnosis Date   Goals of care, counseling/discussion 03/02/2021   HLD (hyperlipidemia)    Hypertension    Stroke [facility])     Past Surgical History:  Procedure Laterality Date   APPENDECTOMY     BIOPSY  01/20/2021   Procedure: BIOPSY;  Surgeon: Arta Silence, MD;  [facility];  Service: Endoscopy;;   COLONOSCOPY WITH PROPOFOL Left 01/20/2021   Procedure: COLONOSCOPY WITH PROPOFOL;  Surgeon: Arta Silence, MD;  [facility];  Service: Endoscopy;  Laterality: Left;   ESOPHAGOGASTRODUODENOSCOPY N/A 01/19/2021   Procedure: ESOPHAGOGASTRODUODENOSCOPY (EGD);  Surgeon: Arta Silence, MD;  Location: [facility];  Service: Endoscopy;  Laterality: N/A;   gastric ulcer surgery     IR ANGIO INTRA EXTRACRAN SEL COM CAROTID INNOMINATE BILAT MOD SED  03/31/2021   IR ANGIO VERTEBRAL SEL SUBCLAVIAN INNOMINATE UNI L MOD SED  03/31/2021   IR ANGIO VERTEBRAL SEL VERTEBRAL UNI R MOD SED  03/31/2021   IR CT HEAD LTD  12/13/2020   IR INTRAVSC STENT CERV CAROTID W/EMB-PROT MOD SED INCL ANGIO  12/14/2020   IR INTRAVSC STENT CERV CAROTID W/EMB-PROT MOD SED INCL ANGIO  03/31/2021   IR PERCUTANEOUS ART THROMBECTOMY/INFUSION  INTRACRANIAL INC DIAG ANGIO  12/13/2020   IR US GUIDE VASC ACCESS RIGHT  12/13/2020   IR US GUIDE VASC ACCESS RIGHT  03/31/2021   LAPAROSCOPIC RIGHT HEMI COLECTOMY N/A 01/25/2021   Procedure: LAPAROSCOPIC RIGHT COLECTOMY;  Surgeon: Ralene Ok, MD;  [facility];  Service: General;  Laterality: N/A;   RADIOLOGY WITH ANESTHESIA N/A 12/13/2020   Procedure: IR WITH ANESTHESIA;  Surgeon: Luanne Bras, MD;  Location: [place];  Service: Radiology;  Laterality: N/A;   RADIOLOGY WITH ANESTHESIA N/A 03/31/2021   Procedure: STENTING;  Surgeon: [name], MD;  Location: [place];  Service: Radiology;  Laterality: N/A;    There were no vitals filed for this visit.   Subjective Assessment - 07/28/21 1614     Subjective  Pt denies any changes. Pt reports the weather is making his hands stiffer.    Pertinent History HTN, Polysubstance Abuse, Neuropathic Pain, L MCA CVA    Patient Stated Goals "get my arm (RUE) up like that (LUE)"    Currently in Pain? No/denies    Pain Score 0-No pain              Wall Slides on foam roll, with RUE on wall with emphasis and mod cueing (tactile and verbal) for scapula positioning, door frame stretches for RUE  Resistance Clothespins 1-4# RUE. Difficulty with sustained pinch on clothespins for placing  on antenna d/t attention and motor planning and some weakness.                       OT Short Term Goals - 07/26/21 1707       OT SHORT TERM GOAL #1   Title Pt will be independent with HEP for RUE shoulder ROM, coordination and grip strength.    Time 4    Period Weeks    Status On-going    Target Date 08/01/21      OT SHORT TERM GOAL #2   Title Pt will report decreased pain in RUE shoulder while completing bathing or driving of 6/19 or less.    Baseline 8/10 pain in RUE shoulder during these tasks    Time 4    Period Weeks    Status Achieved   5/10 pain reported with driving and bathing on 07/14/21, 07/26/21      OT SHORT TERM GOAL #3   Title Pt will increase range of motion of shoulder flexion in RUE to 100* or better with pain no greater than 7/10 for increasing ability to reach in cabinets.    Baseline 85* RUE shoulder flexion, pain 8/10    Time 4    Period Weeks    Status On-going      OT SHORT TERM GOAL #4   Title Pt wil verbalize understanding of adapted strategies for increasing safety and independence with ADLs and IADLs (chopping vegetables, peeling potatoes, etc)    Time 4    Period Weeks    Status On-going      OT SHORT TERM GOAL #5   Title Pt will verbalize understanding of sensory strategies for RUE.    Time 4    Period Weeks    Status On-going               OT Long Term Goals - 07/21/21 1835       OT LONG TERM GOAL #1   Title Pt will be independent with any updated HEPs    Time 8    Period Weeks    Status New      OT LONG TERM GOAL #2   Title Pt will increase range of motion in RUE shoulder flexion to 110* for increasing ability to obtain lightweight object from mid level shelf with decreased pain.    Baseline 85* RUE shoulder flex    Time 8    Period Weeks    Status New      OT LONG TERM GOAL #3   Title Pt will increase abduction range of motion in RUE to 90* or greater for increasing ability to wash under arm and decrease pain in RUE.    Baseline 70*    Time 8    Period Weeks    Status New      OT LONG TERM GOAL #4   Title Pt will increase grip strength in RUE to 35 lbs or greater for increasing dominant hand strength.    Baseline R 28.8 lbs    Time 8    Period Weeks    Status New      OT LONG TERM GOAL #5   Title Pt will complete 9 hole peg test with RUE in 2 minutes or less    Baseline 5 pegs in 2 minutes with RUE    Time 8    Period Weeks    Status New  Plan - 07/28/21 1658     Clinical Impression Statement Pt limited by motor planning deficits. Ctoninuing to progress towards unmet goals.    OT Occupational  Profile and History Problem Focused Assessment - Including review of records relating to presenting problem    Occupational performance deficits (Please refer to evaluation for details): IADL's;ADL's;Work;Leisure    Body Structure / Function / Physical Skills IADL;ROM;ADL;Decreased knowledge of use of DME;Strength;Pain;GMC;Sensation;UE functional use;Flexibility;Mobility;FMC    Cognitive Skills Attention;Safety Awareness    Rehab Potential Good    Clinical Decision Making Limited treatment options, no task modification necessary    Comorbidities Affecting Occupational Performance: None    Modification or Assistance to Complete Evaluation  No modification of tasks or assist necessary to complete eval    OT Frequency 2x / week    OT Duration 8 weeks   12 sessions in 8 weeks d/t any scheduling conflicts   OT Treatment/Interventions Self-care/ADL training;Electrical Stimulation;Ultrasound;Moist Heat;Aquatic Therapy;Fluidtherapy;DME and/or AE instruction;Therapeutic activities;Therapeutic exercise;Neuromuscular education;Manual Therapy;Patient/family education;Passive range of motion;Functional Mobility Training;Cognitive remediation/compensation    Plan continue RUE shoulder ROM, Grip strength, coordination    OT Home Exercise Plan issued brown/tan foam grip for handwriting    Consulted and Agree with Plan of Care Patient             Patient will benefit from skilled therapeutic intervention in order to improve the following deficits and impairments:   Body Structure / Function / Physical Skills: IADL, ROM, ADL, Decreased knowledge of use of DME, Strength, Pain, GMC, Sensation, UE functional use, Flexibility, Mobility, [facility] Cognitive Skills: Attention, Safety Awareness     Visit Diagnosis: Chronic right shoulder pain  Muscle weakness (generalized)  Other disturbances of skin sensation  Other lack of coordination    Problem List Patient Active Problem List   Diagnosis Date Noted    Stenosis of both internal carotid arteries 04/01/2021   Carotid arterial disease ([place]) 03/31/2021   Goals of care, counseling/discussion 03/02/2021   Adenocarcinoma of colon ([place]) 01/28/2021   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

## 2021-08-02 ENCOUNTER — Ambulatory Visit: Payer: Commercial Managed Care - PPO | Admitting: Occupational Therapy

## 2021-08-04 ENCOUNTER — Other Ambulatory Visit: Payer: Self-pay

## 2021-08-04 ENCOUNTER — Encounter: Payer: Self-pay | Admitting: Occupational Therapy

## 2021-08-04 ENCOUNTER — Ambulatory Visit: Payer: Commercial Managed Care - PPO | Admitting: Occupational Therapy

## 2021-08-04 DIAGNOSIS — R278 Other lack of coordination: Secondary | ICD-10-CM

## 2021-08-04 DIAGNOSIS — M6281 Muscle weakness (generalized): Secondary | ICD-10-CM

## 2021-08-04 DIAGNOSIS — M25511 Pain in right shoulder: Secondary | ICD-10-CM | POA: Diagnosis not present

## 2021-08-04 DIAGNOSIS — R208 Other disturbances of skin sensation: Secondary | ICD-10-CM

## 2021-08-04 DIAGNOSIS — G8929 Other chronic pain: Secondary | ICD-10-CM

## 2021-08-04 NOTE — Therapy (Signed)
[facility] [address] [place], [place], [address] Phone: [phone]   Fax:  [phone]  Occupational Therapy Treatment  Patient Details  Name: Dustin Golden MRN: [id number] Date of Birth: August 12, 1956 Referring Provider (OT): Leeroy Cha, MD   Encounter Date: 08/04/2021   OT End of Session - 08/04/21 1449     Visit Number 8    Number of Visits 13    Date for OT Re-Evaluation 08/29/21    Authorization Type [employer] Medicare    Authorization Time Period VL: 20 Hard Max    OT Start Time 1445    OT Stop Time 1530    OT Time Calculation (min) 45 min    Activity Tolerance Patient tolerated treatment well    Behavior During Therapy [facility] for tasks assessed/performed             Past Medical History:  Diagnosis Date   Goals of care, counseling/discussion 03/02/2021   HLD (hyperlipidemia)    Hypertension    Stroke [facility])     Past Surgical History:  Procedure Laterality Date   APPENDECTOMY     BIOPSY  01/20/2021   Procedure: BIOPSY;  Surgeon: Arta Silence, MD;  Location: [facility];  Service: Endoscopy;;   COLONOSCOPY WITH PROPOFOL Left 01/20/2021   Procedure: COLONOSCOPY WITH PROPOFOL;  Surgeon: Arta Silence, MD;  [facility];  Service: Endoscopy;  Laterality: Left;   ESOPHAGOGASTRODUODENOSCOPY N/A 01/19/2021   Procedure: ESOPHAGOGASTRODUODENOSCOPY (EGD);  Surgeon: Arta Silence, MD;  Location: [facility];  Service: Endoscopy;  Laterality: N/A;   gastric ulcer surgery     IR ANGIO INTRA EXTRACRAN SEL COM CAROTID INNOMINATE BILAT MOD SED  03/31/2021   IR ANGIO VERTEBRAL SEL SUBCLAVIAN INNOMINATE UNI L MOD SED  03/31/2021   IR ANGIO VERTEBRAL SEL VERTEBRAL UNI R MOD SED  03/31/2021   IR CT HEAD LTD  12/13/2020   IR INTRAVSC STENT CERV CAROTID W/EMB-PROT MOD SED INCL ANGIO  12/14/2020   IR INTRAVSC STENT CERV CAROTID W/EMB-PROT MOD SED INCL ANGIO  03/31/2021   IR PERCUTANEOUS ART THROMBECTOMY/INFUSION  INTRACRANIAL INC DIAG ANGIO  12/13/2020   IR US GUIDE VASC ACCESS RIGHT  12/13/2020   IR US GUIDE VASC ACCESS RIGHT  03/31/2021   LAPAROSCOPIC RIGHT HEMI COLECTOMY N/A 01/25/2021   Procedure: LAPAROSCOPIC RIGHT COLECTOMY;  Surgeon: Ralene Ok, MD;  Location: [facility];  Service: General;  Laterality: N/A;   RADIOLOGY WITH ANESTHESIA N/A 12/13/2020   Procedure: IR WITH ANESTHESIA;  Surgeon: Luanne Bras, MD;  Location: [facility];  Service: Radiology;  Laterality: N/A;   RADIOLOGY WITH ANESTHESIA N/A 03/31/2021   Procedure: STENTING;  Surgeon: [name], MD;  Location: [facility] [place];  Service: Radiology;  Laterality: N/A;    There were no vitals filed for this visit.   Subjective Assessment - 08/04/21 1449     Subjective  Pt denies any pain reports "doing pretty good"    Pertinent History HTN, Polysubstance Abuse, Neuropathic Pain, L MCA CVA    Patient Stated Goals "get my arm (RUE) up like that (LUE)"    Currently in Pain? No/denies    Pain Score 0-No pain             Physioball Roll Out while in standing on mat x 10 reps with emphasis on bringing scap down and back for stabilization.  Seated and rolling out to right with arm on top for increased range of motion.   Cane/Seated x 10 reps -  chest press, shoulder flexion, horizontal abduction, shoulder extension  Prone on Elbows/Quadruped for scap stabilization exercises  Functional Reach overhead with placing 1 inch blocks in box with RUE  Arm Bike: for conditioning and reciprocal movements on Level 2 for 6 minutes (forward and backward). No pain.                  OT Short Term Goals - 07/26/21 1707       OT SHORT TERM GOAL #1   Title Pt will be independent with HEP for RUE shoulder ROM, coordination and grip strength.    Time 4    Period Weeks    Status On-going    Target Date 08/01/21      OT SHORT TERM GOAL #2   Title Pt will report decreased pain in RUE shoulder while completing bathing or  driving of 9/41 or less.    Baseline 8/10 pain in RUE shoulder during these tasks    Time 4    Period Weeks    Status Achieved   5/10 pain reported with driving and bathing on 07/14/21, 07/26/21     OT SHORT TERM GOAL #3   Title Pt will increase range of motion of shoulder flexion in RUE to 100* or better with pain no greater than 7/10 for increasing ability to reach in cabinets.    Baseline 85* RUE shoulder flexion, pain 8/10    Time 4    Period Weeks    Status On-going      OT SHORT TERM GOAL #4   Title Pt wil verbalize understanding of adapted strategies for increasing safety and independence with ADLs and IADLs (chopping vegetables, peeling potatoes, etc)    Time 4    Period Weeks    Status On-going      OT SHORT TERM GOAL #5   Title Pt will verbalize understanding of sensory strategies for RUE.    Time 4    Period Weeks    Status On-going               OT Long Term Goals - 07/21/21 1835       OT LONG TERM GOAL #1   Title Pt will be independent with any updated HEPs    Time 8    Period Weeks    Status New      OT LONG TERM GOAL #2   Title Pt will increase range of motion in RUE shoulder flexion to 110* for increasing ability to obtain lightweight object from mid level shelf with decreased pain.    Baseline 85* RUE shoulder flex    Time 8    Period Weeks    Status New      OT LONG TERM GOAL #3   Title Pt will increase abduction range of motion in RUE to 90* or greater for increasing ability to wash under arm and decrease pain in RUE.    Baseline 70*    Time 8    Period Weeks    Status New      OT LONG TERM GOAL #4   Title Pt will increase grip strength in RUE to 35 lbs or greater for increasing dominant hand strength.    Baseline R 28.8 lbs    Time 8    Period Weeks    Status New      OT LONG TERM GOAL #5   Title Pt will complete 9 hole peg test with RUE in 2 minutes or less  Baseline 5 pegs in 2 minutes with RUE    Time 8    Period Weeks     Status New                   Plan - 08/04/21 1516     Clinical Impression Statement Pt with improved shoulder range of motion and decreased pain today.    OT Occupational Profile and History Problem Focused Assessment - Including review of records relating to presenting problem    Occupational performance deficits (Please refer to evaluation for details): IADL's;ADL's;Work;Leisure    Body Structure / Function / Physical Skills IADL;ROM;ADL;Decreased knowledge of use of DME;Strength;Pain;GMC;Sensation;UE functional use;Flexibility;Mobility;FMC    Cognitive Skills Attention;Safety Awareness    Rehab Potential Good    Clinical Decision Making Limited treatment options, no task modification necessary    Comorbidities Affecting Occupational Performance: None    Modification or Assistance to Complete Evaluation  No modification of tasks or assist necessary to complete eval    OT Frequency 2x / week    OT Duration 8 weeks   12 sessions in 8 weeks d/t any scheduling conflicts   OT Treatment/Interventions Self-care/ADL training;Electrical Stimulation;Ultrasound;Moist Heat;Aquatic Therapy;Fluidtherapy;DME and/or AE instruction;Therapeutic activities;Therapeutic exercise;Neuromuscular education;Manual Therapy;Patient/family education;Passive range of motion;Functional Mobility Training;Cognitive remediation/compensation    Plan continue RUE shoulder ROM, Grip strength, coordination    OT Home Exercise Plan issued brown/tan foam grip for handwriting    Consulted and Agree with Plan of Care Patient             Patient will benefit from skilled therapeutic intervention in order to improve the following deficits and impairments:   Body Structure / Function / Physical Skills: IADL, ROM, ADL, Decreased knowledge of use of DME, Strength, Pain, GMC, Sensation, UE functional use, Flexibility, Mobility, [facility] Cognitive Skills: Attention, Safety Awareness     Visit Diagnosis: Chronic right  shoulder pain  Muscle weakness (generalized)  Other disturbances of skin sensation  Other lack of coordination    Problem List Patient Active Problem List   Diagnosis Date Noted   Stenosis of both internal carotid arteries 04/01/2021   Carotid arterial disease ([place]) 03/31/2021   Goals of care, counseling/discussion 03/02/2021   Adenocarcinoma of colon ([place]) 01/28/2021   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 [address] [place], [place], [address] Phone: [phone]   Fax:  [phone]  Name: [place]in Golden MRN: [id number] Date of Birth: [birth year]

## 2021-08-09 ENCOUNTER — Ambulatory Visit: Payer: Commercial Managed Care - PPO | Admitting: Occupational Therapy

## 2021-08-09 ENCOUNTER — Encounter: Payer: Self-pay | Admitting: Occupational Therapy

## 2021-08-09 ENCOUNTER — Other Ambulatory Visit: Payer: Self-pay

## 2021-08-09 DIAGNOSIS — M25511 Pain in right shoulder: Secondary | ICD-10-CM

## 2021-08-09 DIAGNOSIS — M6281 Muscle weakness (generalized): Secondary | ICD-10-CM

## 2021-08-09 DIAGNOSIS — R278 Other lack of coordination: Secondary | ICD-10-CM

## 2021-08-09 DIAGNOSIS — R208 Other disturbances of skin sensation: Secondary | ICD-10-CM

## 2021-08-09 DIAGNOSIS — G8929 Other chronic pain: Secondary | ICD-10-CM

## 2021-08-09 NOTE — Therapy (Signed)
[facility] [address] [place], [place], [address] Phone: [phone]   Fax:  [phone]  Occupational Therapy Treatment  Patient Details  Name: Dustin Golden MRN: [id number] Date of Birth: 12/21/1955 Referring Provider (OT): Leeroy Cha, MD   Encounter Date: 08/09/2021   OT End of Session - 08/09/21 1453     Visit Number 9    Number of Visits 13    Date for OT Re-Evaluation 08/29/21    Authorization Type [employer] Medicare    Authorization Time Period VL: 20 Hard Max    OT Start Time 1451    OT Stop Time 1530    OT Time Calculation (min) 39 min    Activity Tolerance Patient tolerated treatment well    Behavior During Therapy [facility] for tasks assessed/performed             Past Medical History:  Diagnosis Date   Goals of care, counseling/discussion 03/02/2021   HLD (hyperlipidemia)    Hypertension    Stroke [facility])     Past Surgical History:  Procedure Laterality Date   APPENDECTOMY     BIOPSY  01/20/2021   Procedure: BIOPSY;  Surgeon: Arta Silence, MD;  [facility];  Service: Endoscopy;;   COLONOSCOPY WITH PROPOFOL Left 01/20/2021   Procedure: COLONOSCOPY WITH PROPOFOL;  Surgeon: Arta Silence, MD;  [facility];  Service: Endoscopy;  Laterality: Left;   ESOPHAGOGASTRODUODENOSCOPY N/A 01/19/2021   Procedure: ESOPHAGOGASTRODUODENOSCOPY (EGD);  Surgeon: Arta Silence, MD;  Location: [facility];  Service: Endoscopy;  Laterality: N/A;   gastric ulcer surgery     IR ANGIO INTRA EXTRACRAN SEL COM CAROTID INNOMINATE BILAT MOD SED  03/31/2021   IR ANGIO VERTEBRAL SEL SUBCLAVIAN INNOMINATE UNI L MOD SED  03/31/2021   IR ANGIO VERTEBRAL SEL VERTEBRAL UNI R MOD SED  03/31/2021   IR CT HEAD LTD  12/13/2020   IR INTRAVSC STENT CERV CAROTID W/EMB-PROT MOD SED INCL ANGIO  12/14/2020   IR INTRAVSC STENT CERV CAROTID W/EMB-PROT MOD SED INCL ANGIO  03/31/2021   IR PERCUTANEOUS ART THROMBECTOMY/INFUSION  INTRACRANIAL INC DIAG ANGIO  12/13/2020   IR US GUIDE VASC ACCESS RIGHT  12/13/2020   IR US GUIDE VASC ACCESS RIGHT  03/31/2021   LAPAROSCOPIC RIGHT HEMI COLECTOMY N/A 01/25/2021   Procedure: LAPAROSCOPIC RIGHT COLECTOMY;  Surgeon: Ralene Ok, MD;  [facility];  Service: General;  Laterality: N/A;   RADIOLOGY WITH ANESTHESIA N/A 12/13/2020   Procedure: IR WITH ANESTHESIA;  Surgeon: Luanne Bras, MD;  Location: [facility];  Service: Radiology;  Laterality: N/A;   RADIOLOGY WITH ANESTHESIA N/A 03/31/2021   Procedure: STENTING;  Surgeon: [name], MD;  Location: [facility];  Service: Radiology;  Laterality: N/A;    There were no vitals filed for this visit.   Subjective Assessment - 08/09/21 1453     Subjective  "pretty good"    Pertinent History HTN, Polysubstance Abuse, Neuropathic Pain, L MCA CVA    Patient Stated Goals "get my arm (RUE) up like that (LUE)"    Currently in Pain? No/denies    Pain Score 0-No pain             Medium Pegs demo better coordination - completing with RUE with mod difficulty. Removed with in hand manipulation with max difficulty.                        OT Short Term Goals - 08/09/21 1454  OT SHORT TERM GOAL #1   Title Pt will be independent with HEP for RUE shoulder ROM, coordination and grip strength.    Time 4    Period Weeks    Status Achieved    Target Date 08/01/21      OT SHORT TERM GOAL #2   Title Pt will report decreased pain in RUE shoulder while completing bathing or driving of 3/01 or less.    Baseline 8/10 pain in RUE shoulder during these tasks    Time 4    Period Weeks    Status Achieved   5/10 pain reported with driving and bathing on 07/14/21, 07/26/21     OT SHORT TERM GOAL #3   Title Pt will increase range of motion of shoulder flexion in RUE to 100* or better with pain no greater than 7/10 for increasing ability to reach in cabinets.    Baseline 85* RUE shoulder flexion, pain  8/10    Time 4    Period Weeks    Status On-going   95* 08/09/21 with 6/10 pain     OT SHORT TERM GOAL #4   Title Pt wil verbalize understanding of adapted strategies for increasing safety and independence with ADLs and IADLs (chopping vegetables, peeling potatoes, etc)    Time 4    Period Weeks    Status Achieved      OT SHORT TERM GOAL #5   Title Pt will verbalize understanding of sensory strategies for RUE.    Time 4    Period Weeks    Status Achieved               OT Long Term Goals - 07/21/21 1835       OT LONG TERM GOAL #1   Title Pt will be independent with any updated HEPs    Time 8    Period Weeks    Status New      OT LONG TERM GOAL #2   Title Pt will increase range of motion in RUE shoulder flexion to 110* for increasing ability to obtain lightweight object from mid level shelf with decreased pain.    Baseline 85* RUE shoulder flex    Time 8    Period Weeks    Status New      OT LONG TERM GOAL #3   Title Pt will increase abduction range of motion in RUE to 90* or greater for increasing ability to wash under arm and decrease pain in RUE.    Baseline 70*    Time 8    Period Weeks    Status New      OT LONG TERM GOAL #4   Title Pt will increase grip strength in RUE to 35 lbs or greater for increasing dominant hand strength.    Baseline R 28.8 lbs    Time 8    Period Weeks    Status New      OT LONG TERM GOAL #5   Title Pt will complete 9 hole peg test with RUE in 2 minutes or less    Baseline 5 pegs in 2 minutes with RUE    Time 8    Period Weeks    Status New                   Plan - 08/09/21 1531     Clinical Impression Statement Pt demonstrating improved coordination today with RUE.    OT Occupational Profile and History Problem Focused Assessment -  Including review of records relating to presenting problem    Occupational performance deficits (Please refer to evaluation for details): IADL's;ADL's;Work;Leisure    Body Structure /  Function / Physical Skills IADL;ROM;ADL;Decreased knowledge of use of DME;Strength;Pain;GMC;Sensation;UE functional use;Flexibility;Mobility;FMC    Cognitive Skills Attention;Safety Awareness    Rehab Potential Good    Clinical Decision Making Limited treatment options, no task modification necessary    Comorbidities Affecting Occupational Performance: None    Modification or Assistance to Complete Evaluation  No modification of tasks or assist necessary to complete eval    OT Frequency 2x / week    OT Duration 8 weeks   12 sessions in 8 weeks d/t any scheduling conflicts   OT Treatment/Interventions Self-care/ADL training;Electrical Stimulation;Ultrasound;Moist Heat;Aquatic Therapy;Fluidtherapy;DME and/or AE instruction;Therapeutic activities;Therapeutic exercise;Neuromuscular education;Manual Therapy;Patient/family education;Passive range of motion;Functional Mobility Training;Cognitive remediation/compensation    Plan continue RUE shoulder ROM, Grip strength, coordination    OT Home Exercise Plan issued brown/tan foam grip for handwriting    Consulted and Agree with Plan of Care Patient             Patient will benefit from skilled therapeutic intervention in order to improve the following deficits and impairments:   Body Structure / Function / Physical Skills: IADL, ROM, ADL, Decreased knowledge of use of DME, Strength, Pain, GMC, Sensation, UE functional use, Flexibility, Mobility, [facility] Cognitive Skills: Attention, Safety Awareness     Visit Diagnosis: Chronic right shoulder pain  Muscle weakness (generalized)  Other disturbances of skin sensation  Other lack of coordination    Problem List Patient Active Problem List   Diagnosis Date Noted   Stenosis of both internal carotid arteries 04/01/2021   Carotid arterial disease ([place]) 03/31/2021   Goals of care, counseling/discussion 03/02/2021   Adenocarcinoma of colon ([place]) 01/28/2021   Polyp of cecum with bleeding and  high-grade dysplasia.  Probable cancer. 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 [place], [place], [address] Phone: [phone]   Fax:  [phone]  Name: Dustin Golden MRN: [id number] Date of Birth: May 11, 1956

## 2021-08-11 ENCOUNTER — Ambulatory Visit: Payer: Commercial Managed Care - PPO | Admitting: Occupational Therapy

## 2021-08-11 ENCOUNTER — Encounter: Payer: Self-pay | Admitting: Occupational Therapy

## 2021-08-11 ENCOUNTER — Other Ambulatory Visit: Payer: Self-pay

## 2021-08-11 DIAGNOSIS — M6281 Muscle weakness (generalized): Secondary | ICD-10-CM

## 2021-08-11 DIAGNOSIS — R208 Other disturbances of skin sensation: Secondary | ICD-10-CM

## 2021-08-11 DIAGNOSIS — R278 Other lack of coordination: Secondary | ICD-10-CM

## 2021-08-11 DIAGNOSIS — M25511 Pain in right shoulder: Secondary | ICD-10-CM | POA: Diagnosis not present

## 2021-08-11 DIAGNOSIS — G8929 Other chronic pain: Secondary | ICD-10-CM

## 2021-08-11 NOTE — Therapy (Signed)
[facility] [address] [place], [place], [address] Phone: [phone]   Fax:  [phone]  Occupational Therapy Treatment  Patient Details  Name: Dustin Golden MRN: [id number] Date of Birth: 1956/04/10 Referring Provider (OT): Leeroy Cha, MD   Encounter Date: 08/11/2021   OT End of Session - 08/11/21 1447     Visit Number 10    Number of Visits 13    Date for OT Re-Evaluation 08/29/21    Authorization Type [employer] Medicare    Authorization Time Period VL: 20 Hard Max    OT Start Time 1446    OT Stop Time 1530    OT Time Calculation (min) 44 min    Activity Tolerance Patient tolerated treatment well    Behavior During Therapy [facility] for tasks assessed/performed             Past Medical History:  Diagnosis Date   Goals of care, counseling/discussion 03/02/2021   HLD (hyperlipidemia)    Hypertension    Stroke [facility])     Past Surgical History:  Procedure Laterality Date   APPENDECTOMY     BIOPSY  01/20/2021   Procedure: BIOPSY;  Surgeon: Arta Silence, MD;  Location: [facility];  Service: Endoscopy;;   COLONOSCOPY WITH PROPOFOL Left 01/20/2021   Procedure: COLONOSCOPY WITH PROPOFOL;  Surgeon: Arta Silence, MD;  [facility];  Service: Endoscopy;  Laterality: Left;   ESOPHAGOGASTRODUODENOSCOPY N/A 01/19/2021   Procedure: ESOPHAGOGASTRODUODENOSCOPY (EGD);  Surgeon: Arta Silence, MD;  Location: [facility];  Service: Endoscopy;  Laterality: N/A;   gastric ulcer surgery     IR ANGIO INTRA EXTRACRAN SEL COM CAROTID INNOMINATE BILAT MOD SED  03/31/2021   IR ANGIO VERTEBRAL SEL SUBCLAVIAN INNOMINATE UNI L MOD SED  03/31/2021   IR ANGIO VERTEBRAL SEL VERTEBRAL UNI R MOD SED  03/31/2021   IR CT HEAD LTD  12/13/2020   IR INTRAVSC STENT CERV CAROTID W/EMB-PROT MOD SED INCL ANGIO  12/14/2020   IR INTRAVSC STENT CERV CAROTID W/EMB-PROT MOD SED INCL ANGIO  03/31/2021   IR PERCUTANEOUS ART THROMBECTOMY/INFUSION  INTRACRANIAL INC DIAG ANGIO  12/13/2020   IR US GUIDE VASC ACCESS RIGHT  12/13/2020   IR US GUIDE VASC ACCESS RIGHT  03/31/2021   LAPAROSCOPIC RIGHT HEMI COLECTOMY N/A 01/25/2021   Procedure: LAPAROSCOPIC RIGHT COLECTOMY;  Surgeon: Ralene Ok, MD;  Location: [facility];  Service: General;  Laterality: N/A;   RADIOLOGY WITH ANESTHESIA N/A 12/13/2020   Procedure: IR WITH ANESTHESIA;  Surgeon: Luanne Bras, MD;  Location: [facility];  Service: Radiology;  Laterality: N/A;   RADIOLOGY WITH ANESTHESIA N/A 03/31/2021   Procedure: STENTING;  Surgeon: [name], MD;  Location: [facility];  Service: Radiology;  Laterality: N/A;    There were no vitals filed for this visit.   Subjective Assessment - 08/11/21 1447     Subjective  "it's been slowing down a lot" (about a work)    Pertinent History HTN, Polysubstance Abuse, Neuropathic Pain, L MCA CVA    Patient Stated Goals "get my arm (RUE) up like that (LUE)"    Currently in Pain? No/denies    Pain Score 0-No pain              Abduction  while seated edge of mat on physioball to RUE and then again for gentle forward flexion/reaching  Resistance Clothespins 1-8# with RUE with emphasis on keeping scap down and back to reduce humeral head from rotating forward - min cues  Stacking Pennies/in hand manipulation with RUE with min difficulty.                      OT Short Term Goals - 08/09/21 1454       OT SHORT TERM GOAL #1   Title Pt will be independent with HEP for RUE shoulder ROM, coordination and grip strength.    Time 4    Period Weeks    Status Achieved    Target Date 08/01/21      OT SHORT TERM GOAL #2   Title Pt will report decreased pain in RUE shoulder while completing bathing or driving of 5/39 or less.    Baseline 8/10 pain in RUE shoulder during these tasks    Time 4    Period Weeks    Status Achieved   5/10 pain reported with driving and bathing on 07/14/21, 07/26/21     OT SHORT TERM  GOAL #3   Title Pt will increase range of motion of shoulder flexion in RUE to 100* or better with pain no greater than 7/10 for increasing ability to reach in cabinets.    Baseline 85* RUE shoulder flexion, pain 8/10    Time 4    Period Weeks    Status On-going   95* 08/09/21 with 6/10 pain     OT SHORT TERM GOAL #4   Title Pt wil verbalize understanding of adapted strategies for increasing safety and independence with ADLs and IADLs (chopping vegetables, peeling potatoes, etc)    Time 4    Period Weeks    Status Achieved      OT SHORT TERM GOAL #5   Title Pt will verbalize understanding of sensory strategies for RUE.    Time 4    Period Weeks    Status Achieved               OT Long Term Goals - 07/21/21 1835       OT LONG TERM GOAL #1   Title Pt will be independent with any updated HEPs    Time 8    Period Weeks    Status New      OT LONG TERM GOAL #2   Title Pt will increase range of motion in RUE shoulder flexion to 110* for increasing ability to obtain lightweight object from mid level shelf with decreased pain.    Baseline 85* RUE shoulder flex    Time 8    Period Weeks    Status New      OT LONG TERM GOAL #3   Title Pt will increase abduction range of motion in RUE to 90* or greater for increasing ability to wash under arm and decrease pain in RUE.    Baseline 70*    Time 8    Period Weeks    Status New      OT LONG TERM GOAL #4   Title Pt will increase grip strength in RUE to 35 lbs or greater for increasing dominant hand strength.    Baseline R 28.8 lbs    Time 8    Period Weeks    Status New      OT LONG TERM GOAL #5   Title Pt will complete 9 hole peg test with RUE in 2 minutes or less    Baseline 5 pegs in 2 minutes with RUE    Time 8    Period Weeks    Status New  Plan - 08/11/21 1517     Clinical Impression Statement Pt continues to demonstrate increased cooridnation and motor planning with RUE and better  range of motion in shoulder and decreased pain.    OT Occupational Profile and History Problem Focused Assessment - Including review of records relating to presenting problem    Occupational performance deficits (Please refer to evaluation for details): IADL's;ADL's;Work;Leisure    Body Structure / Function / Physical Skills IADL;ROM;ADL;Decreased knowledge of use of DME;Strength;Pain;GMC;Sensation;UE functional use;Flexibility;Mobility;FMC    Cognitive Skills Attention;Safety Awareness    Rehab Potential Good    Clinical Decision Making Limited treatment options, no task modification necessary    Comorbidities Affecting Occupational Performance: None    Modification or Assistance to Complete Evaluation  No modification of tasks or assist necessary to complete eval    OT Frequency 2x / week    OT Duration 8 weeks   12 sessions in 8 weeks d/t any scheduling conflicts   OT Treatment/Interventions Self-care/ADL training;Electrical Stimulation;Ultrasound;Moist Heat;Aquatic Therapy;Fluidtherapy;DME and/or AE instruction;Therapeutic activities;Therapeutic exercise;Neuromuscular education;Manual Therapy;Patient/family education;Passive range of motion;Functional Mobility Training;Cognitive remediation/compensation    Plan continue RUE shoulder ROM, Grip strength, coordination    OT Home Exercise Plan issued brown/tan foam grip for handwriting    Consulted and Agree with Plan of Care Patient             Patient will benefit from skilled therapeutic intervention in order to improve the following deficits and impairments:   Body Structure / Function / Physical Skills: IADL, ROM, ADL, Decreased knowledge of use of DME, Strength, Pain, GMC, Sensation, UE functional use, Flexibility, Mobility, [facility] Cognitive Skills: Attention, Safety Awareness     Visit Diagnosis: Chronic right shoulder pain  Muscle weakness (generalized)  Other disturbances of skin sensation  Other lack of  coordination    Problem List Patient Active Problem List   Diagnosis Date Noted   Stenosis of both internal carotid arteries 04/01/2021   Carotid arterial disease ([place]) 03/31/2021   Goals of care, counseling/discussion 03/02/2021   Adenocarcinoma of colon ([place]) 01/28/2021   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 [place], [place], [address] Phone: [phone]   Fax:  [phone]  Name: Dustin Golden MRN: [id number] Date of Birth: 07/19/1956

## 2021-08-16 ENCOUNTER — Other Ambulatory Visit: Payer: Self-pay

## 2021-08-16 ENCOUNTER — Ambulatory Visit: Payer: Commercial Managed Care - PPO | Attending: Physical Medicine and Rehabilitation | Admitting: Occupational Therapy

## 2021-08-16 ENCOUNTER — Encounter: Payer: Self-pay | Admitting: Occupational Therapy

## 2021-08-16 DIAGNOSIS — R278 Other lack of coordination: Secondary | ICD-10-CM | POA: Diagnosis present

## 2021-08-16 DIAGNOSIS — R208 Other disturbances of skin sensation: Secondary | ICD-10-CM | POA: Diagnosis not present

## 2021-08-16 DIAGNOSIS — M6281 Muscle weakness (generalized): Secondary | ICD-10-CM | POA: Insufficient documentation

## 2021-08-16 DIAGNOSIS — M25511 Pain in right shoulder: Secondary | ICD-10-CM | POA: Diagnosis present

## 2021-08-16 DIAGNOSIS — G8929 Other chronic pain: Secondary | ICD-10-CM | POA: Insufficient documentation

## 2021-08-16 NOTE — Therapy (Signed)
[facility] [address] [place], [place], [address] Phone: [phone]   Fax:  [phone]  Occupational Therapy Treatment  Patient Details  Name: Dustin Golden MRN: [id number] Date of Birth: 05/10/1956 Referring Provider (OT): Leeroy Cha, MD   Encounter Date: 08/16/2021   OT End of Session - 08/16/21 1448     Visit Number 11    Number of Visits 13    Date for OT Re-Evaluation 08/29/21    Authorization Type [employer] Medicare    Authorization Time Period VL: 20 Hard Max    OT Start Time 1446    OT Stop Time 1530    OT Time Calculation (min) 44 min    Activity Tolerance Patient tolerated treatment well    Behavior During Therapy [facility] for tasks assessed/performed             Past Medical History:  Diagnosis Date   Goals of care, counseling/discussion 03/02/2021   HLD (hyperlipidemia)    Hypertension    Stroke [facility])     Past Surgical History:  Procedure Laterality Date   APPENDECTOMY     BIOPSY  01/20/2021   Procedure: BIOPSY;  Surgeon: Arta Silence, MD;  [facility];  Service: Endoscopy;;   COLONOSCOPY WITH PROPOFOL Left 01/20/2021   Procedure: COLONOSCOPY WITH PROPOFOL;  Surgeon: Arta Silence, MD;  [facility];  Service: Endoscopy;  Laterality: Left;   ESOPHAGOGASTRODUODENOSCOPY N/A 01/19/2021   Procedure: ESOPHAGOGASTRODUODENOSCOPY (EGD);  Surgeon: Arta Silence, MD;  Location: [facility];  Service: Endoscopy;  Laterality: N/A;   gastric ulcer surgery     IR ANGIO INTRA EXTRACRAN SEL COM CAROTID INNOMINATE BILAT MOD SED  03/31/2021   IR ANGIO VERTEBRAL SEL SUBCLAVIAN INNOMINATE UNI L MOD SED  03/31/2021   IR ANGIO VERTEBRAL SEL VERTEBRAL UNI R MOD SED  03/31/2021   IR CT HEAD LTD  12/13/2020   IR INTRAVSC STENT CERV CAROTID W/EMB-PROT MOD SED INCL ANGIO  12/14/2020   IR INTRAVSC STENT CERV CAROTID W/EMB-PROT MOD SED INCL ANGIO  03/31/2021   IR PERCUTANEOUS ART THROMBECTOMY/INFUSION  INTRACRANIAL INC DIAG ANGIO  12/13/2020   IR US GUIDE VASC ACCESS RIGHT  12/13/2020   IR US GUIDE VASC ACCESS RIGHT  03/31/2021   LAPAROSCOPIC RIGHT HEMI COLECTOMY N/A 01/25/2021   Procedure: LAPAROSCOPIC RIGHT COLECTOMY;  Surgeon: Ralene Ok, MD;  [facility];  Service: General;  Laterality: N/A;   RADIOLOGY WITH ANESTHESIA N/A 12/13/2020   Procedure: IR WITH ANESTHESIA;  Surgeon: Luanne Bras, MD;  Location: [place];  Service: Radiology;  Laterality: N/A;   RADIOLOGY WITH ANESTHESIA N/A 03/31/2021   Procedure: STENTING;  Surgeon: [name], MD;  Location: [place];  Service: Radiology;  Laterality: N/A;    There were no vitals filed for this visit.    Small Pegs with RUE. Mod/max difficulty and drops with RUE manipulating small pegs. Pt copied pattern with 100% accuracy. Increased time d/t coordination difficulties.  Arm Bike: for conditioning and reciprocal movements on Level 2 for 6 minutes forward and 2 minutes backward                         OT Short Term Goals - 08/09/21 1454       OT SHORT TERM GOAL #1   Title Pt will be independent with HEP for RUE shoulder ROM, coordination and grip strength.    Time 4    Period Weeks  Status Achieved    Target Date 08/01/21      OT SHORT TERM GOAL #2   Title Pt will report decreased pain in RUE shoulder while completing bathing or driving of 7/89 or less.    Baseline 8/10 pain in RUE shoulder during these tasks    Time 4    Period Weeks    Status Achieved   5/10 pain reported with driving and bathing on 07/14/21, 07/26/21     OT SHORT TERM GOAL #3   Title Pt will increase range of motion of shoulder flexion in RUE to 100* or better with pain no greater than 7/10 for increasing ability to reach in cabinets.    Baseline 85* RUE shoulder flexion, pain 8/10    Time 4    Period Weeks    Status On-going   95* 08/09/21 with 6/10 pain     OT SHORT TERM GOAL #4   Title Pt wil verbalize  understanding of adapted strategies for increasing safety and independence with ADLs and IADLs (chopping vegetables, peeling potatoes, etc)    Time 4    Period Weeks    Status Achieved      OT SHORT TERM GOAL #5   Title Pt will verbalize understanding of sensory strategies for RUE.    Time 4    Period Weeks    Status Achieved               OT Long Term Goals - 07/21/21 1835       OT LONG TERM GOAL #1   Title Pt will be independent with any updated HEPs    Time 8    Period Weeks    Status New      OT LONG TERM GOAL #2   Title Pt will increase range of motion in RUE shoulder flexion to 110* for increasing ability to obtain lightweight object from mid level shelf with decreased pain.    Baseline 85* RUE shoulder flex    Time 8    Period Weeks    Status New      OT LONG TERM GOAL #3   Title Pt will increase abduction range of motion in RUE to 90* or greater for increasing ability to wash under arm and decrease pain in RUE.    Baseline 70*    Time 8    Period Weeks    Status New      OT LONG TERM GOAL #4   Title Pt will increase grip strength in RUE to 35 lbs or greater for increasing dominant hand strength.    Baseline R 28.8 lbs    Time 8    Period Weeks    Status New      OT LONG TERM GOAL #5   Title Pt will complete 9 hole peg test with RUE in 2 minutes or less    Baseline 5 pegs in 2 minutes with RUE    Time 8    Period Weeks    Status New                    Patient will benefit from skilled therapeutic intervention in order to improve the following deficits and impairments:           Visit Diagnosis: Other disturbances of skin sensation  Muscle weakness (generalized)  Other lack of coordination    Problem List Patient Active Problem List   Diagnosis Date Noted   Stenosis of both internal carotid  arteries 04/01/2021   Carotid arterial disease ([place]) 03/31/2021   Goals of care, counseling/discussion 03/02/2021   Adenocarcinoma of  colon ([place]) 01/28/2021   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

## 2021-08-18 ENCOUNTER — Other Ambulatory Visit: Payer: Self-pay

## 2021-08-18 ENCOUNTER — Encounter: Payer: Self-pay | Admitting: Occupational Therapy

## 2021-08-18 ENCOUNTER — Ambulatory Visit: Payer: Commercial Managed Care - PPO | Admitting: Occupational Therapy

## 2021-08-18 DIAGNOSIS — R278 Other lack of coordination: Secondary | ICD-10-CM

## 2021-08-18 DIAGNOSIS — R208 Other disturbances of skin sensation: Secondary | ICD-10-CM

## 2021-08-18 DIAGNOSIS — M6281 Muscle weakness (generalized): Secondary | ICD-10-CM

## 2021-08-18 DIAGNOSIS — G8929 Other chronic pain: Secondary | ICD-10-CM

## 2021-08-18 NOTE — Therapy (Signed)
[facility] [address] [place], [place], [address] Phone: [phone]   Fax:  [phone]  Occupational Therapy Treatment  Patient Details  Name: Dustin Golden MRN: [id number] Date of Birth: 1956-04-20 Referring Provider (OT): Leeroy Cha, MD   Encounter Date: 08/18/2021   OT End of Session - 08/18/21 1536     Visit Number 12    Number of Visits 13    Date for OT Re-Evaluation 08/29/21    Authorization Type U[employer] Medicare    Authorization Time Period VL: 20 Hard Max    OT Start Time 1740    OT Stop Time 1615    OT Time Calculation (min) 43 min    Activity Tolerance Patient tolerated treatment well    Behavior During Therapy [facility] for tasks assessed/performed             Past Medical History:  Diagnosis Date   Goals of care, counseling/discussion 03/02/2021   HLD (hyperlipidemia)    Hypertension    Stroke [facility])     Past Surgical History:  Procedure Laterality Date   APPENDECTOMY     BIOPSY  01/20/2021   Procedure: BIOPSY;  Surgeon: Arta Silence, MD;  Location: [facility];  Service: Endoscopy;;   COLONOSCOPY WITH PROPOFOL Left 01/20/2021   Procedure: COLONOSCOPY WITH PROPOFOL;  Surgeon: Arta Silence, MD;  [facility];  Service: Endoscopy;  Laterality: Left;   ESOPHAGOGASTRODUODENOSCOPY N/A 01/19/2021   Procedure: ESOPHAGOGASTRODUODENOSCOPY (EGD);  Surgeon: Arta Silence, MD;  Location: [facility];  Service: Endoscopy;  Laterality: N/A;   gastric ulcer surgery     IR ANGIO INTRA EXTRACRAN SEL COM CAROTID INNOMINATE BILAT MOD SED  03/31/2021   IR ANGIO VERTEBRAL SEL SUBCLAVIAN INNOMINATE UNI L MOD SED  03/31/2021   IR ANGIO VERTEBRAL SEL VERTEBRAL UNI R MOD SED  03/31/2021   IR CT HEAD LTD  12/13/2020   IR INTRAVSC STENT CERV CAROTID W/EMB-PROT MOD SED INCL ANGIO  12/14/2020   IR INTRAVSC STENT CERV CAROTID W/EMB-PROT MOD SED INCL ANGIO  03/31/2021   IR PERCUTANEOUS ART THROMBECTOMY/INFUSION  INTRACRANIAL INC DIAG ANGIO  12/13/2020   IR US GUIDE VASC ACCESS RIGHT  12/13/2020   IR US GUIDE VASC ACCESS RIGHT  03/31/2021   LAPAROSCOPIC RIGHT HEMI COLECTOMY N/A 01/25/2021   Procedure: LAPAROSCOPIC RIGHT COLECTOMY;  Surgeon: Ralene Ok, MD;  [facility];  Service: General;  Laterality: N/A;   RADIOLOGY WITH ANESTHESIA N/A 12/13/2020   Procedure: IR WITH ANESTHESIA;  Surgeon: Luanne Bras, MD;  Location: [facility];  Service: Radiology;  Laterality: N/A;   RADIOLOGY WITH ANESTHESIA N/A 03/31/2021   Procedure: STENTING;  Surgeon: [name], MD;  Location: [facility];  Service: Radiology;  Laterality: N/A;    There were no vitals filed for this visit.   Subjective Assessment - 08/18/21 1536     Subjective  "i'm the biscuit guy"    Pertinent History HTN, Polysubstance Abuse, Neuropathic Pain, L MCA CVA    Patient Stated Goals "get my arm (RUE) up like that (LUE)"    Currently in Pain? No/denies    Pain Score 0-No pain              Hand Gripper: with RUE on level 1 with black spring. Pt picked up 1 inch blocks with gripper with min drops and mod difficulty. Mostly d/t motor planning and attention.  Manipulation/Coordination RUE with rotating ball in fingertips - mod drops/max difficulty - trialed with golf balls  UE Ranger in standing with RUE with upward reaching and flexion and horizontal abduction.  Stretch cross body for scap stretch for RUE- issued for home with return demonstration. Would benefit from further education and review.                        OT Short Term Goals - 08/18/21 1603       OT SHORT TERM GOAL #1   Title Pt will be independent with HEP for RUE shoulder ROM, coordination and grip strength.    Time 4    Period Weeks    Status Achieved    Target Date 08/01/21      OT SHORT TERM GOAL #2   Title Pt will report decreased pain in RUE shoulder while completing bathing or driving of 8/84 or less.    Baseline  8/10 pain in RUE shoulder during these tasks    Time 4    Period Weeks    Status Achieved   5/10 pain reported with driving and bathing on 07/14/21, 07/26/21     OT SHORT TERM GOAL #3   Title Pt will increase range of motion of shoulder flexion in RUE to 100* or better with pain no greater than 7/10 for increasing ability to reach in cabinets.    Baseline 85* RUE shoulder flexion, pain 8/10    Time 4    Period Weeks    Status On-going   95* 08/09/21 with 6/10 pain, 08/18/21 95* 6/10 pain     OT SHORT TERM GOAL #4   Title Pt wil verbalize understanding of adapted strategies for increasing safety and independence with ADLs and IADLs (chopping vegetables, peeling potatoes, etc)    Time 4    Period Weeks    Status Achieved      OT SHORT TERM GOAL #5   Title Pt will verbalize understanding of sensory strategies for RUE.    Time 4    Period Weeks    Status Achieved               OT Long Term Goals - 08/18/21 1556       OT LONG TERM GOAL #1   Title Pt will be independent with any updated HEPs    Time 8    Period Weeks    Status On-going      OT LONG TERM GOAL #2   Title Pt will increase range of motion in RUE shoulder flexion to 110* for increasing ability to obtain lightweight object from mid level shelf with decreased pain.    Baseline 85* RUE shoulder flex    Time 8    Period Weeks    Status On-going   95* shoulder flexion 08/18/21     OT LONG TERM GOAL #3   Title Pt will increase abduction range of motion in RUE to 90* or greater for increasing ability to wash under arm and decrease pain in RUE.    Baseline 70*    Time 8    Period Weeks    Status On-going   85* 08/18/21     OT LONG TERM GOAL #4   Title Pt will increase grip strength in RUE to 35 lbs or greater for increasing dominant hand strength.    Baseline R 28.8 lbs    Time 8    Period Weeks    Status Achieved   35.4 lbs with RUE 08/18/21     OT LONG TERM GOAL #5  Title Pt will complete 9 hole peg test with  RUE in 2 minutes or less    Baseline 5 pegs in 2 minutes with RUE    Time 8    Period Weeks    Status On-going   6 pegs in 2 minutes 08/18/21                  Plan - 08/18/21 1619     Clinical Impression Statement Pt continuing with good progress. Pt has met grip strength goal and is increasing with coordination however has not improved to goal level, yet. Will complete recertification next session and add a few visits to continue progress.    OT Occupational Profile and History Problem Focused Assessment - Including review of records relating to presenting problem    Occupational performance deficits (Please refer to evaluation for details): IADL's;ADL's;Work;Leisure    Body Structure / Function / Physical Skills IADL;ROM;ADL;Decreased knowledge of use of DME;Strength;Pain;GMC;Sensation;UE functional use;Flexibility;Mobility;FMC    Cognitive Skills Attention;Safety Awareness    Rehab Potential Good    Clinical Decision Making Limited treatment options, no task modification necessary    Comorbidities Affecting Occupational Performance: None    Modification or Assistance to Complete Evaluation  No modification of tasks or assist necessary to complete eval    OT Frequency 2x / week    OT Duration 8 weeks   12 sessions in 8 weeks d/t any scheduling conflicts   OT Treatment/Interventions Self-care/ADL training;Electrical Stimulation;Ultrasound;Moist Heat;Aquatic Therapy;Fluidtherapy;DME and/or AE instruction;Therapeutic activities;Therapeutic exercise;Neuromuscular education;Manual Therapy;Patient/family education;Passive range of motion;Functional Mobility Training;Cognitive remediation/compensation    Plan continue RUE shoulder ROM, coordination, recertification and add a few visits - visit limit of 20    OT Home Exercise Plan issued brown/tan foam grip for handwriting    Consulted and Agree with Plan of Care Patient             Patient will benefit from skilled therapeutic  intervention in order to improve the following deficits and impairments:   Body Structure / Function / Physical Skills: IADL, ROM, ADL, Decreased knowledge of use of DME, Strength, Pain, GMC, Sensation, UE functional use, Flexibility, Mobility, [facility] Cognitive Skills: Attention, Safety Awareness     Visit Diagnosis: Other disturbances of skin sensation  Other lack of coordination  Muscle weakness (generalized)  Chronic right shoulder pain    Problem List Patient Active Problem List   Diagnosis Date Noted   Stenosis of both internal carotid arteries 04/01/2021   Carotid arterial disease ([place]) 03/31/2021   Goals of care, counseling/discussion 03/02/2021   Adenocarcinoma of colon ([place]) 01/28/2021   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 [place], [place], [address] Phone: [phone]   Fax:  [phone]  Name: Dustin Golden MRN: [id number] Date of Birth: [birth year]

## 2021-08-23 ENCOUNTER — Other Ambulatory Visit: Payer: Self-pay

## 2021-08-23 ENCOUNTER — Encounter: Payer: Self-pay | Admitting: Occupational Therapy

## 2021-08-23 ENCOUNTER — Ambulatory Visit: Payer: Commercial Managed Care - PPO | Admitting: Occupational Therapy

## 2021-08-23 DIAGNOSIS — G8929 Other chronic pain: Secondary | ICD-10-CM

## 2021-08-23 DIAGNOSIS — R208 Other disturbances of skin sensation: Secondary | ICD-10-CM

## 2021-08-23 DIAGNOSIS — R278 Other lack of coordination: Secondary | ICD-10-CM

## 2021-08-23 DIAGNOSIS — M6281 Muscle weakness (generalized): Secondary | ICD-10-CM

## 2021-08-23 NOTE — Therapy (Signed)
[facility] [address] [place], [place], [address] Phone: [phone]   Fax:  [phone]  Occupational Therapy Treatment & Recertification  Patient Details  Name: Dustin Golden MRN: [id number] Date of Birth: 08-Jun-1956 Referring Provider (OT): Leeroy Cha, MD   Encounter Date: 08/23/2021   OT End of Session - 08/23/21 1450     Visit Number 13    Number of Visits 20   13+7   Date for OT Re-Evaluation 10/04/21   7 visits over 6 weeks   Authorization Type [employer] Medicare    Authorization Time Period VL: 61 Hard Max    Authorization - Number of Visits 20    OT Start Time 1448    OT Stop Time 1530    OT Time Calculation (min) 42 min    Activity Tolerance Patient tolerated treatment well    Behavior During Therapy [facility] for tasks assessed/performed             Past Medical History:  Diagnosis Date   Goals of care, counseling/discussion 03/02/2021   HLD (hyperlipidemia)    Hypertension    Stroke [facility])     Past Surgical History:  Procedure Laterality Date   APPENDECTOMY     BIOPSY  01/20/2021   Procedure: BIOPSY;  Surgeon: Arta Silence, MD;  [facility];  Service: Endoscopy;;   COLONOSCOPY WITH PROPOFOL Left 01/20/2021   Procedure: COLONOSCOPY WITH PROPOFOL;  Surgeon: Arta Silence, MD;  [facility];  Service: Endoscopy;  Laterality: Left;   ESOPHAGOGASTRODUODENOSCOPY N/A 01/19/2021   Procedure: ESOPHAGOGASTRODUODENOSCOPY (EGD);  Surgeon: Arta Silence, MD;  [facility];  Service: Endoscopy;  Laterality: N/A;   gastric ulcer surgery     IR ANGIO INTRA EXTRACRAN SEL COM CAROTID INNOMINATE BILAT MOD SED  03/31/2021   IR ANGIO VERTEBRAL SEL SUBCLAVIAN INNOMINATE UNI L MOD SED  03/31/2021   IR ANGIO VERTEBRAL SEL VERTEBRAL UNI R MOD SED  03/31/2021   IR CT HEAD LTD  12/13/2020   IR INTRAVSC STENT CERV CAROTID W/EMB-PROT MOD SED INCL ANGIO  12/14/2020   IR INTRAVSC STENT CERV CAROTID  W/EMB-PROT MOD SED INCL ANGIO  03/31/2021   IR PERCUTANEOUS ART THROMBECTOMY/INFUSION INTRACRANIAL INC DIAG ANGIO  12/13/2020   IR US GUIDE VASC ACCESS RIGHT  12/13/2020   IR US GUIDE VASC ACCESS RIGHT  03/31/2021   LAPAROSCOPIC RIGHT HEMI COLECTOMY N/A 01/25/2021   Procedure: LAPAROSCOPIC RIGHT COLECTOMY;  Surgeon: Ralene Ok, MD;  [facility];  Service: General;  Laterality: N/A;   RADIOLOGY WITH ANESTHESIA N/A 12/13/2020   Procedure: IR WITH ANESTHESIA;  Surgeon: Luanne Bras, MD;  Location: [facility];  Service: Radiology;  Laterality: N/A;   RADIOLOGY WITH ANESTHESIA N/A 03/31/2021   Procedure: STENTING;  Surgeon: [name], MD;  Location: [facility];  Service: Radiology;  Laterality: N/A;    There were no vitals filed for this visit.   Subjective Assessment - 08/23/21 1449     Subjective  "nothing much - about the same"    Pertinent History HTN, Polysubstance Abuse, Neuropathic Pain, L MCA CVA    Patient Stated Goals "get my arm (RUE) up like that (LUE)"    Currently in Pain? No/denies    Pain Score 0-No pain            Checked goals. See below.  Grooved Pegs with RUE - max difficulty. Used shelf liner for placing pegs for easier pick up. Difficulty with planning and manipulation of  pegs.  Standing Cane/Dowel exercises for abduction, external rotation and shoulder extension with RUE.                    OT Short Term Goals - 08/23/21 1451       OT SHORT TERM GOAL #1   Title Pt will be independent with HEP for RUE shoulder ROM, coordination and grip strength.    Time 4    Period Weeks    Status Achieved    Target Date 08/01/21      OT SHORT TERM GOAL #2   Title Pt will report decreased pain in RUE shoulder while completing bathing or driving of 1/65 or less.    Baseline 8/10 pain in RUE shoulder during these tasks    Time 4    Period Weeks    Status Achieved   5/10 pain reported with driving and bathing on 07/14/21, 07/26/21      OT SHORT TERM GOAL #3   Title Pt will increase range of motion of shoulder flexion in RUE to 100* or better with pain no greater than 7/10 for increasing ability to reach in cabinets.    Baseline 85* RUE shoulder flexion, pain 8/10    Time 4    Period Weeks    Status Achieved   105* with 5/10 pain 08/23/21     OT SHORT TERM GOAL #4   Title Pt wil verbalize understanding of adapted strategies for increasing safety and independence with ADLs and IADLs (chopping vegetables, peeling potatoes, etc)    Time 4    Period Weeks    Status Achieved      OT SHORT TERM GOAL #5   Title Pt will verbalize understanding of sensory strategies for RUE.    Time 4    Period Weeks    Status Achieved               OT Long Term Goals - 08/23/21 1452       OT LONG TERM GOAL #1   Title Pt will be independent with any updated HEPs    Time 8    Period Weeks    Status On-going      OT LONG TERM GOAL #2   Title Pt will increase range of motion in RUE shoulder flexion to 110* for increasing ability to obtain lightweight object from mid level shelf with decreased pain.    Baseline 85* RUE shoulder flex    Time 8    Period Weeks    Status On-going   105*     OT LONG TERM GOAL #3   Title Pt will increase abduction range of motion in RUE to 90* or greater for increasing ability to wash under arm and decrease pain in RUE.    Baseline 70*    Time 8    Period Weeks    Status On-going   75* 08/23/21     OT LONG TERM GOAL #4   Title Pt will increase grip strength in RUE to 35 lbs or greater for increasing dominant hand strength.    Baseline R 28.8 lbs    Time 8    Period Weeks    Status Achieved   35.4 lbs with RUE 08/18/21     OT LONG TERM GOAL #5   Title Pt will complete 9 hole peg test with RUE in 2 minutes or less    Baseline 5 pegs in 2 minutes with RUE    Time 8  Period Weeks    Status On-going   6 pegs in 2 minutes 08/18/21                  Plan - 08/23/21 1456      Clinical Impression Statement Recertification for period 07/04/21 - 08/23/21. Pt has met all STGs and has met 1/5 LTGs. Pt continues to progress towards unmet goals. Skilled occupational therapy is recommended to continue to target RUE shoulder range of motion, grip strength and coordination and motor planning.    OT Occupational Profile and History Problem Focused Assessment - Including review of records relating to presenting problem    Occupational performance deficits (Please refer to evaluation for details): IADL's;ADL's;Work;Leisure    Body Structure / Function / Physical Skills IADL;ROM;ADL;Decreased knowledge of use of DME;Strength;Pain;GMC;Sensation;UE functional use;Flexibility;Mobility;FMC    Cognitive Skills Attention;Safety Awareness    Rehab Potential Good    Clinical Decision Making Limited treatment options, no task modification necessary    Comorbidities Affecting Occupational Performance: None    Modification or Assistance to Complete Evaluation  No modification of tasks or assist necessary to complete eval    OT Frequency Other (comment)   1x/week for one week, 2x/week for 3 weeks   OT Duration 6 weeks   7 visits over 6 weeks   OT Treatment/Interventions Self-care/ADL training;Electrical Stimulation;Ultrasound;Moist Heat;Aquatic Therapy;Fluidtherapy;DME and/or AE instruction;Therapeutic activities;Therapeutic exercise;Neuromuscular education;Manual Therapy;Patient/family education;Passive range of motion;Functional Mobility Training;Cognitive remediation/compensation    Plan continue towards unmet goals, RUE shoulder range of motion and coordination    OT Home Exercise Plan issued brown/tan foam grip for handwriting    Consulted and Agree with Plan of Care Patient             Patient will benefit from skilled therapeutic intervention in order to improve the following deficits and impairments:   Body Structure / Function / Physical Skills: IADL, ROM, ADL, Decreased knowledge of  use of DME, Strength, Pain, GMC, Sensation, UE functional use, Flexibility, Mobility, [facility] Cognitive Skills: Attention, Safety Awareness     Visit Diagnosis: Other disturbances of skin sensation  Muscle weakness (generalized)  Chronic right shoulder pain  Other lack of coordination    Problem List Patient Active Problem List   Diagnosis Date Noted   Stenosis of both internal carotid arteries 04/01/2021   Carotid arterial disease ([place]) 03/31/2021   Goals of care, counseling/discussion 03/02/2021   Adenocarcinoma of colon ([place]) 01/28/2021   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

## 2021-08-25 ENCOUNTER — Ambulatory Visit: Payer: Commercial Managed Care - PPO | Admitting: Occupational Therapy

## 2021-08-25 ENCOUNTER — Other Ambulatory Visit: Payer: Self-pay

## 2021-08-25 ENCOUNTER — Encounter: Payer: Self-pay | Admitting: Occupational Therapy

## 2021-08-25 DIAGNOSIS — R208 Other disturbances of skin sensation: Secondary | ICD-10-CM | POA: Diagnosis not present

## 2021-08-25 DIAGNOSIS — G8929 Other chronic pain: Secondary | ICD-10-CM

## 2021-08-25 DIAGNOSIS — R278 Other lack of coordination: Secondary | ICD-10-CM

## 2021-08-25 DIAGNOSIS — M6281 Muscle weakness (generalized): Secondary | ICD-10-CM

## 2021-08-25 NOTE — Therapy (Signed)
[facility] [address] [place], [place], [address] Phone: [phone]   Fax:  [phone]  Occupational Therapy Treatment  Patient Details  Name: Dustin Golden MRN: [id number] Date of Birth: June 17, 1956 Referring Provider (OT): Leeroy Cha, MD   Encounter Date: 08/25/2021   OT End of Session - 08/25/21 1709     Visit Number 14    Number of Visits 20    Date for OT Re-Evaluation 10/04/21    Authorization Type [employer] Medicare    Authorization Time Period VL: 20 Hard Max    Authorization - Number of Visits 20    OT Start Time 1530    OT Stop Time 1615    OT Time Calculation (min) 45 min    Activity Tolerance Patient tolerated treatment well    Behavior During Therapy [facility] for tasks assessed/performed             Past Medical History:  Diagnosis Date   Goals of care, counseling/discussion 03/02/2021   HLD (hyperlipidemia)    Hypertension    Stroke [facility])     Past Surgical History:  Procedure Laterality Date   APPENDECTOMY     BIOPSY  01/20/2021   Procedure: BIOPSY;  Surgeon: Arta Silence, MD;  [facility]: [facility];  Service: Endoscopy;;   COLONOSCOPY WITH PROPOFOL Left 01/20/2021   Procedure: COLONOSCOPY WITH PROPOFOL;  Surgeon: Arta Silence, MD;  [facility];  Service: Endoscopy;  Laterality: Left;   ESOPHAGOGASTRODUODENOSCOPY N/A 01/19/2021   Procedure: ESOPHAGOGASTRODUODENOSCOPY (EGD);  Surgeon: Arta Silence, MD;  Location[facility];  Service: Endoscopy;  Laterality: N/A;   gastric ulcer surgery     IR ANGIO INTRA EXTRACRAN SEL COM CAROTID INNOMINATE BILAT MOD SED  03/31/2021   IR ANGIO VERTEBRAL SEL SUBCLAVIAN INNOMINATE UNI L MOD SED  03/31/2021   IR ANGIO VERTEBRAL SEL VERTEBRAL UNI R MOD SED  03/31/2021   IR CT HEAD LTD  12/13/2020   IR INTRAVSC STENT CERV CAROTID W/EMB-PROT MOD SED INCL ANGIO  12/14/2020   IR INTRAVSC STENT CERV CAROTID W/EMB-PROT MOD SED INCL ANGIO  03/31/2021   IR  PERCUTANEOUS ART THROMBECTOMY/INFUSION INTRACRANIAL INC DIAG ANGIO  12/13/2020   IR US GUIDE VASC ACCESS RIGHT  12/13/2020   IR US GUIDE VASC ACCESS RIGHT  03/31/2021   LAPAROSCOPIC RIGHT HEMI COLECTOMY N/A 01/25/2021   Procedure: LAPAROSCOPIC RIGHT COLECTOMY;  Surgeon: Ralene Ok, MD;  [facility];  Service: General;  Laterality: N/A;   RADIOLOGY WITH ANESTHESIA N/A 12/13/2020   Procedure: IR WITH ANESTHESIA;  Surgeon: Luanne Bras, MD;  Location: [place];  Service: Radiology;  Laterality: N/A;   RADIOLOGY WITH ANESTHESIA N/A 03/31/2021   Procedure: STENTING;  Surgeon: [name], MD;  Location: [place];  Service: Radiology;  Laterality: N/A;    There were no vitals filed for this visit.   Subjective Assessment - 08/25/21 1532     Subjective  It's a little better - able to reach across to washright side of body now    Pertinent History HTN, Polysubstance Abuse, Neuropathic Pain, L MCA CVA    Patient Stated Goals "get my arm (RUE) up like that (LUE)"    Currently in Pain? No/denies    Pain Score 0-No pain                          OT Treatments/Exercises (OP) - 08/25/21 0001       Neurological Re-education Exercises  Other Exercises 1 Neuromuscular reeducation to address reach pattern in RUE.  Patient continues with initiating reach pattern with shoulder elevation. Patient with very strong pattern needing manual assistance to drop humeral head with reach pattern.  Worked with body on arm motion in sidelying, patient with very limited hip and pelvis/rib cage movement.  Followed with guided reach patterns in flexion and abduction and horizontal adduction.    Other Exercises 2 Patient reports increased hypersensitivity in right hand - discussed importance of using vision to help him compensate for sensory changes in right hand.  eg- to manage amount of pressure.                      OT Short Term Goals - 08/25/21 1722       OT  SHORT TERM GOAL #1   Title Pt will be independent with HEP for RUE shoulder ROM, coordination and grip strength.    Time 4    Period Weeks    Status Achieved    Target Date 08/01/21      OT SHORT TERM GOAL #2   Title Pt will report decreased pain in RUE shoulder while completing bathing or driving of 2/42 or less.    Baseline 8/10 pain in RUE shoulder during these tasks    Time 4    Period Weeks    Status Achieved   5/10 pain reported with driving and bathing on 07/14/21, 07/26/21     OT SHORT TERM GOAL #3   Title Pt will increase range of motion of shoulder flexion in RUE to 100* or better with pain no greater than 7/10 for increasing ability to reach in cabinets.    Baseline 85* RUE shoulder flexion, pain 8/10    Time 4    Period Weeks    Status Achieved   105* with 5/10 pain 08/23/21     OT SHORT TERM GOAL #4   Title Pt wil verbalize understanding of adapted strategies for increasing safety and independence with ADLs and IADLs (chopping vegetables, peeling potatoes, etc)    Time 4    Period Weeks    Status Achieved      OT SHORT TERM GOAL #5   Title Pt will verbalize understanding of sensory strategies for RUE.    Time 4    Period Weeks    Status Achieved               OT Long Term Goals - 08/25/21 1722       OT LONG TERM GOAL #1   Title Pt will be independent with any updated HEPs    Time 8    Period Weeks    Status On-going      OT LONG TERM GOAL #2   Title Pt will increase range of motion in RUE shoulder flexion to 110* for increasing ability to obtain lightweight object from mid level shelf with decreased pain.    Baseline 85* RUE shoulder flex    Time 8    Period Weeks    Status On-going   105*     OT LONG TERM GOAL #3   Title Pt will increase abduction range of motion in RUE to 90* or greater for increasing ability to wash under arm and decrease pain in RUE.    Baseline 70*    Time 8    Period Weeks    Status On-going   75* 08/23/21     OT LONG  TERM GOAL #  4   Title Pt will increase grip strength in RUE to 35 lbs or greater for increasing dominant hand strength.    Baseline R 28.8 lbs    Time 8    Period Weeks    Status Achieved   35.4 lbs with RUE 08/18/21     OT LONG TERM GOAL #5   Title Pt will complete 9 hole peg test with RUE in 2 minutes or less    Baseline 5 pegs in 2 minutes with RUE    Time 8    Period Weeks    Status On-going   6 pegs in 2 minutes 08/18/21                  Plan - 08/25/21 1710     Clinical Impression Statement Patient continues to benefit from OT - he is reporting improved function in RUE - although still limited by improper mechanics of reach patterns, and somatosensory changes in Right hand    OT Occupational Profile and History Problem Focused Assessment - Including review of records relating to presenting problem    Occupational performance deficits (Please refer to evaluation for details): IADL's;ADL's;Work;Leisure    Body Structure / Function / Physical Skills IADL;ROM;ADL;Decreased knowledge of use of DME;Strength;Pain;GMC;Sensation;UE functional use;Flexibility;Mobility;FMC    Cognitive Skills Attention;Safety Awareness    Rehab Potential Good    Clinical Decision Making Limited treatment options, no task modification necessary    Comorbidities Affecting Occupational Performance: None    Modification or Assistance to Complete Evaluation  No modification of tasks or assist necessary to complete eval    OT Frequency Other (comment)   1x/week for one week, 2x/week for 3 weeks   OT Duration 6 weeks   7 visits over 6 weeks   OT Treatment/Interventions Self-care/ADL training;Electrical Stimulation;Ultrasound;Moist Heat;Aquatic Therapy;Fluidtherapy;DME and/or AE instruction;Therapeutic activities;Therapeutic exercise;Neuromuscular education;Manual Therapy;Patient/family education;Passive range of motion;Functional Mobility Training;Cognitive remediation/compensation    Plan continue towards  unmet goals, RUE shoulder range of motion and coordination    OT Home Exercise Plan issued brown/tan foam grip for handwriting    Consulted and Agree with Plan of Care Patient             Patient will benefit from skilled therapeutic intervention in order to improve the following deficits and impairments:   Body Structure / Function / Physical Skills: IADL, ROM, ADL, Decreased knowledge of use of DME, Strength, Pain, GMC, Sensation, UE functional use, Flexibility, Mobility, [facility] Cognitive Skills: Attention, Safety Awareness     Visit Diagnosis: Other disturbances of skin sensation  Muscle weakness (generalized)  Chronic right shoulder pain  Other lack of coordination    Problem List Patient Active Problem List   Diagnosis Date Noted   Stenosis of both internal carotid arteries 04/01/2021   Carotid arterial disease ([place]) 03/31/2021   Goals of care, counseling/discussion 03/02/2021   Adenocarcinoma of colon ([place]) 01/28/2021   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

## 2021-08-30 ENCOUNTER — Encounter: Payer: Commercial Managed Care - PPO | Admitting: Occupational Therapy

## 2021-09-01 ENCOUNTER — Encounter: Payer: Self-pay | Admitting: Occupational Therapy

## 2021-09-01 ENCOUNTER — Other Ambulatory Visit: Payer: Self-pay

## 2021-09-01 ENCOUNTER — Ambulatory Visit: Payer: Commercial Managed Care - PPO | Admitting: Occupational Therapy

## 2021-09-01 DIAGNOSIS — M6281 Muscle weakness (generalized): Secondary | ICD-10-CM

## 2021-09-01 DIAGNOSIS — R278 Other lack of coordination: Secondary | ICD-10-CM

## 2021-09-01 DIAGNOSIS — M25511 Pain in right shoulder: Secondary | ICD-10-CM

## 2021-09-01 DIAGNOSIS — R208 Other disturbances of skin sensation: Secondary | ICD-10-CM

## 2021-09-01 NOTE — Therapy (Signed)
[facility] [address] [place], [place], [address] Phone: [phone]   Fax:  [phone]  Occupational Therapy Treatment  Patient Details  Name: Dustin Golden MRN: [id number] Date of Birth: 01-02-1956 Referring Provider (OT): Leeroy Cha, MD   Encounter Date: 09/01/2021   OT End of Session - 09/01/21 1557     Visit Number 15    Number of Visits 20    Date for OT Re-Evaluation 10/04/21    Authorization Type [employer] Medicare    Authorization Time Period VL: 20 Hard Max    Authorization - Number of Visits 20    OT Start Time 1450    OT Stop Time 1530    OT Time Calculation (min) 40 min    Activity Tolerance Patient tolerated treatment well    Behavior During Therapy [facility] for tasks assessed/performed             Past Medical History:  Diagnosis Date   Goals of care, counseling/discussion 03/02/2021   HLD (hyperlipidemia)    Hypertension    Stroke [facility])     Past Surgical History:  Procedure Laterality Date   APPENDECTOMY     BIOPSY  01/20/2021   Procedure: BIOPSY;  Surgeon: Arta Silence, MD;  [facility];  Service: Endoscopy;;   COLONOSCOPY WITH PROPOFOL Left 01/20/2021   Procedure: COLONOSCOPY WITH PROPOFOL;  Surgeon: Arta Silence, MD;  [facility];  Service: Endoscopy;  Laterality: Left;   ESOPHAGOGASTRODUODENOSCOPY N/A 01/19/2021   Procedure: ESOPHAGOGASTRODUODENOSCOPY (EGD);  Surgeon: Arta Silence, MD;  [facility];  Service: Endoscopy;  Laterality: N/A;   gastric ulcer surgery     IR ANGIO INTRA EXTRACRAN SEL COM CAROTID INNOMINATE BILAT MOD SED  03/31/2021   IR ANGIO VERTEBRAL SEL SUBCLAVIAN INNOMINATE UNI L MOD SED  03/31/2021   IR ANGIO VERTEBRAL SEL VERTEBRAL UNI R MOD SED  03/31/2021   IR CT HEAD LTD  12/13/2020   IR INTRAVSC STENT CERV CAROTID W/EMB-PROT MOD SED INCL ANGIO  12/14/2020   IR INTRAVSC STENT CERV CAROTID W/EMB-PROT MOD SED INCL ANGIO  03/31/2021   IR  PERCUTANEOUS ART THROMBECTOMY/INFUSION INTRACRANIAL INC DIAG ANGIO  12/13/2020   IR US GUIDE VASC ACCESS RIGHT  12/13/2020   IR US GUIDE VASC ACCESS RIGHT  03/31/2021   LAPAROSCOPIC RIGHT HEMI COLECTOMY N/A 01/25/2021   Procedure: LAPAROSCOPIC RIGHT COLECTOMY;  Surgeon: Ralene Ok, MD;  [facility];  Service: General;  Laterality: N/A;   RADIOLOGY WITH ANESTHESIA N/A 12/13/2020   Procedure: IR WITH ANESTHESIA;  Surgeon: Luanne Bras, MD;  Location: [place];  Service: Radiology;  Laterality: N/A;   RADIOLOGY WITH ANESTHESIA N/A 03/31/2021   Procedure: STENTING;  Surgeon: [name], MD;  Location: [place];  Service: Radiology;  Laterality: N/A;    There were no vitals filed for this visit.   Subjective Assessment - 09/01/21 1451     Subjective  Patient indicates he wants to improve shoulder movement - reach    Pertinent History HTN, Polysubstance Abuse, Neuropathic Pain, L MCA CVA    Patient Stated Goals "get my arm (RUE) up like that (LUE)"    Currently in Pain? No/denies    Pain Score 0-No pain                          OT Treatments/Exercises (OP) - 09/01/21 0001       ADLs   Work Patient expressing frustration at  having to take his car to the shop for simple repairs/maintenance.  "I used to be able to do all that stuff"  Patient reports difficulty changing oil in his truck.  He is able to assist with lifting boxes onto pallet at work.  Boxes weight 75 lbs, and are 6 ft long, 4 feet wide and 8 inches tall.  Patient moves them with a partner.  Discussed best not to fling boxes (using momentum) over chest height to stack.  Rather encouraged patient to step up onto something to avoid high lifting repetitvely with weighted object at this time.  Patient expressed understanding.      Neurological Re-education Exercises   Other Exercises 1 Neuromuscular reeducation to address mid to high reach pattern in RUE.  Patient has excesisve activation and  tightness in right shoulder capsule, and lacks full elbow extension with mid to high reach.  Continued work on body over arm motion to improve joint capsule and incorporate whole arm stretch.  Patient initially in supine passive to active assisted reach patterns - used rolling away to stretch anterior chest wall.    Other Exercises 2 Prone on elbows to address humeral adduction and increase sustained activation in scapular stabizers.  Transitioned to 4 point - with right arm in slightly greater flexion to accomodate tightness in wrist.  Worked on alignment in right arm, weight shift and activation of right arm into surface than shifting weight to allow left hand to lift from surface - forced use RUE.  Followed with seated assisted reach patterns.                    OT Education - 09/01/21 1557     Education Details adapted strategies to stack boxes on pallets at work    [employer]) Educated Patient    Methods Explanation    Comprehension Verbalized understanding;Need further instruction              OT Short Term Goals - 09/01/21 1558       OT SHORT TERM GOAL #1   Title Pt will be independent with HEP for RUE shoulder ROM, coordination and grip strength.    Time 4    Period Weeks    Status Achieved    Target Date 08/01/21      OT SHORT TERM GOAL #2   Title Pt will report decreased pain in RUE shoulder while completing bathing or driving of 5/39 or less.    Baseline 8/10 pain in RUE shoulder during these tasks    Time 4    Period Weeks    Status Achieved   5/10 pain reported with driving and bathing on 07/14/21, 07/26/21     OT SHORT TERM GOAL #3   Title Pt will increase range of motion of shoulder flexion in RUE to 100* or better with pain no greater than 7/10 for increasing ability to reach in cabinets.    Baseline 85* RUE shoulder flexion, pain 8/10    Time 4    Period Weeks    Status Achieved   105* with 5/10 pain 08/23/21     OT SHORT TERM GOAL #4   Title Pt wil  verbalize understanding of adapted strategies for increasing safety and independence with ADLs and IADLs (chopping vegetables, peeling potatoes, etc)    Time 4    Period Weeks    Status Achieved      OT SHORT TERM GOAL #5   Title Pt will verbalize understanding of  sensory strategies for RUE.    Time 4    Period Weeks    Status Achieved               OT Long Term Goals - 09/01/21 1558       OT LONG TERM GOAL #1   Title Pt will be independent with any updated HEPs    Time 8    Period Weeks    Status On-going      OT LONG TERM GOAL #2   Title Pt will increase range of motion in RUE shoulder flexion to 110* for increasing ability to obtain lightweight object from mid level shelf with decreased pain.    Baseline 85* RUE shoulder flex    Time 8    Period Weeks    Status On-going   105*     OT LONG TERM GOAL #3   Title Pt will increase abduction range of motion in RUE to 90* or greater for increasing ability to wash under arm and decrease pain in RUE.    Baseline 70*    Time 8    Period Weeks    Status On-going   75* 08/23/21     OT LONG TERM GOAL #4   Title Pt will increase grip strength in RUE to 35 lbs or greater for increasing dominant hand strength.    Baseline R 28.8 lbs    Time 8    Period Weeks    Status Achieved   35.4 lbs with RUE 08/18/21     OT LONG TERM GOAL #5   Title Pt will complete 9 hole peg test with RUE in 2 minutes or less    Baseline 5 pegs in 2 minutes with RUE    Time 8    Period Weeks    Status On-going   6 pegs in 2 minutes 08/18/21                  Plan - 09/01/21 1558     Clinical Impression Statement Patient continues to show improvement in active reach patterns in RUE    OT Occupational Profile and History Problem Focused Assessment - Including review of records relating to presenting problem    Occupational performance deficits (Please refer to evaluation for details): IADL's;ADL's;Work;Leisure    Body Structure / Function /  Physical Skills IADL;ROM;ADL;Decreased knowledge of use of DME;Strength;Pain;GMC;Sensation;UE functional use;Flexibility;Mobility;FMC    Cognitive Skills Attention;Safety Awareness    Rehab Potential Good    Clinical Decision Making Limited treatment options, no task modification necessary    Comorbidities Affecting Occupational Performance: None    Modification or Assistance to Complete Evaluation  No modification of tasks or assist necessary to complete eval    OT Frequency Other (comment)   1x/week for one week, 2x/week for 3 weeks   OT Duration 6 weeks   7 visits over 6 weeks   OT Treatment/Interventions Self-care/ADL training;Electrical Stimulation;Ultrasound;Moist Heat;Aquatic Therapy;Fluidtherapy;DME and/or AE instruction;Therapeutic activities;Therapeutic exercise;Neuromuscular education;Manual Therapy;Patient/family education;Passive range of motion;Functional Mobility Training;Cognitive remediation/compensation    Plan continue towards unmet goals, RUE shoulder range of motion and coordination    OT Home Exercise Plan issued brown/tan foam grip for handwriting    Consulted and Agree with Plan of Care Patient             Patient will benefit from skilled therapeutic intervention in order to improve the following deficits and impairments:   Body Structure / Function / Physical Skills: IADL, ROM, ADL, Decreased knowledge  of use of DME, Strength, Pain, GMC, Sensation, UE functional use, Flexibility, Mobility, [facility] [address] Cognitive Skills: Attention, Safety Awareness     Visit Diagnosis: Chronic right shoulder pain  Other lack of coordination  Muscle weakness (generalized)  Other disturbances of skin sensation    Problem List Patient Active Problem List   Diagnosis Date Noted   Stenosis of both internal carotid arteries 04/01/2021   Carotid arterial disease ([place]) 03/31/2021   Goals of care, counseling/discussion 03/02/2021   Adenocarcinoma of colon ([place]) 01/28/2021   Polyp of  cecum with bleeding and high-grade dysplasia.  Probable cancer. 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

## 2021-09-05 ENCOUNTER — Encounter: Payer: Self-pay | Admitting: Occupational Therapy

## 2021-09-05 ENCOUNTER — Ambulatory Visit: Payer: Commercial Managed Care - PPO | Admitting: Occupational Therapy

## 2021-09-05 ENCOUNTER — Other Ambulatory Visit: Payer: Self-pay

## 2021-09-05 DIAGNOSIS — R278 Other lack of coordination: Secondary | ICD-10-CM

## 2021-09-05 DIAGNOSIS — M6281 Muscle weakness (generalized): Secondary | ICD-10-CM

## 2021-09-05 DIAGNOSIS — M25511 Pain in right shoulder: Secondary | ICD-10-CM

## 2021-09-05 DIAGNOSIS — G8929 Other chronic pain: Secondary | ICD-10-CM

## 2021-09-05 DIAGNOSIS — R208 Other disturbances of skin sensation: Secondary | ICD-10-CM | POA: Diagnosis not present

## 2021-09-05 NOTE — Therapy (Signed)
[facility] [address] [place], [place], [address] Phone: [phone]   Fax:  [phone]  Occupational Therapy Treatment  Patient Details  Name: Dustin Golden MRN: [id number] Date of Birth: December 05, 1955 Referring Provider (OT): Leeroy Cha, MD   Encounter Date: 09/05/2021   OT End of Session - 09/05/21 1536     Visit Number 16    Number of Visits 20    Date for OT Re-Evaluation 10/04/21    Authorization Type [employer] Medicare    Authorization Time Period VL: 20 Hard Max    Authorization - Number of Visits 20    OT Start Time 3976    OT Stop Time 1615    OT Time Calculation (min) 43 min    Activity Tolerance Patient tolerated treatment well    Behavior During Therapy [facility] for tasks assessed/performed             Past Medical History:  Diagnosis Date   Goals of care, counseling/discussion 03/02/2021   HLD (hyperlipidemia)    Hypertension    Stroke [facility])     Past Surgical History:  Procedure Laterality Date   APPENDECTOMY     BIOPSY  01/20/2021   Procedure: BIOPSY;  Surgeon: Arta Silence, MD;  [facility]: [facility];  Service: Endoscopy;;   COLONOSCOPY WITH PROPOFOL Left 01/20/2021   Procedure: COLONOSCOPY WITH PROPOFOL;  Surgeon: Arta Silence, MD;  [facility];  Service: Endoscopy;  Laterality: Left;   ESOPHAGOGASTRODUODENOSCOPY N/A 01/19/2021   Procedure: ESOPHAGOGASTRODUODENOSCOPY (EGD);  Surgeon: Arta Silence, MD;  Location: [facility];  Service: Endoscopy;  Laterality: N/A;   gastric ulcer surgery     IR ANGIO INTRA EXTRACRAN SEL COM CAROTID INNOMINATE BILAT MOD SED  03/31/2021   IR ANGIO VERTEBRAL SEL SUBCLAVIAN INNOMINATE UNI L MOD SED  03/31/2021   IR ANGIO VERTEBRAL SEL VERTEBRAL UNI R MOD SED  03/31/2021   IR CT HEAD LTD  12/13/2020   IR INTRAVSC STENT CERV CAROTID W/EMB-PROT MOD SED INCL ANGIO  12/14/2020   IR INTRAVSC STENT CERV CAROTID W/EMB-PROT MOD SED INCL ANGIO  03/31/2021   IR  PERCUTANEOUS ART THROMBECTOMY/INFUSION INTRACRANIAL INC DIAG ANGIO  12/13/2020   IR US GUIDE VASC ACCESS RIGHT  12/13/2020   IR US GUIDE VASC ACCESS RIGHT  03/31/2021   LAPAROSCOPIC RIGHT HEMI COLECTOMY N/A 01/25/2021   Procedure: LAPAROSCOPIC RIGHT COLECTOMY;  Surgeon: Ralene Ok, MD;  [facility];  Service: General;  Laterality: N/A;   RADIOLOGY WITH ANESTHESIA N/A 12/13/2020   Procedure: IR WITH ANESTHESIA;  Surgeon: Luanne Bras, MD;  Location: [place];  Service: Radiology;  Laterality: N/A;   RADIOLOGY WITH ANESTHESIA N/A 03/31/2021   Procedure: STENTING;  Surgeon: [name], MD;  Location: [facility];  Service: Radiology;  Laterality: N/A;    There were no vitals filed for this visit.   Subjective Assessment - 09/05/21 1535     Subjective  Pt reports it's challenging to get RUE up and writing and eating with RUE.    Pertinent History HTN, Polysubstance Abuse, Neuropathic Pain, L MCA CVA    Patient Stated Goals "get my arm (RUE) up like that (LUE)"    Currently in Pain? No/denies    Pain Score 0-No pain             Seated with physioball  to R for reaching and stretching with abduction and adduction with resistance.   Stretching to right with arm abducted.  Mid/High Level Reach  with RUE with easy grip/large pegs with mod drops at first but increased coordination with manipulation of pegs. Good reaching pattern with RUE but continues to demonstrate slight anterior/forward humeral head with reach.  Handwriting and coordination with RUE for making lower case "l's" or "loops" across page with use of brown/tan foam grip for increasing barrel size and grip.  ADLs issued another foam grip for handwriting and feeding. Pt reports difficulty with these tasks. Pt is currently using plastic utensils for increased ability to manipulate. Pt with poor motor planning and pattern with bringing utensil to mouth and using more abduction in shoulder. Working on increasing  shoulder flexion with elbow bent for increasing ability for self feeding. Pt did well without anything in his hand and without anything in spoon with more automatic movements and patterns without alternating attention to focus on items in spoon.                 OT Short Term Goals - 09/01/21 1558       OT SHORT TERM GOAL #1   Title Pt will be independent with HEP for RUE shoulder ROM, coordination and grip strength.    Time 4    Period Weeks    Status Achieved    Target Date 08/01/21      OT SHORT TERM GOAL #2   Title Pt will report decreased pain in RUE shoulder while completing bathing or driving of 7/61 or less.    Baseline 8/10 pain in RUE shoulder during these tasks    Time 4    Period Weeks    Status Achieved   5/10 pain reported with driving and bathing on 07/14/21, 07/26/21     OT SHORT TERM GOAL #3   Title Pt will increase range of motion of shoulder flexion in RUE to 100* or better with pain no greater than 7/10 for increasing ability to reach in cabinets.    Baseline 85* RUE shoulder flexion, pain 8/10    Time 4    Period Weeks    Status Achieved   105* with 5/10 pain 08/23/21     OT SHORT TERM GOAL #4   Title Pt wil verbalize understanding of adapted strategies for increasing safety and independence with ADLs and IADLs (chopping vegetables, peeling potatoes, etc)    Time 4    Period Weeks    Status Achieved      OT SHORT TERM GOAL #5   Title Pt will verbalize understanding of sensory strategies for RUE.    Time 4    Period Weeks    Status Achieved               OT Long Term Goals - 09/01/21 1558       OT LONG TERM GOAL #1   Title Pt will be independent with any updated HEPs    Time 8    Period Weeks    Status On-going      OT LONG TERM GOAL #2   Title Pt will increase range of motion in RUE shoulder flexion to 110* for increasing ability to obtain lightweight object from mid level shelf with decreased pain.    Baseline 85* RUE shoulder  flex    Time 8    Period Weeks    Status On-going   105*     OT LONG TERM GOAL #3   Title Pt will increase abduction range of motion in RUE to 90* or greater for increasing ability to wash  under arm and decrease pain in RUE.    Baseline 70*    Time 8    Period Weeks    Status On-going   75* 08/23/21     OT LONG TERM GOAL #4   Title Pt will increase grip strength in RUE to 35 lbs or greater for increasing dominant hand strength.    Baseline R 28.8 lbs    Time 8    Period Weeks    Status Achieved   35.4 lbs with RUE 08/18/21     OT LONG TERM GOAL #5   Title Pt will complete 9 hole peg test with RUE in 2 minutes or less    Baseline 5 pegs in 2 minutes with RUE    Time 8    Period Weeks    Status On-going   6 pegs in 2 minutes 08/18/21                  Plan - 09/05/21 1551     Clinical Impression Statement Patient continues to show improvement in active reach patterns in RUE. Continues to progress towards goals.    OT Occupational Profile and History Problem Focused Assessment - Including review of records relating to presenting problem    Occupational performance deficits (Please refer to evaluation for details): IADL's;ADL's;Work;Leisure    Body Structure / Function / Physical Skills IADL;ROM;ADL;Decreased knowledge of use of DME;Strength;Pain;GMC;Sensation;UE functional use;Flexibility;Mobility;FMC    Cognitive Skills Attention;Safety Awareness    Rehab Potential Good    Clinical Decision Making Limited treatment options, no task modification necessary    Comorbidities Affecting Occupational Performance: None    Modification or Assistance to Complete Evaluation  No modification of tasks or assist necessary to complete eval    OT Frequency Other (comment)   1x/week for one week, 2x/week for 3 weeks   OT Duration 6 weeks   7 visits over 6 weeks   OT Treatment/Interventions Self-care/ADL training;Electrical Stimulation;Ultrasound;Moist Heat;Aquatic Therapy;Fluidtherapy;DME  and/or AE instruction;Therapeutic activities;Therapeutic exercise;Neuromuscular education;Manual Therapy;Patient/family education;Passive range of motion;Functional Mobility Training;Cognitive remediation/compensation    Plan continue towards unmet goals, RUE shoulder range of motion and coordination    OT Home Exercise Plan issued brown/tan foam grip for handwriting    Consulted and Agree with Plan of Care Patient             Patient will benefit from skilled therapeutic intervention in order to improve the following deficits and impairments:   Body Structure / Function / Physical Skills: IADL, ROM, ADL, Decreased knowledge of use of DME, Strength, Pain, GMC, Sensation, UE functional use, Flexibility, Mobility, [facility] Cognitive Skills: Attention, Safety Awareness     Visit Diagnosis: Chronic right shoulder pain  Other lack of coordination  Muscle weakness (generalized)  Other disturbances of skin sensation    Problem List Patient Active Problem List   Diagnosis Date Noted   Stenosis of both internal carotid arteries 04/01/2021   Carotid arterial disease ([place]) 03/31/2021   Goals of care, counseling/discussion 03/02/2021   Adenocarcinoma of colon ([place]) 01/28/2021   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

## 2021-09-07 ENCOUNTER — Other Ambulatory Visit: Payer: Self-pay

## 2021-09-07 ENCOUNTER — Ambulatory Visit: Payer: Commercial Managed Care - PPO | Admitting: Occupational Therapy

## 2021-09-07 ENCOUNTER — Encounter: Payer: Self-pay | Admitting: Occupational Therapy

## 2021-09-07 DIAGNOSIS — R208 Other disturbances of skin sensation: Secondary | ICD-10-CM

## 2021-09-07 DIAGNOSIS — R278 Other lack of coordination: Secondary | ICD-10-CM

## 2021-09-07 DIAGNOSIS — G8929 Other chronic pain: Secondary | ICD-10-CM

## 2021-09-07 DIAGNOSIS — M6281 Muscle weakness (generalized): Secondary | ICD-10-CM

## 2021-09-07 DIAGNOSIS — M25511 Pain in right shoulder: Secondary | ICD-10-CM

## 2021-09-07 NOTE — Therapy (Signed)
[place] [address] [place], [place], [address] Phone: [phone]   Fax:  [phone]  Occupational Therapy Treatment  Patient Details  Name: Dustin Golden MRN: [id number] Date of Birth: [birth year] Referring Provider (OT): Leeroy Cha, MD   Encounter Date: 09/07/2021   OT End of Session - 09/07/21 1629     Visit Number 17    Number of Visits 20    Date for OT Re-Evaluation 10/04/21    Authorization Type [employer]HC Medicare    Authorization Time Period VL: 20 Hard Max    Authorization - Number of Visits 20    OT Start Time 1530    OT Stop Time 1615    OT Time Calculation (min) 45 min    Activity Tolerance Patient tolerated treatment well    Behavior During Therapy [facility] for tasks assessed/performed             Past Medical History:  Diagnosis Date   Goals of care, counseling/discussion 03/02/2021   HLD (hyperlipidemia)    Hypertension    Stroke [facility])     Past Surgical History:  Procedure Laterality Date   APPENDECTOMY     BIOPSY  01/20/2021   Procedure: BIOPSY;  Surgeon: Arta Silence, MD;  [facility];  Service: Endoscopy;;   COLONOSCOPY WITH PROPOFOL Left 01/20/2021   Procedure: COLONOSCOPY WITH PROPOFOL;  Surgeon: Arta Silence, MD;  [facility];  Service: Endoscopy;  Laterality: Left;   ESOPHAGOGASTRODUODENOSCOPY N/A 01/19/2021   Procedure: ESOPHAGOGASTRODUODENOSCOPY (EGD);  Surgeon: Arta Silence, MD;  [facility];  Service: Endoscopy;  Laterality: N/A;   gastric ulcer surgery     IR ANGIO INTRA EXTRACRAN SEL COM CAROTID INNOMINATE BILAT MOD SED  03/31/2021   IR ANGIO VERTEBRAL SEL SUBCLAVIAN INNOMINATE UNI L MOD SED  03/31/2021   IR ANGIO VERTEBRAL SEL VERTEBRAL UNI R MOD SED  03/31/2021   IR CT HEAD LTD  12/13/2020   IR INTRAVSC STENT CERV CAROTID W/EMB-PROT MOD SED INCL ANGIO  12/14/2020   IR INTRAVSC STENT CERV CAROTID W/EMB-PROT MOD SED INCL ANGIO  03/31/2021   IR  PERCUTANEOUS ART THROMBECTOMY/INFUSION INTRACRANIAL INC DIAG ANGIO  12/13/2020   IR US GUIDE VASC ACCESS RIGHT  12/13/2020   IR US GUIDE VASC ACCESS RIGHT  03/31/2021   LAPAROSCOPIC RIGHT HEMI COLECTOMY N/A 01/25/2021   Procedure: LAPAROSCOPIC RIGHT COLECTOMY;  Surgeon: Ralene Ok, MD;  [facility];  Service: General;  Laterality: N/A;   RADIOLOGY WITH ANESTHESIA N/A 12/13/2020   Procedure: IR WITH ANESTHESIA;  Surgeon: Luanne Bras, MD;  Location: [facility];  Service: Radiology;  Laterality: N/A;   RADIOLOGY WITH ANESTHESIA N/A 03/31/2021   Procedure: STENTING;  Surgeon: [name], MD;  Location: [facility];  Service: Radiology;  Laterality: N/A;    There were no vitals filed for this visit.   Subjective Assessment - 09/07/21 1622     Subjective  Patient reports no pain - limited by control (functional use of RUE)    Pertinent History HTN, Polysubstance Abuse, Neuropathic Pain, L MCA CVA    Patient Stated Goals "get my arm (RUE) up like that (LUE)"    Currently in Pain? No/denies    Pain Score 0-No pain                          OT Treatments/Exercises (OP) - 09/07/21 0001       ADLs   Eating Used coban  to wrap utensil for better grip.  Patient able to hold spoon, perform scooping, and bring to mouth.  Patient reports if I do not concentrate, I spill.  Discussed need for overt attention to task while relearning movement patterns for function.    Writing Worked on handwriting - first with warm up activity.  From approach of pen on table to orienting pen in hand.  Patient writes with a large marker at work.  Worked with a marker on a Hydrographic surveyor.  Used coban wrap to create tacky surface for fingers to contact.  Overt cueing to shape of hand for writing - index on top for tripod approach.  Patient completed simulated work related Building surveyor.  Patient issued small amount of coban for use on utensils at home. Also worked on approach and shape of hand  for functional use of scissors.    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  Patient excited to have assistance with physical aspects of work, but that he can direct the work.                      OT Short Term Goals - 09/07/21 1631       OT SHORT TERM GOAL #1   Title Pt will be independent with HEP for RUE shoulder ROM, coordination and grip strength.    Time 4    Period Weeks    Status Achieved    Target Date 08/01/21      OT SHORT TERM GOAL #2   Title Pt will report decreased pain in RUE shoulder while completing bathing or driving of 6/14 or less.    Baseline 8/10 pain in RUE shoulder during these tasks    Time 4    Period Weeks    Status Achieved   5/10 pain reported with driving and bathing on 07/14/21, 07/26/21     OT SHORT TERM GOAL #3   Title Pt will increase range of motion of shoulder flexion in RUE to 100* or better with pain no greater than 7/10 for increasing ability to reach in cabinets.    Baseline 85* RUE shoulder flexion, pain 8/10    Time 4    Period Weeks    Status Achieved   105* with 5/10 pain 08/23/21     OT SHORT TERM GOAL #4   Title Pt wil verbalize understanding of adapted strategies for increasing safety and independence with ADLs and IADLs (chopping vegetables, peeling potatoes, etc)    Time 4    Period Weeks    Status Achieved      OT SHORT TERM GOAL #5   Title Pt will verbalize understanding of sensory strategies for RUE.    Time 4    Period Weeks    Status Achieved               OT Long Term Goals - 09/07/21 1631       OT LONG TERM GOAL #1   Title Pt will be independent with any updated HEPs    Time 8    Period Weeks    Status On-going      OT LONG TERM GOAL #2   Title Pt will increase range of motion in RUE shoulder flexion to 110* for increasing ability to obtain lightweight object from mid level shelf with decreased pain.    Baseline  85* RUE shoulder flex  Time 8    Period Weeks    Status On-going   105*     OT LONG TERM GOAL #3   Title Pt will increase abduction range of motion in RUE to 90* or greater for increasing ability to wash under arm and decrease pain in RUE.    Baseline 70*    Time 8    Period Weeks    Status On-going   75* 08/23/21     OT LONG TERM GOAL #4   Title Pt will increase grip strength in RUE to 35 lbs or greater for increasing dominant hand strength.    Baseline R 28.8 lbs    Time 8    Period Weeks    Status Achieved   35.4 lbs with RUE 08/18/21     OT LONG TERM GOAL #5   Title Pt will complete 9 hole peg test with RUE in 2 minutes or less    Baseline 5 pegs in 2 minutes with RUE    Time 8    Period Weeks    Status On-going   6 pegs in 2 minutes 08/18/21                  Plan - 09/07/21 1629     Clinical Impression Statement Patient reports improved functional use of RUE,  Patient apraxic, and becomes frustrated with inconsistency of performance.    OT Occupational Profile and History Problem Focused Assessment - Including review of records relating to presenting problem    Occupational performance deficits (Please refer to evaluation for details): IADL's;ADL's;Work;Leisure    Body Structure / Function / Physical Skills IADL;ROM;ADL;Decreased knowledge of use of DME;Strength;Pain;GMC;Sensation;UE functional use;Flexibility;Mobility;FMC    Cognitive Skills Attention;Safety Awareness    Rehab Potential Good    Clinical Decision Making Limited treatment options, no task modification necessary    Comorbidities Affecting Occupational Performance: None    Modification or Assistance to Complete Evaluation  No modification of tasks or assist necessary to complete eval    OT Frequency Other (comment)   1x/week for one week, 2x/week for 3 weeks   OT Duration 6 weeks   7 visits over 6 weeks   OT Treatment/Interventions Self-care/ADL training;Electrical Stimulation;Ultrasound;Moist  Heat;Aquatic Therapy;Fluidtherapy;DME and/or AE instruction;Therapeutic activities;Therapeutic exercise;Neuromuscular education;Manual Therapy;Patient/family education;Passive range of motion;Functional Mobility Training;Cognitive remediation/compensation    Plan continue towards unmet goals, RUE shoulder range of motion and coordination    OT Home Exercise Plan issued brown/tan foam grip for handwriting, issued coban    Consulted and Agree with Plan of Care Patient             Patient will benefit from skilled therapeutic intervention in order to improve the following deficits and impairments:   Body Structure / Function / Physical Skills: IADL, ROM, ADL, Decreased knowledge of use of DME, Strength, Pain, GMC, Sensation, UE functional use, Flexibility, Mobility, [facility] Cognitive Skills: Attention, Safety Awareness     Visit Diagnosis: Chronic right shoulder pain  Other lack of coordination  Muscle weakness (generalized)  Other disturbances of skin sensation    Problem List Patient Active Problem List   Diagnosis Date Noted   Stenosis of both internal carotid arteries 04/01/2021   Carotid arterial disease ([place]) 03/31/2021   Goals of care, counseling/discussion 03/02/2021   Adenocarcinoma of colon ([place]) 01/28/2021   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) 12/13/2020   Polysubstance (excluding opioids) dependence ([place])     Mariah Milling, OT/L 09/07/2021, 4:31 PM  [place] [address] [place], [place], [address] Phone: [phone]   Fax:  [phone]  Name:  Dustin Golden MRN: [id number] Date of Birth: [birth year]

## 2021-09-13 ENCOUNTER — Ambulatory Visit: Payer: Commercial Managed Care - PPO | Admitting: Occupational Therapy

## 2021-09-13 ENCOUNTER — Encounter: Payer: Self-pay | Admitting: Occupational Therapy

## 2021-09-13 ENCOUNTER — Other Ambulatory Visit: Payer: Self-pay

## 2021-09-13 DIAGNOSIS — R278 Other lack of coordination: Secondary | ICD-10-CM

## 2021-09-13 DIAGNOSIS — R208 Other disturbances of skin sensation: Secondary | ICD-10-CM

## 2021-09-13 DIAGNOSIS — G8929 Other chronic pain: Secondary | ICD-10-CM

## 2021-09-13 DIAGNOSIS — M6281 Muscle weakness (generalized): Secondary | ICD-10-CM

## 2021-09-13 NOTE — Therapy (Signed)
[facility] [address] [place], [place], [address] Phone: [phone]   Fax:  [phone]  Occupational Therapy Treatment  Patient Details  Name: Dustin Golden MRN: [id number] Date of Birth: 1956-07-08 Referring Provider (OT): Leeroy Cha, MD   Encounter Date: 09/13/2021   OT End of Session - 09/13/21 1417     OT Start Time 1315    OT Stop Time 1400    OT Time Calculation (min) 45 min    Activity Tolerance Patient tolerated treatment well    Behavior During Therapy [facility] for tasks assessed/performed             Past Medical History:  Diagnosis Date   Goals of care, counseling/discussion 03/02/2021   HLD (hyperlipidemia)    Hypertension    Stroke [facility])     Past Surgical History:  Procedure Laterality Date   APPENDECTOMY     BIOPSY  01/20/2021   Procedure: BIOPSY;  Surgeon: Arta Silence, MD;  Location: [facility] ENDOSCOPY;  Service: Endoscopy;;   COLONOSCOPY WITH PROPOFOL Left 01/20/2021   Procedure: COLONOSCOPY WITH PROPOFOL;  Surgeon: Arta Silence, MD;  Location: [facility] ENDOSCOPY;  Service: Endoscopy;  Laterality: Left;   ESOPHAGOGASTRODUODENOSCOPY N/A 01/19/2021   Procedure: ESOPHAGOGASTRODUODENOSCOPY (EGD);  Surgeon: Arta Silence, MD;  Location: [facility];  Service: Endoscopy;  Laterality: N/A;   gastric ulcer surgery     IR ANGIO INTRA EXTRACRAN SEL COM CAROTID INNOMINATE BILAT MOD SED  03/31/2021   IR ANGIO VERTEBRAL SEL SUBCLAVIAN INNOMINATE UNI L MOD SED  03/31/2021   IR ANGIO VERTEBRAL SEL VERTEBRAL UNI R MOD SED  03/31/2021   IR CT HEAD LTD  12/13/2020   IR INTRAVSC STENT CERV CAROTID W/EMB-PROT MOD SED INCL ANGIO  12/14/2020   IR INTRAVSC STENT CERV CAROTID W/EMB-PROT MOD SED INCL ANGIO  03/31/2021   IR PERCUTANEOUS ART THROMBECTOMY/INFUSION INTRACRANIAL INC DIAG ANGIO  12/13/2020   IR US GUIDE VASC ACCESS RIGHT  12/13/2020   IR US GUIDE VASC ACCESS RIGHT  03/31/2021   LAPAROSCOPIC RIGHT HEMI COLECTOMY  N/A 01/25/2021   Procedure: LAPAROSCOPIC RIGHT COLECTOMY;  Surgeon: Ralene Ok, MD;  Location: [facility] ORS;  Service: General;  Laterality: N/A;   RADIOLOGY WITH ANESTHESIA N/A 12/13/2020   Procedure: IR WITH ANESTHESIA;  Surgeon: Luanne Bras, MD;  Location: [facility];  Service: Radiology;  Laterality: N/A;   RADIOLOGY WITH ANESTHESIA N/A 03/31/2021   Procedure: STENTING;  Surgeon: [name], MD;  Location: [facility];  Service: Radiology;  Laterality: N/A;    There were no vitals filed for this visit.   Subjective Assessment - 09/13/21 1328     Subjective  Patient reports feeling proud to be able to change brake pads.    Pertinent History HTN, Polysubstance Abuse, Neuropathic Pain, L MCA CVA    Patient Stated Goals "get my arm (RUE) up like that (LUE)"    Currently in Pain? No/denies    Pain Score 0-No pain                          OT Treatments/Exercises (OP) - 09/13/21 0001       ADLs   Eating Patient reports able to feed himself better now with right hand - using it more often.  He reports he did not need to add coban to handle to hold effectively.  Patient reports that drinking was difficult with right hand, but is going to try again before next session.  Also worked on using right hand as dominant in opening various cylindrical containers.    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  Patient reportss he is attempting to peel potatoes but he holds knife in right hand against potato and then changes position of potato.  Worked on peeling - shaping hand to knife or peeler, and using visual reference to help with effectiveness.  Patient was safe in performance of this task - although movements very slow and deliberate.    Work able to shape and orient hand to task.  Patient continues with directional challenges.  Patient reports he was able to change brake pads on daughter's car.  Had difficulty motor planning to get down onto floor  for task.                    OT Education - 09/13/21 1416     Education Details apraxia - benefit of continued practice of familiar tasks for functional use of RUE    Person(s) Educated Patient    Methods Explanation;Demonstration    Comprehension Returned demonstration;Verbalized understanding;Need further instruction              OT Short Term Goals - 09/13/21 1418       OT SHORT TERM GOAL #1   Title Pt will be independent with HEP for RUE shoulder ROM, coordination and grip strength.    Time 4    Period Weeks    Status Achieved    Target Date 08/01/21      OT SHORT TERM GOAL #2   Title Pt will report decreased pain in RUE shoulder while completing bathing or driving of 8/46 or less.    Baseline 8/10 pain in RUE shoulder during these tasks    Time 4    Period Weeks    Status Achieved   5/10 pain reported with driving and bathing on 07/14/21, 07/26/21     OT SHORT TERM GOAL #3   Title Pt will increase range of motion of shoulder flexion in RUE to 100* or better with pain no greater than 7/10 for increasing ability to reach in cabinets.    Baseline 85* RUE shoulder flexion, pain 8/10    Time 4    Period Weeks    Status Achieved   105* with 5/10 pain 08/23/21     OT SHORT TERM GOAL #4   Title Pt wil verbalize understanding of adapted strategies for increasing safety and independence with ADLs and IADLs (chopping vegetables, peeling potatoes, etc)    Time 4    Period Weeks    Status Achieved      OT SHORT TERM GOAL #5   Title Pt will verbalize understanding of sensory strategies for RUE.    Time 4    Period Weeks    Status Achieved               OT Long Term Goals - 09/13/21 1418       OT LONG TERM GOAL #1   Title Pt will be independent with any updated HEPs    Time 8    Period Weeks    Status On-going      OT LONG TERM GOAL #2   Title Pt will increase range of motion in RUE shoulder flexion to 110* for increasing ability to obtain  lightweight object from mid level shelf with decreased pain.    Baseline 85* RUE shoulder flex    Time 8  Period Weeks    Status On-going   105*     OT LONG TERM GOAL #3   Title Pt will increase abduction range of motion in RUE to 90* or greater for increasing ability to wash under arm and decrease pain in RUE.    Baseline 70*    Time 8    Period Weeks    Status On-going   75* 08/23/21     OT LONG TERM GOAL #4   Title Pt will increase grip strength in RUE to 35 lbs or greater for increasing dominant hand strength.    Baseline R 28.8 lbs    Time 8    Period Weeks    Status Achieved   35.4 lbs with RUE 08/18/21     OT LONG TERM GOAL #5   Title Pt will complete 9 hole peg test with RUE in 2 minutes or less    Baseline 5 pegs in 2 minutes with RUE    Time 8    Period Weeks    Status On-going   6 pegs in 2 minutes 08/18/21                  Plan - 09/13/21 1417     Clinical Impression Statement Patient continues to report improved use of RUE - now using more for eating, and cooking.    OT Occupational Profile and History Problem Focused Assessment - Including review of records relating to presenting problem    Occupational performance deficits (Please refer to evaluation for details): IADL's;ADL's;Work;Leisure    Body Structure / Function / Physical Skills IADL;ROM;ADL;Decreased knowledge of use of DME;Strength;Pain;GMC;Sensation;UE functional use;Flexibility;Mobility;FMC    Cognitive Skills Attention;Safety Awareness    Rehab Potential Good    Clinical Decision Making Limited treatment options, no task modification necessary    Comorbidities Affecting Occupational Performance: None    Modification or Assistance to Complete Evaluation  No modification of tasks or assist necessary to complete eval    OT Frequency Other (comment)   1x/week for one week, 2x/week for 3 weeks   OT Duration 6 weeks   7 visits over 6 weeks   OT Treatment/Interventions Self-care/ADL  training;Electrical Stimulation;Ultrasound;Moist Heat;Aquatic Therapy;Fluidtherapy;DME and/or AE instruction;Therapeutic activities;Therapeutic exercise;Neuromuscular education;Manual Therapy;Patient/family education;Passive range of motion;Functional Mobility Training;Cognitive remediation/compensation    Plan 2 visits remaining - continue towards unmet goals, RUE shoulder range of motion and coordination    OT Home Exercise Plan issued brown/tan foam grip for handwriting, issued coban    Consulted and Agree with Plan of Care Patient             Patient will benefit from skilled therapeutic intervention in order to improve the following deficits and impairments:   Body Structure / Function / Physical Skills: IADL, ROM, ADL, Decreased knowledge of use of DME, Strength, Pain, GMC, Sensation, UE functional use, Flexibility, Mobility, [facility] Cognitive Skills: Attention, Safety Awareness     Visit Diagnosis: Chronic right shoulder pain  Other lack of coordination  Muscle weakness (generalized)  Other disturbances of skin sensation    Problem List Patient Active Problem List   Diagnosis Date Noted   Stenosis of both internal carotid arteries 04/01/2021   Carotid arterial disease ([place]) 03/31/2021   Goals of care, counseling/discussion 03/02/2021   Adenocarcinoma of colon ([place]) 01/28/2021   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 [place], [place], [address] Phone: [phone]   Fax:  [phone]  Name: JAFET WISSING MRN: [id number] Date of Birth: Oct 27, 1955

## 2021-09-15 ENCOUNTER — Encounter: Payer: Self-pay | Admitting: Occupational Therapy

## 2021-09-15 ENCOUNTER — Ambulatory Visit: Payer: Commercial Managed Care - PPO | Admitting: Occupational Therapy

## 2021-09-15 DIAGNOSIS — G8929 Other chronic pain: Secondary | ICD-10-CM

## 2021-09-15 DIAGNOSIS — R278 Other lack of coordination: Secondary | ICD-10-CM

## 2021-09-15 DIAGNOSIS — R208 Other disturbances of skin sensation: Secondary | ICD-10-CM

## 2021-09-15 NOTE — Therapy (Signed)
[facility] [address] [place], [place], [address] Phone: [phone]   Fax:  [phone]  September 15, 2021    No Recipients  Occupational Therapy Discharge Summary   Patient: Dustin Golden MRN: [id number] Date of Birth: 05-Dec-1955  Diagnosis: Chronic right shoulder pain  Other lack of coordination  Other disturbances of skin sensation  Referring Provider (OT): Leeroy Cha, MD   The above patient had been seen in Occupational Therapy 18 times  The treatment consisted of Neuromuscular reeducation and training functional use of RUE The patient is: Improved  Subjective: Patient showing consistent improved functional use of dominant RUE    Functional Status at Discharge: Patient has been working at least 4 hours/day. Patient showing improved functional use with RUE with hand tools and implements.    Goals Partially Met    OT Long Term Goals - 09/13/21 1418       OT LONG TERM GOAL #1   Title Pt will be independent with any updated HEPs    Time 8    Period Weeks     Achieved     OT LONG TERM GOAL #2   Title Pt will increase range of motion in RUE shoulder flexion to 110* for increasing ability to obtain lightweight object from mid level shelf with decreased pain.    Baseline 85* RUE shoulder flex    Time 8    Period Weeks    Status Partially met   105*     OT LONG TERM GOAL #3   Title Pt will increase abduction range of motion in RUE to 90* or greater for increasing ability to wash under arm and decrease pain in RUE.    Baseline 70*    Time 8    Period Weeks    Status Not met  75* 08/23/21     OT LONG TERM GOAL #4   Title Pt will increase grip strength in RUE to 35 lbs or greater for increasing dominant hand strength.    Baseline R 28.8 lbs    Time 8    Period Weeks    Status Achieved   35.4 lbs with RUE 08/18/21     OT LONG TERM GOAL #5   Title Pt will complete 9 hole peg test with RUE in 2  minutes or less    Baseline 5 pegs in 2 minutes with RUE    Time 8    Period Weeks    Status Not met   6 pegs in 2 minutes 08/18/21             Sincerely,  Jamara Vary, Algernon Huxley, OT  CC No Recipients  [facility] [address] [employer] [place], [place], [address] Phone: [phone]   Fax:  [phone]  Patient: Dustin Golden MRN: [id number] Date of Birth: 1956-09-02

## 2021-09-19 ENCOUNTER — Encounter
Payer: Commercial Managed Care - PPO | Attending: Physical Medicine and Rehabilitation | Admitting: Physical Medicine and Rehabilitation

## 2021-09-19 DIAGNOSIS — M79641 Pain in right hand: Secondary | ICD-10-CM | POA: Insufficient documentation

## 2021-09-19 DIAGNOSIS — M7502 Adhesive capsulitis of left shoulder: Secondary | ICD-10-CM | POA: Insufficient documentation

## 2021-09-19 DIAGNOSIS — I63512 Cerebral infarction due to unspecified occlusion or stenosis of left middle cerebral artery: Secondary | ICD-10-CM | POA: Insufficient documentation

## 2021-09-19 DIAGNOSIS — M79604 Pain in right leg: Secondary | ICD-10-CM | POA: Insufficient documentation

## 2021-09-19 DIAGNOSIS — M7501 Adhesive capsulitis of right shoulder: Secondary | ICD-10-CM | POA: Insufficient documentation

## 2021-10-27 ENCOUNTER — Inpatient Hospital Stay: Payer: Commercial Managed Care - PPO

## 2021-10-27 ENCOUNTER — Inpatient Hospital Stay: Payer: Commercial Managed Care - PPO | Admitting: Hematology & Oncology

## 2021-11-07 ENCOUNTER — Ambulatory Visit (HOSPITAL_BASED_OUTPATIENT_CLINIC_OR_DEPARTMENT_OTHER)
Admission: RE | Admit: 2021-11-07 | Discharge: 2021-11-07 | Disposition: A | Discharge status: Home / Self Care | Payer: Commercial Managed Care - PPO | Source: Ambulatory Visit | Attending: Hematology & Oncology | Admitting: Hematology & Oncology

## 2021-11-07 ENCOUNTER — Other Ambulatory Visit: Payer: Self-pay

## 2021-11-07 ENCOUNTER — Encounter: Payer: Self-pay | Admitting: *Deleted

## 2021-11-07 DIAGNOSIS — C189 Malignant neoplasm of colon, unspecified: Secondary | ICD-10-CM | POA: Insufficient documentation

## 2021-11-28 ENCOUNTER — Other Ambulatory Visit: Payer: Self-pay

## 2021-11-28 ENCOUNTER — Inpatient Hospital Stay: Payer: Commercial Managed Care - PPO | Attending: Hematology & Oncology

## 2021-11-28 ENCOUNTER — Inpatient Hospital Stay (HOSPITAL_BASED_OUTPATIENT_CLINIC_OR_DEPARTMENT_OTHER): Payer: Commercial Managed Care - PPO | Admitting: Hematology & Oncology

## 2021-11-28 ENCOUNTER — Encounter: Payer: Self-pay | Admitting: Hematology & Oncology

## 2021-11-28 VITALS — BP 118/78 | HR 72 | Temp 98.4°F | Resp 18 | Wt 205.0 lb

## 2021-11-28 DIAGNOSIS — Z79899 Other long term (current) drug therapy: Secondary | ICD-10-CM | POA: Diagnosis not present

## 2021-11-28 DIAGNOSIS — Z9049 Acquired absence of other specified parts of digestive tract: Secondary | ICD-10-CM | POA: Diagnosis not present

## 2021-11-28 DIAGNOSIS — C189 Malignant neoplasm of colon, unspecified: Secondary | ICD-10-CM | POA: Diagnosis not present

## 2021-11-28 DIAGNOSIS — C182 Malignant neoplasm of ascending colon: Secondary | ICD-10-CM | POA: Insufficient documentation

## 2021-11-28 DIAGNOSIS — R531 Weakness: Secondary | ICD-10-CM | POA: Diagnosis not present

## 2021-11-28 DIAGNOSIS — Z886 Allergy status to analgesic agent status: Secondary | ICD-10-CM | POA: Diagnosis not present

## 2021-11-28 DIAGNOSIS — D573 Sickle-cell trait: Secondary | ICD-10-CM | POA: Insufficient documentation

## 2021-11-28 DIAGNOSIS — Z882 Allergy status to sulfonamides status: Secondary | ICD-10-CM | POA: Diagnosis not present

## 2021-11-28 LAB — CBC WITH DIFFERENTIAL (CANCER CENTER ONLY)
Abs Immature Granulocytes: 0 10*3/uL (ref 0.00–0.07)
Basophils Absolute: 0 10*3/uL (ref 0.0–0.1)
Basophils Relative: 0 %
Eosinophils Absolute: 0.1 10*3/uL (ref 0.0–0.5)
Eosinophils Relative: 2 %
HCT: 40.3 % (ref 39.0–52.0)
Hemoglobin: 13.4 g/dL (ref 13.0–17.0)
Immature Granulocytes: 0 %
Lymphocytes Relative: 54 %
Lymphs Abs: 3.7 10*3/uL (ref 0.7–4.0)
MCH: 26.1 pg (ref 26.0–34.0)
MCHC: 33.3 g/dL (ref 30.0–36.0)
MCV: 78.4 fL — ABNORMAL LOW (ref 80.0–100.0)
Monocytes Absolute: 0.4 10*3/uL (ref 0.1–1.0)
Monocytes Relative: 5 %
Neutro Abs: 2.7 10*3/uL (ref 1.7–7.7)
Neutrophils Relative %: 39 %
Platelet Count: 200 10*3/uL (ref 150–400)
RBC: 5.14 MIL/uL (ref 4.22–5.81)
RDW: 14.1 % (ref 11.5–15.5)
WBC Count: 6.8 10*3/uL (ref 4.0–10.5)
nRBC: 0 % (ref 0.0–0.2)

## 2021-11-28 LAB — CMP (CANCER CENTER ONLY)
ALT: 21 U/L (ref 0–44)
AST: 28 U/L (ref 15–41)
Albumin: 4.3 g/dL (ref 3.5–5.0)
Alkaline Phosphatase: 83 U/L (ref 38–126)
Anion gap: 8 (ref 5–15)
BUN: 15 mg/dL (ref 8–23)
CO2: 27 mmol/L (ref 22–32)
Calcium: 8.9 mg/dL (ref 8.9–10.3)
Chloride: 103 mmol/L (ref 98–111)
Creatinine: 1.13 mg/dL (ref 0.61–1.24)
GFR, Estimated: >60 mL/min (ref >60)
Glucose, Bld: 92 mg/dL (ref 70–99)
Potassium: 4.2 mmol/L (ref 3.5–5.1)
Sodium: 138 mmol/L (ref 135–145)
Total Bilirubin: 0.5 mg/dL (ref 0.3–1.2)
Total Protein: 7.4 g/dL (ref 6.5–8.1)

## 2021-11-28 LAB — LACTATE DEHYDROGENASE: LDH: 136 U/L (ref 98–192)

## 2021-11-28 NOTE — Progress Notes (Signed)
Hematology and Oncology Follow Up Visit  Dustin Golden [id number] 1955/10/30 66 y.o. 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 daily  Baby aspirin/Brilinta/Lipitor Status post right hemicolectomy on 01/25/2021     Interim History:  Dustin Golden is back for follow-up.  The last time that we saw him was back in September.  Since then, he has been doing okay.  He had a CT scan done a week or so ago.  The CT scan did not show any evidence of recurrent colon cancer.  He is still doing some rehabilitation for the stroke that he had.  He still has a weakness on the right side.  He says this is getting better.  His last CEA back in September was normal at 1.02.  He has had no problems with bowels or bladder.  He has had no cough or shortness of breath.  Has been no nausea or vomiting.  He has had no leg swelling.  There is been no chest discomfort.  Overall, I would say that his performance status is probably ECOG 1.    Medications:  Current Outpatient Medications:    acetaminophen (TYLENOL) 325 MG tablet, Take 2 tablets (650 mg total) by mouth every 4 (four) hours as needed for mild pain (or temp > 37.5 C (99.5 F)). (Patient taking differently: Take 1,000 mg by mouth every 4 (four) hours as needed for mild pain (or temp > 37.5 C (99.5 F)).), Disp: , Rfl:    aspirin 81 MG chewable tablet, Chew 1 tablet (81 mg total) by mouth daily., Disp: , Rfl:    atorvastatin (LIPITOR) 20 MG tablet, Take 1 tablet (20 mg total) by mouth daily., Disp: 90 tablet, Rfl: 3   baclofen (LIORESAL) 10 MG tablet, Take 0.5 tablets (5 mg total) by mouth 3 (three) times daily., Disp: 90 tablet, Rfl: 3   folic acid (FOLVITE) 1 MG tablet, Take 1 mg by mouth daily., Disp: , Rfl:   Allergies:  Allergies  Allergen Reactions   Ibuprofen Other (See Comments)    Nosebleeds    Sulfa Antibiotics Rash  and Other (See Comments)    "Blisters to skin with cream"   Sulfamethoxazole Rash and Other (See Comments)    "Blisters to skin with cream"    Past Medical History, Surgical history, Social history, and Family History were reviewed and updated.  Review of Systems: Review of Systems  Constitutional: Negative.   HENT:  Negative.    Eyes: Negative.   Respiratory: Negative.    Cardiovascular: Negative.   Gastrointestinal: Negative.   Endocrine: Negative.   Genitourinary: Negative.    Musculoskeletal: Negative.   Skin: Negative.   Neurological: Negative.   Hematological: Negative.   Psychiatric/Behavioral: Negative.     Physical Exam:  weight is 205 lb (93 kg). His oral temperature is 98.4 F (36.9 C). His blood pressure is 118/78 and his pulse is 72. His respiration is 18 and oxygen saturation is 99%.   Wt Readings from Last 3 Encounters:  11/28/21 205 lb (93 kg)  06/30/21 193 lb (87.5 kg)  06/17/21 192 lb 6.4 oz (87.3 kg)    Physical Exam Vitals reviewed.  HENT:     Head: Normocephalic and atraumatic.  Eyes:     Pupils: Pupils are equal, round, and reactive to light.  Cardiovascular:     Rate and Rhythm: Normal rate and  regular rhythm.     Heart sounds: Normal heart sounds.  Pulmonary:     Effort: Pulmonary effort is normal.     Breath sounds: Normal breath sounds.  Abdominal:     General: Bowel sounds are normal.     Palpations: Abdomen is soft.     Comments: His abdomen is soft.  He has good bowel sounds.  Has a well-healed laparotomy scar.  This is in the mid abdomen.  There is a second laparotomy scar in the right lower quadrant.  There is no fluid wave.  There is no guarding or rebound tenderness.  There is no palpable liver or spleen tip.  Musculoskeletal:        General: No tenderness or deformity. Normal range of motion.     Cervical back: Normal range of motion.  Lymphadenopathy:     Cervical: No cervical adenopathy.  Skin:    General: Skin is warm and  dry.     Findings: No erythema or rash.  Neurological:     Mental Status: He is alert and oriented to person, place, and time.  Psychiatric:        Behavior: Behavior normal.        Thought Content: Thought content normal.        Judgment: Judgment normal.     Lab Results  Component Value Date   WBC 6.8 11/28/2021   HGB 13.4 11/28/2021   HCT 40.3 11/28/2021   MCV 78.4 (L) 11/28/2021   PLT 200 11/28/2021     Chemistry      Component Value Date/Time   NA 138 06/30/2021 1308   K 3.7 06/30/2021 1308   CL 103 06/30/2021 1308   CO2 26 06/30/2021 1308   BUN 12 06/30/2021 1308   CREATININE 1.02 06/30/2021 1308      Component Value Date/Time   CALCIUM 9.6 06/30/2021 1308   ALKPHOS 110 06/30/2021 1308   AST 15 06/30/2021 1308   ALT 14 06/30/2021 1308   BILITOT 0.5 06/30/2021 1308      Impression and Plan: Dustin Golden is a very nice 66 year old African-American male.  He has a recent history of stage II colon cancer.  This should be good risk.  He had 28 negative lymph nodes.  I will set him up with another CT scan.  Hopefully, this might be the last with that we need to do on him.  He has stage II disease.  He had 28 lymph nodes that were negative.  This is an excellent lymph node sampling.  We will plan to get him back to see [place] after his CAT scan is done.  We will try to get him back in September.   [name], MD 2/13/20233:25 PM

## 2021-11-29 LAB — CEA (IN HOUSE-CHCC): CEA (CHCC-In House): 1.29 ng/mL (ref 0.00–5.00)

## 2021-12-01 ENCOUNTER — Other Ambulatory Visit: Payer: Self-pay

## 2021-12-01 ENCOUNTER — Encounter (HOSPITAL_BASED_OUTPATIENT_CLINIC_OR_DEPARTMENT_OTHER): Payer: Self-pay | Admitting: Urology

## 2021-12-01 ENCOUNTER — Emergency Department (HOSPITAL_BASED_OUTPATIENT_CLINIC_OR_DEPARTMENT_OTHER)
Admission: EM | Admit: 2021-12-01 | Discharge: 2021-12-01 | Disposition: A | Discharge status: Home / Self Care | Payer: Commercial Managed Care - PPO | Attending: Emergency Medicine | Admitting: Emergency Medicine

## 2021-12-01 DIAGNOSIS — S0181XA Laceration without foreign body of other part of head, initial encounter: Secondary | ICD-10-CM | POA: Diagnosis not present

## 2021-12-01 DIAGNOSIS — Z79899 Other long term (current) drug therapy: Secondary | ICD-10-CM | POA: Diagnosis not present

## 2021-12-01 DIAGNOSIS — Z7982 Long term (current) use of aspirin: Secondary | ICD-10-CM | POA: Diagnosis not present

## 2021-12-01 DIAGNOSIS — S0990XA Unspecified injury of head, initial encounter: Secondary | ICD-10-CM | POA: Diagnosis present

## 2021-12-01 DIAGNOSIS — W228XXA Striking against or struck by other objects, initial encounter: Secondary | ICD-10-CM | POA: Diagnosis not present

## 2021-12-01 MED ORDER — LIDOCAINE-EPINEPHRINE 2 %-1:200000 IJ SOLN
10.0000 mL | Freq: Once | INTRAMUSCULAR | Status: AC
Start: 2021-12-01 — End: 2021-12-01
  Administered 2021-12-01: 10 mL via INTRADERMAL
  Filled 2021-12-01: qty 20

## 2021-12-01 NOTE — ED Provider Notes (Signed)
[facility] EMERGENCY DEPARTMENT Provider Note   CSN: [id number] Arrival date & time: 12/01/21  1731     History  Chief Complaint  Patient presents with   Head Laceration    Dustin Golden is a 66 y.o. male.  The history is provided by the patient. No language interpreter was used.  Head Laceration   66 year old male with history of prior stroke presenting for evaluation of head injury.  Patient report approximately an hour ago he was using a hand truck to load up some wood and when he stooped down, he accidentally struck his head against the handle of the hand truck causing a laceration across his forehead.  He denies falling to the ground denies any loss of consciousness.  He does complain of mild pain to the affected area with associated bleeding.  He did apply a dressing and went to urgent care center who sent patient here for further assessment.  Patient is not on any other blood thinner medication he denies any neck pain he denies any precipitating symptoms prior to the injury he has no other complaint  Home Medications Prior to Admission medications   Medication Sig Start Date End Date Taking? Authorizing Provider  acetaminophen (TYLENOL) 325 MG tablet Take 2 tablets (650 mg total) by mouth every 4 (four) hours as needed for mild pain (or temp > 37.5 C (99.5 F)). Patient taking differently: Take 1,000 mg by mouth every 4 (four) hours as needed for mild pain (or temp > 37.5 C (99.5 F)). 12/17/20   Bailey-Modzik, Mayo Ao A, NP  aspirin 81 MG chewable tablet Chew 1 tablet (81 mg total) by mouth daily. 12/18/20   Bailey-Modzik, Delila A, NP  atorvastatin (LIPITOR) 20 MG tablet Take 1 tablet (20 mg total) by mouth daily. 02/15/21   Frann Rider, NP  baclofen (LIORESAL) 10 MG tablet Take 0.5 tablets (5 mg total) by mouth 3 (three) times daily. 06/17/21 06/17/22  Raulkar, Clide Deutscher, MD  folic acid (FOLVITE) 1 MG tablet Take 1 mg by mouth daily. 03/10/21   [provider]       Allergies    Ibuprofen, Sulfa antibiotics, and Sulfamethoxazole    Review of Systems   Review of Systems  All other systems reviewed and are negative.  Physical Exam Updated Vital Signs BP (!) 170/105 (BP Location: Right Arm)    Pulse 73    Temp 98.1 F (36.7 C) (Oral)    Resp 20    Ht 6\' 3"  (1.905 m)    Wt 93 kg    SpO2 97%    BMI 25.63 kg/m  Physical Exam Vitals and nursing note reviewed.  Constitutional:      General: He is not in acute distress.    Appearance: He is well-developed.  HENT:     Head: Normocephalic.   Eyes:     Conjunctiva/sclera: Conjunctivae normal.  Musculoskeletal:     Cervical back: Neck supple.  Skin:    Findings: No rash.  Neurological:     Mental Status: He is alert.    ED Results / Procedures / Treatments   Labs (all labs ordered are listed, but only abnormal results are displayed) Labs Reviewed - No data to display  EKG None  Radiology No results found.  Procedures .Marland KitchenLaceration Repair  Date/Time: 12/01/2021 6:37 PM Performed by: Domenic Moras, PA-C Authorized by: Domenic Moras, PA-C   Consent:    Consent obtained:  Verbal   Consent given by:  Patient  Risks, benefits, and alternatives were discussed: yes     Risks discussed:  Infection and vascular damage   Alternatives discussed:  Referral Universal protocol:    Patient identity confirmed:  Verbally with patient Anesthesia:    Anesthesia method:  Local infiltration   Local anesthetic:  Lidocaine 2% WITH epi Laceration details:    Location:  Face   Face location:  Forehead   Length (cm):  3   Depth (mm):  4 Pre-procedure details:    Preparation:  Patient was prepped and draped in usual sterile fashion Exploration:    Limited defect created (wound extended): no     Hemostasis achieved with:  Epinephrine   Imaging outcome: foreign body not noted     Wound exploration: wound explored through full range of motion and entire depth of wound visualized     Wound extent: no  foreign bodies/material noted, no underlying fracture noted and no vascular damage noted     Contaminated: no   Treatment:    Area cleansed with:  Povidone-iodine and saline   Amount of cleaning:  Standard   Irrigation solution:  Sterile saline   Irrigation method:  Pressure wash   Visualized foreign bodies/material removed: no     Debridement:  None Skin repair:    Repair method:  Sutures   Suture size:  5-0   Suture material:  Prolene   Suture technique:  Simple interrupted   Number of sutures:  5 Approximation:    Approximation:  Close Repair type:    Repair type:  Intermediate Post-procedure details:    Dressing:  Non-adherent dressing   Procedure completion:  Tolerated well, no immediate complications    Medications Ordered in ED Medications  lidocaine-EPINEPHrine (XYLOCAINE W/EPI) 2 %-1:200000 (PF) injection 10 mL (has no administration in time range)    ED Course/ Medical Decision Making/ A&P                           Medical Decision Making Problems Addressed: Laceration of forehead, initial encounter: acute illness or injury    Details: -lac repaired using non absorbable suture -wound care instruction provided  Risk Prescription drug management.   BP (!) 170/105 (BP Location: Right Arm)    Pulse 73    Temp 98.1 F (36.7 C) (Oral)    Resp 20    Ht 6\' 3"  (1.905 m)    Wt 93 kg    SpO2 97%    BMI 25.63 kg/m   6:13 PM Patient here for evaluation of minor head injury when the handle of a hand truck struck his forehead approximate 1 hour ago.  Patient reports that he initially applied a dressing to the affected area and went to urgent care but after the provider remove the dressing and noticed squirting blood, another dressing was applied and patient sent here for further management.  Patient is up-to-date with tetanus, he does not have any significant headache and neck pain warranting advanced imaging and he is not on any blood thinner medication.  Will perform  laceration repair.  6:37 PM I have explored the wound I did not appreciate any deep arterial bleed.  Wound was thoroughly irrigated, sutures placed, recommend suture removal in 5 days.  Wound care instruction provided.  Care discussed with Dr. Pearline Cables        Final Clinical Impression(s) / ED Diagnoses Final diagnoses:  Laceration of forehead, initial encounter    Rx / DC Orders ED  Discharge Orders     None         Domenic Moras, PA-C 42/59/56 3875    Lianne Cure, DO 64/33/29 2103

## 2021-12-01 NOTE — Discharge Instructions (Addendum)
You have been evaluated for your head injury.  Please have your sutures removed in 5 days.  Monitor for any signs of infection.  You may take over-the-counter Tylenol as needed for aches and pain.  Keep the wound dry for the first 24 hours and after that you can clean gently with soap and water.

## 2021-12-01 NOTE — ED Triage Notes (Signed)
Approx 1 hr pta Had hand truck handle hit him in the forehead  Laceration to forehead, pressure bandage in place Denies LOC  Went to UC, bleeding controlled by pressure bandage Not on blood thinners

## 2021-12-15 IMAGING — DX DG ABD PORTABLE 1V
1 series · 1 of 1 positions shown · non-contrast
Comparison: Film from earlier in the same day.

CLINICAL DATA: Check gastric catheter placement

EXAM:
PORTABLE ABDOMEN - 1 VIEW

[abdomen kub]
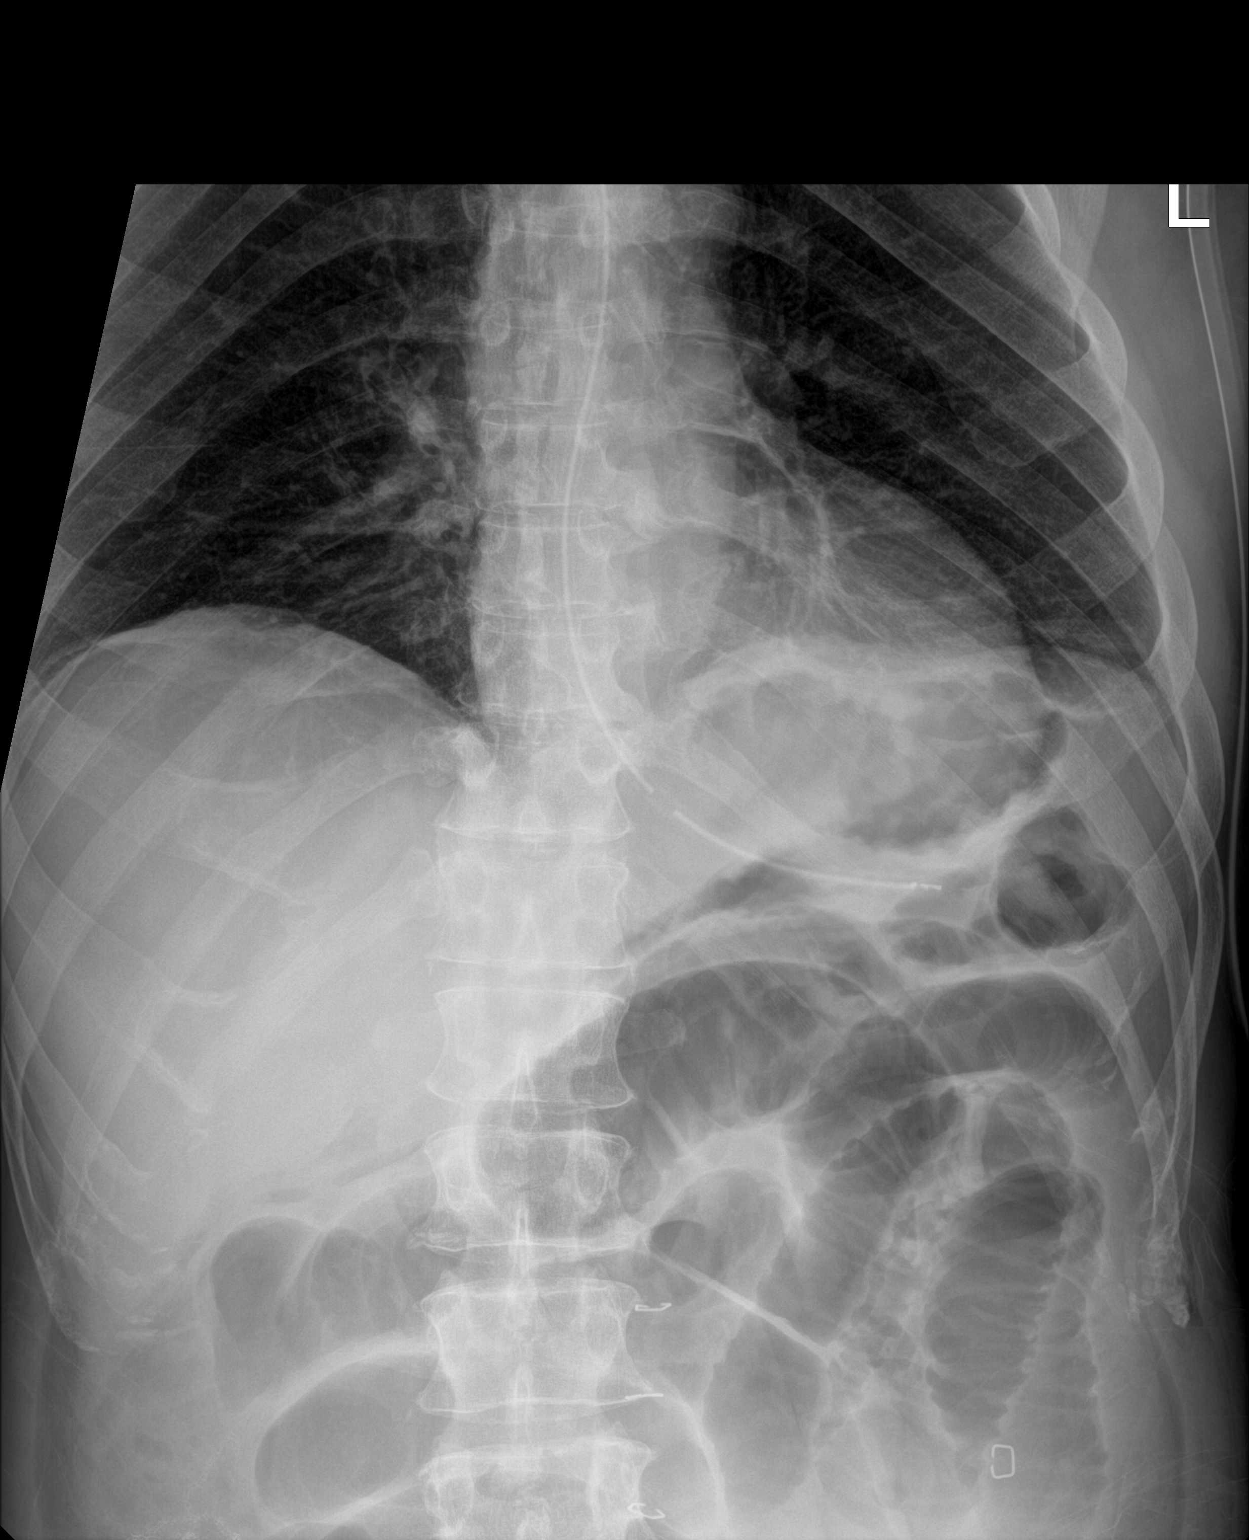

[1 of 1 positions shown; findings below may reference images not displayed]

FINDINGS: Stable small bowel dilatation is noted. Gastric catheter is now
noted within the stomach. No free air is seen.
IMPRESSION: Gastric catheter within the stomach.

## 2021-12-15 IMAGING — DX DG ABD PORTABLE 1V
2 series · 2 of 2 positions shown · non-contrast
Comparison: January 20, 2021 CT abdomen and pelvis

CLINICAL DATA: Abdominal pain and rectal bleeding

EXAM:
PORTABLE ABDOMEN - 1 VIEW

[abdomen kub (1 of 2)]
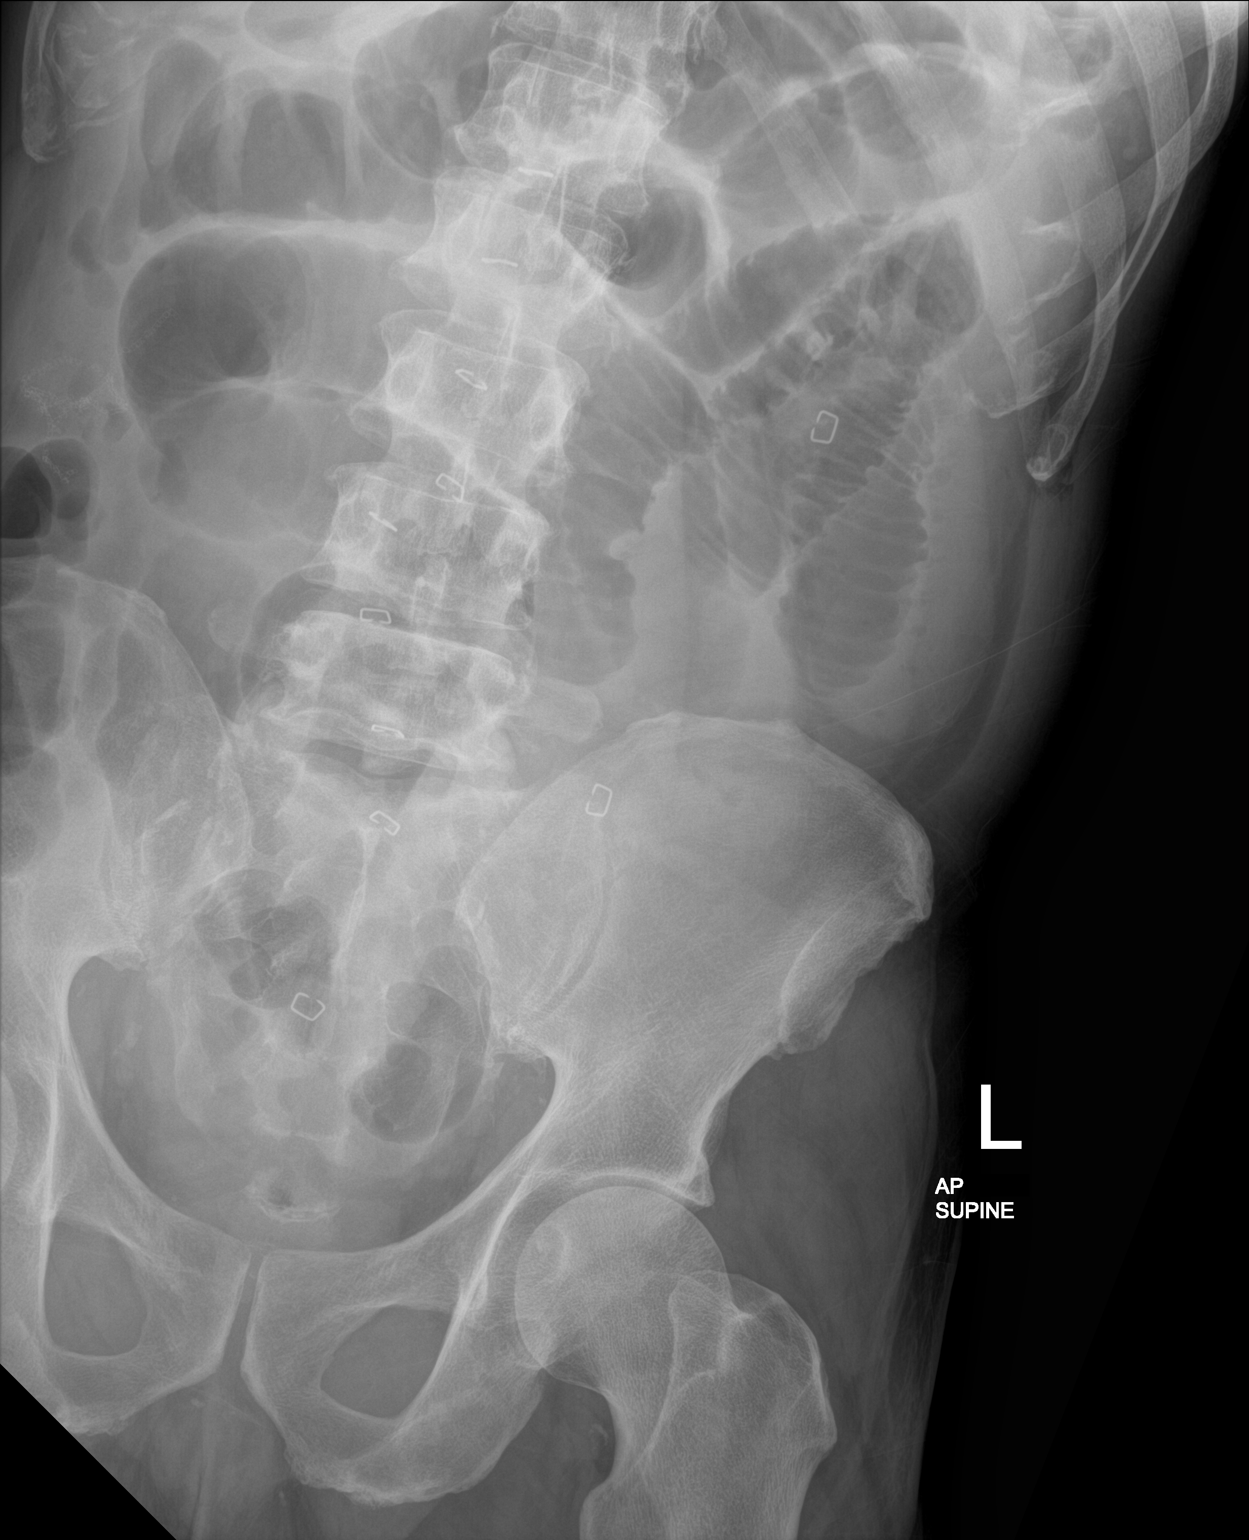

[abdomen kub (2 of 2)]
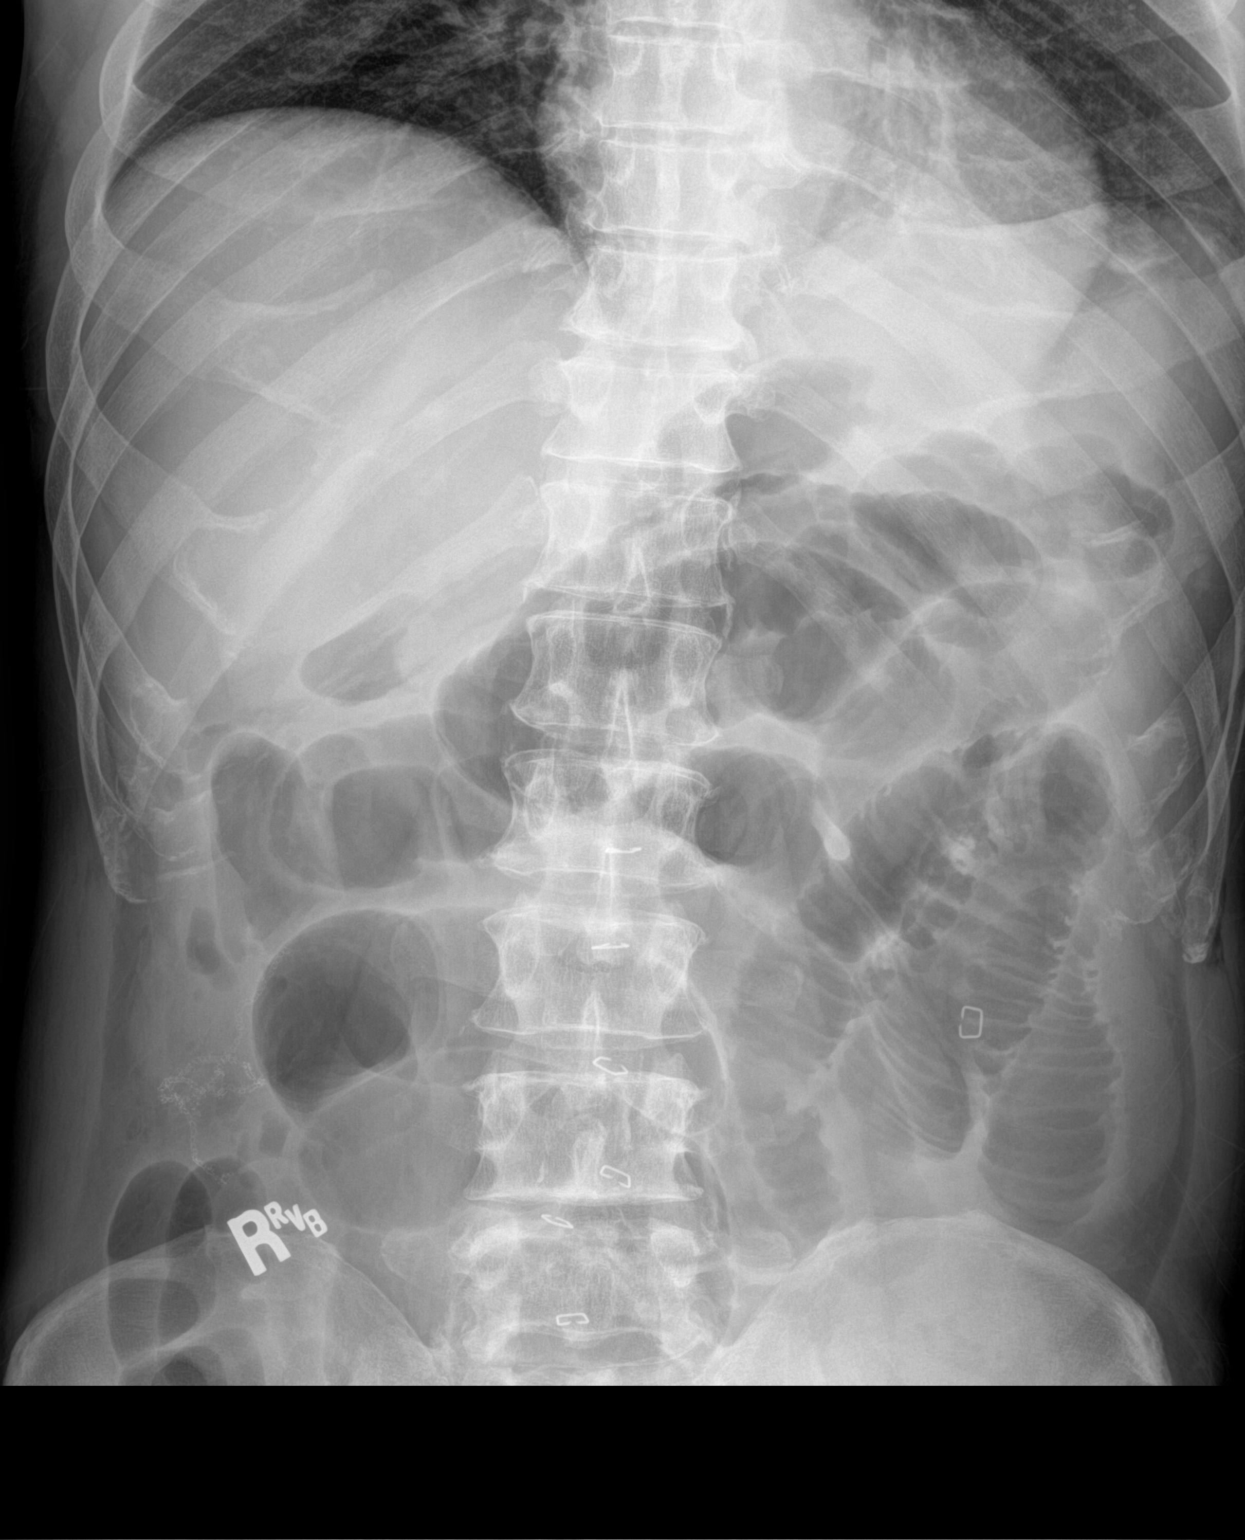

[2 of 2 positions shown; findings below may reference images not displayed]

FINDINGS: There are multiple loops of dilated small bowel without appreciable
air-fluid levels. No free air evident on supine examination. Lung
bases are clear. There are multiple skin staples.
IMPRESSION: Bowel dilatation, most likely representing postoperative ileus. A
degree of bowel obstruction cannot be excluded in this circumstance.
No free air. Lung bases clear.

## 2021-12-20 IMAGING — DX DG ABD PORTABLE 1V
1 series · 1 of 1 positions shown · non-contrast
Comparison: CT 01/30/2021.  Abdomen 01/30/2021.

CLINICAL DATA: NG tube placement.

EXAM:
PORTABLE ABDOMEN - 1 VIEW

[abdomen kub]
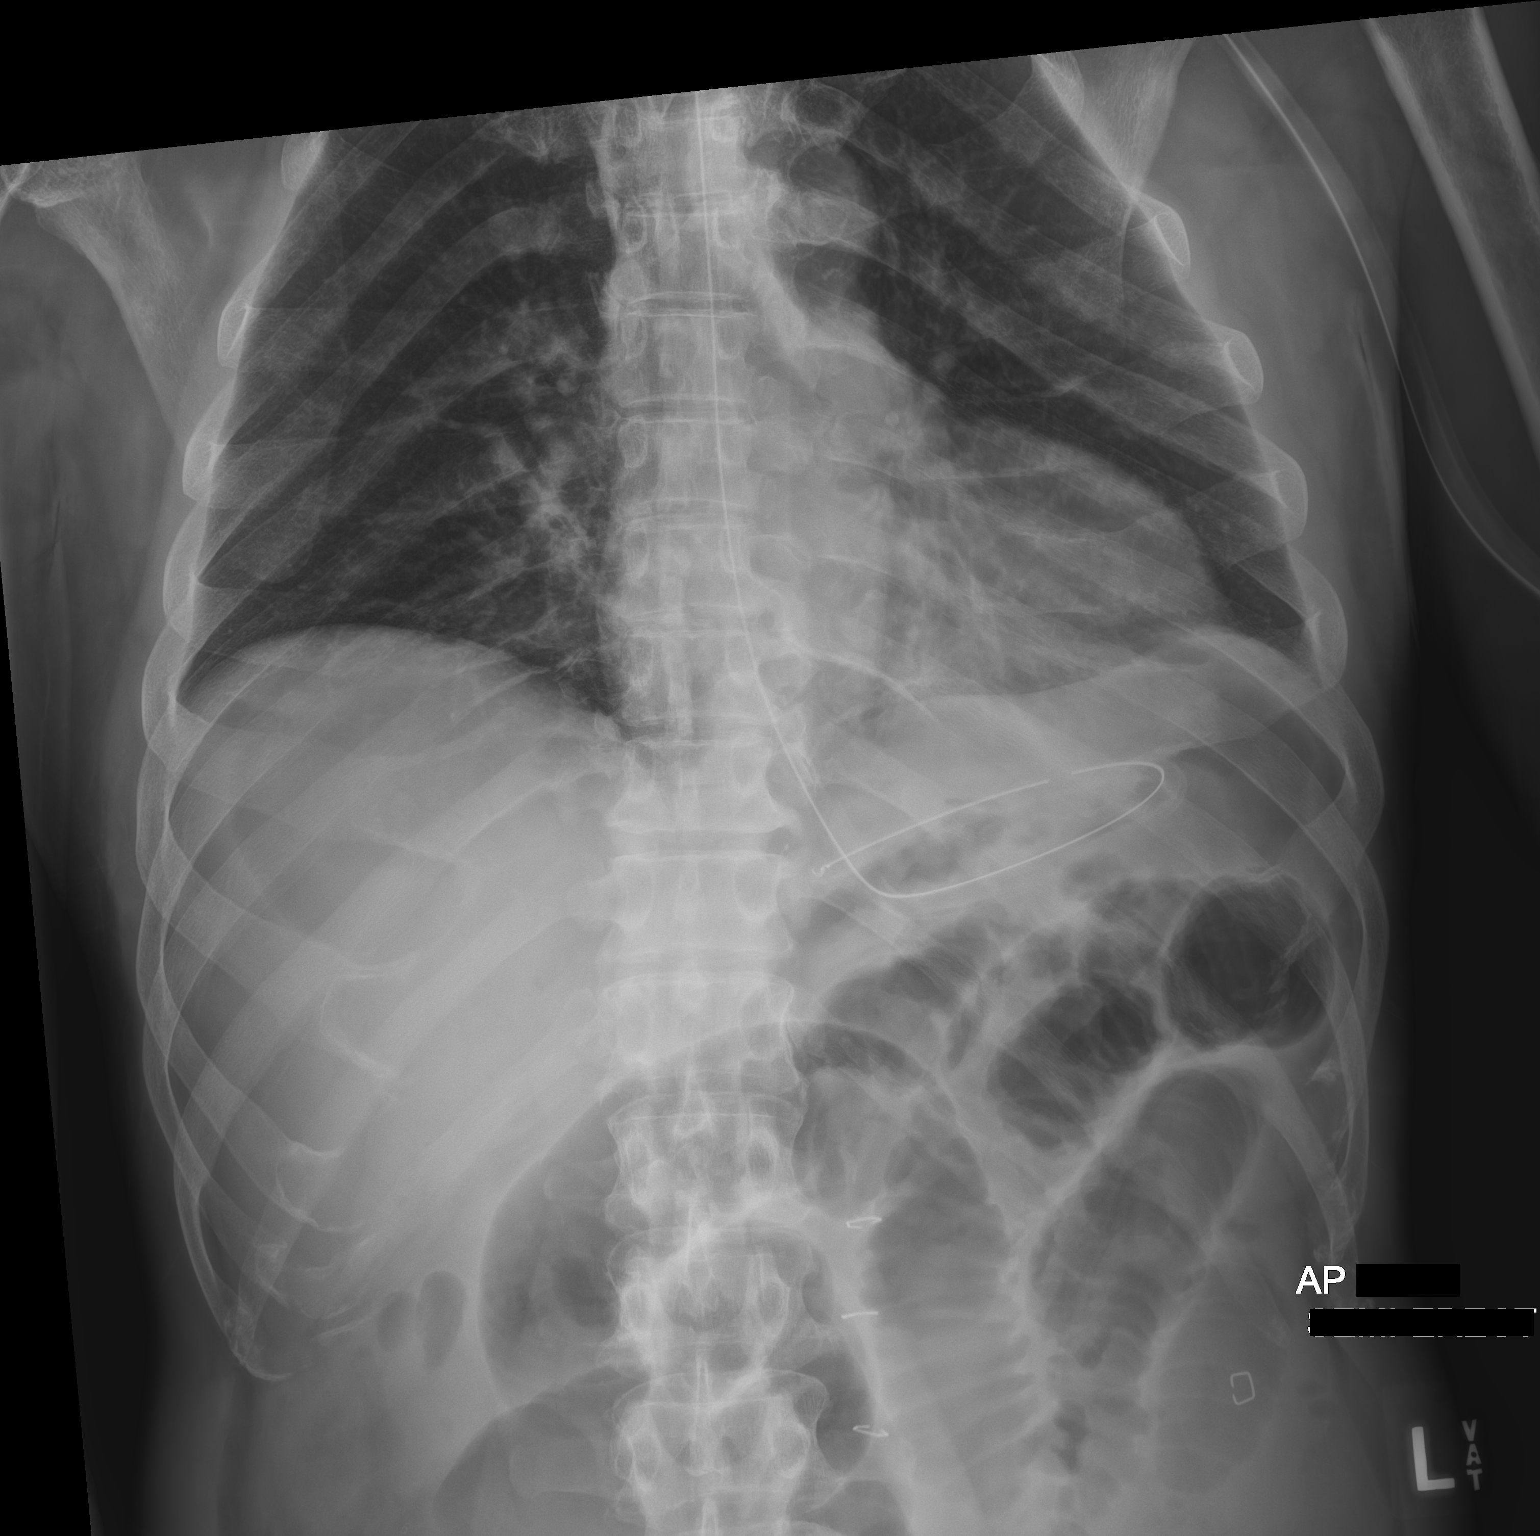

[1 of 1 positions shown; findings below may reference images not displayed]

FINDINGS: NG tube noted with tip coiled in the stomach. Distended loops of
small bowel again noted. Although ileus could present this fashion,
small-bowel obstruction cannot be excluded. Free air again noted,
most likely from prior surgery. Surgical staples noted over the
abdomen.
IMPRESSION: NG tube noted with tip coiled in stomach.

2. Distended loops of small bowel again noted. Although ileus could
present this fashion, small-bowel obstruction cannot be excluded.
Free air again noted, most likely from prior surgery

## 2022-01-04 ENCOUNTER — Telehealth (HOSPITAL_COMMUNITY): Payer: Self-pay | Admitting: Radiology

## 2022-01-04 NOTE — Telephone Encounter (Signed)
Pt called and asked if he could stop taking his blood thinner to have a tooth extraction. Per Dr. [name] the patient is only taking Aspirin '81mg'$  QD. His Brilinta was stopped in Oct. 2022. If he has to stop the Aspirin for the procedure he can, he just needs to resume it ASAP afterwards. JM ?

## 2022-06-23 ENCOUNTER — Inpatient Hospital Stay: Payer: Medicare Other

## 2022-06-23 ENCOUNTER — Inpatient Hospital Stay: Payer: Medicare Other | Admitting: Hematology & Oncology

## 2022-07-19 ENCOUNTER — Other Ambulatory Visit: Payer: Self-pay

## 2022-07-19 ENCOUNTER — Ambulatory Visit: Payer: Self-pay | Admitting: Hematology & Oncology

## 2022-07-26 ENCOUNTER — Other Ambulatory Visit (HOSPITAL_COMMUNITY): Payer: Self-pay | Admitting: Neuroradiology

## 2022-07-26 DIAGNOSIS — I6523 Occlusion and stenosis of bilateral carotid arteries: Secondary | ICD-10-CM

## 2022-07-26 DIAGNOSIS — I6300 Cerebral infarction due to thrombosis of unspecified precerebral artery: Secondary | ICD-10-CM

## 2022-08-09 ENCOUNTER — Ambulatory Visit (HOSPITAL_COMMUNITY)
Admission: RE | Admit: 2022-08-09 | Discharge: 2022-08-09 | Disposition: A | Discharge status: Home / Self Care | Payer: Medicare Other | Source: Ambulatory Visit | Attending: Neuroradiology | Admitting: Neuroradiology

## 2022-08-09 ENCOUNTER — Telehealth (HOSPITAL_COMMUNITY): Payer: Self-pay

## 2022-08-09 DIAGNOSIS — I6523 Occlusion and stenosis of bilateral carotid arteries: Secondary | ICD-10-CM | POA: Insufficient documentation

## 2022-08-09 DIAGNOSIS — I6300 Cerebral infarction due to thrombosis of unspecified precerebral artery: Secondary | ICD-10-CM | POA: Diagnosis present

## 2022-08-09 NOTE — Telephone Encounter (Signed)
Called pt regarding recent imaging, no answer, left vm. AW  

## 2022-08-17 ENCOUNTER — Other Ambulatory Visit: Payer: Self-pay | Admitting: Physical Medicine and Rehabilitation

## 2022-09-11 ENCOUNTER — Encounter: Payer: Self-pay | Admitting: Hematology & Oncology

## 2022-09-11 ENCOUNTER — Inpatient Hospital Stay: Payer: Medicare Other | Attending: Hematology & Oncology

## 2022-09-11 ENCOUNTER — Other Ambulatory Visit: Payer: Self-pay

## 2022-09-11 ENCOUNTER — Inpatient Hospital Stay (HOSPITAL_BASED_OUTPATIENT_CLINIC_OR_DEPARTMENT_OTHER): Payer: Medicare Other | Admitting: Hematology & Oncology

## 2022-09-11 VITALS — BP 147/79 | HR 83 | Temp 98.5°F | Resp 18 | Ht 75.0 in | Wt 202.0 lb

## 2022-09-11 DIAGNOSIS — D573 Sickle-cell trait: Secondary | ICD-10-CM | POA: Diagnosis present

## 2022-09-11 DIAGNOSIS — C182 Malignant neoplasm of ascending colon: Secondary | ICD-10-CM | POA: Insufficient documentation

## 2022-09-11 DIAGNOSIS — I4892 Unspecified atrial flutter: Secondary | ICD-10-CM | POA: Insufficient documentation

## 2022-09-11 DIAGNOSIS — Z882 Allergy status to sulfonamides status: Secondary | ICD-10-CM | POA: Insufficient documentation

## 2022-09-11 DIAGNOSIS — Z79899 Other long term (current) drug therapy: Secondary | ICD-10-CM | POA: Insufficient documentation

## 2022-09-11 DIAGNOSIS — Z7982 Long term (current) use of aspirin: Secondary | ICD-10-CM | POA: Insufficient documentation

## 2022-09-11 DIAGNOSIS — Z886 Allergy status to analgesic agent status: Secondary | ICD-10-CM | POA: Diagnosis not present

## 2022-09-11 DIAGNOSIS — Z8673 Personal history of transient ischemic attack (TIA), and cerebral infarction without residual deficits: Secondary | ICD-10-CM | POA: Diagnosis not present

## 2022-09-11 DIAGNOSIS — Z9049 Acquired absence of other specified parts of digestive tract: Secondary | ICD-10-CM | POA: Insufficient documentation

## 2022-09-11 DIAGNOSIS — I483 Typical atrial flutter: Secondary | ICD-10-CM

## 2022-09-11 DIAGNOSIS — C189 Malignant neoplasm of colon, unspecified: Secondary | ICD-10-CM | POA: Diagnosis not present

## 2022-09-11 LAB — CBC WITH DIFFERENTIAL (CANCER CENTER ONLY)
Abs Immature Granulocytes: 0.02 10*3/uL (ref 0.00–0.07)
Basophils Absolute: 0 10*3/uL (ref 0.0–0.1)
Basophils Relative: 1 %
Eosinophils Absolute: 0.1 10*3/uL (ref 0.0–0.5)
Eosinophils Relative: 2 %
HCT: 42.8 % (ref 39.0–52.0)
Hemoglobin: 14 g/dL (ref 13.0–17.0)
Immature Granulocytes: 0 %
Lymphocytes Relative: 45 %
Lymphs Abs: 3.6 10*3/uL (ref 0.7–4.0)
MCH: 25.9 pg — ABNORMAL LOW (ref 26.0–34.0)
MCHC: 32.7 g/dL (ref 30.0–36.0)
MCV: 79.1 fL — ABNORMAL LOW (ref 80.0–100.0)
Monocytes Absolute: 0.5 10*3/uL (ref 0.1–1.0)
Monocytes Relative: 6 %
Neutro Abs: 3.8 10*3/uL (ref 1.7–7.7)
Neutrophils Relative %: 46 %
Platelet Count: 254 10*3/uL (ref 150–400)
RBC: 5.41 MIL/uL (ref 4.22–5.81)
RDW: 14.8 % (ref 11.5–15.5)
WBC Count: 8 10*3/uL (ref 4.0–10.5)
nRBC: 0 % (ref 0.0–0.2)

## 2022-09-11 LAB — CMP (CANCER CENTER ONLY)
ALT: 17 U/L (ref 0–44)
AST: 19 U/L (ref 15–41)
Albumin: 4.9 g/dL (ref 3.5–5.0)
Alkaline Phosphatase: 99 U/L (ref 38–126)
Anion gap: 11 (ref 5–15)
BUN: 17 mg/dL (ref 8–23)
CO2: 26 mmol/L (ref 22–32)
Calcium: 10.1 mg/dL (ref 8.9–10.3)
Chloride: 102 mmol/L (ref 98–111)
Creatinine: 1.26 mg/dL — ABNORMAL HIGH (ref 0.61–1.24)
GFR, Estimated: >60 mL/min (ref >60)
Glucose, Bld: 100 mg/dL — ABNORMAL HIGH (ref 70–99)
Potassium: 4.5 mmol/L (ref 3.5–5.1)
Sodium: 139 mmol/L (ref 135–145)
Total Bilirubin: 0.4 mg/dL (ref 0.3–1.2)
Total Protein: 7.8 g/dL (ref 6.5–8.1)

## 2022-09-11 LAB — LACTATE DEHYDROGENASE: LDH: 110 U/L (ref 98–192)

## 2022-09-11 LAB — CEA (IN HOUSE-CHCC): CEA (CHCC-In House): 1.44 ng/mL (ref 0.00–5.00)

## 2022-09-11 NOTE — Progress Notes (Signed)
Hematology and Oncology Follow Up Visit  Dustin Golden [id number] 1956/08/15 66 y.o. 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 daily  Baby aspirin/Brilinta/Lipitor Status post right hemicolectomy on 01/25/2021     Interim History:  Dustin Golden is back for follow-up.  We last saw him back in February.  Since then, he has been doing pretty well.  He really has had no specific complaints.  He has had problems with his insurance company.  When we last saw him, the CEA level was 1.29.  He has had no problems with abdominal pain.  There is no change in bowel or bladder habits.  He has had no cough or shortness of breath.  He has had no fever.  He has had no leg swelling.  He is still recovering from the stroke.  I think he is still getting some rehab.  Overall, I would have to say that his performance status is ECOG 1.     Medications:  Current Outpatient Medications:    acetaminophen (TYLENOL) 325 MG tablet, Take 2 tablets (650 mg total) by mouth every 4 (four) hours as needed for mild pain (or temp > 37.5 C (99.5 F)). (Patient taking differently: Take 1,000 mg by mouth every 4 (four) hours as needed for mild pain (or temp > 37.5 C (99.5 F)).), Disp: , Rfl:    aspirin 81 MG chewable tablet, Chew 1 tablet (81 mg total) by mouth daily., Disp: , Rfl:   Allergies:  Allergies  Allergen Reactions   Ibuprofen Other (See Comments)    Nosebleeds    Sulfa Antibiotics Rash and Other (See Comments)    "Blisters to skin with cream"   Sulfamethoxazole Rash and Other (See Comments)    "Blisters to skin with cream"    Past Medical History, Surgical history, Social history, and Family History were reviewed and updated.  Review of Systems: Review of Systems  Constitutional: Negative.   HENT:  Negative.    Eyes: Negative.   Respiratory: Negative.    Cardiovascular:  Negative.   Gastrointestinal: Negative.   Endocrine: Negative.   Genitourinary: Negative.    Musculoskeletal: Negative.   Skin: Negative.   Neurological: Negative.   Hematological: Negative.   Psychiatric/Behavioral: Negative.      Physical Exam:  height is '6\' 3"'$  (1.905 m) and weight is 202 lb (91.6 kg). His oral temperature is 98.5 F (36.9 C). His blood pressure is 147/79 (abnormal) and his pulse is 83. His respiration is 18 and oxygen saturation is 100%.   Wt Readings from Last 3 Encounters:  09/11/22 202 lb (91.6 kg)  12/01/21 205 lb 0.4 oz (93 kg)  11/28/21 205 lb (93 kg)    Physical Exam Vitals reviewed.  HENT:     Head: Normocephalic and atraumatic.  Eyes:     Pupils: Pupils are equal, round, and reactive to light.  Cardiovascular:     Heart sounds: Normal heart sounds.     Comments: Cardiac exam is regular rate and irregular rhythm.  This only sounds like atrial fibrillation.  Has a normal S1-S2.  There are no obvious murmurs. Pulmonary:     Effort: Pulmonary effort is normal.     Breath sounds: Normal breath sounds.  Abdominal:     General: Bowel sounds are normal.     Palpations: Abdomen is soft.     Comments: His abdomen  is soft.  He has good bowel sounds.  Has a well-healed laparotomy scar.  This is in the mid abdomen.  There is a second laparotomy scar in the right lower quadrant.  There is no fluid wave.  There is no guarding or rebound tenderness.  There is no palpable liver or spleen tip.  Musculoskeletal:        General: No tenderness or deformity. Normal range of motion.     Cervical back: Normal range of motion.  Lymphadenopathy:     Cervical: No cervical adenopathy.  Skin:    General: Skin is warm and dry.     Findings: No erythema or rash.  Neurological:     Mental Status: He is alert and oriented to person, place, and time.  Psychiatric:        Behavior: Behavior normal.        Thought Content: Thought content normal.        Judgment: Judgment  normal.      Lab Results  Component Value Date   WBC 8.0 09/11/2022   HGB 14.0 09/11/2022   HCT 42.8 09/11/2022   MCV 79.1 (L) 09/11/2022   PLT 254 09/11/2022     Chemistry      Component Value Date/Time   NA 139 09/11/2022 1510   K 4.5 09/11/2022 1510   CL 102 09/11/2022 1510   CO2 26 09/11/2022 1510   BUN 17 09/11/2022 1510   CREATININE 1.26 (H) 09/11/2022 1510      Component Value Date/Time   CALCIUM 10.1 09/11/2022 1510   ALKPHOS 99 09/11/2022 1510   AST 19 09/11/2022 1510   ALT 17 09/11/2022 1510   BILITOT 0.4 09/11/2022 1510      Impression and Plan: Dustin Golden is a very nice 66 year old African-American male.  He has a recent history of stage II colon cancer.  This should be good risk.  He had 28 negative lymph nodes.  I did do an EKG on him.  This did show atrial flutter.  I will have to speak to with one of the cardiologist about this.  He is on baby aspirin right now.  I do not think we need any scans on him right now.  I am more worried about the cardiac status.  I will like to see him back in probably 4 months just for follow-up.  If all is good in 4 months, and we probably will need 31-monthfollow-up after that.   PVolanda Napoleon MD 11/27/20234:02 PM   ADDENDUM: I spoke with Dr. RGeraldo Pitterof Cardiology.  He will be more than happy to see Dustin Golden.  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  He will need to be on a beta-blocker and also blood thinner.  We will see about making an appointment to see Dr. RGeraldo Pitteron Friday, September 15, 2022   PLattie Haw MD

## 2022-09-13 NOTE — Addendum Note (Signed)
Addended by: Burney Gauze R on: 09/13/2022 01:52 PM   Modules accepted: Orders

## 2022-09-15 ENCOUNTER — Ambulatory Visit: Payer: Medicare Other | Attending: Cardiology | Admitting: Cardiology

## 2022-09-15 ENCOUNTER — Encounter: Payer: Self-pay | Admitting: Cardiology

## 2022-09-15 VITALS — BP 122/82 | HR 86 | Ht 75.0 in | Wt 203.0 lb

## 2022-09-15 DIAGNOSIS — C189 Malignant neoplasm of colon, unspecified: Secondary | ICD-10-CM | POA: Diagnosis present

## 2022-09-15 DIAGNOSIS — I63512 Cerebral infarction due to unspecified occlusion or stenosis of left middle cerebral artery: Secondary | ICD-10-CM

## 2022-09-15 DIAGNOSIS — I10 Essential (primary) hypertension: Secondary | ICD-10-CM | POA: Diagnosis present

## 2022-09-15 DIAGNOSIS — Z8673 Personal history of transient ischemic attack (TIA), and cerebral infarction without residual deficits: Secondary | ICD-10-CM | POA: Diagnosis present

## 2022-09-15 DIAGNOSIS — I6523 Occlusion and stenosis of bilateral carotid arteries: Secondary | ICD-10-CM | POA: Diagnosis present

## 2022-09-15 NOTE — Progress Notes (Signed)
Cardiology Office Note:    Date:  09/15/2022  ID:  Dustin Golden, DOB 1955-12-03, MRN [id number]  PCP:  [name], PA  Cardiologist:  Jenean Lindau, MD   Referring MD: [name], MD    ASSESSMENT:    1. Bilateral carotid artery stenosis   2. Left middle cerebral artery stroke ([place])   3. Essential hypertension   4. History of stroke   5. Adenocarcinoma of colon ([place])    PLAN:    In order of problems listed above:  Coronary artery calcification seen on CT scan: Secondary prevention stressed with the patient.  Importance of compliance with diet medication stressed and vocalized understanding.  Patient tells me that he is very active and has no symptoms from this.  I suggested evaluation with physiologic testing such as exercise stress Cardiolite but he is not keen on it. Carotid artery stenosis postangioplasty: I reviewed the records and suggested statin therapy but is against it.  Benefits and potential risks explained and he vocalized understanding and questions were answered to satisfaction. Paroxysmal atrial flutter: Patient has elevated CHA2DS2-VASc score.  He has coronary artery calcification, atherosclerotic vascular disease and age 64.  I recommended anticoagulation.  He is against it.  Benefits and potential risks explained to the patient.  Questions were answered to his satisfaction.  Currently is in sinus rhythm however he complains of palpitations on and off he bluntly told me that he is against any medicines to be taken on a regular basis and he was not keen on any such pursuits.  I respect his wishes.  Again I explained to him the benefits risks of all the above with extensive length and questions were answered to his satisfaction.  I will see him in follow-up appointment on a as needed basis.  I also told him to get in touch with primary care and alert them and his oncologist about my appointment with him today.  He mentioned to me that he will make them aware of  this.   Medication Adjustments/Labs and Tests Ordered: Current medicines are reviewed at length with the patient today.  Concerns regarding medicines are outlined above.  Orders Placed This Encounter  Procedures   EKG 12-Lead   No orders of the defined types were placed in this encounter.    History of Present Illness:    Dustin Golden is a 66 y.o. male who is being seen today for the evaluation of paroxysmal atrial flutter at the request of Ennever, Rudell Cobb, MD. patient is a pleasant 66 year old male.  He has past medical history of coronary artery calcifications on CT scan, carotid artery stenosis postangioplasty, he has had history of stroke.  He is also history of colon cancer and is under the monitoring of oncology.  His oncologist came to my office 2 days ago with the EKG.  He presented with variably ventricular rate.  That is the reason why he set up an appointment for him to see me today.  Patient tells me he is doing fine.  He tells me he has palpitation on and off.  No chest pain orthopnea or PND.  Past Medical History:  Diagnosis Date   Acute GI bleeding 01/19/2021   Adenocarcinoma of colon ([place]) 01/28/2021   AKI (acute kidney injury) ([place])    Carotid arterial disease ([place]) 03/31/2021   Closed displaced fracture of proximal phalanx of left little finger 06/04/2019   Essential hypertension    Eye discharge  GIB (gastrointestinal bleeding) 01/17/2021   Goals of care, counseling/discussion 03/02/2021   History of stroke 01/17/2021   Hyperlipidemia 01/17/2021   Hyponatremia    Infected hand 06/04/2019   Left middle cerebral artery stroke ([place]) 12/17/2020   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 01/22/2021   Polysubstance (excluding opioids) dependence ([place])    Staphylococcal infection 06/04/2019   Stenosis of both internal carotid arteries 04/01/2021   Stroke [facility])     Past Surgical History:  Procedure Laterality Date   APPENDECTOMY      BIOPSY  01/20/2021   Procedure: BIOPSY;  Surgeon: Arta Silence, MD;  Location: [facility];  Service: Endoscopy;;   COLONOSCOPY WITH PROPOFOL Left 01/20/2021   Procedure: COLONOSCOPY WITH PROPOFOL;  Surgeon: Arta Silence, MD;  Location: [facility];  Service: Endoscopy;  Laterality: Left;   ESOPHAGOGASTRODUODENOSCOPY N/A 01/19/2021   Procedure: ESOPHAGOGASTRODUODENOSCOPY (EGD);  Surgeon: Arta Silence, MD;  Location: [facility];  Service: Endoscopy;  Laterality: N/A;   gastric ulcer surgery     IR ANGIO INTRA EXTRACRAN SEL COM CAROTID INNOMINATE BILAT MOD SED  03/31/2021   IR ANGIO VERTEBRAL SEL SUBCLAVIAN INNOMINATE UNI L MOD SED  03/31/2021   IR ANGIO VERTEBRAL SEL VERTEBRAL UNI R MOD SED  03/31/2021   IR CT HEAD LTD  12/13/2020   IR INTRAVSC STENT CERV CAROTID W/EMB-PROT MOD SED INCL ANGIO  12/14/2020   IR INTRAVSC STENT CERV CAROTID W/EMB-PROT MOD SED INCL ANGIO  03/31/2021   IR PERCUTANEOUS ART THROMBECTOMY/INFUSION INTRACRANIAL INC DIAG ANGIO  12/13/2020   IR US GUIDE VASC ACCESS RIGHT  12/13/2020   IR US GUIDE VASC ACCESS RIGHT  03/31/2021   LAPAROSCOPIC RIGHT HEMI COLECTOMY N/A 01/25/2021   Procedure: LAPAROSCOPIC RIGHT COLECTOMY;  Surgeon: Ralene Ok, MD;  Location: [facility];  Service: General;  Laterality: N/A;   RADIOLOGY WITH ANESTHESIA N/A 12/13/2020   Procedure: IR WITH ANESTHESIA;  Surgeon: Luanne Bras, MD;  Location: [place];  Service: Radiology;  Laterality: N/A;   RADIOLOGY WITH ANESTHESIA N/A 03/31/2021   Procedure: STENTING;  Surgeon: [name], MD;  Location: [place];  Service: Radiology;  Laterality: N/A;    Current Medications: Current Meds  Medication Sig   acetaminophen (TYLENOL) 325 MG tablet Take 2 tablets (650 mg total) by mouth every 4 (four) hours as needed for mild pain (or temp > 37.5 C (99.5 F)). (Patient taking differently: Take 1,000 mg by mouth every 4 (four) hours as needed for mild pain (or temp > 37.5 C (99.5 F)).)   aspirin  EC 81 MG tablet Take 81 mg by mouth daily. Swallow whole.     Allergies:   Ibuprofen, Sulfa antibiotics, and Sulfamethoxazole   Social History   Socioeconomic History   Marital status: Single    Spouse name: Not on file   Number of children: Not on file   Years of education: Not on file   Highest education level: Not on file  Occupational History   Not on file  Tobacco Use   Smoking status: Former    Packs/day: 2.00    Years: 48.00    Total pack years: 96.00    Types: Cigarettes    Quit date: 12/13/2020    Years since quitting: 1.7   Smokeless tobacco: Never  Vaping Use   Vaping Use: Never used  Substance and Sexual Activity   Alcohol use: Yes    Comment: weekly - quit 12/13/20   Drug use: Yes  Types: Marijuana, Cocaine    Comment: last use 12/13/20 for both   Sexual activity: Yes  Other Topics Concern   Not on file  Social History Narrative   Not on file   Social Determinants of Health   Financial Resource Strain: Not on file  Food Insecurity: Not on file  Transportation Needs: Not on file  Physical Activity: Not on file  Stress: Not on file  Social Connections: Not on file     Family History: The patient's family history includes Hypertension in his father and mother.  ROS:   Please see the history of present illness.    All other systems reviewed and are negative.  EKGs/Labs/Other Studies Reviewed:    The following studies were reviewed today: EKG reveals sinus rhythm and nonspecific ST-T changes   Recent Labs: 09/11/2022: ALT 17; BUN 17; Creatinine 1.26; Hemoglobin 14.0; Platelet Count 254; Potassium 4.5; Sodium 139  Recent Lipid Panel    Component Value Date/Time   CHOL 179 12/13/2020 0555   TRIG 132 02/07/2021 0622   HDL 54 12/13/2020 0555   CHOLHDL 3.3 12/13/2020 0555   VLDL 16 12/13/2020 0555   LDLCALC 109 (H) 12/13/2020 0555    Physical Exam:    VS:  BP 122/82 (BP Location: Right Arm, Patient Position: Sitting)   Pulse 86   Ht 6'  3" (1.905 m)   Wt 203 lb (92.1 kg)   SpO2 97%   BMI 25.37 kg/m     Wt Readings from Last 3 Encounters:  09/15/22 203 lb (92.1 kg)  09/11/22 202 lb (91.6 kg)  12/01/21 205 lb 0.4 oz (93 kg)     GEN: Patient is in no acute distress HEENT: Normal NECK: No JVD; No carotid bruits LYMPHATICS: No lymphadenopathy CARDIAC: S1 S2 regular, 2/6 systolic murmur at the apex. RESPIRATORY:  Clear to auscultation without rales, wheezing or rhonchi  ABDOMEN: Soft, non-tender, non-distended MUSCULOSKELETAL:  No edema; No deformity  SKIN: Warm and dry NEUROLOGIC:  Alert and oriented x 3 PSYCHIATRIC:  Normal affect    Signed, Jenean Lindau, MD  09/15/2022 2:18 PM    [facility]

## 2022-09-15 NOTE — Patient Instructions (Signed)
Medication Instructions:  Your physician recommends that you continue on your current medications as directed. Please refer to the Current Medication list given to you today.  *If you need a refill on your cardiac medications before your next appointment, please call your pharmacy*   Lab Work: None If you have labs (blood work) drawn today and your tests are completely normal, you will receive your results only by: [place] (if you have MyChart) OR A paper copy in the mail If you have any lab test that is abnormal or we need to change your treatment, we will call you to review the results.   Testing/Procedures: None   Follow-Up: At [facility], you and your health needs are our priority.  As part of our continuing mission to provide you with exceptional heart care, we have created designated Provider Care Teams.  These Care Teams include your primary Cardiologist (physician) and Advanced Practice Providers (APPs -  Physician Assistants and Nurse Practitioners) who all work together to provide you with the care you need, when you need it.  We recommend signing up for the patient portal called "MyChart".  Sign up information is provided on this After Visit Summary.  MyChart is used to connect with patients for Virtual Visits (Telemedicine).  Patients are able to view lab/test results, encounter notes, upcoming appointments, etc.  Non-urgent messages can be sent to your provider as well.   To learn more about what you can do with MyChart, go to [email or web address].    Your next appointment:   Follow up as needed with  The format for your next appointment:   In Person  Provider:   Jyl Heinz, MD    Other Instructions None  Important Information About Sugar

## 2022-09-25 IMAGING — CT CT CHEST-ABD-PELV W/O CM
2 of 4 series · 15 of 46 positions shown, 17 images · non-contrast
Comparison: CT abdomen pelvis [DATE].

CLINICAL DATA: Colorectal cancer.



[Series 2: axial st · axial · 0.90mm/px · z∈[-572,+38]mm · 12 of 136 slices shown, 14 images]
[im 7/136  soft-tissue]
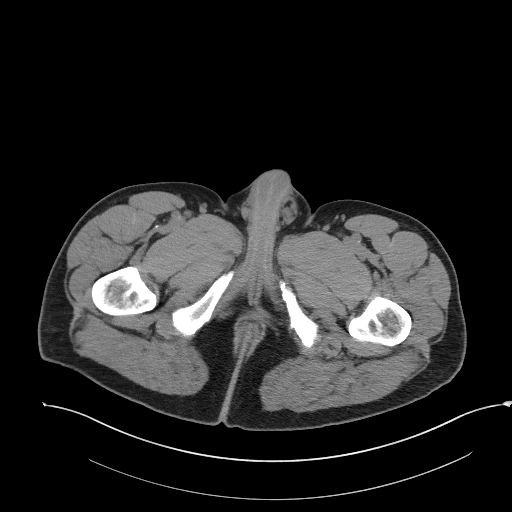
[im 7/136  bone]
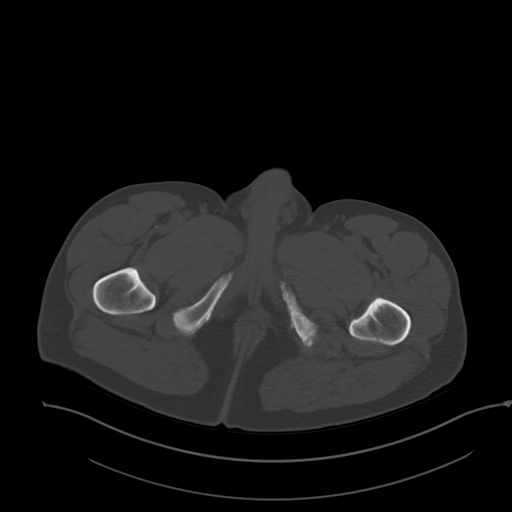
[im 20/136  soft-tissue]
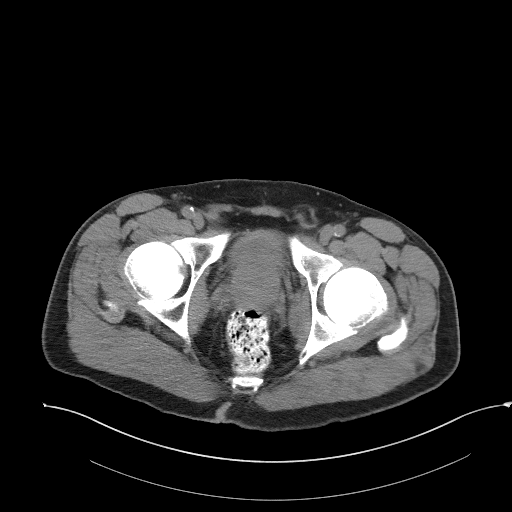
[im 33/136  soft-tissue]
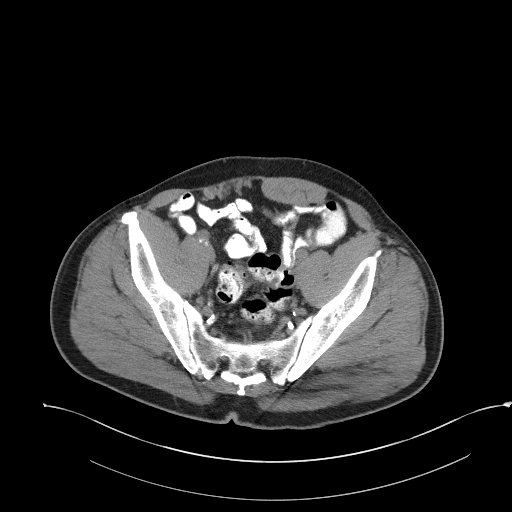
[im 39/136  soft-tissue]
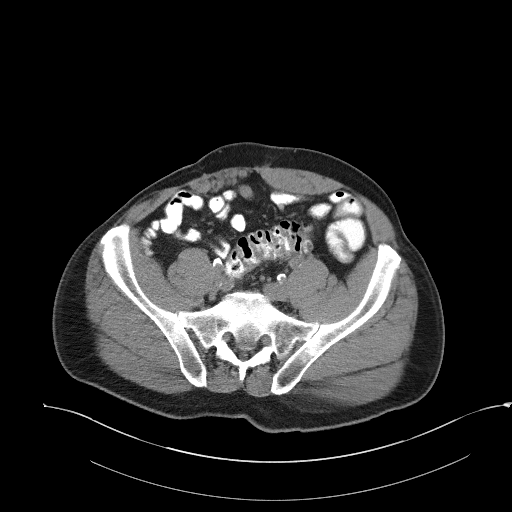
[im 52/136  soft-tissue]
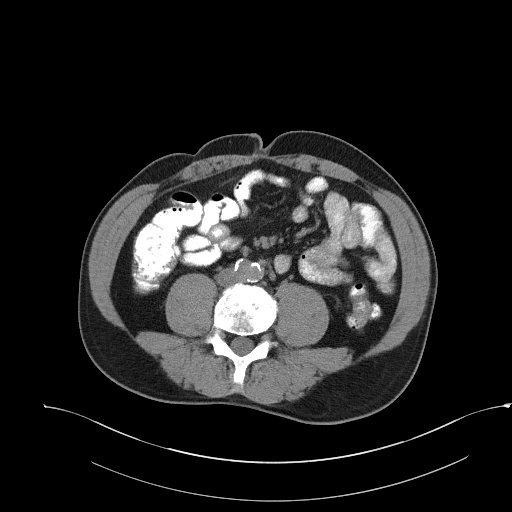
[im 65/136  soft-tissue]
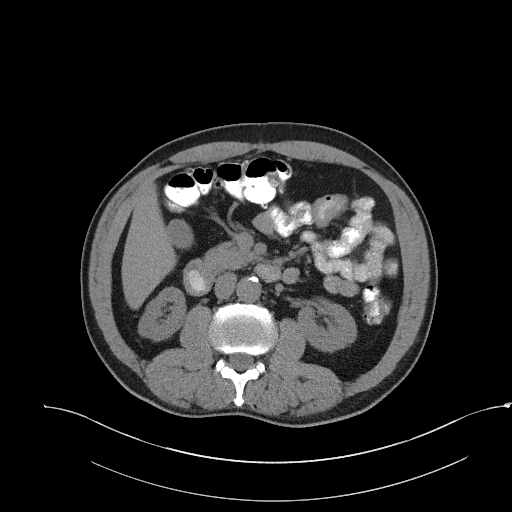
[im 71/136  soft-tissue]
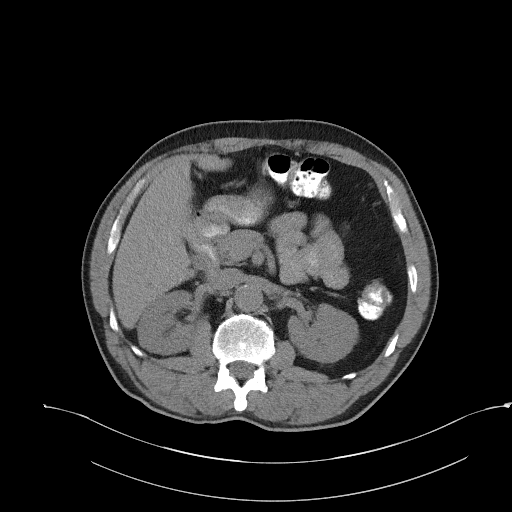
[im 84/136  soft-tissue]
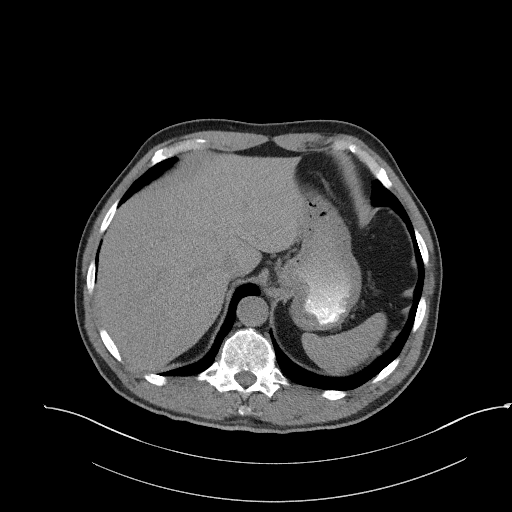
[im 97/136  soft-tissue]
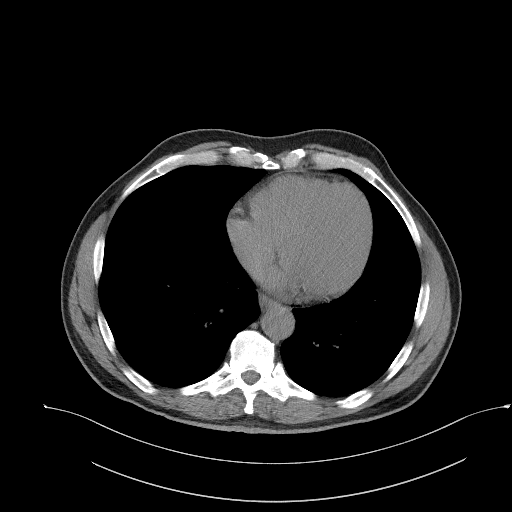
[im 97/136  bone]
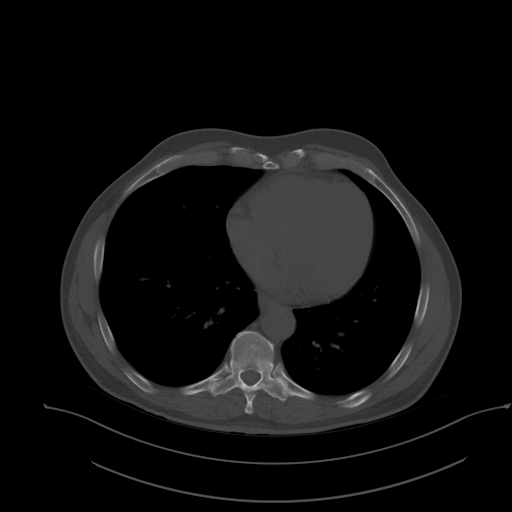
[im 103/136  soft-tissue]
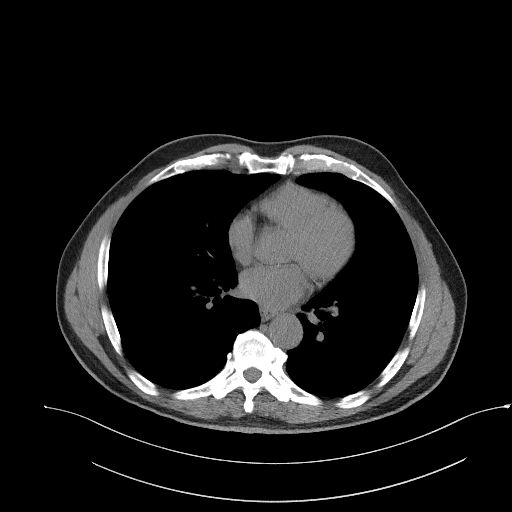
[im 116/136  soft-tissue]
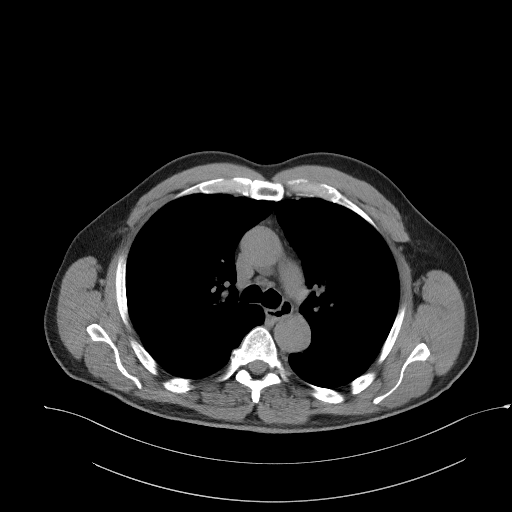
[im 129/136  soft-tissue]
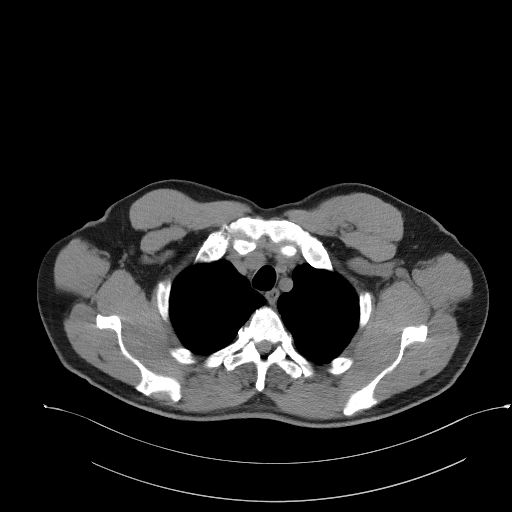

[Series 5: coronal st · coronal · 0.86mm/px · 3 of 98 slices shown]
[im 33/98  soft-tissue]
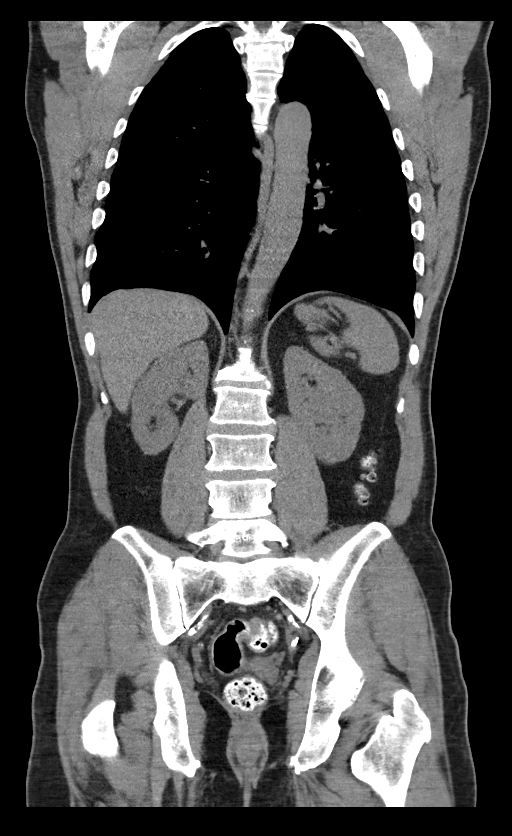
[im 44/98  soft-tissue]
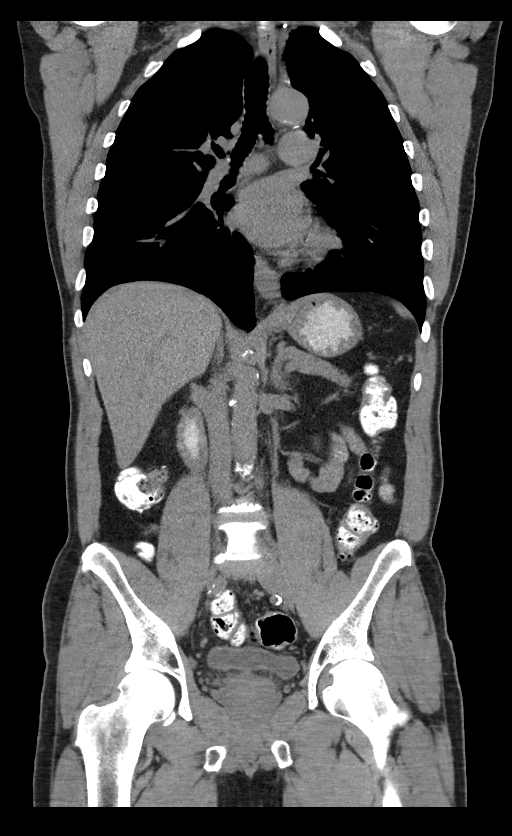
[im 54/98  soft-tissue]
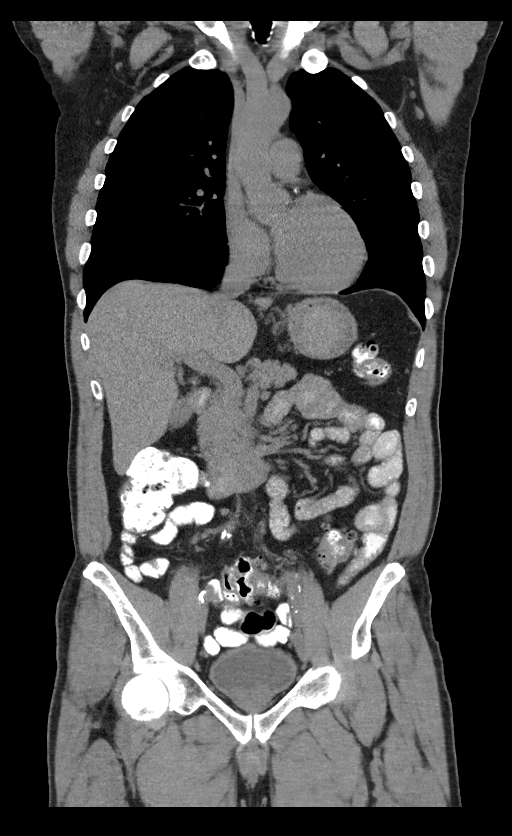

[15 of 46 positions shown; findings below may reference images not displayed]

FINDINGS: CT CHEST FINDINGS

Cardiovascular: Atherosclerotic calcification of the aorta, aortic
valve and coronary arteries. Heart size within normal limits. No
pericardial effusion.

Mediastinum/Nodes: No pathologically enlarged mediastinal or
axillary lymph nodes. Hilar regions are difficult to evaluate
without IV contrast but appear grossly unremarkable. Esophagus is
grossly unremarkable.

Lungs/Pleura: Lungs are clear. No pleural fluid. Airway is
unremarkable.

Musculoskeletal: No worrisome lytic or sclerotic lesions.

CT ABDOMEN PELVIS FINDINGS

Hepatobiliary: Liver and gallbladder are unremarkable. No biliary
ductal dilatation.

Pancreas: Negative.

Spleen: Negative.

Adrenals/Urinary Tract: Adrenal glands and kidneys are unremarkable.
Ureters are decompressed. There may be slight ventral bladder wall
thickening but the bladder is somewhat under distended.

Stomach/Bowel: Stomach and small bowel are unremarkable. Right
hemicolectomy. Colon is otherwise unremarkable.

Vascular/Lymphatic: Atherosclerotic calcification of the aorta. No
pathologically enlarged lymph nodes.

Reproductive: Prostate is enlarged.

Other: Left inguinal hernia contains fat. No free fluid. Mesenteries
and peritoneum are unremarkable. Scarring from prior right lower
quadrant ileostomy.

Musculoskeletal: Degenerative changes in the spine. No worrisome
lytic or sclerotic lesions.
IMPRESSION: 1. Right hemicolectomy. No evidence of recurrent or metastatic
disease.
2. Enlarged prostate. Slight bladder wall thickening is indicative
of an element of outlet obstruction.
3. Aortic atherosclerosis ([id number]). Coronary artery
calcification.

## 2022-10-24 ENCOUNTER — Telehealth (HOSPITAL_COMMUNITY): Payer: Self-pay

## 2022-10-24 NOTE — Telephone Encounter (Signed)
-----   Message from [name], MD sent at 08/09/2022  4:47 PM EDT ----- Hello girls,  Could any of you let Dustin Golden that his carotid ultrasound looks good? He can keep following it up annually. His PCP should be able to order this test. We only need to see him again if in one of the follow-ups the stent appear stenotic (> 70%) or if he develops symptoms.  Thanks, [employer]

## 2022-12-27 ENCOUNTER — Inpatient Hospital Stay: Payer: Medicare Other | Admitting: Hematology & Oncology

## 2022-12-27 ENCOUNTER — Inpatient Hospital Stay: Payer: Medicare Other

## 2024-04-12 ENCOUNTER — Encounter (HOSPITAL_COMMUNITY): Payer: Self-pay | Admitting: Interventional Radiology

## 2024-11-04 ENCOUNTER — Encounter: Payer: Self-pay | Admitting: Hematology & Oncology
